# Patient Record
Sex: Female | Born: 1935 | Race: White | Hispanic: No | Smoking: Former smoker
Health system: Southern US, Community
[De-identification: ages and names within clinical notes are randomized; demographics above are authoritative.]

## PROBLEM LIST (undated history)

## (undated) DIAGNOSIS — J449 Chronic obstructive pulmonary disease, unspecified: Secondary | ICD-10-CM

## (undated) DIAGNOSIS — K5090 Crohn's disease, unspecified, without complications: Secondary | ICD-10-CM

## (undated) DIAGNOSIS — D649 Anemia, unspecified: Secondary | ICD-10-CM

## (undated) DIAGNOSIS — J45909 Unspecified asthma, uncomplicated: Secondary | ICD-10-CM

## (undated) DIAGNOSIS — I219 Acute myocardial infarction, unspecified: Secondary | ICD-10-CM

## (undated) DIAGNOSIS — K279 Peptic ulcer, site unspecified, unspecified as acute or chronic, without hemorrhage or perforation: Secondary | ICD-10-CM

## (undated) DIAGNOSIS — I509 Heart failure, unspecified: Secondary | ICD-10-CM

## (undated) DIAGNOSIS — I10 Essential (primary) hypertension: Secondary | ICD-10-CM

## (undated) DIAGNOSIS — F32A Depression, unspecified: Secondary | ICD-10-CM

## (undated) DIAGNOSIS — E785 Hyperlipidemia, unspecified: Secondary | ICD-10-CM

## (undated) DIAGNOSIS — R112 Nausea with vomiting, unspecified: Secondary | ICD-10-CM

## (undated) DIAGNOSIS — F329 Major depressive disorder, single episode, unspecified: Secondary | ICD-10-CM

## (undated) DIAGNOSIS — T4145XA Adverse effect of unspecified anesthetic, initial encounter: Secondary | ICD-10-CM

## (undated) DIAGNOSIS — M1990 Unspecified osteoarthritis, unspecified site: Secondary | ICD-10-CM

## (undated) DIAGNOSIS — T8859XA Other complications of anesthesia, initial encounter: Secondary | ICD-10-CM

## (undated) DIAGNOSIS — M8580 Other specified disorders of bone density and structure, unspecified site: Secondary | ICD-10-CM

## (undated) DIAGNOSIS — Z9889 Other specified postprocedural states: Secondary | ICD-10-CM

## (undated) DIAGNOSIS — J189 Pneumonia, unspecified organism: Secondary | ICD-10-CM

## (undated) HISTORY — DX: Pneumonia, unspecified organism: J18.9

## (undated) HISTORY — PX: EYE SURGERY: SHX253

## (undated) HISTORY — DX: Unspecified asthma, uncomplicated: J45.909

## (undated) HISTORY — DX: Unspecified osteoarthritis, unspecified site: M19.90

## (undated) HISTORY — DX: Crohn's disease, unspecified, without complications: K50.90

## (undated) HISTORY — PX: SHOULDER SURGERY: SHX246

## (undated) HISTORY — PX: ABDOMINAL SURGERY: SHX537

## (undated) HISTORY — PX: SMALL INTESTINE SURGERY: SHX150

## (undated) HISTORY — DX: Chronic obstructive pulmonary disease, unspecified: J44.9

## (undated) HISTORY — PX: CARDIAC CATHETERIZATION: SHX172

## (undated) HISTORY — DX: Heart failure, unspecified: I50.9

---

## 2004-02-06 ENCOUNTER — Ambulatory Visit: Payer: Self-pay | Admitting: Rheumatology

## 2004-05-05 ENCOUNTER — Other Ambulatory Visit: Payer: Self-pay

## 2004-05-05 ENCOUNTER — Ambulatory Visit: Payer: Self-pay | Admitting: General Practice

## 2004-05-08 ENCOUNTER — Ambulatory Visit: Payer: Self-pay | Admitting: General Practice

## 2004-05-09 ENCOUNTER — Ambulatory Visit: Payer: Self-pay | Admitting: Internal Medicine

## 2004-05-12 ENCOUNTER — Inpatient Hospital Stay: Payer: Self-pay | Admitting: General Practice

## 2004-08-31 ENCOUNTER — Other Ambulatory Visit: Payer: Self-pay

## 2004-08-31 ENCOUNTER — Inpatient Hospital Stay: Payer: Self-pay | Admitting: Internal Medicine

## 2004-11-17 ENCOUNTER — Ambulatory Visit: Payer: Self-pay | Admitting: Internal Medicine

## 2004-12-02 ENCOUNTER — Ambulatory Visit: Payer: Self-pay | Admitting: Obstetrics and Gynecology

## 2005-12-03 ENCOUNTER — Ambulatory Visit: Payer: Self-pay | Admitting: Obstetrics and Gynecology

## 2006-03-05 ENCOUNTER — Ambulatory Visit: Payer: Self-pay | Admitting: Gastroenterology

## 2006-06-09 ENCOUNTER — Observation Stay: Payer: Self-pay | Admitting: Gastroenterology

## 2007-03-30 ENCOUNTER — Other Ambulatory Visit: Payer: Self-pay

## 2007-03-31 ENCOUNTER — Inpatient Hospital Stay: Payer: Self-pay | Admitting: Internal Medicine

## 2007-05-27 ENCOUNTER — Inpatient Hospital Stay: Payer: Self-pay | Admitting: Internal Medicine

## 2008-02-06 ENCOUNTER — Ambulatory Visit: Payer: Self-pay | Admitting: Internal Medicine

## 2008-03-15 ENCOUNTER — Ambulatory Visit: Payer: Self-pay | Admitting: Internal Medicine

## 2008-10-10 ENCOUNTER — Ambulatory Visit: Payer: Self-pay | Admitting: Gastroenterology

## 2009-10-14 ENCOUNTER — Ambulatory Visit: Payer: Self-pay | Admitting: Specialist

## 2009-10-17 ENCOUNTER — Ambulatory Visit: Payer: Self-pay | Admitting: Rheumatology

## 2009-10-22 ENCOUNTER — Ambulatory Visit: Payer: Self-pay | Admitting: Gastroenterology

## 2009-10-29 ENCOUNTER — Inpatient Hospital Stay: Payer: Self-pay | Admitting: Internal Medicine

## 2010-04-29 ENCOUNTER — Ambulatory Visit: Payer: Self-pay | Admitting: Specialist

## 2010-05-09 ENCOUNTER — Ambulatory Visit: Payer: Self-pay | Admitting: Obstetrics and Gynecology

## 2010-05-17 ENCOUNTER — Emergency Department: Payer: Self-pay | Admitting: Unknown Physician Specialty

## 2010-05-28 ENCOUNTER — Ambulatory Visit: Payer: Self-pay | Admitting: Internal Medicine

## 2010-07-28 ENCOUNTER — Inpatient Hospital Stay: Payer: Self-pay | Admitting: Internal Medicine

## 2010-12-08 ENCOUNTER — Other Ambulatory Visit: Payer: Self-pay | Admitting: Gastroenterology

## 2011-01-13 ENCOUNTER — Ambulatory Visit: Payer: Self-pay | Admitting: Rheumatology

## 2011-02-10 ENCOUNTER — Ambulatory Visit: Payer: Self-pay | Admitting: General Practice

## 2011-03-31 ENCOUNTER — Ambulatory Visit: Payer: Self-pay | Admitting: Specialist

## 2011-04-01 LAB — CBC WITH DIFFERENTIAL/PLATELET
Basophil #: 0.1 10*3/uL (ref 0.0–0.1)
Basophil %: 0.9 %
Eosinophil #: 0.2 10*3/uL (ref 0.0–0.7)
Eosinophil %: 2.2 %
HCT: 37.3 % (ref 35.0–47.0)
HGB: 12.3 g/dL (ref 12.0–16.0)
Lymphocyte #: 0.9 10*3/uL — ABNORMAL LOW (ref 1.0–3.6)
Lymphocyte %: 11 %
MCH: 29.2 pg (ref 26.0–34.0)
MCHC: 33 g/dL (ref 32.0–36.0)
MCV: 89 fL (ref 80–100)
Monocyte #: 0.4 10*3/uL (ref 0.0–0.7)
Monocyte %: 4.8 %
Neutrophil #: 7 10*3/uL — ABNORMAL HIGH (ref 1.4–6.5)
Neutrophil %: 81.1 %
Platelet: 267 10*3/uL (ref 150–440)
RBC: 4.22 10*6/uL (ref 3.80–5.20)
RDW: 15.1 % — ABNORMAL HIGH (ref 11.5–14.5)
WBC: 8.6 10*3/uL (ref 3.6–11.0)

## 2011-04-02 ENCOUNTER — Inpatient Hospital Stay: Payer: Self-pay | Admitting: General Practice

## 2011-04-22 ENCOUNTER — Ambulatory Visit: Payer: Self-pay | Admitting: Specialist

## 2011-08-28 ENCOUNTER — Inpatient Hospital Stay: Payer: Self-pay | Admitting: Internal Medicine

## 2011-08-28 LAB — COMPREHENSIVE METABOLIC PANEL
Albumin: 3.1 g/dL — ABNORMAL LOW (ref 3.4–5.0)
Alkaline Phosphatase: 94 U/L (ref 50–136)
Anion Gap: 9 (ref 7–16)
BUN: 37 mg/dL — ABNORMAL HIGH (ref 7–18)
Bilirubin,Total: 0.3 mg/dL (ref 0.2–1.0)
Calcium, Total: 8.6 mg/dL (ref 8.5–10.1)
Chloride: 99 mmol/L (ref 98–107)
Co2: 27 mmol/L (ref 21–32)
Creatinine: 2.36 mg/dL — ABNORMAL HIGH (ref 0.60–1.30)
EGFR (African American): 23 — ABNORMAL LOW
EGFR (Non-African Amer.): 20 — ABNORMAL LOW
Glucose: 115 mg/dL — ABNORMAL HIGH (ref 65–99)
Osmolality: 280 (ref 275–301)
Potassium: 3.6 mmol/L (ref 3.5–5.1)
SGOT(AST): 22 U/L (ref 15–37)
SGPT (ALT): 18 U/L
Sodium: 135 mmol/L — ABNORMAL LOW (ref 136–145)
Total Protein: 7.1 g/dL (ref 6.4–8.2)

## 2011-08-28 LAB — CBC WITH DIFFERENTIAL/PLATELET
Basophil #: 0.2 10*3/uL — ABNORMAL HIGH (ref 0.0–0.1)
Basophil %: 1.8 %
Eosinophil #: 0.2 10*3/uL (ref 0.0–0.7)
Eosinophil %: 1.4 %
HCT: 35.9 % (ref 35.0–47.0)
HGB: 11.5 g/dL — ABNORMAL LOW (ref 12.0–16.0)
Lymphocyte #: 1.7 10*3/uL (ref 1.0–3.6)
Lymphocyte %: 12.1 %
MCH: 28 pg (ref 26.0–34.0)
MCHC: 31.9 g/dL — ABNORMAL LOW (ref 32.0–36.0)
MCV: 88 fL (ref 80–100)
Monocyte #: 1.5 x10 3/mm — ABNORMAL HIGH (ref 0.2–0.9)
Monocyte %: 10.9 %
Neutrophil #: 10.2 10*3/uL — ABNORMAL HIGH (ref 1.4–6.5)
Neutrophil %: 73.8 %
Platelet: 333 10*3/uL (ref 150–440)
RBC: 4.09 10*6/uL (ref 3.80–5.20)
RDW: 15 % — ABNORMAL HIGH (ref 11.5–14.5)
WBC: 13.9 10*3/uL — ABNORMAL HIGH (ref 3.6–11.0)

## 2011-08-28 LAB — URINALYSIS, COMPLETE
Bacteria: NONE SEEN
Bilirubin,UR: NEGATIVE
Blood: NEGATIVE
Glucose,UR: NEGATIVE mg/dL (ref 0–75)
Granular Cast: 6
Ketone: NEGATIVE
Leukocyte Esterase: NEGATIVE
Nitrite: NEGATIVE
Ph: 5 (ref 4.5–8.0)
Protein: NEGATIVE
RBC,UR: NONE SEEN /HPF (ref 0–5)
Specific Gravity: 1.01 (ref 1.003–1.030)
Squamous Epithelial: 1
WBC UR: 1 /HPF (ref 0–5)

## 2011-08-28 LAB — LIPASE, BLOOD: Lipase: 85 U/L (ref 73–393)

## 2011-08-29 LAB — BASIC METABOLIC PANEL
Anion Gap: 9 (ref 7–16)
BUN: 29 mg/dL — ABNORMAL HIGH (ref 7–18)
Calcium, Total: 8.2 mg/dL — ABNORMAL LOW (ref 8.5–10.1)
Chloride: 102 mmol/L (ref 98–107)
Co2: 26 mmol/L (ref 21–32)
Creatinine: 1.69 mg/dL — ABNORMAL HIGH (ref 0.60–1.30)
EGFR (African American): 34 — ABNORMAL LOW
EGFR (Non-African Amer.): 29 — ABNORMAL LOW
Glucose: 135 mg/dL — ABNORMAL HIGH (ref 65–99)
Osmolality: 282 (ref 275–301)
Potassium: 3.7 mmol/L (ref 3.5–5.1)
Sodium: 137 mmol/L (ref 136–145)

## 2011-08-29 LAB — CBC WITH DIFFERENTIAL/PLATELET
Basophil #: 0 10*3/uL (ref 0.0–0.1)
Basophil %: 0.2 %
Eosinophil #: 0 10*3/uL (ref 0.0–0.7)
Eosinophil %: 0.1 %
HCT: 30.3 % — ABNORMAL LOW (ref 35.0–47.0)
HGB: 10 g/dL — ABNORMAL LOW (ref 12.0–16.0)
Lymphocyte #: 0.6 10*3/uL — ABNORMAL LOW (ref 1.0–3.6)
Lymphocyte %: 6.3 %
MCH: 29 pg (ref 26.0–34.0)
MCHC: 32.9 g/dL (ref 32.0–36.0)
MCV: 88 fL (ref 80–100)
Monocyte #: 0.6 x10 3/mm (ref 0.2–0.9)
Monocyte %: 6.6 %
Neutrophil #: 7.7 10*3/uL — ABNORMAL HIGH (ref 1.4–6.5)
Neutrophil %: 86.8 %
Platelet: 290 10*3/uL (ref 150–440)
RBC: 3.44 10*6/uL — ABNORMAL LOW (ref 3.80–5.20)
RDW: 14.5 % (ref 11.5–14.5)
WBC: 8.9 10*3/uL (ref 3.6–11.0)

## 2011-08-29 LAB — MAGNESIUM: Magnesium: 1.8 mg/dL

## 2011-08-30 LAB — BASIC METABOLIC PANEL
Anion Gap: 10 (ref 7–16)
BUN: 13 mg/dL (ref 7–18)
Calcium, Total: 8.1 mg/dL — ABNORMAL LOW (ref 8.5–10.1)
Chloride: 109 mmol/L — ABNORMAL HIGH (ref 98–107)
Co2: 26 mmol/L (ref 21–32)
Creatinine: 0.85 mg/dL (ref 0.60–1.30)
EGFR (African American): >60
EGFR (Non-African Amer.): >60
Glucose: 125 mg/dL — ABNORMAL HIGH (ref 65–99)
Osmolality: 290 (ref 275–301)
Potassium: 3.9 mmol/L (ref 3.5–5.1)
Sodium: 145 mmol/L (ref 136–145)

## 2011-08-30 LAB — CBC WITH DIFFERENTIAL/PLATELET
Basophil #: 0 10*3/uL (ref 0.0–0.1)
Basophil %: 0.1 %
Eosinophil #: 0 10*3/uL (ref 0.0–0.7)
Eosinophil %: 0.1 %
HCT: 28.3 % — ABNORMAL LOW (ref 35.0–47.0)
HGB: 9.1 g/dL — ABNORMAL LOW (ref 12.0–16.0)
Lymphocyte #: 0.8 10*3/uL — ABNORMAL LOW (ref 1.0–3.6)
Lymphocyte %: 10.9 %
MCH: 28.2 pg (ref 26.0–34.0)
MCHC: 32 g/dL (ref 32.0–36.0)
MCV: 88 fL (ref 80–100)
Monocyte #: 0.7 x10 3/mm (ref 0.2–0.9)
Monocyte %: 9.2 %
Neutrophil #: 6 10*3/uL (ref 1.4–6.5)
Neutrophil %: 79.7 %
Platelet: 267 10*3/uL (ref 150–440)
RBC: 3.21 10*6/uL — ABNORMAL LOW (ref 3.80–5.20)
RDW: 14.6 % — ABNORMAL HIGH (ref 11.5–14.5)
WBC: 7.5 10*3/uL (ref 3.6–11.0)

## 2011-08-31 LAB — BASIC METABOLIC PANEL
Anion Gap: 7 (ref 7–16)
BUN: 14 mg/dL (ref 7–18)
Calcium, Total: 8.3 mg/dL — ABNORMAL LOW (ref 8.5–10.1)
Chloride: 111 mmol/L — ABNORMAL HIGH (ref 98–107)
Co2: 26 mmol/L (ref 21–32)
Creatinine: 1.02 mg/dL (ref 0.60–1.30)
EGFR (African American): >60
EGFR (Non-African Amer.): 54 — ABNORMAL LOW
Glucose: 134 mg/dL — ABNORMAL HIGH (ref 65–99)
Osmolality: 289 (ref 275–301)
Potassium: 3.9 mmol/L (ref 3.5–5.1)
Sodium: 144 mmol/L (ref 136–145)

## 2011-08-31 LAB — CBC WITH DIFFERENTIAL/PLATELET
Basophil #: 0 10*3/uL (ref 0.0–0.1)
Basophil %: 0.1 %
Eosinophil #: 0 10*3/uL (ref 0.0–0.7)
Eosinophil %: 0.1 %
HCT: 28.4 % — ABNORMAL LOW (ref 35.0–47.0)
HGB: 9.2 g/dL — ABNORMAL LOW (ref 12.0–16.0)
Lymphocyte #: 0.9 10*3/uL — ABNORMAL LOW (ref 1.0–3.6)
Lymphocyte %: 12.7 %
MCH: 28.6 pg (ref 26.0–34.0)
MCHC: 32.4 g/dL (ref 32.0–36.0)
MCV: 88 fL (ref 80–100)
Monocyte #: 0.5 x10 3/mm (ref 0.2–0.9)
Monocyte %: 6.6 %
Neutrophil #: 5.7 10*3/uL (ref 1.4–6.5)
Neutrophil %: 80.5 %
Platelet: 260 10*3/uL (ref 150–440)
RBC: 3.21 10*6/uL — ABNORMAL LOW (ref 3.80–5.20)
RDW: 14.5 % (ref 11.5–14.5)
WBC: 7.1 10*3/uL (ref 3.6–11.0)

## 2011-09-02 LAB — CULTURE, BLOOD (SINGLE)

## 2011-09-25 ENCOUNTER — Other Ambulatory Visit: Payer: Self-pay | Admitting: Internal Medicine

## 2011-10-01 LAB — CULTURE, BLOOD (SINGLE)

## 2012-01-13 ENCOUNTER — Ambulatory Visit: Payer: Self-pay | Admitting: Gastroenterology

## 2012-01-14 LAB — PATHOLOGY REPORT

## 2012-04-01 ENCOUNTER — Emergency Department: Payer: Self-pay | Admitting: Emergency Medicine

## 2012-04-01 LAB — COMPREHENSIVE METABOLIC PANEL
Albumin: 3.7 g/dL (ref 3.4–5.0)
Alkaline Phosphatase: 109 U/L (ref 50–136)
Anion Gap: 9 (ref 7–16)
BUN: 17 mg/dL (ref 7–18)
Bilirubin,Total: 0.3 mg/dL (ref 0.2–1.0)
Calcium, Total: 9.1 mg/dL (ref 8.5–10.1)
Chloride: 105 mmol/L (ref 98–107)
Co2: 25 mmol/L (ref 21–32)
Creatinine: 0.78 mg/dL (ref 0.60–1.30)
EGFR (African American): >60
EGFR (Non-African Amer.): >60
Glucose: 145 mg/dL — ABNORMAL HIGH (ref 65–99)
Osmolality: 282 (ref 275–301)
Potassium: 3.7 mmol/L (ref 3.5–5.1)
SGOT(AST): 19 U/L (ref 15–37)
SGPT (ALT): 24 U/L (ref 12–78)
Sodium: 139 mmol/L (ref 136–145)
Total Protein: 7.3 g/dL (ref 6.4–8.2)

## 2012-04-01 LAB — CBC WITH DIFFERENTIAL/PLATELET
Basophil #: 0.1 10*3/uL (ref 0.0–0.1)
Basophil %: 0.5 %
Eosinophil #: 0.1 10*3/uL (ref 0.0–0.7)
Eosinophil %: 0.7 %
HCT: 43.2 % (ref 35.0–47.0)
HGB: 14.3 g/dL (ref 12.0–16.0)
Lymphocyte #: 0.9 10*3/uL — ABNORMAL LOW (ref 1.0–3.6)
Lymphocyte %: 7 %
MCH: 28.6 pg (ref 26.0–34.0)
MCHC: 33 g/dL (ref 32.0–36.0)
MCV: 87 fL (ref 80–100)
Monocyte #: 1 x10 3/mm — ABNORMAL HIGH (ref 0.2–0.9)
Monocyte %: 7.9 %
Neutrophil #: 10.9 10*3/uL — ABNORMAL HIGH (ref 1.4–6.5)
Neutrophil %: 83.9 %
Platelet: 266 10*3/uL (ref 150–440)
RBC: 5 10*6/uL (ref 3.80–5.20)
RDW: 15.2 % — ABNORMAL HIGH (ref 11.5–14.5)
WBC: 13 10*3/uL — ABNORMAL HIGH (ref 3.6–11.0)

## 2012-04-01 LAB — TROPONIN I: Troponin-I: <0.02 ng/mL

## 2012-04-01 LAB — LIPASE, BLOOD: Lipase: 90 U/L (ref 73–393)

## 2012-04-28 ENCOUNTER — Ambulatory Visit: Payer: Self-pay | Admitting: Surgery

## 2012-05-06 ENCOUNTER — Inpatient Hospital Stay: Payer: Self-pay | Admitting: Surgery

## 2012-05-07 LAB — PLATELET COUNT: Platelet: 198 10*3/uL (ref 150–440)

## 2012-05-08 LAB — HEMOGLOBIN: HGB: 12.5 g/dL (ref 12.0–16.0)

## 2012-05-09 LAB — PATHOLOGY REPORT

## 2012-05-12 LAB — BASIC METABOLIC PANEL
Anion Gap: 6 — ABNORMAL LOW (ref 7–16)
BUN: 15 mg/dL (ref 7–18)
Calcium, Total: 8.2 mg/dL — ABNORMAL LOW (ref 8.5–10.1)
Chloride: 102 mmol/L (ref 98–107)
Co2: 25 mmol/L (ref 21–32)
Creatinine: 1.47 mg/dL — ABNORMAL HIGH (ref 0.60–1.30)
EGFR (African American): 40 — ABNORMAL LOW
EGFR (Non-African Amer.): 34 — ABNORMAL LOW
Glucose: 135 mg/dL — ABNORMAL HIGH (ref 65–99)
Osmolality: 269 (ref 275–301)
Potassium: 4 mmol/L (ref 3.5–5.1)
Sodium: 133 mmol/L — ABNORMAL LOW (ref 136–145)

## 2012-05-12 LAB — PLATELET COUNT: Platelet: 258 10*3/uL (ref 150–440)

## 2012-05-12 LAB — CBC WITH DIFFERENTIAL/PLATELET
Bands: 3 %
Basophil: 1 %
Eosinophil: 1 %
HCT: 35.4 % (ref 35.0–47.0)
HGB: 11.8 g/dL — ABNORMAL LOW (ref 12.0–16.0)
Lymphocytes: 18 %
MCH: 29.4 pg (ref 26.0–34.0)
MCHC: 33.3 g/dL (ref 32.0–36.0)
MCV: 88 fL (ref 80–100)
Metamyelocyte: 4 %
Monocytes: 6 %
Myelocyte: 3 %
RBC: 4.01 10*6/uL (ref 3.80–5.20)
RDW: 15.1 % — ABNORMAL HIGH (ref 11.5–14.5)
Segmented Neutrophils: 61 %
Variant Lymphocyte - H1-Rlymph: 3 %
WBC: 11.5 10*3/uL — ABNORMAL HIGH (ref 3.6–11.0)

## 2012-05-13 LAB — CBC WITH DIFFERENTIAL/PLATELET
Bands: 1 %
HCT: 35.1 % (ref 35.0–47.0)
HGB: 11.6 g/dL — ABNORMAL LOW (ref 12.0–16.0)
Lymphocytes: 15 %
MCH: 28.9 pg (ref 26.0–34.0)
MCHC: 33.1 g/dL (ref 32.0–36.0)
MCV: 87 fL (ref 80–100)
Metamyelocyte: 3 %
Monocytes: 7 %
Platelet: 241 10*3/uL (ref 150–440)
RBC: 4.03 10*6/uL (ref 3.80–5.20)
RDW: 15.5 % — ABNORMAL HIGH (ref 11.5–14.5)
Segmented Neutrophils: 74 %
WBC: 13.8 10*3/uL — ABNORMAL HIGH (ref 3.6–11.0)

## 2012-05-15 LAB — BASIC METABOLIC PANEL
Anion Gap: 9 (ref 7–16)
BUN: 10 mg/dL (ref 7–18)
Calcium, Total: 8 mg/dL — ABNORMAL LOW (ref 8.5–10.1)
Chloride: 98 mmol/L (ref 98–107)
Co2: 23 mmol/L (ref 21–32)
Creatinine: 1.32 mg/dL — ABNORMAL HIGH (ref 0.60–1.30)
EGFR (African American): 45 — ABNORMAL LOW
EGFR (Non-African Amer.): 39 — ABNORMAL LOW
Glucose: 119 mg/dL — ABNORMAL HIGH (ref 65–99)
Osmolality: 261 (ref 275–301)
Potassium: 4.6 mmol/L (ref 3.5–5.1)
Sodium: 130 mmol/L — ABNORMAL LOW (ref 136–145)

## 2012-05-15 LAB — CBC WITH DIFFERENTIAL/PLATELET
Bands: 8 %
Eosinophil: 2 %
HCT: 32.8 % — ABNORMAL LOW (ref 35.0–47.0)
HGB: 11.1 g/dL — ABNORMAL LOW (ref 12.0–16.0)
Lymphocytes: 10 %
MCH: 29.7 pg (ref 26.0–34.0)
MCHC: 33.7 g/dL (ref 32.0–36.0)
MCV: 88 fL (ref 80–100)
Metamyelocyte: 2 %
Monocytes: 11 %
Myelocyte: 2 %
Other Cells Blood: 1
Platelet: 258 10*3/uL (ref 150–440)
RBC: 3.73 10*6/uL — ABNORMAL LOW (ref 3.80–5.20)
RDW: 15.1 % — ABNORMAL HIGH (ref 11.5–14.5)
Segmented Neutrophils: 59 %
Variant Lymphocyte - H1-Rlymph: 5 %
WBC: 10.4 10*3/uL (ref 3.6–11.0)

## 2012-05-15 LAB — CLOSTRIDIUM DIFFICILE BY PCR

## 2012-05-16 LAB — CBC WITH DIFFERENTIAL/PLATELET
Bands: 6 %
HCT: 32 % — ABNORMAL LOW (ref 35.0–47.0)
HGB: 10.6 g/dL — ABNORMAL LOW (ref 12.0–16.0)
Lymphocytes: 18 %
MCH: 29 pg (ref 26.0–34.0)
MCHC: 33.2 g/dL (ref 32.0–36.0)
MCV: 88 fL (ref 80–100)
Metamyelocyte: 2 %
Monocytes: 11 %
Myelocyte: 2 %
Platelet: 274 10*3/uL (ref 150–440)
RBC: 3.65 10*6/uL — ABNORMAL LOW (ref 3.80–5.20)
RDW: 14.9 % — ABNORMAL HIGH (ref 11.5–14.5)
Segmented Neutrophils: 59 %
Variant Lymphocyte - H1-Rlymph: 2 %
WBC: 8.3 10*3/uL (ref 3.6–11.0)

## 2012-05-18 LAB — BASIC METABOLIC PANEL
Anion Gap: 7 (ref 7–16)
BUN: 12 mg/dL (ref 7–18)
Calcium, Total: 8.3 mg/dL — ABNORMAL LOW (ref 8.5–10.1)
Chloride: 101 mmol/L (ref 98–107)
Co2: 25 mmol/L (ref 21–32)
Creatinine: 1.51 mg/dL — ABNORMAL HIGH (ref 0.60–1.30)
EGFR (African American): 39 — ABNORMAL LOW
EGFR (Non-African Amer.): 33 — ABNORMAL LOW
Glucose: 95 mg/dL (ref 65–99)
Osmolality: 266 (ref 275–301)
Potassium: 4 mmol/L (ref 3.5–5.1)
Sodium: 133 mmol/L — ABNORMAL LOW (ref 136–145)

## 2012-10-19 ENCOUNTER — Other Ambulatory Visit: Payer: Self-pay | Admitting: Specialist

## 2012-10-20 ENCOUNTER — Ambulatory Visit: Payer: Self-pay | Admitting: Specialist

## 2013-03-14 ENCOUNTER — Ambulatory Visit: Payer: Self-pay

## 2013-03-29 ENCOUNTER — Ambulatory Visit: Payer: Self-pay | Admitting: Internal Medicine

## 2013-04-01 LAB — BRONCHIAL WASH CULTURE

## 2013-04-19 LAB — CULTURE, FUNGUS WITHOUT SMEAR

## 2013-05-22 ENCOUNTER — Encounter: Payer: Self-pay | Admitting: Podiatry

## 2013-05-22 ENCOUNTER — Ambulatory Visit (INDEPENDENT_AMBULATORY_CARE_PROVIDER_SITE_OTHER): Payer: Medicare Other | Admitting: Podiatry

## 2013-05-22 ENCOUNTER — Ambulatory Visit (INDEPENDENT_AMBULATORY_CARE_PROVIDER_SITE_OTHER): Payer: Medicare Other

## 2013-05-22 VITALS — BP 135/82 | HR 103 | Resp 16 | Ht 60.0 in | Wt 173.0 lb

## 2013-05-22 DIAGNOSIS — M79609 Pain in unspecified limb: Secondary | ICD-10-CM

## 2013-05-22 DIAGNOSIS — M778 Other enthesopathies, not elsewhere classified: Secondary | ICD-10-CM

## 2013-05-22 DIAGNOSIS — M79673 Pain in unspecified foot: Secondary | ICD-10-CM

## 2013-05-22 DIAGNOSIS — M7750 Other enthesopathy of unspecified foot: Secondary | ICD-10-CM

## 2013-05-22 DIAGNOSIS — M722 Plantar fascial fibromatosis: Secondary | ICD-10-CM

## 2013-05-22 DIAGNOSIS — M779 Enthesopathy, unspecified: Secondary | ICD-10-CM

## 2013-05-22 NOTE — Progress Notes (Signed)
   Subjective:    Patient ID: Raven [name], female    DOB: [birth year], 79 y.o.   MRN: [id number]  HPI Comments: i have right heel pain. Its been going on for 3 -4 weeks, but its been off and on for 12-14 yrs. The pain has gotten worse and it hurts to walk. i rub it with arthritis cream.   i have athletes foot on my right foot only. Its been off and on for 21 yrs. i use athletes foot spray and cortisone cream. It goes away and comes back.   i have pain on the ball of my left foot. It hurts when i walk. Its a sharp pain. The pain has been going on for the last couple of weeks. i dont do anything for my left foot.  Foot Pain      Review of Systems  Constitutional: Negative.   HENT: Negative.   Eyes: Negative.   Respiratory: Positive for shortness of breath and wheezing.   Cardiovascular: Positive for leg swelling.       Calf pain when walking  Gastrointestinal: Negative.   Endocrine: Negative.   Genitourinary: Negative.   Musculoskeletal:       Joint pain Difficulty walking Muscle pain  Skin:       Chang in nails  Allergic/Immunologic: Negative.   Neurological: Negative.   Hematological: Negative.   Psychiatric/Behavioral: Negative.        Objective:   Physical Exam: I have reviewed her past history medications allergies surgeries social history. Vital signs are stable she is alert and oriented x3. Pulses are strongly palpable bilateral. Neurologic sensorium is intact per since once the monofilament. Deep tendon reflexes are intact bilateral muscle strength is 5 over 5 dorsiflexors plantar fasciitis inverters everters all intrinsic musculature is intact orthopedic evaluation Mr. is all joints distal to the ankle a full range of motion without crepitus. She has pain on palpation medial continued tubercle of the right heel. And pain on palpation with end range of motion of the second metatarsophalangeal joint of the left foot. Radiographic evaluation does demonstrate a soft tissue  increase in density at the plantar fascial calcaneal insertion site of the right foot. Plantar distally oriented calcaneal heel spurs noted small retrocalcaneal heel spurs also noted with a thickening of the tendo Achilles right. Left foot does not demonstrate any type of osseous abnormalities forefoot. No fractures noted.        Assessment & Plan:  Assessment: Capsulitis of the second metatarsophalangeal joint left. Plantar fasciitis right foot.  Plan: Injected the right heel today with Kenalog and local anesthetic and dispensed a night splint. Injected periarticular with Kenalog and local anesthetic to the second metatarsophalangeal joint today I will followup with her as needed.

## 2013-06-19 ENCOUNTER — Ambulatory Visit: Payer: Medicare Other | Admitting: Podiatry

## 2013-07-10 ENCOUNTER — Ambulatory Visit (INDEPENDENT_AMBULATORY_CARE_PROVIDER_SITE_OTHER): Payer: Medicare Other | Admitting: Podiatry

## 2013-07-10 ENCOUNTER — Encounter: Payer: Self-pay | Admitting: Podiatry

## 2013-07-10 VITALS — BP 125/68 | HR 68 | Resp 16

## 2013-07-10 DIAGNOSIS — G5790 Unspecified mononeuropathy of unspecified lower limb: Secondary | ICD-10-CM

## 2013-07-10 DIAGNOSIS — G5791 Unspecified mononeuropathy of right lower limb: Secondary | ICD-10-CM

## 2013-07-10 DIAGNOSIS — M722 Plantar fascial fibromatosis: Secondary | ICD-10-CM

## 2013-07-10 NOTE — Progress Notes (Signed)
She presents today for followup of neuritis to the plantar aspect at the plantar fascial calcaneal insertion site.  Objective: Pulses are strongly palpable bilateral. She has pain on palpation medial calcaneal tubercle.  Assessment: Plantar fasciitis with Baxter's nerve neuritis.  Plan: Injected third dose of dehydrated alcohol followup with her in 3-4 weeks.

## 2013-07-24 ENCOUNTER — Ambulatory Visit (INDEPENDENT_AMBULATORY_CARE_PROVIDER_SITE_OTHER): Payer: Medicare Other | Admitting: Podiatry

## 2013-07-24 VITALS — BP 151/86 | HR 92 | Resp 16

## 2013-07-24 DIAGNOSIS — G5791 Unspecified mononeuropathy of right lower limb: Secondary | ICD-10-CM

## 2013-07-24 DIAGNOSIS — G5790 Unspecified mononeuropathy of unspecified lower limb: Secondary | ICD-10-CM

## 2013-07-24 DIAGNOSIS — M722 Plantar fascial fibromatosis: Secondary | ICD-10-CM

## 2013-07-24 NOTE — Progress Notes (Signed)
She presents today for her second dehydrated alcohol injection right foot. She states it seems to be doing a little better. Still having a lot of problems with it.  Objective: Vital signs are stable she is alert and oriented x3. Pulses are palpable right. Pain on palpation medial continued tubercle of the right heel.  Assessment: Pain in limb secondary to Baxter's nerve and neuritis with plantar fasciitis right foot.  Plan: Reinjected her second dose of dehydrated alcohol today I will followup with her in 3-4 weeks for her third dose.

## 2013-07-26 ENCOUNTER — Ambulatory Visit: Payer: Self-pay | Admitting: Specialist

## 2013-08-21 ENCOUNTER — Ambulatory Visit: Payer: Medicare Other | Admitting: Podiatry

## 2013-10-05 ENCOUNTER — Ambulatory Visit: Payer: Self-pay | Admitting: Internal Medicine

## 2013-12-14 ENCOUNTER — Encounter: Payer: Self-pay | Admitting: Cardiology

## 2014-01-07 ENCOUNTER — Encounter: Payer: Self-pay | Admitting: Cardiology

## 2014-02-06 ENCOUNTER — Encounter: Payer: Self-pay | Admitting: Cardiology

## 2014-03-09 ENCOUNTER — Encounter: Payer: Self-pay | Admitting: Cardiology

## 2014-03-12 IMAGING — CR DG CHEST 2V
1 series · 2 of 2 positions shown · non-contrast
Comparison: none

REASON FOR EXAM: cough
COMMENTS:

[Series 1: x chest ap · 0.14mm/px · 2 of 2 slices shown]
[im 1/2]
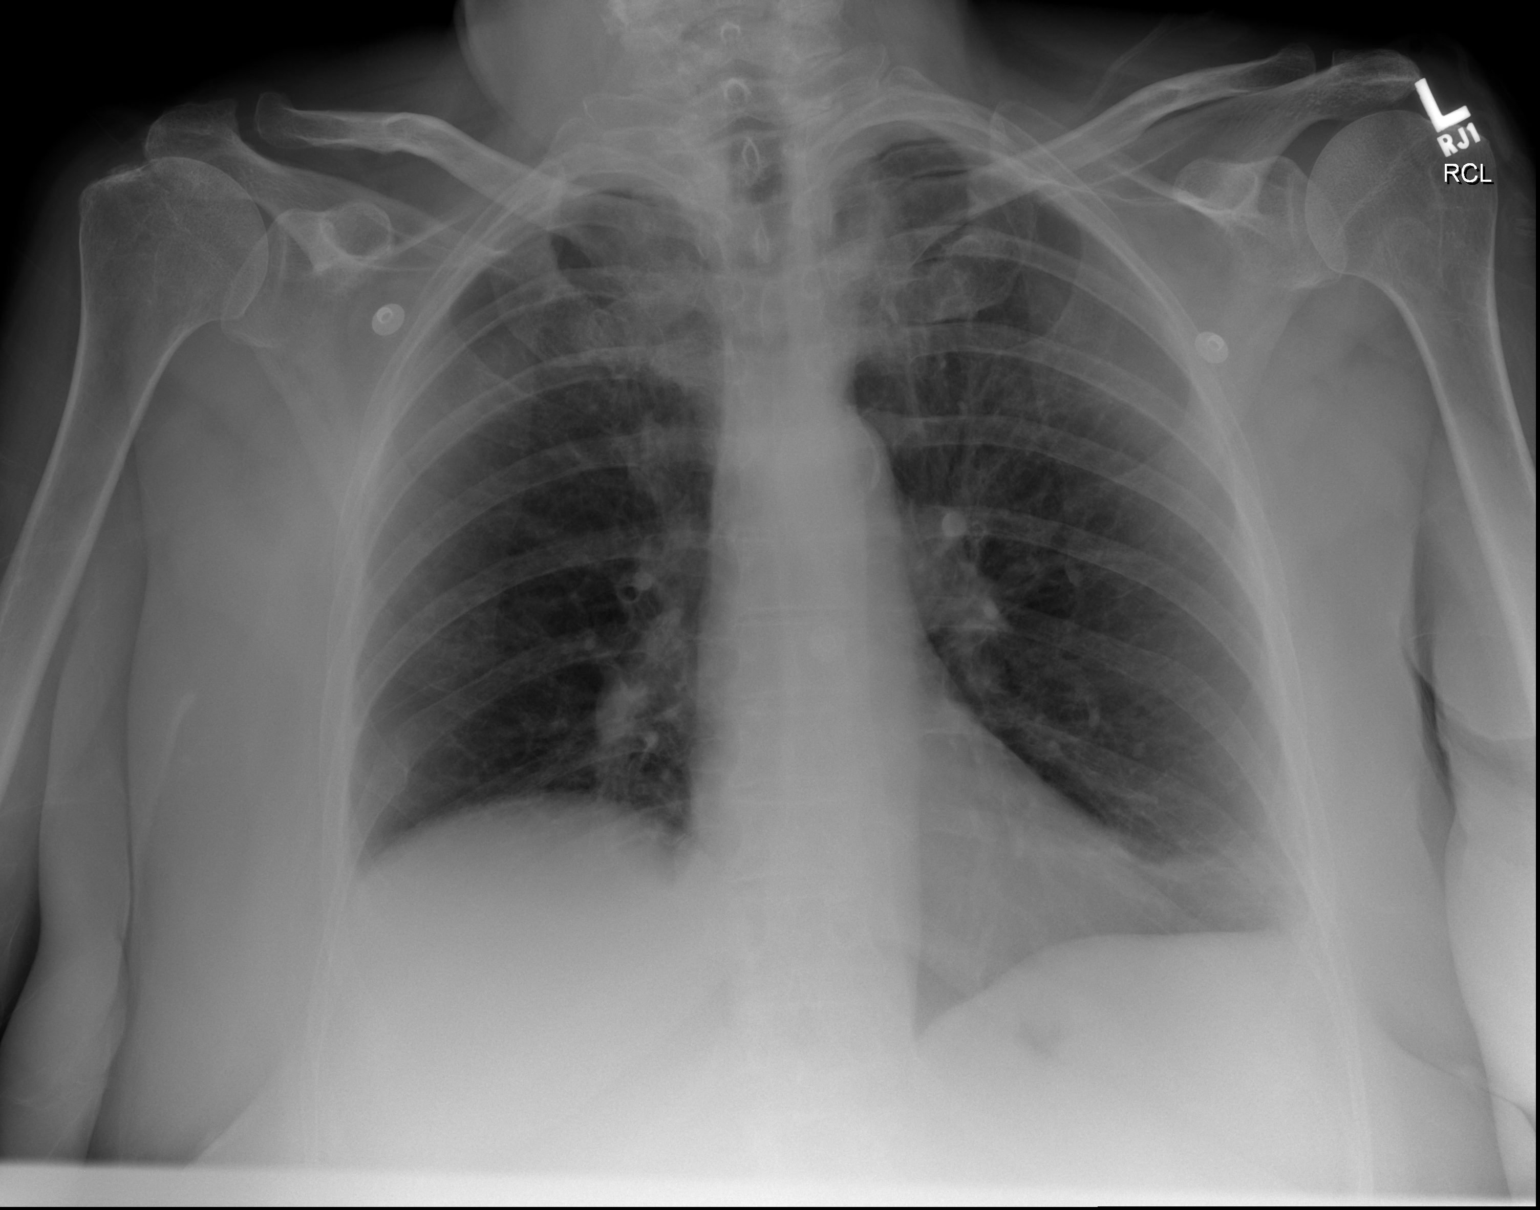
[im 2/2]
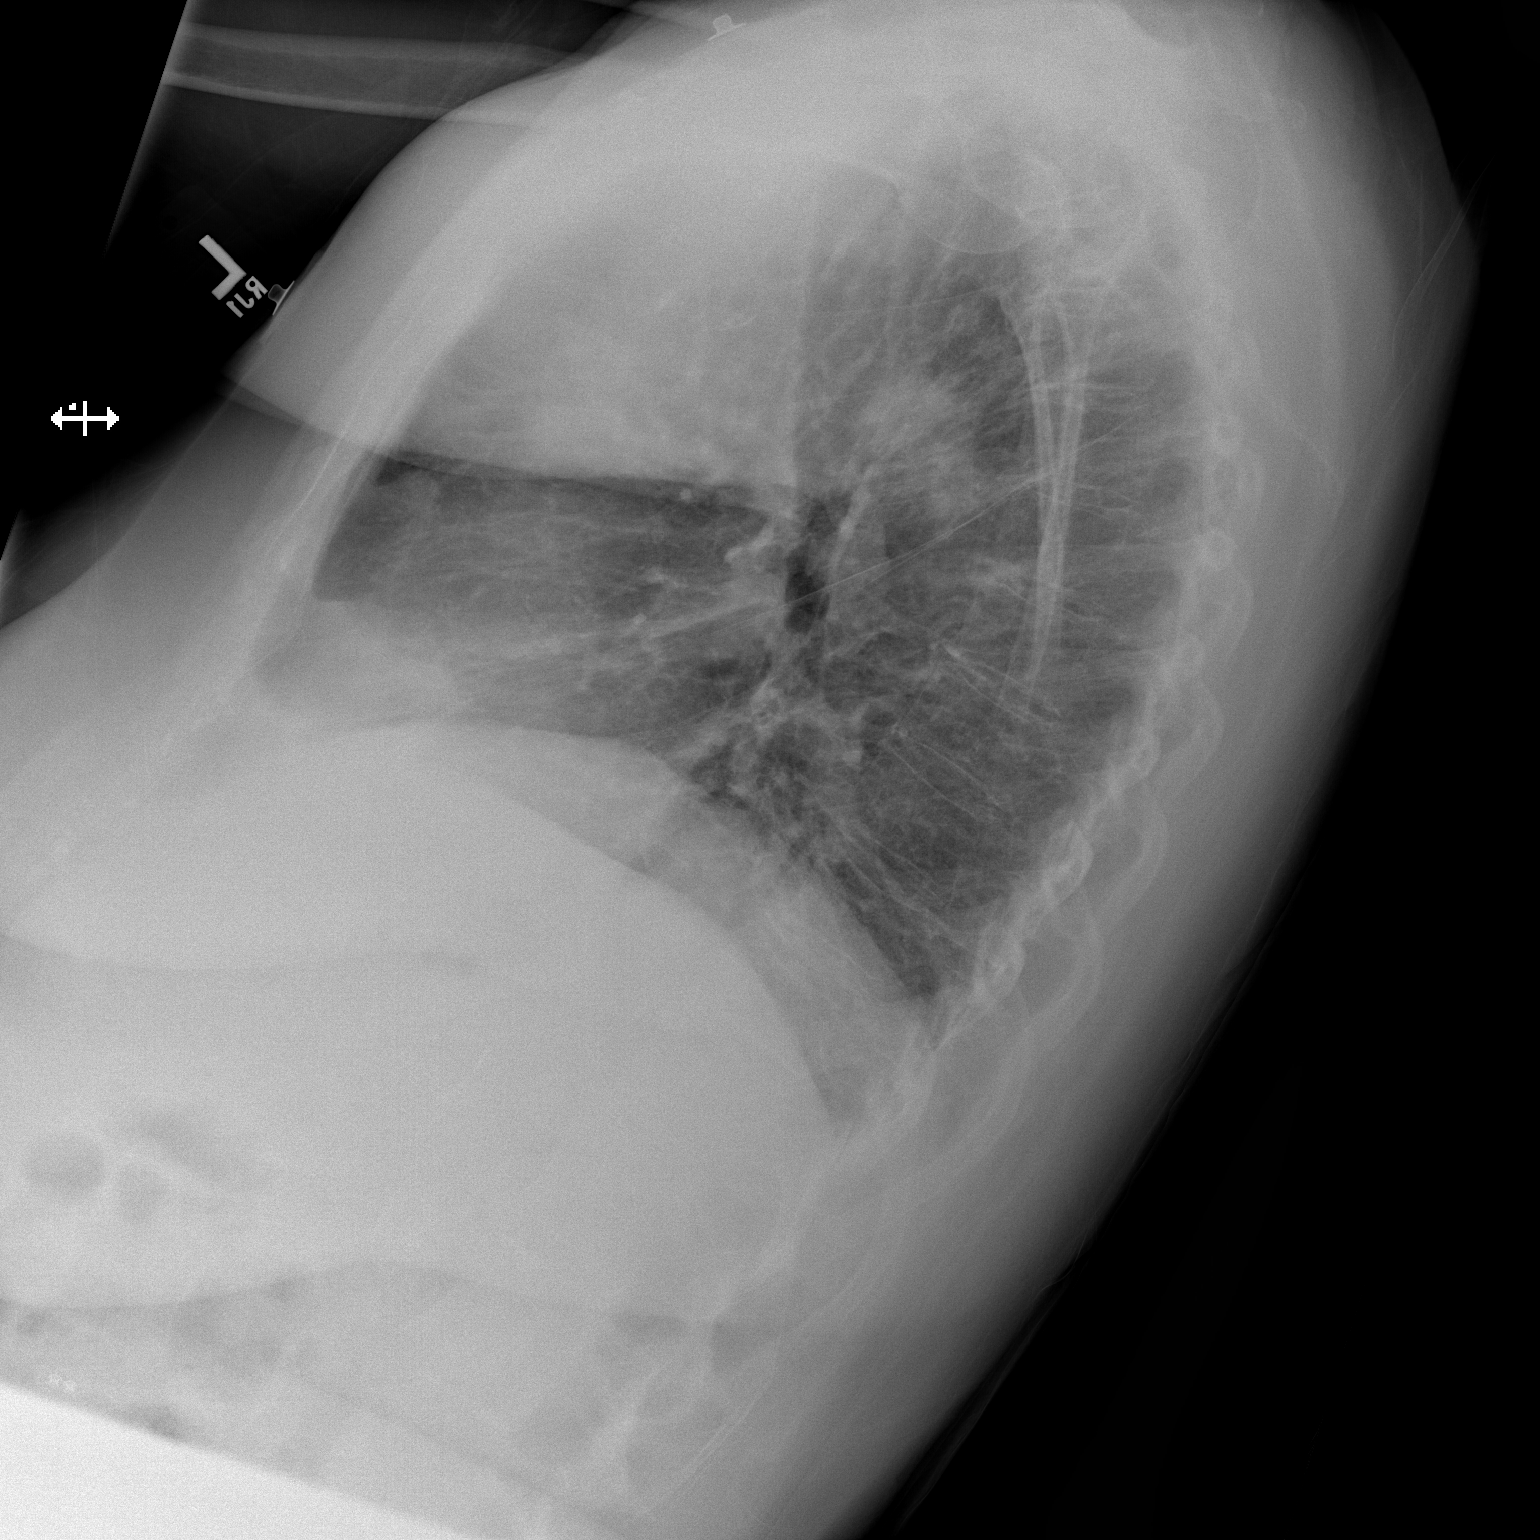

[2 of 2 positions shown; findings below may reference images not displayed]

PROCEDURE:     DXR - DXR CHEST PA (OR AP) AND LATERAL  - August 28, 2011 [DATE]

RESULT:     Comparison is made to the prior exam of 07/28/2010. There is
noted a minimal density in the left lower lung field consistent with
atelectasis or minimal pneumonia in the lingula. The lung fields otherwise
are clear. Heart size is normal. The chest appears bilaterally hyperinflated.
IMPRESSION: There is observed a minimal density in the left lower lung field, most
compatible with atelectasis or pneumonia in the lingula.

[REDACTED]

## 2014-03-12 IMAGING — CT CT ABD-PELV W/O CM
1 of 2 series · 15 of 32 positions shown, 20 images · non-contrast
Comparison: none

REASON FOR EXAM: (1) RLQ tenderness n/v/acute kidney injury; (2) RLQ
tenderness
COMMENTS:

[Series 2: soft tissue · axial · 0.85mm/px · z∈[-736,-265]mm · 15 of 173 slices shown, 20 images]
[im 8/173  soft-tissue]
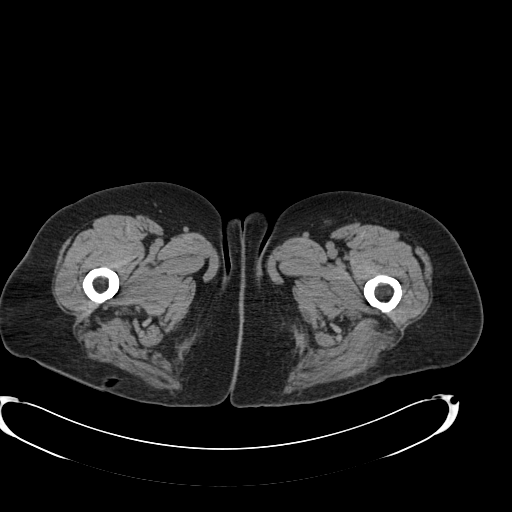
[im 8/173  bone]
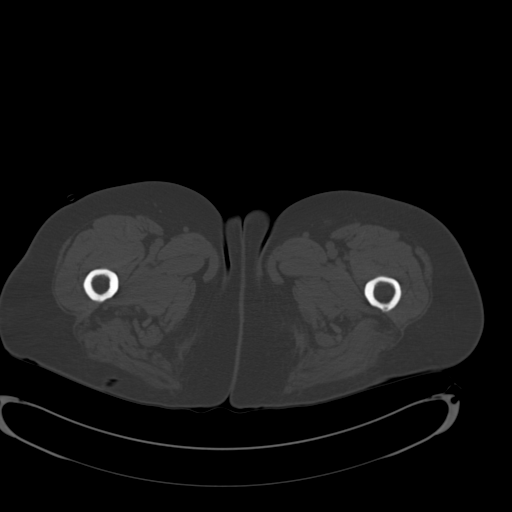
[im 23/173  soft-tissue]
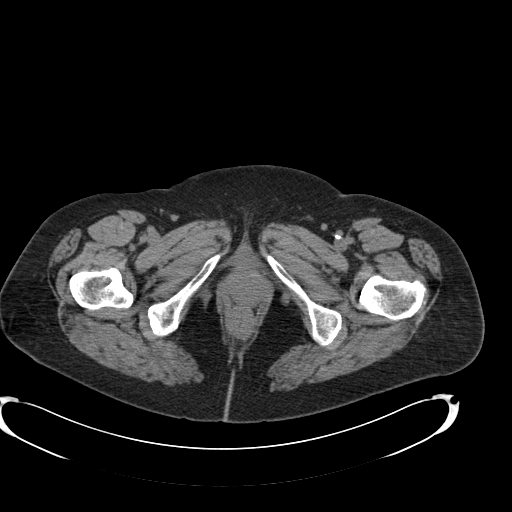
[im 30/173  soft-tissue]
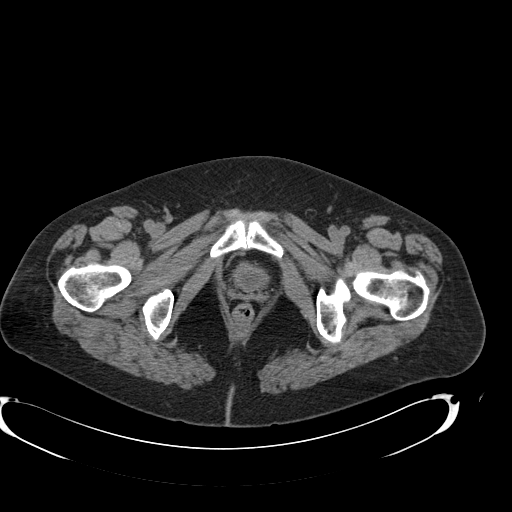
[im 45/173  soft-tissue]
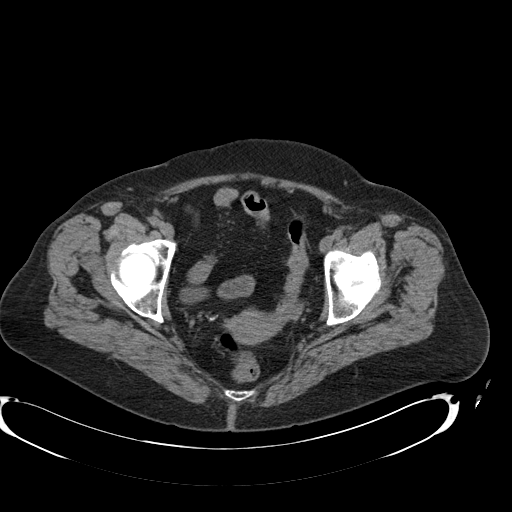
[im 60/173  soft-tissue]
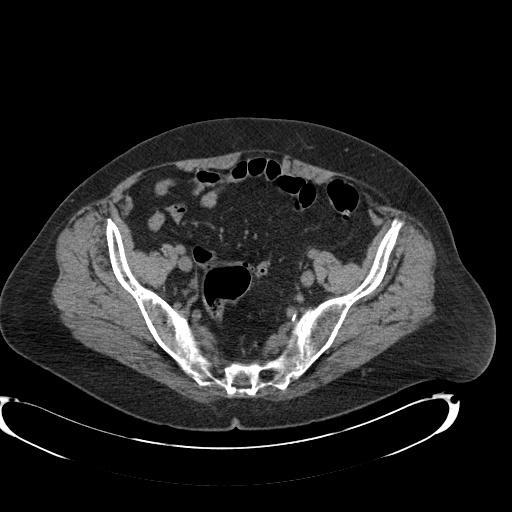
[im 68/173  soft-tissue]
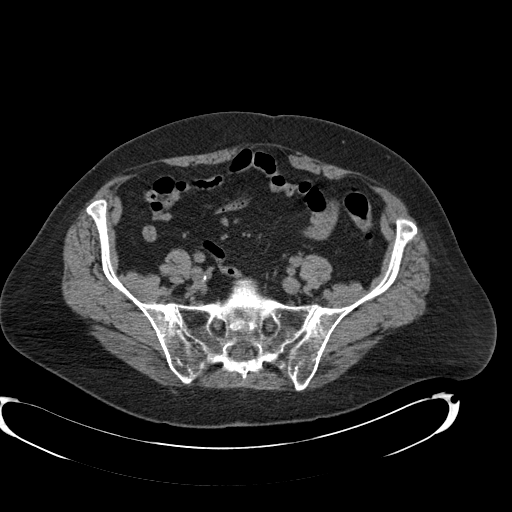
[im 83/173  soft-tissue]
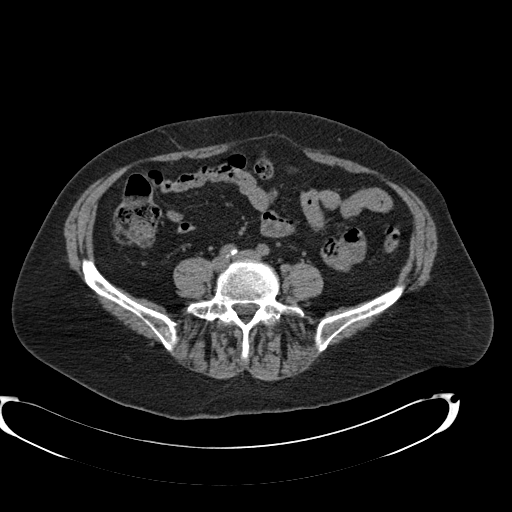
[im 90/173  soft-tissue]
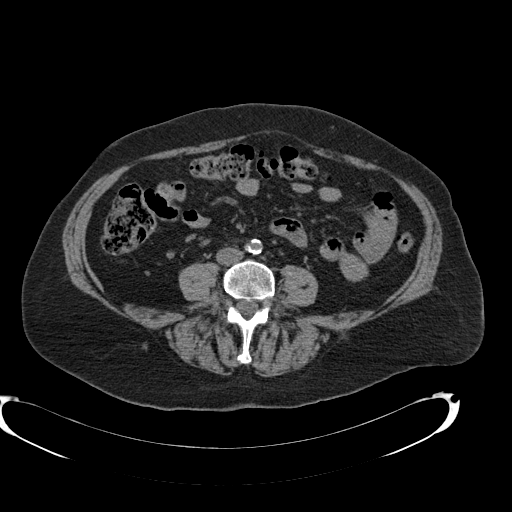
[im 105/173  soft-tissue]
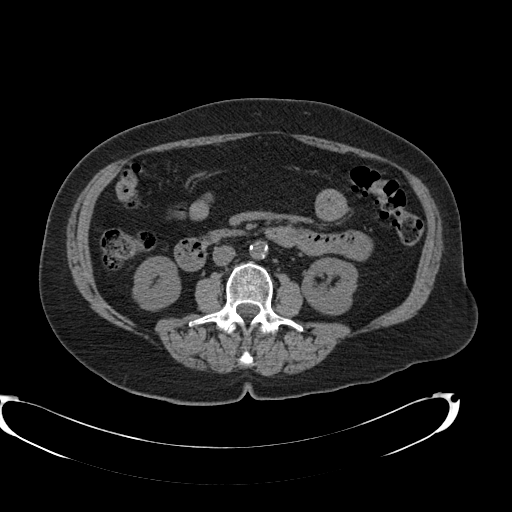
[im 105/173  bone]
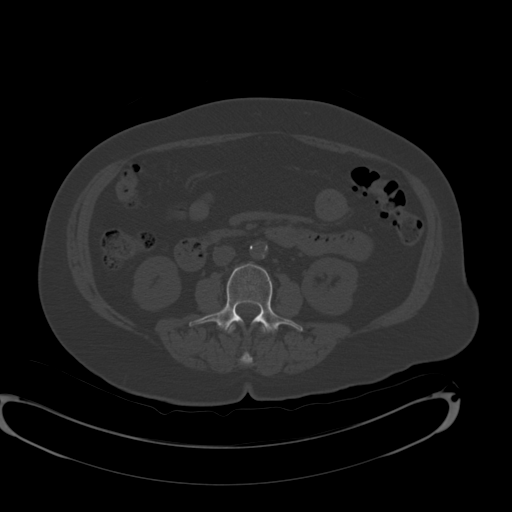
[im 113/173  soft-tissue]
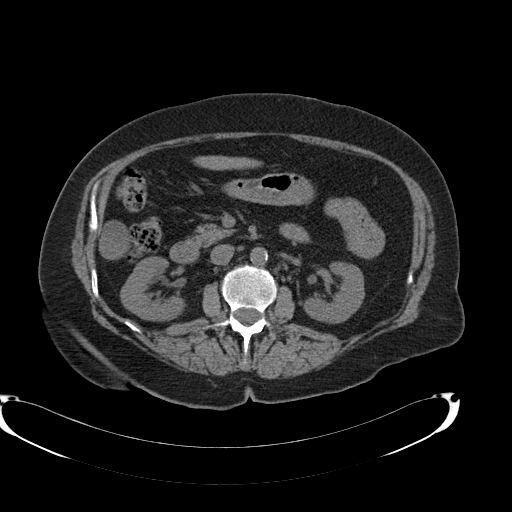
[im 128/173  soft-tissue]
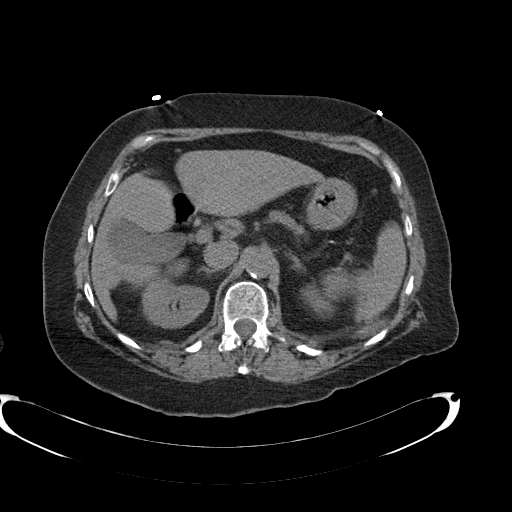
[im 143/173  soft-tissue]
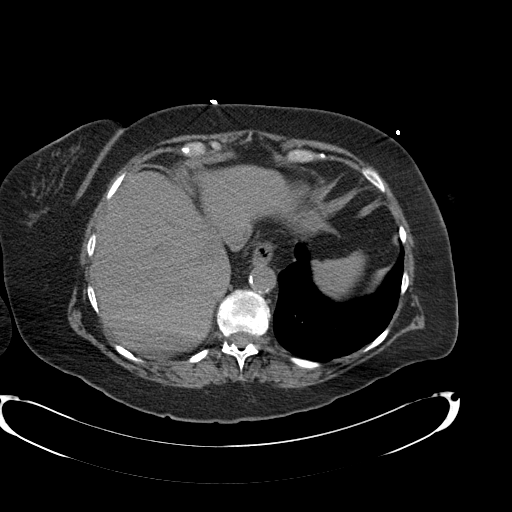
[im 143/173  lung]
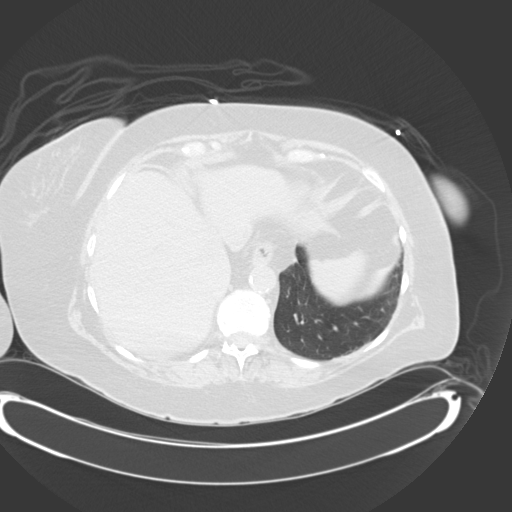
[im 150/173  soft-tissue]
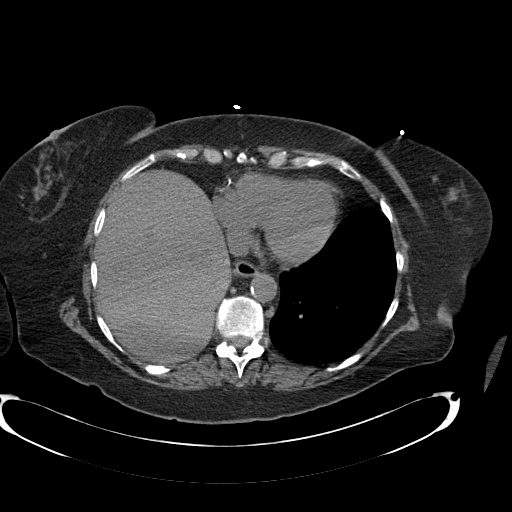
[im 150/173  lung]
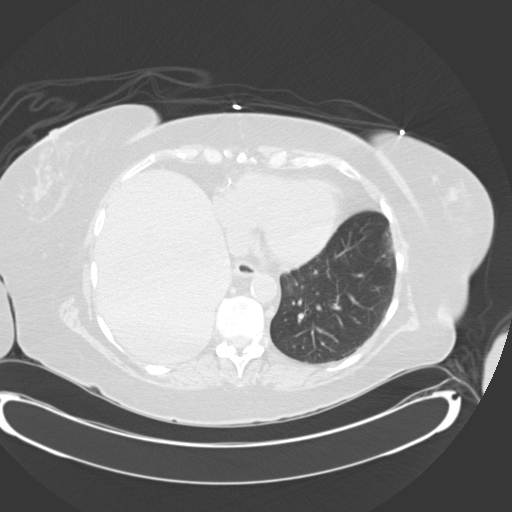
[im 158/173  lung]
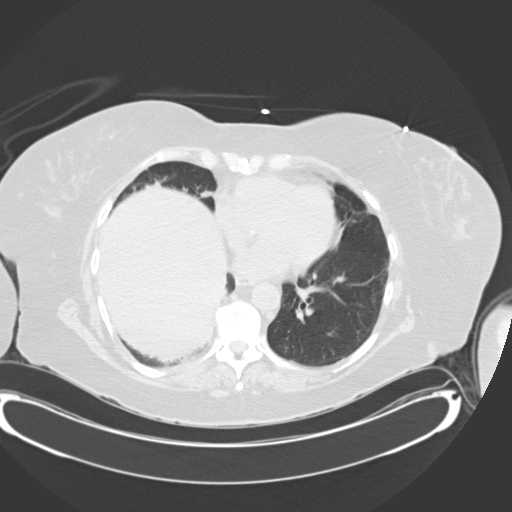
[im 165/173  soft-tissue]
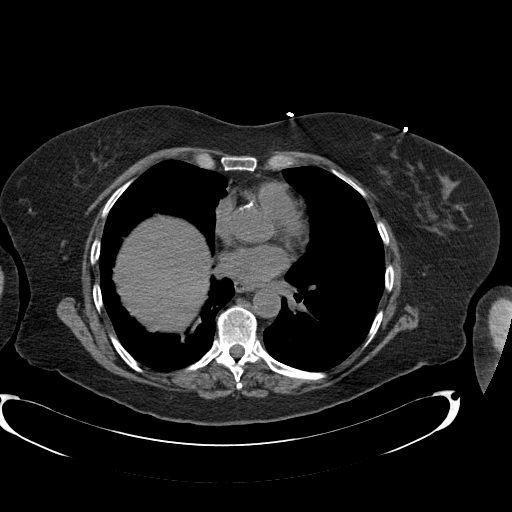
[im 165/173  lung]
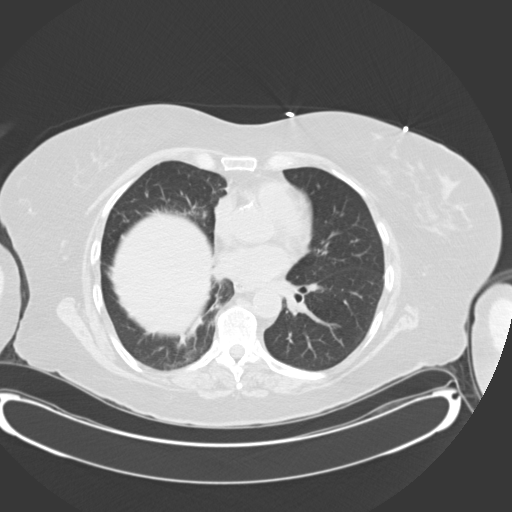

[15 of 32 positions shown; findings below may reference images not displayed]

PROCEDURE:     CT  - CT ABDOMEN AND PELVIS W[DATE]  [DATE]

RESULT:     History: Pain.

Comparison Study: No recent prior. Findings: Standard nonenhanced CT
obtained. Evaluation in 3 dimensions on the second workstation performed. No
bony abnormality appear Coronary artery disease present. Basilar
atelectasis. No free air. Liver normal. Gallbladder nondistended. Pancreas
normal. Spleen normal. Adrenals normal. No hydronephrosis. Phleboliths
noted. Bladder nondistended. Mild diverticulosis noted sigmoid colon mild
fat plane streaking. Mild diverticulitis cannot be excluded. There is no
evidence of bowel obstruction. No inflammatory changes no the right lower
quadrant. Aorta nondistended.
IMPRESSION: Sigmoid colonic diverticulosis. Cannot exclude
diverticulitis.

## 2014-06-08 LAB — CBC WITH DIFFERENTIAL/PLATELET
Basophil #: 0.1 10*3/uL (ref 0.0–0.1)
Basophil %: 0.7 %
Eosinophil #: 0.2 10*3/uL (ref 0.0–0.7)
Eosinophil %: 2 %
HCT: 38.8 % (ref 35.0–47.0)
HGB: 12.8 g/dL (ref 12.0–16.0)
Lymphocyte #: 1.7 10*3/uL (ref 1.0–3.6)
Lymphocyte %: 14.1 %
MCH: 29.2 pg (ref 26.0–34.0)
MCHC: 33 g/dL (ref 32.0–36.0)
MCV: 89 fL (ref 80–100)
Monocyte #: 1.4 x10 3/mm — ABNORMAL HIGH (ref 0.2–0.9)
Monocyte %: 11.4 %
Neutrophil #: 8.7 10*3/uL — ABNORMAL HIGH (ref 1.4–6.5)
Neutrophil %: 71.8 %
Platelet: 176 10*3/uL (ref 150–440)
RBC: 4.38 10*6/uL (ref 3.80–5.20)
RDW: 14.2 % (ref 11.5–14.5)
WBC: 12.1 10*3/uL — ABNORMAL HIGH (ref 3.6–11.0)

## 2014-06-08 LAB — COMPREHENSIVE METABOLIC PANEL
Albumin: 3.6 g/dL
Alkaline Phosphatase: 65 U/L
Anion Gap: 12 (ref 7–16)
BUN: 12 mg/dL
Bilirubin,Total: 1.1 mg/dL
Calcium, Total: 8.6 mg/dL — ABNORMAL LOW
Chloride: 102 mmol/L
Co2: 23 mmol/L
Creatinine: 0.74 mg/dL
EGFR (African American): >60
EGFR (Non-African Amer.): >60
Glucose: 80 mg/dL
Potassium: 2.9 mmol/L — ABNORMAL LOW
SGOT(AST): 17 U/L
SGPT (ALT): 17 U/L
Sodium: 137 mmol/L
Total Protein: 6.5 g/dL

## 2014-06-08 LAB — URINALYSIS, COMPLETE
Bilirubin,UR: NEGATIVE
Blood: NEGATIVE
Glucose,UR: NEGATIVE mg/dL (ref 0–75)
Leukocyte Esterase: NEGATIVE
Nitrite: NEGATIVE
Ph: 5 (ref 4.5–8.0)
Protein: 30
RBC,UR: 2 /HPF (ref 0–5)
Specific Gravity: 1.015 (ref 1.003–1.030)
Squamous Epithelial: <1
WBC UR: 2 /HPF (ref 0–5)

## 2014-06-08 LAB — LIPASE, BLOOD: Lipase: 322 U/L — ABNORMAL HIGH

## 2014-06-09 ENCOUNTER — Inpatient Hospital Stay
Admit: 2014-06-09 | Disposition: A | Discharge status: Home / Self Care | Payer: Self-pay | Attending: Internal Medicine | Admitting: Internal Medicine

## 2014-06-09 LAB — CBC WITH DIFFERENTIAL/PLATELET
Basophil #: 0 10*3/uL (ref 0.0–0.1)
Basophil %: 0.4 %
Eosinophil #: 0.2 10*3/uL (ref 0.0–0.7)
Eosinophil %: 2.2 %
HCT: 35.6 % (ref 35.0–47.0)
HGB: 11.9 g/dL — ABNORMAL LOW (ref 12.0–16.0)
Lymphocyte #: 1.2 10*3/uL (ref 1.0–3.6)
Lymphocyte %: 10.9 %
MCH: 30.1 pg (ref 26.0–34.0)
MCHC: 33.4 g/dL (ref 32.0–36.0)
MCV: 90 fL (ref 80–100)
Monocyte #: 1.2 x10 3/mm — ABNORMAL HIGH (ref 0.2–0.9)
Monocyte %: 10.9 %
Neutrophil #: 8.2 10*3/uL — ABNORMAL HIGH (ref 1.4–6.5)
Neutrophil %: 75.6 %
Platelet: 154 10*3/uL (ref 150–440)
RBC: 3.95 10*6/uL (ref 3.80–5.20)
RDW: 14.6 % — ABNORMAL HIGH (ref 11.5–14.5)
WBC: 10.9 10*3/uL (ref 3.6–11.0)

## 2014-06-09 LAB — BASIC METABOLIC PANEL
Anion Gap: 7 (ref 7–16)
BUN: 11 mg/dL
Calcium, Total: 7.7 mg/dL — ABNORMAL LOW
Chloride: 106 mmol/L
Co2: 24 mmol/L
Creatinine: 0.7 mg/dL
EGFR (African American): >60
EGFR (Non-African Amer.): >60
Glucose: 64 mg/dL — ABNORMAL LOW
Potassium: 3.4 mmol/L — ABNORMAL LOW
Sodium: 137 mmol/L

## 2014-06-09 LAB — LIPID PANEL
Cholesterol: 129 mg/dL
HDL Cholesterol: 52 mg/dL
Ldl Cholesterol, Calc: 60 mg/dL
Triglycerides: 87 mg/dL
VLDL Cholesterol, Calc: 17 mg/dL

## 2014-06-10 LAB — HEPATIC FUNCTION PANEL A (ARMC)
Albumin: 2.9 g/dL — ABNORMAL LOW
Alkaline Phosphatase: 66 U/L
Bilirubin, Direct: 0.1 mg/dL
Bilirubin,Total: 0.7 mg/dL
Indirect Bilirubin: 0.6
SGOT(AST): 18 U/L
SGPT (ALT): 17 U/L
Total Protein: 5.8 g/dL — ABNORMAL LOW

## 2014-06-10 LAB — LIPASE, BLOOD: Lipase: 85 U/L — ABNORMAL HIGH

## 2014-06-14 LAB — CULTURE, BLOOD (SINGLE)

## 2014-06-29 NOTE — Discharge Summary (Signed)
PATIENT NAME:  Raven [name], CREASMAN MR#:  [id number] DATE OF BIRTH:  01-Feb-1935  DATE OF ADMISSION:  05/06/2012 DATE OF DISCHARGE:  05/18/2012  HISTORY OF PRESENT ILLNESS: This 80 year old female has 18-year history of Crohn's disease, had been on chronic treatment with mesalamine and Humira. Had some rectal bleeding and had upper endoscopy which was normal, colonoscopy with findings of diverticulosis. Had a recent video capsule endoscopy with findings of severe ulceration of the small bowel and also developed pain and appeared that the small bowel became obstructed with the video capsule. She did go the Emergency Room, received some pain medicine, and eventually her pain resolved. Surgery was recommended due to appearance of severe ulceration and evidence of partial obstruction.   PAST MEDICAL HISTORY: As recorded on the typed H and P.    MEDICATIONS:  Record on typed H and P.  PHYSICAL FINDINGS:  Include clear lung fields, heart with regular rhythm, and abdomen moderately obese.   HOSPITAL COURSE: The patient was carried to the operating room.  She did have a preop prophylactic antibiotic and postoperative subcutaneous heparin. She had a laparotomy demonstrating 2 points of stricture and evidence of Crohn's disease. Had a resection with primary anastomosis. Postoperatively, she was treated with IV fluids, analgesics and gradually improved. Her prednisone was tapered. The first few days she felt quite good, but did later developed some nausea. She had an x-ray which did show an air-fluid level in the stomach. There was no evidence of obstruction on the x-ray.  We had her take less and less by mouth. She did have some  elevation of her white blood count at 13,800 and this suggested she may have had a flare up of her Crohn's seeing she was off the Humira. She was treated with intravenous Invanz and later switched over to oral Cipro and Flagyl. She gradually improved and was able to resume a diet and was  subsequently in condition for discharge on March 12th.  Her pathology demonstrated mild to moderate enteritis with ulceration. There were also operative findings of 2 strictures and at each stricture site there was a significant ulcer found.   FINAL DIAGNOSES:  Crohn's disease of the ileum with ulceration and stricture formation with partial small bowel obstruction.   OPERATION: Enterectomy.   DISCHARGE INSTRUCTIONS: Given. Plan to take at Cipro 500 mg b.i.d. for 5 days and Flagyl 500 mg 3 times a day for 5 days, and will plan to follow up in the office. ____________________________ J. Rochel Brome, MD jws:sb D: 06/01/2012 08:56:32 ET T: 06/01/2012 09:11:16 ET JOB#: [id number]  cc: Loreli Dollar, MD, <Dictator> Loreli Dollar MD ELECTRONICALLY SIGNED 06/02/2012 20:46

## 2014-06-29 NOTE — Consult Note (Signed)
PATIENT NAME:  Raven [name], Raven [name] MR#:  [id number] DATE OF BIRTH:  08-14-1934  DATE OF CONSULTATION:  05/15/2012  REFERRING PHYSICIAN:  Dr. Tamala Julian. CONSULTING PHYSICIAN:  Lupita Dawn. Monika Chestang, MD  REASON FOR REFERRAL: Low-grade fever, bandemia, nausea.   HISTORY OF PRESENT ILLNESS: The patient is a 80 year old white female with a known history of Crohn's disease who had been on Pentasa 2 grams twice a day and Humira every 2 weeks at home. She has been on Humira for at least 4 to 5 years. The patient was evaluated by Ebony Cargo in October because of increasing symptoms of abdominal pain and cramping and bowel habit changes. Therefore, I did an upper endoscopy and colonoscopy in November 2013. Gastroscopy was completely normal. Distal terminal ileum appeared normal, but there was evidence of sigmoid diverticulosis and some erosions in the descending colon, which was confirmed by biopsy. We then did a video capsule study in January. Initially, the capsule got lost in the distal bowel but eventually passed through. When I evaluated the video, the patient has a distal small bowel stricture in the ileum. There was also significant edema and inflammation. The patient was then placed on prednisone 40 mg daily in addition to the Humira and Pentasa and then referred to Dr. Tamala Julian for general surgery. On admission, the patient ended up requiring 10 inches of distal ileum that was resected. The patient had 2 strictures present. Also, the biopsies confirmed that there was active inflammation in that area.   I was consulted because the patient started having some low-grade fever with elevated white blood cell count with evidence of bandemia. The patient also has been feeling nauseous even though she is moving her bowels. She is also noticing black stools, which were not present prior to the surgery. The patient has been placed on Invanz. Of note, the prednisone was quickly tapered off at the time of surgery, and the Humira was  stopped about 3 weeks ago.   PAST MEDICAL HISTORY: Notable for Crohn's disease. She also has a history of asthma and arthritis as well as hypertension and hypercholesterolemia.   MEDICATIONS: Currently, she is on Elavil at bedtime, amlodipine daily for blood pressure. She also is on Invanz injection, hydrochlorothiazide 25 mg daily. Currently, she is on Pentasa 1000 mg twice a day as opposed to 2000 mg twice a day. Unfortunately, every time she takes Pentasa, she feels nauseous. She is on heparin injection, some inhalers. Ensure was started a few days ago to help with her nutritional status.   ALLERGIES: There are no known drug allergies.   PHYSICAL EXAMINATION:  GENERAL: The patient is in no acute distress.  VITAL SIGNS: She is afebrile, although her temperature was up to 100.6 this morning. Pulse is 94, respirations 18, blood pressure 100/55.  HEAD AND NECK: Normocephalic, atraumatic head. Pupils are equally reactive. Throat was clear. Neck was supple.  CARDIAC: Regular rhythm and rate without murmurs.  LUNGS: Clear bilaterally.  ABDOMEN: Normoactive bowels. They were soft. She did have active bowel sounds. There is a surgical scar in the midline. It is vertical.  EXTREMITIES: No clubbing, cyanosis or edema.  SKIN: Negative, otherwise.  NEUROLOGIC: Examination is nonfocal.   LABORATORY: White count was up to 13.8, but today it is down to 10.4. Hemoglobin is 11.1, platelet count is 258. There are 8% bands, 59 segs and 10% lymphs.  X-RAYS: Abdominal x-ray so far has been negative. There are no signs of perforation or dilated loops of bowels.  IMPRESSION: This is a patient who is status post ileal resection with a known history of Crohn's disease causing ileitis and ileal strictures. The patient is progressing slowly after the procedure. There is potential evidence of infection. I agree with antibiotic use. I would like to increase her Pentasa, but that is making her nauseous so we will hold  off for now. Because of a possibility of infection, we cannot resume prednisone or Humira at this point. She should get nausea medication. If she clinically does not improve quickly, then I would recommend scheduling a CT of the abdomen and pelvis. The patient also has evidence of melena. It could be from the bleeding from the surgical site. However, she could also develop stress ulcers. Therefore, I will order Protonix twice a day to see whether the melena will stop. Thank you for the referral.      ____________________________ Lupita Dawn. Candace Cruise, MD pyo:lo D: 05/16/2012 11:05:34 ET T: 05/16/2012 12:09:06 ET JOB#: [id number]  cc: Lupita Dawn. Candace Cruise, MD, <Dictator> Lupita Dawn Giuseppina Quinones MD ELECTRONICALLY SIGNED 05/17/2012 10:55

## 2014-06-29 NOTE — Consult Note (Signed)
Chief Complaint:  Subjective/Chief Complaint Feels better. Some nausea. No more melena. WBC down and no bandemia.   VITAL SIGNS/ANCILLARY NOTES: **Vital Signs.:   10-Mar-14 10:04  Vital Signs Type Q 4hr  Temperature Temperature (F) 99.2  Celsius 37.3  Temperature Source oral  Pulse Pulse 94  Respirations Respirations 18  Systolic BP Systolic BP 92  Diastolic BP (mmHg) Diastolic BP (mmHg) 55  Mean BP 67  Pulse Ox % Pulse Ox % 92  Pulse Ox Activity Level  At rest  Oxygen Delivery Room Air/ 21 %   Brief Assessment:  Cardiac Regular   Respiratory clear BS   Gastrointestinal mild tenderness at surgical site   Lab Results: Routine Hem:  10-Mar-14 04:16   WBC (CBC) 8.3  RBC (CBC)  3.65  Hemoglobin (CBC)  10.6  Hematocrit (CBC)  32.0  Platelet Count (CBC) 274 (Result(s) reported on 16 May 2012 at 11:39AM.)  MCV 88  MCH 29.0  MCHC 33.2  RDW  14.9  Neutrophil % 62.8  Lymphocyte % 16.2  Monocyte % 17.5  Eosinophil % 2.3  Basophil % 1.2  Neutrophil # 5.3  Lymphocyte # 1.4  Monocyte #  1.5  Eosinophil # 0.2  Basophil # 0.1 (Result(s) reported on 16 May 2012 at 08:48AM.)  Bands 6  Segmented Neutrophils 59  Lymphocytes 18  Variant Lymphocytes 2  Monocytes 11  Metamyelocyte 2  Myelocyte 2  Diff Comment 1 ANISOCYTOSIS  Diff Comment 2 POLYCHROMASIA  Diff Comment 3 PLTS VARIED IN SIZE  Result(s) reported on 16 May 2012 at 11:39AM.   Assessment/Plan:  Assessment/Plan:  Assessment Crohn's, s/p resection. Feeling better.   Plan Continue Abx and PPI for now. I will be out at [facility] tomorrow. Will check back on Wed if patient still here. Thanks   Electronic Signatures: Verdie Shire (MD)  (Signed 10-Mar-14 15:06)  Authored: Chief Complaint, VITAL SIGNS/ANCILLARY NOTES, Brief Assessment, Lab Results, Assessment/Plan   Last Updated: 10-Mar-14 15:06 by Verdie Shire (MD)

## 2014-06-29 NOTE — Consult Note (Signed)
Pt seen and examined. Full consult to follow. Hx of Crohn's disease on pentasa 2g bid and humira q 2wks at home. Due to increasing sxs, EGD and colon done on 11/13. EGD normal. Distal TI normal but sig tics, descending colon erosions seen.Vido capsule study in 1/14 showed distal small bowel stricture with signif edema and inflammation.  Pt placed on prednisone 78m and then referred to Dr. STamala Julianfor surgery. 10 inches of distal ileum where 2 strictures plus active inflammation were removed. Now having low grade fever, elevated WBC and bandemia noted. Also, feels nauseous and notices black stools. Pt placed on invanz. Prednisone quickely tapered off at time of surgery, and humira stopped about 3 wks ago. No evidence of peritonitis on exam. X-rays ok. Melena either from surgical site vs stress ulcer. Ordered protonix bid. Hold off on resuming steroids or humira due to possible infection. Continue pentasa for now, although not able to take normal doses( currently on 1g bid) due to nausea. I would schedule CT of abd/pelvis if no further improvement. Will follow. Thanks.   Electronic Signatures: OVerdie Shire(MD) (Signed on 09-Mar-14 14:10)  Authored   Last Updated: 10-Mar-14 10:58 by OVerdie Shire(MD)

## 2014-06-29 NOTE — Op Note (Signed)
PATIENT NAME:  Raven [name], SHANE MR#:  [id number] DATE OF BIRTH:  1934-11-07  DATE OF PROCEDURE:  05/06/2012  PREOPERATIVE DIAGNOSIS: Crohn's disease of the small bowel with partial obstruction.   POSTOPERATIVE DIAGNOSIS:  Crohn's disease of the small bowel with partial obstruction.   PROCEDURE: Partial enterectomy.   SURGEON: Rochel Brome, M.D.   ANESTHESIA: General.   INDICATION: This 80 year old female has a long history of Crohn's disease and chronic medical therapy. Recently developed rectal bleeding. Had colonoscopy and upper endoscopy and subsequent video capsule endoscopy. The capsule became caught in her bowel and she developed pain, went to the Emergency Room, was given pain medicine, was started on prednisone and symptoms improved. Bowel resection was recommended for definitive treatment.   DESCRIPTION OF PROCEDURE: The patient was placed on the operating table in the supine position under general anesthesia. The abdomen was prepared with ChloraPrep and draped in a sterile manner.   A midline incision was made above and below the umbilicus and carried down through subcutaneous tissues. Numerous small bleeding points were cauterized. The midline fascia was incised and the abdominal cavity was opened. Initial exploration revealed that there was a segment of distal ileum that had thickened wall and creeping fat, thickened mesentery. The small bowel was followed up to the ligament of Treitz and there was no other segment of inflammation. It was also followed distally to the cecum and the more terminal ileum was relatively uninvolved. Next, this segment of distal ileum was further examined. There were 2 sites which appeared to have strictures and these 2 sites were approximately 7 inches apart and each site tagged with a silk stitch along the antimesenteric border. The bowel was further examined, both proximally and distally, and elected to resect a segment of distal ileum, which was approximately  10 inches in length, which included both sites of strictures. The sites for resection were selected. The mesentery was dissected so that a window was created in the mesentery, proximally and distally. The mesenteric dissection was carried out with the Harmonic scalpel. Also numerous bleeding points were suture ligated with 0 chromic. The proximal bowel was placed beside the distal bowel. A proximal and distal enterotomy was made along the antimesenteric border. Anastomosis was begun with insertion of the GIA 75 mm stapler to begin the anastomosis along the antimesenteric border. The staple line was hemostatic. The anastomosis was completed with application of the JF-35 stapler, which was placed perpendicular to the first, engaged and activated, and the bowel resected with a scalpel. The distal end was tagged with a silk stitch for the pathologist's orientation and the bowel was passed off the table. The anastomosis was inspected and appeared to be widely patent. The apex of the staple line was imbricated with a 5-0 Vicryl suture. The junction of the staple lines was imbricated with 5-0 Vicryl. Hemostasis was intact. The mesenteric defect was closed with interrupted 5-0 Vicryl. Gloves were changed. Hemostasis appeared to be intact. Estimated blood loss for the procedure was approximately 100 mL.  The bowel was returned to the abdominal cavity. It is noted that during the course of the dissection a number of adhesions were lysed. The omentum was brought beneath the wound. The fascia was closed with interrupted 0 Maxon figure-of-eight sutures. The skin was closed with clips placing these far enough apart to allow for drainage. Dressings were applied with paper tape. The patient tolerated surgery satisfactorily and was prepared for transfer to the recovery room. ____________________________ Lenna Sciara. Rochel Brome, MD jws:sb  D: 05/06/2012 09:44:17 ET T: 05/06/2012 10:24:41 ET JOB#: [id number]  cc: Loreli Dollar, MD,  <Dictator> Loreli Dollar MD ELECTRONICALLY SIGNED 05/06/2012 18:21

## 2014-07-01 NOTE — Consult Note (Signed)
PATIENT NAME:  Raven [name], Raven [name] MR#:  [id number] DATE OF BIRTH:  04-Nov-1934  DATE OF CONSULTATION:  04/02/2011  REFERRING PHYSICIAN:  Skip Estimable, MD CONSULTING PHYSICIAN:  Demetrios Loll, MD  REASON FOR CONSULTATION: Hypoxia.  HISTORY OF PRESENT ILLNESS: The patient is a 80 year old Caucasian female with a history of chronic obstructive pulmonary disease and multiple past medical problems including rheumatoid arthritis, ankylosing spondylitis, Crohn's disease, hyperlipidemia, peptic ulcer disease, colitis, hypertension, anemia, and cataracts, who underwent subacromial surgery today. After surgery the patient has been treated with medications including oxycodone, morphine, and Tramadol. The patient was supposed to be discharged today after surgery. However, the patient developed shortness of breath and hypoxia. Oxygen saturation decreased to 88% on room air. The patient was treated with O2 by nasal cannula. Oxygen saturation increased to 97%, according to the nurse. When I examined the patient, the patient was alert, awake, oriented, and in no acute distress. The patient does state that she has chronic obstructive pulmonary disease and often has a cough and shortness of breath, but she denies any difference. She denies any fever or chills, no headache or dizziness.   SOCIAL HISTORY: No smoking, alcohol drinking, or illicit drugs.   FAMILY HISTORY: Mother has cancer. Father had a heart attack.   PAST SURGICAL HISTORY:  1. Right rotator cuff repair. 2. Appendectomy. 3. Bilateral cataracts removed.   MEDICATIONS: Please refer to medication reconciliation list.   REVIEW OF SYSTEMS: CONSTITUTIONAL: The patient denies any fever or chills. No headache or dizziness. No weight loss or weight gain. No weakness. CARDIOVASCULAR: No chest pain, palpitations, or orthopnea. No nocturnal dyspnea. No edema. PULMONARY: Positive for cough and shortness of breath. No hemoptysis. No sputum. GASTROINTESTINAL: No abdominal  pain, nausea, vomiting, or diarrhea. GENITOURINARY: No dysuria or hematuria. SKIN: No rash or jaundice. NEUROLOGY: No syncope, loss of consciousness, or seizure. PSYCHIATRIC: No depression or anxiety.   PHYSICAL EXAMINATION:   VITALS: Temperature 97.2, pulse 95, respirations 18, blood pressure 128/77, and oxygen saturation, the highest one 97% and the lowest one 83%.   GENERAL: The patient is alert, awake, and oriented, in no acute distress.   HEENT: Pupils are round, equal, and reactive to light and accommodation. Moist oral mucosa. Clear oropharynx.   NECK: Supple. No JVD or carotid bruits noted. No lymphadenopathy. No thyromegaly.   CARDIOVASCULAR: S1 and S2 rate and rhythm. No murmurs or gallops.   PULMONARY:  Bilateral air entry. No wheezing or rales.   ABDOMEN: Soft. No distention or tenderness. No organomegaly. Bowel sounds present.   EXTREMITIES: No edema, clubbing, or cyanosis. No calf tenderness.   SKIN: No rash or jaundice.   NEUROLOGY: Alert and oriented x3. No focal deficits.   LABS/STUDIES: CBC is normal.   Chest x-ray yesterday showed paralysis of the right hemidiaphragm. This may be long-standing given the patient's chronic history of recurrent episodes of pneumonia throughout her life.   IMPRESSION: Hypoxia, possibly due to pain medication and chronic obstructive pulmonary disease.   RECOMMENDATIONS: The patient has hypoxia, but no signs of COPD exacerbation. Hypoxia is possibly due to pain medication. The patient needs reduction of her pain medication. We will give Xopenex p.r.n. and Advair. The patient needs to be observed overnight and if her oxygen saturation is fine the patient may be discharged to home tomorrow. Thank you for the consult.   I discussed the patient's situation, the plan and the recommendations with the patient. She voiced understanding and agreed to the recommendations.  In addition, I  discussed this with the nurse.  TIME SPENT: Approximately  40 minutes.  ____________________________ Demetrios Loll, MD qc:slb D: 04/02/2011 15:34:49 ET T: 04/02/2011 16:39:27 ET JOB#: [id number]  cc: Demetrios Loll, MD, <Dictator> Laurice Record. Holley Bouche., MD Demetrios Loll MD ELECTRONICALLY SIGNED 04/02/2011 16:59

## 2014-07-01 NOTE — H&P (Signed)
PATIENT NAME:  Raven [name], PFOST MR#:  [id number] DATE OF BIRTH:  [birth year]  DATE OF ADMISSION:  08/28/2011  ER REFERRING PHYSICIAN: Pollie Friar, MD   PRIMARY CARE PHYSICIAN: Hewitt Blade. Walker, III, MD   CHIEF COMPLAINT: Nausea, vomiting, chills, cough with yellow expectoration.   HISTORY OF PRESENT ILLNESS: The patient is a 80 year old female with past medical history of rheumatoid arthritis and ankylosing spondylitis, Crohn's disease, hypertension, hyperlipidemia, who was in her usual state of health until Tuesday when she started developing chills.  She came to the walk-in clinic on Wednesday and had a chest x-ray and was diagnosed with pneumonia. She was started on Levaquin and Benzonatate. Today the patient developed some cough with green-yellow expectoration and some nausea and vomiting. The patient reports that she has vomited 5 to 6 times today. She denies any diarrhea or constipation. She denies any difficulty urinating or oliguria; however, she was found to have developing lingular pneumonia and acute renal failure. The patient has multiple dermatological disorders and is on multiple immunosuppressants, including Humira, leflunomide, and prednisone.  The patient does not want her prednisone to be discontinued because during one of her prior hospitalizations it was stopped, and she had developed adrenal insufficiency and worsening of her symptoms.   ALLERGIES: Augmentin, Indocin, Lodine, methotrexate, Zantac, Augmentin.   CURRENT MEDICATIONS:  1. Actonel 150 mg monthly.  2. Advair 100/50, 1 puff b.i.d.  3. Amlodipine 5 mg daily.  4. Benzonatate  200 mg every 8 hours. 5. Lasix 40 mg daily as needed for swelling.  6. HCTZ 25 mg daily.  7. Leflunomide 10 mg daily.  8. Levaquin 750 mg daily for 10 days.  9. Multivitamin 1 tablet daily.  10. Pentasa 500 mg, 2 tablets b.i.d.  11. Prednisone 5 mg daily.  12. Tylenol p.r.n.  13. Vitamin E, vitamin B6, vitamin C daily.   PAST SURGICAL  HISTORY:  1. Appendectomy. 2. Right rotator cuff repair.   PAST MEDICAL HISTORY:  1. Ankylosing spondylitis.  2. Sacroiliitis with spinal involvement. 3. Iritis. 4. Colitis. 5. HLA B27 positive rheumatoid arthritis, status post gold, methotrexate and Enbrel, stopped because of flare-up of Crohn's disease. She is on Humira, leflunomide and prednisone.  6. Component of peptic ulcer disease.  7. Osteopenia, on Actonel. 8. Depression. 9. Iron deficiency anemia.  10. Hyperlipidemia.  11. Hypertension.  12. Crohn's disease. 13. Osteoarthritis.  14. Cervical disk disease.  15. Neuropathy.  16. Chronic obstructive pulmonary disease.   SOCIAL HISTORY: She lives by herself. She denies any history of smoking or alcohol abuse. Two of her brothers had coronary artery disease and underwent coronary artery bypass graft. Father had a myocardial infarction at the age of 43.    REVIEW OF SYSTEMS: CONSTITUTIONAL: Denies any fever. Reports fatigue, weakness, and chills. EYES: Denies any blurred or double vision. ENT: Denies any tinnitus, ear pain. RESPIRATORY: Reports cough, yellow-green expectoration. CARDIOVASCULAR: Denies any chest pain, palpitations. GI: Reports nausea and vomiting. Denies any diarrhea or abdominal pain. GU: Denies any dysuria or hematuria. ENDOCRINE: Denies any polyuria or nocturia. HEME/LYMPH: Denies any anemia, easy bruisability. INTEGUMENTARY: Denies any acne, rash. MUSCULOSKELETAL: no cyanosis/clubbing.  NEUROLOGICAL: Denies any numbness or weakness. PSYCHIATRIC: Denies any anxiety or depression.    PHYSICAL EXAMINATION:  VITAL SIGNS: Temperature 97.8, heart rate 95, respiratory rate 18, blood pressure 88/47, pulse oximetry 100%.   GENERAL: The patient is a 80 year old female lying comfortably in bed, not in acute distress.   HEENT: Head: Atraumatic, normocephalic. Eyes: There is  some pallor. No icterus or cyanosis. Pupils are equal, round and reactive to light and accommodation.  Extraocular movements are intact. Wet mucous membranes.  No oropharyngeal erythema or thrush.   NECK: Supple. No masses. No jugular venous distention, thyromegaly or lymphadenopathy.   CHEST WALL: No tenderness to palpation. Not using accessory muscles of respiration. No intercostal muscle retractions.   LUNGS: Bilaterally clear to auscultation. No wheezing, rales, or rhonchi.   CARDIOVASCULAR: S1, S2 regular. No murmur, rubs, or gallops.   ABDOMEN: Soft, nontender, nondistended. No guarding or rigidity. No organomegaly. Normal bowel sounds.   SKIN: No rashes or lesions.   PERIPHERIES: No pedal edema, 2+ pedal pulses.   MUSCULOSKELETAL: No cyanosis, no clubbing.   NEUROLOGICAL: Awake, alert, oriented x3. Nonfocal neurological exam. Cranial nerves are grossly intact.    PSYCHIATRIC: Normal mood and affect.   LABORATORY, DIAGNOSTIC, AND RADIOLOGICAL DATA:  CT of the abdomen shows colonic sigmoid colonic diverticulitis, cannot exclude diverticulitis; however, clinically there is no evidence of  left lower quadrant pain.  Chest x-ray shows atelectasis or pneumonia in the lingula. White count 13.9, hemoglobin 11.5, hematocrit 35.9, platelet count 333.  Glucose 115, BUN 37, creatinine 2.36, sodium 135, potassium 3.6. The rest of the complete metabolic panel is normal. Lipase 85.   ASSESSMENT AND PLAN: A 80 year old female with past medical history of multiple rheumatological disorders, on multiple immunosuppressants, presents with chills, cough, yellow-green expectoration, nausea and vomiting.   1. Sepsis with hypotension and leukocytosis: The patient had to be resuscitated with IV fluids. She is on multiple immunosuppressants and prednisone. When her prednisone was discontinued in the past, she had developed adrenal insufficiency. She will likely require stress dose steroids. We will give her hydrocortisone 100 mg t.i.d. at least for the first 24 hours.  2. Possible developing lingular  pneumonia: Chest x-ray shows possible atelectasis versus pneumonia in that area. The patient has been having chills, leukocytosis, coughing yellow-green expectoration. Blood cultures were not done in the Emergency Room. The only antibiotic that was given was Flagyl. We will obtain blood cultures before starting any antibiotics. We will give the patient broad-spectrum antibiotics to include vancomycin, Invanz, and azithromycin since the patient is severely immunocompromised being on multiple immunosuppressant medications. We will continue her Advair and place on p.r.n. nebulizer treatments and oxygen supplementation.  3. Leukocytosis: Likely due to acute infection. We will monitor closely.  4. Anemia: Possibly due to chronic disease. We will monitor closely.  5. Hyperglycemia: This is likely related to the patient's steroid use. The patient has no history of diabetes.  6. Acute renal failure: In the past, the patient's creatinine was normal. This could be related to prerenal/dehydration versus ATN due to hypotension. We will hydrate the patient aggressively with IV fluids, avoid nephrotoxic medications, renally dose all her medications. Monitor strict ins and outs and recheck a creatinine in the a.m. We will hold her hydrochlorothiazide and Lasix. 7. History of ankylosing spondylitis and rheumatoid arthritis: We will hold the patient's Humira and leflunomide and give her stress dose steroids in view of sepsis and developing pneumonia.  8. History of Crohn's disease: We will continue Pentasa.  9. Hypertension: The patient was hypotensive on presentation. Will hold all her antihypertensive medications for the time being.   10. Chronic obstructive pulmonary disease: Appears to be stable at present. We will continue Advair and  place on p.r.n. nebulizer treatments.   I reviewed all medical records, discussed with the patient and her son the plan of care  and management.   TIME SPENT: 75 minutes.    ____________________________ Cherre Huger, MD sp:cbb D: 08/28/2011 15:12:55 ET T: 08/28/2011 15:54:20 ET JOB#: [id number]  cc: Cherre Huger, MD, <Dictator> John B. Sarina Ser, MD Cherre Huger MD ELECTRONICALLY SIGNED 08/28/2011 16:52

## 2014-07-01 NOTE — Discharge Summary (Signed)
PATIENT NAME:  Raven [name], Raven [name] MR#:  [id number] DATE OF BIRTH:  February 28, 1935  DATE OF ADMISSION:  04/02/2011 DATE OF DISCHARGE:  04/03/2011  ADDENDUM   The patient is a 80 year old who had undergone a right shoulder subacromial decompression with repair of rotator cuff. She was seen day one after surgery and was doing well. However,  after leaving the room, the patient became extremely nauseated. Her O2 dropped to 82.  She was placed on oxygen. A medical consult was obtained. Chest x-rays were obtained. No evidence of any pneumonia. Her vital signs are stable. No evidence of any PE. It was felt that the nausea was secondary to the pain medicine and possibly hypoxemia was also related to the pain medicine. This was changed to Ultram. The nausea cleared up and her 02 also improved. She was placed on 2 liters of oxygen. Her 02 has been running 90-93% on 1 liter. The patient on day two was doing very well, sitting up on the edge of the bed having no difficulty breathing. Denied any chest pains or any shortness of breath.    The patient was given a breathing treatment on day two and was discharged to home in improved stable condition. The remainder of her history and treatment plan is as her Discharge Summary. I do recommend that she follow back up with Dr. Raul Del who is her pulmonologist.    ____________________________ Vance Peper, PA jrw:ap D: 04/03/2011 07:24:27 ET T: 04/03/2011 11:51:23 ET JOB#: [id number]  cc: Vance Peper, PA, <Dictator> [place] PA ELECTRONICALLY SIGNED 04/03/2011 19:09

## 2014-07-01 NOTE — Discharge Summary (Signed)
PATIENT NAME:  Raven [name], Raven [name] MR#:  [id number] DATE OF BIRTH:  29-Aug-1934  DATE OF ADMISSION:  04/01/2011 DATE OF DISCHARGE:  04/02/2011  ADMITTING DIAGNOSIS: Rotator cuff tear of right shoulder.   DISCHARGE DIAGNOSIS: Rotator cuff tear of right shoulder.   HISTORY: The patient is a 80 year old who has been followed at [facility] for progression of right shoulder pain. She had previously underwent a right rotator cuff repair in March 2006. She had done reasonably well up until approximately six months ago when she developed the sudden onset of pain once again to the right shoulder. This occurred while she was making the bed. The patient had not seen any significant improvement in her condition despite cortisone injections as well as activity modification. She had localized most of the pain along the subacromial region. She was noted to have difficulty with elevation of the arm above shoulder height. She denied any pain radiating down the arm. X-rays taken in the clinic did not reveal any other bony abnormalities, however, MRI dated 06/12 showed supraspinatus tendon tear.  There were some findings suggestive of a portion of the infraspinatus tear having a partial tear. After discussion of the risks and benefits of surgical intervention, the patient expressed her understanding of the risks and benefits and agreed with plans for surgical intervention.   PROCEDURE: Subacromial decompression with rotator cuff repair of the right shoulder.   HOSPITAL COURSE: The patient tolerated the procedure very well. She had no complications. She was then taken to the PAC-U where she was stabilized and then transferred to the orthopedic floor. The patient was placed on observation. She had an uneventful night. The pain was well controlled. She was fitted with an abduction pillow and sling. Ice was applied to the right shoulder. Her pain has been well controlled. She has denied any numbness, tingling, or burning  sensation to the upper extremities. She has been able to move the fingers well. She has had no chest pain or shortness of breath.   The patient's vital signs have been stable. She has been afebrile.   The patient is being discharged to home in improved stable condition.    DISCHARGE INSTRUCTIONS:  1. She will continue wearing the abduction pillow and sling.  2. She is instructed on wound care.  3. She is to follow-up in clinic on 04/09/2011 at 11:00 a.m., sooner if any temperatures of 101.5 or greater.  4. She is to continue applying ice.  5. She is placed on a regular diet.  6. She is given a prescription for oxycodone 1 to 2 tablets every 4 to 6 hours p.r.n. for pain. 7. It is recommended she take 1 aspirin per day for anticoagulation.   PAST MEDICAL HISTORY:  1. Rheumatoid arthritis. 2. Ankylosing spondylitis. 3. Crohn's disease.  4. Hyperlipidemia.  5. Peptic ulcer disease.  6. Colitis. 7. Chickenpox. 8. Hypertension. 9. Anemia.  10. Shingles.  11. Cataracts.  ____________________________ Vance Peper, PA jrw:slb D: 04/02/2011 07:17:16 ET T: 04/02/2011 12:35:37 ET JOB#: [id number]  cc: Vance Peper, PA, <Dictator> Raven Rawe PA ELECTRONICALLY SIGNED 04/02/2011 19:46

## 2014-07-01 NOTE — Op Note (Signed)
PATIENT NAME:  Raven [name], Raven [name] MR#:  [id number] DATE OF BIRTH:  01-29-1935  DATE OF PROCEDURE:  04/01/2011  PREOPERATIVE DIAGNOSIS: Right rotator cuff tear.   POSTOPERATIVE DIAGNOSIS: Right rotator cuff tear.   PROCEDURE PERFORMED: Right rotator cuff repair and subacromial decompression.   SURGEON: Laurice Record. Holley Bouche., MD   ANESTHESIA: General.   ESTIMATED BLOOD LOSS: Minimal.   FLUIDS REPLACED: 1000 mL of Crystalloid.   IMPLANTS UTILIZED: ArthroCare 5.5 mm Spartan PEEK suture implants x2.   INDICATIONS FOR SURGERY: The patient is a 80 year old female who has been seen for complaints of progressive right shoulder pain and difficulty with active range of motion. The patient had previously undergone a right rotator cuff repair approximately seven years ago. She had done well initially but over the course of the last years had increasing difficulty with shoulder mobility. MRI demonstrated findings consistent with tear of the supraspinatus tendon with evidence of retraction. After discussion of the risks and benefits of surgical intervention, the patient expressed her understanding of the risks and benefits and agreed with plans for surgical intervention.   PROCEDURE IN DETAIL: The patient was brought in the operating room and, after adequate general anesthesia was achieved, the patient was placed in the modified beach chair position. Head was secured in a headrest and all bony prominences were well padded. The patient's right shoulder and arm were cleaned and prepped with alcohol and DuraPrep and draped in the usual sterile fashion. A "time-out" was performed as per usual protocol. The anticipated incision site was injected with 0.25% Marcaine with epinephrine. Anterior oblique incision was made in line with the previous surgical scar. Fibers of the deltoid were split in line and a "deltoid on" approach was utilized by elevating the deltoid off of the acromion in a subperiosteal fashion. The  leading edge of the acromion was noted be extremely irregular and sharp margin was encountered. Anterior-inferior wafer of bone of approximately 2 to 3 mm in thickness was removed with additional contouring performed using a TPS high-speed rasp. Wound was irrigated and suctioned dry. Shoulder was placed through a range of motion. There was noted to be a full thickness tear of the supraspinatus tendon with retraction. The subscapularis attachment appeared to be intact. Rongeurs were used to prepare bleeding bed of bone just medial to the greater tuberosity. Two 5.5 mm Spartan PEEK suture anchors were inserted and the leading edge of the supraspinatus tendon was mobilized and reattached via the associated #2 magnum wire sutures. Shoulder was placed through a range of motion with excellent maintenance of the repair noted. Wound was again irrigated with copious amounts of normal saline with antibiotic solution. Good hemostasis was noted. Deltoid was repaired in a side-to-side fashion using interrupted sutures of #1 Ethibond. Subcutaneous tissue was approximated in layers using first #0 Vicryl followed by #2-0 Vicryl. Skin was closed with running subcuticular suture of #4-0 Vicryl. Steri-Strips were applied followed by application of a sterile dressing.   The patient tolerated the procedure well. She was transported to the recovery room in stable condition.    ____________________________ Laurice Record. Holley Bouche., MD jph:drc D: 04/01/2011 22:10:35 ET T: 04/02/2011 09:58:09 ET JOB#: [id number]  cc: Laurice Record. Holley Bouche., MD, <Dictator> JAMES P Holley Bouche MD ELECTRONICALLY SIGNED 04/02/2011 20:31

## 2014-07-01 NOTE — Discharge Summary (Signed)
PATIENT NAME:  Raven [name], Raven [name] MR#:  [id number] DATE OF BIRTH:  1934/10/12  DATE OF ADMISSION:  08/28/2011 DATE OF DISCHARGE:  09/01/2011  ADDENDUM  DISCHARGE DIAGNOSES: Pneumonia with sepsis.  ____________________________ Hewitt Blade Sarina Ser, MD jbw:slb D: 09/17/2011 07:58:33 ET T: 09/17/2011 11:44:10 ET JOB#: [id number]  cc: Jenny Reichmann B. Sarina Ser, MD, <Dictator> Lottie Mussel III MD ELECTRONICALLY SIGNED 09/17/2011 14:19

## 2014-07-01 NOTE — Discharge Summary (Signed)
  Raven Mussel III MD ELECTRONICALLY SIGNED 09/15/2011 10:06PATIENT NAME:  Raven [name], Raven [name] MR#:  [id number] DATE OF BIRTH:  11-29-1934  DATE OF ADMISSION:  08/28/2011 DATE OF DISCHARGE:  09/01/2011  HISTORY OF PRESENT ILLNESS: Raven [name] was a 80 year old white lady with known rheumatoid arthritis and ankylosing spondylitis who had recently been treated as an outpatient for an upper respiratory tract infection with oral Levaquin. She failed as an outpatient and presented to the Emergency Room for further evaluation. She was known to be on multiple immunosuppressants including Humira, leflunomide, and prednisone.   The patient's past medical history, medications, and allergies were well outlined by the hospitalist in his admission note.   The patient's laboratory work included a CBC which showed a hemoglobin of 11.5 with a hematocrit of 35.9. White count was 13,900. Platelet count was 333,000. Admission comprehensive metabolic panel showed a random blood sugar of 115. BUN was 37 with a creatinine of 2.36. Estimated GFR was 20. Albumin was 3.1. Admission urinalysis was within normal limits.   Admission chest x-ray showed minimal density in the left lower lung compatible with pneumonia in the lingula.   CT scan of the abdomen and pelvis showed sigmoid colonic diverticulosis only.   HOSPITAL COURSE: The patient was admitted to the regular medical floor where she was rehydrated with IV fluids. She was treated for five days with IV Levaquin. A follow-up chest x-ray showed small pleural effusions. The lingular density was gone. Interstitial prominence was noted. It was unclear whether this was pneumonitis or possibly volume overload. The patient, however, was improved and asymptomatic. The patient's final CBC showed a hemoglobin of 9.2 with a hematocrit of 28.4. This was after rehydration. White count was down to 7100. The patient's final chemistry panel showed a random blood sugar of 134. BUN was 14 with a  creatinine of 1.02. Chloride was 111. Estimated GFR was 54.   DISCHARGE DIAGNOSES:  1. Community-acquired pneumonia of the lingula.  2. Ankylosing spondylitis.  3. Colitis.  4. Rheumatoid arthritis.   5. Osteopenia.  6. Hyperlipidemia.  7. Hypertension.  8. Crohn's disease.  9. History of chronic obstructive pulmonary disease.   DISCHARGE DISPOSITION:  1. The patient had completed her course of IV antibiotics while in the hospital and was not continued on p.o. antibiotics.  2. She was continued on all of her preadmission medications without change.  3. The patient was discharged on a low sodium diet with activity as tolerated.   4. She is to be followed up in the office in 1 to 2 weeks.   ____________________________ Hewitt Blade Sarina Ser, MD jbw:drc D: 09/15/2011 07:44:54 ET T: 09/15/2011 08:57:13 ET JOB#: [id number]  cc: Jenny Reichmann B. Sarina Ser, MD, <Dictator>

## 2014-07-08 NOTE — Consult Note (Signed)
Brief Consult Note: Diagnosis: nausea, vomiting, abdominal pain.   Patient was seen by consultant.   Comments: 39 yof with RA and 3 days of N, V, mid-abdominal pain, and diarrhea. No objective signs of surgical illness. Gallbladder distention and Lipase of 322 are both nonspecific indicators of GI illness and anorexia / vomiting. Since she has no stones, pericholecystic fluid, pancreatic inflammation on CT, LFT abnormalities, these indicators are unhelpful. Her exam reveals no true peritoneal signs, distention or tympany. I have no suggestions other than to point out that Methotrexate occasionally causes pancreatitis.    Even if she had a surgical illness, I could not operate on her for 5 days because of her Plavix. Therefore I will sign off. Please reconsult for objective signs of acute surgical illness..  Electronic Signatures: Consuela Mimes (MD)  (Signed 02-Apr-16 10:24)  Authored: Brief Consult Note   Last Updated: 02-Apr-16 10:24 by Consuela Mimes (MD)

## 2014-07-08 NOTE — Discharge Summary (Signed)
PATIENT NAME:  Raven [name], Raven [name] MR#:  [id number] DATE OF BIRTH:  19-Mar-1934  DATE OF ADMISSION:  06/08/2014 DATE OF DISCHARGE:  06/10/2014  PRESENTING COMPLAINT: Intractable vomiting, abdominal pain.  DISCHARGE DIAGNOSIS: Intractable vomiting, resolved. Appears secondary to acute gastroenteritis.  CODE STATUS: FULL.  DISCHARGE MEDICATIONS: 1.  Actonel 150 mg 1 tablet once a month.  2.  Citracal plus vitamin D p.o. daily.  3.  Vitamin B12 1 tablet daily.  4.  Senna-Lax 50/8.6 two tablets once a day.  5.  Leflunomide 10 mg p.o. daily at bedtime.  6.  Amlodipine/benazepril 5/20 one p.o. daily.  7.  Amitriptyline 10 mg 2 tablets once a day.  8.  Hydrochlorothiazide 25 mg 1 tablet daily.  9.  Advair Diskus 100/50 two puffs b.i.d.  10.  Aspirin 81 mg daily at bedtime.  11.  Acetaminophen 325 one tablet every 8 hours as needed.  12.  Nexium 40 mg 2 capsules once a day.  13.  Atorvastatin 80 mg p.o. daily at bedtime.  14.  Atorvastatin 10 mg daily in the morning.  15.  Plavix 75 mg daily.  16.  Humira subcutaneous once every 2 weeks as per your previous dose, which is 40 mg/0.8 mL.  17.  Mesalamine 500 mg extended release p.o. b.i.d.  18.  Metoprolol succinate 1 tablet p.o. daily.  19.  Multivitamin p.o. daily.  20.  Potassium chloride 10 mEq p.o. daily.  21.  Prednisone 5 mg in the morning and 1 mg 2 tablets at bedtime.  22.  Zofran 4 mg 3 times a day as needed.   DISCHARGE DIET: Mechanical soft.  DISCHARGE INSTRUCTIONS: Follow up with Dr. Ivy Lynn as outpatient.  DIAGNOSTIC DATA: Lipase 85. LFTs within normal limits. Albumin is 2.9. CBC within normal limits. BUN and creatinine within normal limits.  CONSULTANTS: Surgical with Dr. Leanora Cover.  BRIEF SUMMARY OF HOSPITAL COURSE: Erian Lariviere is a 80 year old Caucasian female with past medical history of hypertension, CAD status post stent, COPD, Crohn's, and rheumatoid arthritis who comes in with intractable nausea and vomiting for  the past 3 days. She was admitted with:  1.  Abdominal pain associated with intractable vomiting for past 3 days, likely due to acute gastroenteritis, suspect viral. She had mildly elevated lipase in the setting of vomiting. IV hydration was provided. There was no indication for an antibiotic. She was started on clear liquid, advanced to full mechanical soft prior to discharge. Surgical input from Dr. Leanora Cover  was noted. Recommended continue conservative management.  2.  Hyperkalemia secondary to gastrointestinal losses, repleted.  3.  Hypertension. Resumed home medications.  4.  History of rheumatoid arthritis. The patient will resume her medications on discharge.  5.  COPD. Remains stable.  6.  Deep vein thrombosis prophylaxis. Subcutaneous Lovenox.   Hospital stay otherwise remained stable. The patient remained a FULL code.   TIME SPENT: 40 minutes.   ____________________________ Hart Rochester Posey Pronto, MD sap:sb D: 06/12/2014 11:47:40 ET T: 06/12/2014 12:36:14 ET JOB#: [id number]  cc: Tate Zagal A. Posey Pronto, MD, <Dictator> Ilda Basset MD ELECTRONICALLY SIGNED 06/12/2014 16:00

## 2014-07-08 NOTE — H&P (Signed)
PATIENT NAME:  Raven [name], Raven [name] MR#:  [id number] DATE OF BIRTH:  07-09-1934  DATE OF ADMISSION:  06/09/2014  The patient was examined by me on 06/08/2014.   REFERRING DOCTOR: Briant Sites. Joni Fears, MD   PRIMARY CARE PRACTITIONER: Hewitt Blade. Sarina Ser, MD of [facility]   ADMITTING DOCTOR: Juluis Mire, MD     CHIEF COMPLAINT: Abdominal pain with nausea and vomiting ongoing for the past 3 days.   HISTORY OF PRESENT ILLNESS: A 80 year old Caucasian female with a past medical history of hypertension, coronary artery disease, status post PTCA, hyperlipidemia, Crohn disease, COPD, rheumatoid arthritis presents to the Emergency Room with the complaints of abdominal pain with associated nausea and vomiting for the past 3 days. The patient stated that she has been having these symptoms for the past 3 days, which increased last night. Hence, came to the Emergency Room for evaluation.   In the Emergency Room, the patient was evaluated by the ED physician and was noted to be mildly febrile with a temperature of 99.7 degrees Fahrenheit and stable vital signs and workup revealed elevated white blood cell count of 12.1 and a lipase of 322 and a potassium of 2.9, and a CT of the abdomen and the pelvis significant for right upper quadrant edema with gallbladder distention. The patient was given IV fluids and IV pain control medications  with a diagnosis of acute pancreatitis, hospitalist service was consulted for further evaluation and management. At the current time, patient states her abdominal pain is slightly better but she still has abdominal pain and nausea is under control. As noted earlier, she denies any history of fever at home. No diarrhea. No chest pain. No shortness of breath. No dizziness or loss of consciousness. No focal weakness, numbness. No urinary symptoms.   PAST MEDICAL HISTORY:  1.  Hypertension.  2.  Coronary artery disease, status post PTCA.  3.  Hyperlipidemia.  4.  Crohn disease,  status post bowel resection.  5.  COPD.   6.  Rheumatoid arthritis.   PAST SURGICAL HISTORY:  1.  Appendectomy.  2.  Bowel resection for Crohn disease.  3.  Right rotator cuff repair.   ALLERGIES: METHOTREXATE, AUGMENTIN, LODINE,   INDOCIN, ZANTAC.   FAMILY HISTORY: Significant for 2 brothers with a history of coronary artery disease.   SOCIAL HISTORY: She is widowed, lives alone at home. History of smoking in the remote past, quit about 30 years ago. Occasional alcohol intake present. Denies any history of substance abuse.   HOME MEDICATIONS:  1.  Acetaminophen 325 mg 1 tablet orally every 4 hours as needed for pain.  2.  Actonel 150 mg oral tablet, 1 tablet orally once a month.  3.  Advair Diskus 100/50 mcg, 1 inhalation 2 times a day.  4.  Amitriptyline 10 mg 2 tablets orally at bedtime.  5.  Amlodipine/benazepril 5 mg/20 mg 1 tablet orally once a day in the morning.  6.  Aspirin low dose 81 mg 1 tablet orally once a day.  7.  Atorvastatin 10 mg 1 tablet orally once a day.  8.  Citracal plus D 1 tablet orally once a day.  9.  Humira  40 mg subcutaneous kit, subcutaneous once every 2 weeks.  10.  Hydrochlorothiazide 25 mg tablet, 1 tablet orally in the morning.  11.  Leflunamide 10 mg tablet, 1 tablet orally at bedtime.  12.  Mesalamine 500 mg oral tablet 1 to 2 times a day.  13.  Metoprolol succinate  25 mg extended release tablet, 1 tablet orally once a day.  14.  Multivitamin 1 tablet orally once a day.  15.  Nexium 40 mg 2 capsules orally once a day in the morning.  16.  Pentasa 500 mg oral capsule extended release, 1 capsule orally 2 times a day.  17.  Plavix 75 mg 1 tablet orally once a day.  18.  Potassium chloride 10 mEq oral capsule extended release, 1 capsule orally once a day.    REVIEW OF SYSTEMS:  CONSTITUTIONAL: Negative for fever, chills. No fatigue. No generalized weakness.  EYES: Negative for blurred vision, double vision. No pain. No redness. No discharge.   EARS, NOSE, AND THROAT: Negative for tinnitus, ear pain, hearing loss, epistaxis, nasal discharge, difficulty swallowing.  RESPIRATORY: Negative for cough, wheezing, dyspnea, hemoptysis, painful respiration.  CARDIOVASCULAR: Negative for chest pain, palpitations, dizziness, syncopal episodes, orthopnea, dyspnea on exertion, pedal edema.  GASTROINTESTINAL: Positive for abdominal pain with associated nausea and vomiting for the past 3 days as noted in the history of present illness.  No blood in the vomitus. No diarrhea.  GENITOURINARY: Negative for dysuria, frequency, urgency, or hematuria.  ENDOCRINE: Negative for polyuria, nocturia, heat or cold intolerance.  HEMATOLOGIC AND LYMPHATIC: Negative for anemia, easy bruising, bleeding, swollen glands.  INTEGUMENTARY: Negative for acne, skin rash, or lesion.  MUSCULOSKELETAL: History of rheumatoid arthritis, stable on home medications, currently denies any joint pains.  NEUROLOGICAL: Negative for focal weakness or numbness. No history of CVA, TIA, seizure disorder.  PSYCHIATRIC: Negative for anxiety, insomnia, depression.    PHYSICAL EXAMINATION:  VITAL SIGNS: Temperature on arrival 99.7 degrees Fahrenheit, pulse rate 92 per minute,  respirations 20 per minute, blood pressure 146/74, O2 saturations 95% on room air.  GENERAL: Well developed, well nourished, alert, comfortable lying in the bed but mildly distressed because of abdominal pain.  HEAD: Atraumatic, normocephalic.  EYES: Pupils equal, react to light and accommodation. No conjunctival pallor. No icterus. Extraocular movements intact.  NOSE: No drainage. No lesions.  EARS: No drainage. No external lesions.  ORAL CAVITY: Mouth: No mucosal lesions. No exudates.  NECK: Supple. No JVD. No thyromegaly. No carotid bruit. Range of motion of neck within normal limits.  RESPIRATORY: Good respiratory effort. Not using accessory muscles of respiration. Bilateral vesicular breath sounds present. No  rales or rhonchi.  CARDIOVASCULAR: S1, S2 regular. No murmurs, gallops, clicks. Pulses equal at carotid, femoral, and pedal pulses. No peripheral edema.  GASTROINTESTINAL: Abdomen: Diffuse mild tenderness present. No hepatosplenomegaly. No masses. No rigidity. No guarding. Bowel sounds present in all quadrants.  GENITOURINARY: Deferred.  MUSCULOSKELETAL: No joint tenderness or effusion. Range of motion adequate.. Strength and tone equal bilateral.  SKIN: Inspection within normal limits.  LYMPHATIC: No cervical lymphadenopathy.  VASCULAR: Good dorsalis pedis and posterior tibial pulses.  NEUROLOGICAL: Alert, awake, and oriented x 3. Cranial nerves II through XII grossly intact. No sensory deficit. Motor strength 5/5 in both upper and lower extremities. DTRs 2+ bilateral, symmetrical. Plantars downgoing.  PSYCHIATRIC: Alert, awake, and oriented x 3. Judgment, insight adequate. Memory and mood within normal limits.   ANCILLARY DATA:  LABORATORY DATA: Serum glucose 80, BUN 12, creatinine 0.74, sodium 137, potassium 2.9, chloride 102, bicarbonate 23, total calcium 8.6, lipase 322, total protein 6.5, albumin 3.6, total bilirubin 1.1, alkaline phosphatase 65, AST 17, ALT 17, WBC 12.1, hemoglobin 12.8, hematocrit 38.8, platelet count 176,000. Urinalysis: WBC 2, bacteria trace, clear, blood negative.  IMAGING STUDIES: Chest x-ray:  Mild linear basilar scarring or  atelectasis unchanged, right hemidiaphragm elevation.   CT of the abdomen and the pelvis:  1.  Mild motion degradation.  2.   right upper quadrant edema with concurrent gallbladder distention. Considerations include acute cholecystitis, pancreatitis, or less likely duodenitis.  3.  Trace right pleural fluid and perihepatic ascites.  4.  Moderate right hemidiaphragm elevation.  5.  Atherosclerosis including the coronary artery.  6.  Pelvic floor laxity.   Ultrasound of right upper quadrant: Gallbladder is distended but otherwise appears normal.  No calculi or sludge, gallbladder wall thickness normal, CBD normal diameter.   EKG: Pending at this time.   ASSESSMENT AND PLAN: A 80 year old Caucasian female with a past medical history of hypertension, coronary artery disease, status post stent, chronic obstructive pulmonary disease, Crohn disease, rheumatoid arthritis, hyperlipidemia presents with diffuse abdominal pain with associated nausea and vomiting for the past 3 days, found to have elevated lipase, leukocytosis, and febrile on arrival. CT of the abdomen significant for right upper quadrant edema with gallbladder distention and negative for gallstones. Right upper quadrant sonogram also shows gallbladder distention but no evidence of cholecystitis or gallstones.  1.  Abdominal pain with associated nausea and vomiting ongoing for the past 3 days, acute pancreatitis. Plan: Admit to medical floor. Keep her n.p.o. except medications. IV hydration. IV pain control medications. We will obtain blood cultures in view of mildly febrile and elevated white blood cell count. We will also start IV antibiotics in view of patient's immunocompromised status, meropenem.  A GI consultation and general surgery consultation requested for further advice and follow up lipase. We will check fasting lipids.  2.  Hypokalemia likely secondary to gastrointestinal loss. Plan: Replace potassium. Follow BMP.  3.  Hypertension, stable on home medications. Continue same.  4.  Crohn disease, status post bowel resection, stable on home medications. Continue same.  5.  Rheumatoid arthritis, stable on home medications. Continue same.   6.  Coronary artery disease, status post stent, stable. No acute problems, continue home medications.  7.  Chronic obstructive pulmonary disease, stable. No acute problems. Continue home medications.  8.  Deep vein thrombosis prophylaxis. Subcutaneous Lovenox.  9.  Genitourinary prophylaxis.  Proton pump inhibitor.   CODE STATUS: Full code.    TIME SPENT: 55 minutes.    ____________________________ Juluis Mire, MD enr:AT D: 06/09/2014 02:19:01 ET T: 06/09/2014 06:21:23 ET JOB#: [id number]  cc: Juluis Mire, MD, <Dictator> John B. Sarina Ser, MD Juluis Mire MD ELECTRONICALLY SIGNED 06/09/2014 20:09

## 2014-09-27 ENCOUNTER — Ambulatory Visit: Payer: Medicare Other | Admitting: Anesthesiology

## 2014-09-27 ENCOUNTER — Ambulatory Visit
Admission: RE | Admit: 2014-09-27 | Discharge: 2014-09-27 | Disposition: A | Discharge status: Home / Self Care | Payer: Medicare Other | Source: Ambulatory Visit | Attending: Gastroenterology | Admitting: Gastroenterology

## 2014-09-27 ENCOUNTER — Encounter: Payer: Self-pay | Admitting: *Deleted

## 2014-09-27 ENCOUNTER — Encounter
Admission: RE | Disposition: A | Discharge status: Home / Self Care | Payer: Self-pay | Source: Ambulatory Visit | Attending: Gastroenterology

## 2014-09-27 DIAGNOSIS — D120 Benign neoplasm of cecum: Secondary | ICD-10-CM | POA: Diagnosis not present

## 2014-09-27 DIAGNOSIS — J45909 Unspecified asthma, uncomplicated: Secondary | ICD-10-CM | POA: Diagnosis not present

## 2014-09-27 DIAGNOSIS — F329 Major depressive disorder, single episode, unspecified: Secondary | ICD-10-CM | POA: Insufficient documentation

## 2014-09-27 DIAGNOSIS — E785 Hyperlipidemia, unspecified: Secondary | ICD-10-CM | POA: Diagnosis not present

## 2014-09-27 DIAGNOSIS — R9431 Abnormal electrocardiogram [ECG] [EKG]: Secondary | ICD-10-CM | POA: Diagnosis not present

## 2014-09-27 DIAGNOSIS — M1990 Unspecified osteoarthritis, unspecified site: Secondary | ICD-10-CM | POA: Diagnosis not present

## 2014-09-27 DIAGNOSIS — I252 Old myocardial infarction: Secondary | ICD-10-CM | POA: Insufficient documentation

## 2014-09-27 DIAGNOSIS — K5090 Crohn's disease, unspecified, without complications: Secondary | ICD-10-CM | POA: Diagnosis present

## 2014-09-27 DIAGNOSIS — Z79899 Other long term (current) drug therapy: Secondary | ICD-10-CM | POA: Insufficient documentation

## 2014-09-27 DIAGNOSIS — M8580 Other specified disorders of bone density and structure, unspecified site: Secondary | ICD-10-CM | POA: Insufficient documentation

## 2014-09-27 DIAGNOSIS — K529 Noninfective gastroenteritis and colitis, unspecified: Secondary | ICD-10-CM | POA: Insufficient documentation

## 2014-09-27 DIAGNOSIS — Z7951 Long term (current) use of inhaled steroids: Secondary | ICD-10-CM | POA: Insufficient documentation

## 2014-09-27 DIAGNOSIS — M459 Ankylosing spondylitis of unspecified sites in spine: Secondary | ICD-10-CM | POA: Diagnosis not present

## 2014-09-27 DIAGNOSIS — J449 Chronic obstructive pulmonary disease, unspecified: Secondary | ICD-10-CM | POA: Insufficient documentation

## 2014-09-27 DIAGNOSIS — K904 Malabsorption due to intolerance, not elsewhere classified: Secondary | ICD-10-CM | POA: Insufficient documentation

## 2014-09-27 DIAGNOSIS — M069 Rheumatoid arthritis, unspecified: Secondary | ICD-10-CM | POA: Diagnosis not present

## 2014-09-27 DIAGNOSIS — K279 Peptic ulcer, site unspecified, unspecified as acute or chronic, without hemorrhage or perforation: Secondary | ICD-10-CM | POA: Diagnosis not present

## 2014-09-27 DIAGNOSIS — I10 Essential (primary) hypertension: Secondary | ICD-10-CM | POA: Diagnosis not present

## 2014-09-27 DIAGNOSIS — K5730 Diverticulosis of large intestine without perforation or abscess without bleeding: Secondary | ICD-10-CM | POA: Diagnosis not present

## 2014-09-27 DIAGNOSIS — D509 Iron deficiency anemia, unspecified: Secondary | ICD-10-CM | POA: Diagnosis not present

## 2014-09-27 HISTORY — DX: Depression, unspecified: F32.A

## 2014-09-27 HISTORY — DX: Major depressive disorder, single episode, unspecified: F32.9

## 2014-09-27 HISTORY — DX: Peptic ulcer, site unspecified, unspecified as acute or chronic, without hemorrhage or perforation: K27.9

## 2014-09-27 HISTORY — DX: Essential (primary) hypertension: I10

## 2014-09-27 HISTORY — DX: Anemia, unspecified: D64.9

## 2014-09-27 HISTORY — PX: COLONOSCOPY WITH PROPOFOL: SHX5780

## 2014-09-27 HISTORY — DX: Acute myocardial infarction, unspecified: I21.9

## 2014-09-27 HISTORY — DX: Other specified disorders of bone density and structure, unspecified site: M85.80

## 2014-09-27 HISTORY — DX: Hyperlipidemia, unspecified: E78.5

## 2014-09-27 SURGERY — COLONOSCOPY WITH PROPOFOL
Anesthesia: General

## 2014-09-27 MED ORDER — PROPOFOL INFUSION 10 MG/ML OPTIME
INTRAVENOUS | Status: DC | PRN
Start: 2014-09-27 — End: 2014-09-27
  Administered 2014-09-27: 100 ug/kg/min via INTRAVENOUS
  Administered 2014-09-27: 140 ug/kg/min via INTRAVENOUS

## 2014-09-27 MED ORDER — FENTANYL CITRATE (PF) 100 MCG/2ML IJ SOLN
INTRAMUSCULAR | Status: DC | PRN
  Administered 2014-09-27: 50 ug via INTRAVENOUS

## 2014-09-27 MED ORDER — LIDOCAINE HCL (CARDIAC) 20 MG/ML IV SOLN
INTRAVENOUS | Status: DC | PRN
  Administered 2014-09-27: 30 mg via INTRAVENOUS

## 2014-09-27 MED ORDER — SODIUM CHLORIDE 0.9 % IV SOLN
INTRAVENOUS | Status: DC
  Administered 2014-09-27: 08:00:00 via INTRAVENOUS

## 2014-09-27 MED ORDER — MIDAZOLAM HCL 5 MG/5ML IJ SOLN
INTRAMUSCULAR | Status: DC | PRN
  Administered 2014-09-27: 1 mg via INTRAVENOUS

## 2014-09-27 MED ORDER — PROPOFOL 10 MG/ML IV BOLUS
INTRAVENOUS | Status: DC | PRN
  Administered 2014-09-27: 50 mg via INTRAVENOUS

## 2014-09-27 NOTE — Anesthesia Postprocedure Evaluation (Signed)
  Anesthesia Post-op Note  Patient: Raven [name]  Procedure(s) Performed: Procedure(s): COLONOSCOPY WITH PROPOFOL (N/A)  Anesthesia type:General  Patient location: PACU  Post pain: Pain level controlled  Post assessment: Post-op Vital signs reviewed, Patient's Cardiovascular Status Stable, Respiratory Function Stable, Patent Airway and No signs of Nausea or vomiting  Post vital signs: Reviewed and stable  Last Vitals:  Filed Vitals:   09/27/14 0834  BP: 141/61  Pulse: 84  Temp: 36.1 C  Resp: 14    Level of consciousness: awake, alert  and patient cooperative  Complications: No apparent anesthesia complications

## 2014-09-27 NOTE — Anesthesia Postprocedure Evaluation (Signed)
  Anesthesia Post-op Note  Patient: Raven [name]  Procedure(s) Performed: Procedure(s): COLONOSCOPY WITH PROPOFOL (N/A)  Anesthesia type:General  Patient location: PACU  Post pain: Pain level controlled  Post assessment: Post-op Vital signs reviewed, Patient's Cardiovascular Status Stable, Respiratory Function Stable, Patent Airway and No signs of Nausea or vomiting  Post vital signs: Reviewed and stable  Last Vitals:  Filed Vitals:   09/27/14 0850  BP:   Pulse: 78  Temp:   Resp: 16    Level of consciousness: awake, alert  and patient cooperative  Complications: No apparent anesthesia complications

## 2014-09-27 NOTE — Transfer of Care (Signed)
Immediate Anesthesia Transfer of Care Note  Patient: Raven [name]  Procedure(s) Performed: Procedure(s): COLONOSCOPY WITH PROPOFOL (N/A)  Patient Location: PACU and Short Stay  Anesthesia Type:General  Level of Consciousness: awake and patient cooperative  Airway & Oxygen Therapy: Patient Spontanous Breathing and Patient connected to nasal cannula oxygen  Post-op Assessment: Report given to RN  Post vital signs: Reviewed and stable  Last Vitals:  Filed Vitals:   09/27/14 0834  BP: 141/61  Pulse: 84  Temp: 36.1 C  Resp: 14    Complications: No apparent anesthesia complications

## 2014-09-27 NOTE — Anesthesia Preprocedure Evaluation (Signed)
Anesthesia Evaluation  Patient identified by MRN, date of birth, ID band Patient awake    Reviewed: Allergy & Precautions, NPO status   History of Anesthesia Complications Negative for: history of anesthetic complications  Airway Mallampati: II       Dental  (+) Teeth Intact   Pulmonary COPD oxygen dependent, former smoker,    + decreased breath sounds      Cardiovascular hypertension, Pt. on home beta blockers + Cardiac Stents Normal cardiovascular exam    Neuro/Psych Depression    GI/Hepatic Neg liver ROS, PUD,   Endo/Other  negative endocrine ROS  Renal/GU negative Renal ROS     Musculoskeletal  (+) Arthritis -,   Abdominal (+) + obese,   Peds  Hematology  (+) anemia ,   Anesthesia Other Findings   Reproductive/Obstetrics                             Anesthesia Physical Anesthesia Plan  ASA: III  Anesthesia Plan: General   Post-op Pain Management:    Induction: Intravenous  Airway Management Planned: Nasal Cannula  Additional Equipment:   Intra-op Plan:   Post-operative Plan:   Informed Consent: I have reviewed the patients History and Physical, chart, labs and discussed the procedure including the risks, benefits and alternatives for the proposed anesthesia with the patient or authorized representative who has indicated his/her understanding and acceptance.     Plan Discussed with: CRNA  Anesthesia Plan Comments:         Anesthesia Quick Evaluation

## 2014-09-27 NOTE — H&P (Signed)
  Date of Initial H&P: 08/30/2014 History reviewed, patient examined, no change in status, stable for surgery.

## 2014-09-27 NOTE — Op Note (Signed)
[facility] Patient Name: Raven [name] Procedure Date: 09/27/2014 7:40 AM MRN: [id number] Account #: [id number] Date of Birth: May 11, 1934 Admit Type: Outpatient Age: 80 Room: [facility] ENDO ROOM 4 Gender: Female Note Status: Finalized Procedure:         Colonoscopy Indications:       Follow-up of Crohn's disease Providers:         Lupita Dawn. Candace Cruise, MD Referring MD:      Hewitt Blade. Sarina Ser, MD (Referring MD) Medicines:         Monitored Anesthesia Care Complications:     No immediate complications. Procedure:         Pre-Anesthesia Assessment:                    - Prior to the procedure, a History and Physical was                     performed, and patient medications, allergies and                     sensitivities were reviewed. The patient's tolerance of                     previous anesthesia was reviewed.                    - After reviewing the risks and benefits, the patient was                     deemed in satisfactory condition to undergo the procedure.                    After obtaining informed consent, the colonoscope was                     passed under direct vision. Throughout the procedure, the                     patient's blood pressure, pulse, and oxygen saturations                     were monitored continuously. The Colonoscope was                     introduced through the and advanced to the the terminal                     ileum, with identification of the appendiceal orifice and                     IC valve. The colonoscopy was performed without                     difficulty. The patient tolerated the procedure well. The                     quality of the bowel preparation was fair. Findings:      A few small and large-mouthed diverticula were found in the sigmoid       colon.      The exam was otherwise without abnormality.      Localized inflammation, mild in severity and characterized by erythema       was found in the distal  ileum. Biopsies were taken with a cold forceps  for histology. Polyp or protruberant ileum at ileocecal valve. Soft but       hard to grab. The polyp was removed with a hot snare. Polyp resection       was incomplete. The resected tissue was retrieved. Impression:        - Diverticulosis in the sigmoid colon.                    - The examination was otherwise normal.                    - Crohn's disease with ileitis. Biopsied. Recommendation:    - Discharge patient to home.                    - Continue present medications.                    - Await pathology results.                    - The findings and recommendations were discussed with the                     patient.                    - Continue present medications. Procedure Code(s): --- Professional ---                    [phone], Colonoscopy, flexible; with removal of tumor(s),                     polyp(s), or other lesion(s) by snare technique Diagnosis Code(s): --- Professional ---                    K50.00, Crohn's disease of small intestine without                     complications                    K57.30, Diverticulosis of large intestine without                     perforation or abscess without bleeding CPT copyright 2014 American [employer]. All rights reserved. The codes documented in this report are preliminary and upon coder review may  be revised to meet current compliance requirements. [name], MD 09/27/2014 8:33:22 AM This report has been signed electronically. Number of Addenda: 0 Note Initiated On: 09/27/2014 7:40 AM Scope Withdrawal Time: 0 hours 8 minutes 22 seconds  Total Procedure Duration: 0 hours 12 minutes 12 seconds       [facility]

## 2014-09-28 LAB — SURGICAL PATHOLOGY

## 2014-10-01 ENCOUNTER — Encounter: Payer: Self-pay | Admitting: Gastroenterology

## 2014-10-10 ENCOUNTER — Encounter: Payer: Self-pay | Admitting: Podiatry

## 2014-10-10 ENCOUNTER — Ambulatory Visit (INDEPENDENT_AMBULATORY_CARE_PROVIDER_SITE_OTHER): Payer: Medicare Other | Admitting: Podiatry

## 2014-10-10 VITALS — BP 140/73 | HR 98 | Resp 16

## 2014-10-10 DIAGNOSIS — Q828 Other specified congenital malformations of skin: Secondary | ICD-10-CM

## 2014-10-10 DIAGNOSIS — T148 Other injury of unspecified body region: Secondary | ICD-10-CM

## 2014-10-10 DIAGNOSIS — W57XXXA Bitten or stung by nonvenomous insect and other nonvenomous arthropods, initial encounter: Secondary | ICD-10-CM

## 2014-10-10 NOTE — Progress Notes (Signed)
She presents today concerned about a blister and rash to the dorsal aspect of the left foot. This has been going on for a little while now on the blister seems to be getting smaller and has resolved. She also is concerned of calluses plantar aspect of bilateral foot as well as a rash to the lateral aspect of the right foot.  Objective: Vital signs are stable she is alert and oriented 3. Pulses remain palpable bilateral. Her chief concern of a blister to the dorsal aspect of the left foot demonstrates an area which appears to be a insect bite which blistered she does not demonstrate any signs of systemic infection nor does she demonstrate any cellulitis. The rash to the plantar aspect of the right foot and plantar lateral aspect of the right foot does demonstrates tinea pedis which is associated more than likely with the onychomycosis of the same foot. Porokeratosis and calluses to the lesser digits and plantar aspect of foot are also prominent these were debrided today without iatrogenic lesions.  Assessment: Insect bite resolving hallux left. Tinea pedis bilateral. Porokeratosis and callus bilateral.  Plan: Debride all reactive hyperkeratoses. Encouraged soaking in Epsom salts and warm water for the hallux left. Suggested she purchase Lamisil AT over-the-counter and I will follow-up with her in a few weeks.

## 2014-11-02 ENCOUNTER — Encounter: Payer: Self-pay | Admitting: Emergency Medicine

## 2014-11-02 ENCOUNTER — Emergency Department: Payer: Medicare Other

## 2014-11-02 ENCOUNTER — Inpatient Hospital Stay
Admission: EM | Admit: 2014-11-02 | Discharge: 2014-11-08 | DRG: 871 | Disposition: A | Discharge status: Home / Self Care | Payer: Medicare Other | Attending: Internal Medicine | Admitting: Internal Medicine

## 2014-11-02 DIAGNOSIS — M8580 Other specified disorders of bone density and structure, unspecified site: Secondary | ICD-10-CM | POA: Diagnosis present

## 2014-11-02 DIAGNOSIS — D649 Anemia, unspecified: Secondary | ICD-10-CM | POA: Diagnosis present

## 2014-11-02 DIAGNOSIS — Z7902 Long term (current) use of antithrombotics/antiplatelets: Secondary | ICD-10-CM | POA: Diagnosis not present

## 2014-11-02 DIAGNOSIS — Z8711 Personal history of peptic ulcer disease: Secondary | ICD-10-CM | POA: Diagnosis not present

## 2014-11-02 DIAGNOSIS — Z825 Family history of asthma and other chronic lower respiratory diseases: Secondary | ICD-10-CM | POA: Diagnosis not present

## 2014-11-02 DIAGNOSIS — E876 Hypokalemia: Secondary | ICD-10-CM | POA: Diagnosis present

## 2014-11-02 DIAGNOSIS — J45909 Unspecified asthma, uncomplicated: Secondary | ICD-10-CM | POA: Diagnosis present

## 2014-11-02 DIAGNOSIS — Z79899 Other long term (current) drug therapy: Secondary | ICD-10-CM

## 2014-11-02 DIAGNOSIS — K5090 Crohn's disease, unspecified, without complications: Secondary | ICD-10-CM | POA: Diagnosis present

## 2014-11-02 DIAGNOSIS — M1990 Unspecified osteoarthritis, unspecified site: Secondary | ICD-10-CM | POA: Diagnosis present

## 2014-11-02 DIAGNOSIS — E782 Mixed hyperlipidemia: Secondary | ICD-10-CM | POA: Diagnosis present

## 2014-11-02 DIAGNOSIS — J189 Pneumonia, unspecified organism: Secondary | ICD-10-CM | POA: Diagnosis present

## 2014-11-02 DIAGNOSIS — I252 Old myocardial infarction: Secondary | ICD-10-CM | POA: Diagnosis not present

## 2014-11-02 DIAGNOSIS — R0902 Hypoxemia: Secondary | ICD-10-CM

## 2014-11-02 DIAGNOSIS — Z955 Presence of coronary angioplasty implant and graft: Secondary | ICD-10-CM | POA: Diagnosis not present

## 2014-11-02 DIAGNOSIS — Z87891 Personal history of nicotine dependence: Secondary | ICD-10-CM | POA: Diagnosis not present

## 2014-11-02 DIAGNOSIS — F329 Major depressive disorder, single episode, unspecified: Secondary | ICD-10-CM | POA: Diagnosis present

## 2014-11-02 DIAGNOSIS — J441 Chronic obstructive pulmonary disease with (acute) exacerbation: Secondary | ICD-10-CM | POA: Diagnosis present

## 2014-11-02 DIAGNOSIS — Z7952 Long term (current) use of systemic steroids: Secondary | ICD-10-CM | POA: Diagnosis not present

## 2014-11-02 DIAGNOSIS — I2510 Atherosclerotic heart disease of native coronary artery without angina pectoris: Secondary | ICD-10-CM | POA: Diagnosis present

## 2014-11-02 DIAGNOSIS — A419 Sepsis, unspecified organism: Principal | ICD-10-CM | POA: Diagnosis present

## 2014-11-02 DIAGNOSIS — R0602 Shortness of breath: Secondary | ICD-10-CM

## 2014-11-02 DIAGNOSIS — J9621 Acute and chronic respiratory failure with hypoxia: Secondary | ICD-10-CM | POA: Diagnosis present

## 2014-11-02 DIAGNOSIS — I10 Essential (primary) hypertension: Secondary | ICD-10-CM | POA: Diagnosis present

## 2014-11-02 LAB — URINALYSIS COMPLETE WITH MICROSCOPIC (ARMC ONLY)
Bacteria, UA: NONE SEEN
Bilirubin Urine: NEGATIVE
Glucose, UA: NEGATIVE mg/dL
Hgb urine dipstick: NEGATIVE
Nitrite: NEGATIVE
Protein, ur: NEGATIVE mg/dL
RBC / HPF: NONE SEEN RBC/hpf (ref 0–5)
Specific Gravity, Urine: 1.018 (ref 1.005–1.030)
pH: 5 (ref 5.0–8.0)

## 2014-11-02 LAB — COMPREHENSIVE METABOLIC PANEL
ALT: 21 U/L (ref 14–54)
AST: 28 U/L (ref 15–41)
Albumin: 4 g/dL (ref 3.5–5.0)
Alkaline Phosphatase: 70 U/L (ref 38–126)
Anion gap: 11 (ref 5–15)
BUN: 19 mg/dL (ref 6–20)
CO2: 29 mmol/L (ref 22–32)
Calcium: 9 mg/dL (ref 8.9–10.3)
Chloride: 99 mmol/L — ABNORMAL LOW (ref 101–111)
Creatinine, Ser: 0.92 mg/dL (ref 0.44–1.00)
GFR calc Af Amer: >60 mL/min (ref >60)
GFR calc non Af Amer: 58 mL/min — ABNORMAL LOW (ref >60)
Glucose, Bld: 121 mg/dL — ABNORMAL HIGH (ref 65–99)
Potassium: 3 mmol/L — ABNORMAL LOW (ref 3.5–5.1)
Sodium: 139 mmol/L (ref 135–145)
Total Bilirubin: 1 mg/dL (ref 0.3–1.2)
Total Protein: 7 g/dL (ref 6.5–8.1)

## 2014-11-02 LAB — CBC WITH DIFFERENTIAL/PLATELET
Basophils Absolute: 0.2 10*3/uL — ABNORMAL HIGH (ref 0–0.1)
Basophils Relative: 1 %
Eosinophils Absolute: 0.1 10*3/uL (ref 0–0.7)
Eosinophils Relative: 1 %
HCT: 39.4 % (ref 35.0–47.0)
Hemoglobin: 12.9 g/dL (ref 12.0–16.0)
Lymphocytes Relative: 11 %
Lymphs Abs: 2 10*3/uL (ref 1.0–3.6)
MCH: 28.6 pg (ref 26.0–34.0)
MCHC: 32.7 g/dL (ref 32.0–36.0)
MCV: 87.5 fL (ref 80.0–100.0)
Monocytes Absolute: 1.4 10*3/uL — ABNORMAL HIGH (ref 0.2–0.9)
Monocytes Relative: 8 %
Neutro Abs: 13.7 10*3/uL — ABNORMAL HIGH (ref 1.4–6.5)
Neutrophils Relative %: 79 %
Platelets: 197 10*3/uL (ref 150–440)
RBC: 4.5 MIL/uL (ref 3.80–5.20)
RDW: 14.3 % (ref 11.5–14.5)
WBC: 17.4 10*3/uL — ABNORMAL HIGH (ref 3.6–11.0)

## 2014-11-02 LAB — TROPONIN I: Troponin I: 0.05 ng/mL — ABNORMAL HIGH (ref <0.031)

## 2014-11-02 LAB — MAGNESIUM: Magnesium: 1.3 mg/dL — ABNORMAL LOW (ref 1.7–2.4)

## 2014-11-02 LAB — LACTIC ACID, PLASMA
Lactic Acid, Venous: 1.9 mmol/L (ref 0.5–2.0)
Lactic Acid, Venous: 1.7 mmol/L (ref 0.5–2.0)

## 2014-11-02 MED ORDER — HYDROCHLOROTHIAZIDE 25 MG PO TABS
25.0000 mg | ORAL_TABLET | Freq: Every day | ORAL | Status: DC
  Administered 2014-11-05 – 2014-11-08 (×4): 25 mg via ORAL
  Filled 2014-11-02 (×4): qty 1

## 2014-11-02 MED ORDER — ADULT MULTIVITAMIN W/MINERALS CH
1.0000 | ORAL_TABLET | Freq: Every day | ORAL | Status: DC
  Administered 2014-11-02 – 2014-11-08 (×2): 1 via ORAL
  Filled 2014-11-02 (×3): qty 1

## 2014-11-02 MED ORDER — VITAMIN D-3 25 MCG (1000 UT) PO CAPS: 1.0000 | ORAL_CAPSULE | Freq: Every day | ORAL | Status: DC

## 2014-11-02 MED ORDER — LEFLUNOMIDE 10 MG PO TABS: 10.0000 mg | ORAL_TABLET | Freq: Every day | ORAL | Status: DC

## 2014-11-02 MED ORDER — FLUTICASONE-SALMETEROL 115-21 MCG/ACT IN AERO: 2.0000 | INHALATION_SPRAY | Freq: Two times a day (BID) | RESPIRATORY_TRACT | Status: DC

## 2014-11-02 MED ORDER — DEXTROSE 5 % IV SOLN
500.0000 mg | Freq: Once | INTRAVENOUS | Status: AC
  Administered 2014-11-02: 500 mg via INTRAVENOUS
  Filled 2014-11-02: qty 500

## 2014-11-02 MED ORDER — SENNOSIDES-DOCUSATE SODIUM 8.6-50 MG PO TABS: 1.0000 | ORAL_TABLET | Freq: Every evening | ORAL | Status: DC | PRN

## 2014-11-02 MED ORDER — LEFLUNOMIDE 20 MG PO TABS
10.0000 mg | ORAL_TABLET | Freq: Every day | ORAL | Status: DC
  Administered 2014-11-02 – 2014-11-07 (×3): 10 mg via ORAL
  Filled 2014-11-02 (×7): qty 1

## 2014-11-02 MED ORDER — RISEDRONATE SODIUM 150 MG PO TABS: 150.0000 mg | ORAL_TABLET | ORAL | Status: DC

## 2014-11-02 MED ORDER — SENNA 8.6 MG PO TABS
1.0000 | ORAL_TABLET | Freq: Every day | ORAL | Status: DC
  Administered 2014-11-02 – 2014-11-08 (×4): 8.6 mg via ORAL
  Filled 2014-11-02 (×4): qty 1

## 2014-11-02 MED ORDER — LEVOFLOXACIN IN D5W 750 MG/150ML IV SOLN
750.0000 mg | INTRAVENOUS | Status: DC
  Administered 2014-11-02 – 2014-11-05 (×4): 750 mg via INTRAVENOUS
  Filled 2014-11-02 (×5): qty 150

## 2014-11-02 MED ORDER — AMITRIPTYLINE HCL 10 MG PO TABS
20.0000 mg | ORAL_TABLET | Freq: Every day | ORAL | Status: DC
  Administered 2014-11-04 – 2014-11-07 (×4): 20 mg via ORAL
  Filled 2014-11-02 (×5): qty 2

## 2014-11-02 MED ORDER — POTASSIUM CHLORIDE CRYS ER 10 MEQ PO TBCR
10.0000 meq | EXTENDED_RELEASE_TABLET | Freq: Two times a day (BID) | ORAL | Status: DC
  Administered 2014-11-02: 10 meq via ORAL
  Filled 2014-11-02 (×2): qty 1

## 2014-11-02 MED ORDER — ONDANSETRON HCL 4 MG PO TABS: 4.0000 mg | ORAL_TABLET | Freq: Four times a day (QID) | ORAL | Status: DC | PRN

## 2014-11-02 MED ORDER — BENAZEPRIL HCL 20 MG PO TABS
20.0000 mg | ORAL_TABLET | Freq: Every day | ORAL | Status: DC
  Administered 2014-11-05 – 2014-11-08 (×4): 20 mg via ORAL
  Filled 2014-11-02 (×5): qty 1

## 2014-11-02 MED ORDER — ACETAMINOPHEN 500 MG PO TABS
1000.0000 mg | ORAL_TABLET | Freq: Once | ORAL | Status: AC
  Administered 2014-11-02: 1000 mg via ORAL
  Filled 2014-11-02: qty 2

## 2014-11-02 MED ORDER — SODIUM CHLORIDE 0.9 % IV SOLN
Freq: Once | INTRAVENOUS | Status: AC
  Administered 2014-11-02: 10:00:00 via INTRAVENOUS

## 2014-11-02 MED ORDER — SODIUM CHLORIDE 0.9 % IJ SOLN
3.0000 mL | Freq: Two times a day (BID) | INTRAMUSCULAR | Status: DC
  Administered 2014-11-02 – 2014-11-05 (×4): 3 mL via INTRAVENOUS

## 2014-11-02 MED ORDER — DEXTROSE 5 % IV SOLN
1.0000 g | Freq: Once | INTRAVENOUS | Status: AC
  Administered 2014-11-02: 1 g via INTRAVENOUS
  Filled 2014-11-02: qty 10

## 2014-11-02 MED ORDER — CLOPIDOGREL BISULFATE 75 MG PO TABS
75.0000 mg | ORAL_TABLET | Freq: Every day | ORAL | Status: DC
  Administered 2014-11-02 – 2014-11-08 (×5): 75 mg via ORAL
  Filled 2014-11-02 (×5): qty 1

## 2014-11-02 MED ORDER — ALBUTEROL SULFATE HFA 108 (90 BASE) MCG/ACT IN AERS: 2.0000 | INHALATION_SPRAY | Freq: Four times a day (QID) | RESPIRATORY_TRACT | Status: DC | PRN

## 2014-11-02 MED ORDER — MULTIVITAMINS PO CAPS: 1.0000 | ORAL_CAPSULE | Freq: Every day | ORAL | Status: DC

## 2014-11-02 MED ORDER — AMLODIPINE BESYLATE 5 MG PO TABS
5.0000 mg | ORAL_TABLET | Freq: Every day | ORAL | Status: DC
Start: 2014-11-02 — End: 2014-11-08
  Administered 2014-11-05 – 2014-11-08 (×4): 5 mg via ORAL
  Filled 2014-11-02 (×4): qty 1

## 2014-11-02 MED ORDER — MESALAMINE ER 250 MG PO CPCR
2000.0000 mg | ORAL_CAPSULE | Freq: Two times a day (BID) | ORAL | Status: DC
  Administered 2014-11-02: 2000 mg via ORAL
  Filled 2014-11-02 (×14): qty 8

## 2014-11-02 MED ORDER — ENOXAPARIN SODIUM 40 MG/0.4ML ~~LOC~~ SOLN
40.0000 mg | SUBCUTANEOUS | Status: DC
  Administered 2014-11-02 – 2014-11-07 (×6): 40 mg via SUBCUTANEOUS
  Filled 2014-11-02 (×6): qty 0.4

## 2014-11-02 MED ORDER — CYANOCOBALAMIN 500 MCG PO TABS
500.0000 ug | ORAL_TABLET | Freq: Every day | ORAL | Status: DC
  Administered 2014-11-02 – 2014-11-07 (×2): 500 ug via ORAL
  Filled 2014-11-02 (×4): qty 1

## 2014-11-02 MED ORDER — MESALAMINE ER 250 MG PO CPCR: 2000.0000 mg | ORAL_CAPSULE | Freq: Two times a day (BID) | ORAL | Status: DC

## 2014-11-02 MED ORDER — AMLODIPINE BESY-BENAZEPRIL HCL 5-20 MG PO CAPS: 1.0000 | ORAL_CAPSULE | Freq: Every day | ORAL | Status: DC

## 2014-11-02 MED ORDER — VITAMIN B12-FOLIC ACID 500-400 MCG PO TABS: 1.0000 | ORAL_TABLET | Freq: Every day | ORAL | Status: DC

## 2014-11-02 MED ORDER — POTASSIUM CHLORIDE 20 MEQ/15ML (10%) PO SOLN
40.0000 meq | Freq: Once | ORAL | Status: AC
  Administered 2014-11-02: 40 meq via ORAL
  Filled 2014-11-02 (×2): qty 30

## 2014-11-02 MED ORDER — PREDNISONE 5 MG PO TABS: 5.0000 mg | ORAL_TABLET | Freq: Every day | ORAL | Status: DC

## 2014-11-02 MED ORDER — SENNOSIDES 8.6 MG PO TABS: 1.0000 | ORAL_TABLET | Freq: Every day | ORAL | Status: DC

## 2014-11-02 MED ORDER — ALUM & MAG HYDROXIDE-SIMETH 200-200-20 MG/5ML PO SUSP
30.0000 mL | Freq: Four times a day (QID) | ORAL | Status: DC | PRN
Start: 2014-11-02 — End: 2014-11-08
  Administered 2014-11-06: 30 mL via ORAL
  Filled 2014-11-02: qty 30

## 2014-11-02 MED ORDER — CETYLPYRIDINIUM CHLORIDE 0.05 % MT LIQD
7.0000 mL | Freq: Two times a day (BID) | OROMUCOSAL | Status: DC
  Administered 2014-11-02 – 2014-11-05 (×3): 7 mL via OROMUCOSAL
  Filled 2014-11-02: qty 7

## 2014-11-02 MED ORDER — FOLIC ACID 1 MG PO TABS
0.5000 mg | ORAL_TABLET | Freq: Every day | ORAL | Status: DC
Start: 2014-11-02 — End: 2014-11-08
  Administered 2014-11-02 – 2014-11-08 (×3): 0.5 mg via ORAL
  Filled 2014-11-02 (×4): qty 1

## 2014-11-02 MED ORDER — ALBUTEROL SULFATE (2.5 MG/3ML) 0.083% IN NEBU: 2.5000 mg | INHALATION_SOLUTION | Freq: Four times a day (QID) | RESPIRATORY_TRACT | Status: DC | PRN

## 2014-11-02 MED ORDER — VITAMIN D 1000 UNITS PO TABS
1000.0000 [IU] | ORAL_TABLET | Freq: Every day | ORAL | Status: DC
  Administered 2014-11-02 – 2014-11-08 (×2): 1000 [IU] via ORAL
  Filled 2014-11-02 (×4): qty 1

## 2014-11-02 MED ORDER — ACETAMINOPHEN 325 MG PO TABS
650.0000 mg | ORAL_TABLET | Freq: Four times a day (QID) | ORAL | Status: DC | PRN
  Administered 2014-11-03 – 2014-11-05 (×6): 650 mg via ORAL
  Filled 2014-11-02 (×6): qty 2

## 2014-11-02 MED ORDER — METOPROLOL SUCCINATE ER 25 MG PO TB24
25.0000 mg | ORAL_TABLET | Freq: Every day | ORAL | Status: DC
  Administered 2014-11-04 – 2014-11-08 (×5): 25 mg via ORAL
  Filled 2014-11-02 (×5): qty 1

## 2014-11-02 MED ORDER — ASPIRIN EC 81 MG PO TBEC
81.0000 mg | DELAYED_RELEASE_TABLET | Freq: Every day | ORAL | Status: DC
  Administered 2014-11-02 – 2014-11-08 (×5): 81 mg via ORAL
  Filled 2014-11-02 (×5): qty 1

## 2014-11-02 MED ORDER — ACETAMINOPHEN 650 MG RE SUPP
650.0000 mg | Freq: Four times a day (QID) | RECTAL | Status: DC | PRN
  Filled 2014-11-02: qty 1

## 2014-11-02 MED ORDER — NITROGLYCERIN 0.4 MG SL SUBL: 0.4000 mg | SUBLINGUAL_TABLET | SUBLINGUAL | Status: DC | PRN

## 2014-11-02 MED ORDER — ONDANSETRON HCL 4 MG/2ML IJ SOLN
4.0000 mg | Freq: Four times a day (QID) | INTRAMUSCULAR | Status: DC | PRN
Start: 2014-11-02 — End: 2014-11-08
  Administered 2014-11-02 – 2014-11-03 (×2): 4 mg via INTRAVENOUS
  Filled 2014-11-02 (×2): qty 2

## 2014-11-02 MED ORDER — ATORVASTATIN CALCIUM 20 MG PO TABS
80.0000 mg | ORAL_TABLET | Freq: Every day | ORAL | Status: DC
  Administered 2014-11-02 – 2014-11-08 (×5): 80 mg via ORAL
  Filled 2014-11-02 (×5): qty 4

## 2014-11-02 MED ORDER — MOMETASONE FURO-FORMOTEROL FUM 100-5 MCG/ACT IN AERO
2.0000 | INHALATION_SPRAY | Freq: Two times a day (BID) | RESPIRATORY_TRACT | Status: DC
  Administered 2014-11-02 – 2014-11-08 (×11): 2 via RESPIRATORY_TRACT
  Filled 2014-11-02: qty 8.8

## 2014-11-02 MED ORDER — SODIUM CHLORIDE 0.9 % IV SOLN
INTRAVENOUS | Status: DC
  Administered 2014-11-02 – 2014-11-03 (×2): via INTRAVENOUS

## 2014-11-02 NOTE — ED Provider Notes (Addendum)
[facility] Emergency Department Provider Note     Time seen: ----------------------------------------- 8:38 AM on 11/02/2014 -----------------------------------------    I have reviewed the triage vital signs and the nursing notes.   HISTORY  Chief Complaint Fever    HPI Raven [name] is a 80 y.o. female who presents to ER being brought by EMS for fever and weakness. Son found her weak lying in bed this morning. She lives at home, has had fever and complains of sore throat and cough. Other than generalized weakness, denies complaints. Patient denies history of same. Fever seemed to start in the last 24 hours.   Past Medical History  Diagnosis Date  . Arthritis   . Asthma   . COPD (chronic obstructive pulmonary disease)   . Crohn's disease   . Pneumonia   . Hypertension   . Hyperlipidemia   . Myocardial infarction   . Peptic ulcer disease   . Osteopenia   . Depression   . Anemia     There are no active problems to display for this patient.   Past Surgical History  Procedure Laterality Date  . Abdominal surgery    . Small intestine surgery    . Shoulder surgery Right     x 2   . Cardiac catheterization    . Colonoscopy with propofol N/A 09/27/2014    Procedure: COLONOSCOPY WITH PROPOFOL;  Surgeon: [name], MD;  Location: [facility] ENDOSCOPY;  Service: Gastroenterology;  Laterality: N/A;    Allergies Augmentin; Indocin; Lodine; Methotrexate derivatives; and Zantac  Social History Social History  Substance Use Topics  . Smoking status: Former Research scientist (life sciences)  . Smokeless tobacco: Not on file  . Alcohol Use: No    Review of Systems Constitutional: Positive for fever Eyes: Negative for visual changes. ENT: Positive for sore throat Cardiovascular: Negative for chest pain. Respiratory: Negative for shortness of breath. Positive for cough Gastrointestinal: Negative for abdominal pain, vomiting and diarrhea. Genitourinary: Negative for  dysuria. Musculoskeletal: Negative for back pain. Skin: Negative for rash. Neurological: Negative for headaches, positive for weakness  10-point ROS otherwise negative.  ____________________________________________   PHYSICAL EXAM:  VITAL SIGNS: ED Triage Vitals  Enc Vitals Group     BP --      Pulse --      Resp --      Temp --      Temp src --      SpO2 --      Weight --      Height --      Head Cir --      Peak Flow --      Pain Score --      Pain Loc --      Pain Edu? --      Excl. in [place]? --     Constitutional: Alert and oriented. Mild distress Eyes: Conjunctivae are normal. PERRL. Normal extraocular movements. ENT   Head: Normocephalic and atraumatic.   Nose: No congestion/rhinnorhea.   Mouth/Throat: Mucous membranes are moist.   Neck: No stridor. Cardiovascular: Rapid rate, regular rhythm. Normal and symmetric distal pulses are present in all extremities. No murmurs, rubs, or gallops. Respiratory: Normal respiratory effort without tachypnea nor retractions. Breath sounds are clear and equal bilaterally. No wheezes/rales/rhonchi. Gastrointestinal: Soft and nontender. No distention. No abdominal bruits.  Musculoskeletal: Nontender with normal range of motion in all extremities. No joint effusions.  No lower extremity tenderness nor edema. Neurologic:  Normal speech and language. No gross focal  neurologic deficits are appreciated.  Skin:  Skin is warm, dry and intact. No rash noted. Psychiatric: Mood and affect are normal. ____________________________________________  EKG: Interpreted by me. Normal sinus rhythm with a rate of 97 bpm, incomplete right bundle branch block, left anterior fascicular block. LVH.  ____________________________________________  ED COURSE:  Pertinent labs & imaging results that were available during my care of the patient were reviewed by me and considered in my medical decision making (see chart for details). Patient will  be a septic workup. Fever of unclear etiology at this time ____________________________________________    LABS (pertinent positives/negatives)  Labs Reviewed  CBC WITH DIFFERENTIAL/PLATELET - Abnormal; Notable for the following:    WBC 17.4 (*)    Neutro Abs 13.7 (*)    Monocytes Absolute 1.4 (*)    Basophils Absolute 0.2 (*)    All other components within normal limits  COMPREHENSIVE METABOLIC PANEL - Abnormal; Notable for the following:    Potassium 3.0 (*)    Chloride 99 (*)    Glucose, Bld 121 (*)    GFR calc non Af Amer 58 (*)    All other components within normal limits  TROPONIN I - Abnormal; Notable for the following:    Troponin I 0.05 (*)    All other components within normal limits  URINE CULTURE  CULTURE, BLOOD (ROUTINE X 2)  CULTURE, BLOOD (ROUTINE X 2)  LACTIC ACID, PLASMA  URINALYSIS COMPLETEWITH MICROSCOPIC ([facility] ONLY)  LACTIC ACID, PLASMA    RADIOLOGY Images were viewed by me  Chest x-ray IMPRESSION: Somewhat rounded area of increased density identified in the left mid lung. There is some suggestion of lucency which may represent some cavitation. This likely represents an acute pneumonia. Followup is recommended in 3-4 weeks following appropriate therapy to assess for resolution. CT of the chest is recommended if the area persists. ____________________________________________  FINAL ASSESSMENT AND PLAN  Fever, community-acquired pneumonia  Plan: Patient with labs and imaging as dictated above. Patient was started on Rocephin and azithromycin here. She appears very weak, room air sats were 74%. She currently only wears oxygen at night. Temperature was 103.1, she was given Tylenol. She will need inpatient observation and IV antibiotics with supplemental oxygen as needed.   Raven Newport, MD   Raven Newport, MD 11/02/14 3785  Raven Newport, MD 11/03/14 2159

## 2014-11-02 NOTE — H&P (Signed)
[place] at [place] NAME: Raven [name]    MR#:  [id number]  DATE OF BIRTH:  1934/03/13  DATE OF ADMISSION:  11/02/2014  PRIMARY CARE PHYSICIAN: [name], MD   REQUESTING/REFERRING PHYSICIAN: Dr. Jimmye Norman  CHIEF COMPLAINT:  Fever and lethargy  HISTORY OF PRESENT ILLNESS:  Raven [name]  is a 80 y.o. female with a known history of COPD who wears oxygen at night, Crohn's disease and essential hypertension who presents with above complaint. Patient was brought in via EMS for fever weakness and lethargy. Patient reports over the past 24 hours she is having symptoms. She states that she has a cough and left-sided chest pain. She denies sick contacts. She denies any travel history. Her weakness is generalized.  PAST MEDICAL HISTORY:   Past Medical History  Diagnosis Date  . Arthritis   . Asthma   . COPD (chronic obstructive pulmonary disease)   . Crohn's disease   . Pneumonia   . Hypertension   . Hyperlipidemia   . Myocardial infarction   . Peptic ulcer disease   . Osteopenia   . Depression   . Anemia     PAST SURGICAL HISTORY:   Past Surgical History  Procedure Laterality Date  . Abdominal surgery    . Small intestine surgery    . Shoulder surgery Right     x 2   . Cardiac catheterization    . Colonoscopy with propofol N/A 09/27/2014    Procedure: COLONOSCOPY WITH PROPOFOL;  Surgeon: [name], MD;  Location: [facility] ENDOSCOPY;  Service: Gastroenterology;  Laterality: N/A;    SOCIAL HISTORY:   Social History  Substance Use Topics  . Smoking status: Former Research scientist (life sciences)  . Smokeless tobacco: Not on file  . Alcohol Use: No    FAMILY HISTORY:  Positive history for COPD  DRUG ALLERGIES:   Allergies  Allergen Reactions  . Penicillins Other (See Comments)  . Augmentin [Amoxicillin-Pot Clavulanate] Other (See Comments)    Gi upset  . Indocin [Indomethacin] Other (See Comments)    unknown  . Lodine [Etodolac]  Other (See Comments)    Gi upset  . Methotrexate Derivatives Other (See Comments)    Gi upset  . Gold Rash  . Zantac [Ranitidine Hcl] Rash     REVIEW OF SYSTEMS:  CONSTITUTIONAL: Positive fever, weakness, fatigue.  EYES: No blurred or double vision.  EARS, NOSE, AND THROAT: No tinnitus or ear pain.  RESPIRATORY: positive  cough, shortness of breath, wheezing  no hemoptysis.  CARDIOVASCULAR: left-sided chest pain  chest pain,  noorthopnea, edema.  GASTROINTESTINAL: No nausea, vomiting, diarrhea or abdominal pain.  GENITOURINARY: No dysuria, hematuria.  ENDOCRINE: No polyuria, nocturia,  HEMATOLOGY: No anemia, easy bruising or bleeding SKIN: No rash or lesion. MUSCULOSKELETAL: No joint pain or arthritis.   NEUROLOGIC: No tingling, numbness, weakness.  PSYCHIATRY: No anxiety or depression.   MEDICATIONS AT HOME:   Prior to Admission medications   Medication Sig Start Date End Date Taking? Authorizing Provider  acetaminophen (TYLENOL) 500 MG tablet Take 500 mg by mouth every 6 (six) hours as needed.   Yes Historical Provider, MD  ACTONEL 150 MG tablet Take 150 mg by mouth every 30 (thirty) days.  05/12/13  Yes Historical Provider, MD  albuterol (PROVENTIL HFA;VENTOLIN HFA) 108 (90 BASE) MCG/ACT inhaler Inhale 2 puffs into the lungs every 6 (six) hours as needed for wheezing or shortness of breath.   Yes Historical Provider, MD  amitriptyline (ELAVIL) 10 MG tablet Take 20 mg by mouth at bedtime.  03/17/13  Yes Historical Provider, MD  amLODipine-benazepril (LOTREL) 5-20 MG per capsule Take 1 capsule by mouth daily.  03/30/13  Yes Historical Provider, MD  aspirin EC 81 MG tablet Take 81 mg by mouth daily.   Yes Historical Provider, MD  atorvastatin (LIPITOR) 80 MG tablet Take 80 mg by mouth daily.   Yes Historical Provider, MD  Calcium Carb-Cholecalciferol (CALCIUM 500+D PO) Take 1 tablet by mouth daily.   Yes Historical Provider, MD  Cholecalciferol (VITAMIN D-3) 1000 UNITS CAPS Take 1  capsule by mouth daily.   Yes Historical Provider, MD  clopidogrel (PLAVIX) 75 MG tablet Take 75 mg by mouth daily.   Yes Historical Provider, MD  Cobalamine Combinations (VITAMIN B12-FOLIC ACID) 616-073 MCG TABS Take 1 tablet by mouth daily.   Yes Historical Provider, MD  Fluticasone-Salmeterol (ADVAIR HFA IN) Inhale into the lungs 2 (two) times daily.   Yes Historical Provider, MD  HUMIRA 40 MG/0.8ML injection Inject 40 mg into the skin every 14 (fourteen) days.  05/05/13  Yes Historical Provider, MD  hydrochlorothiazide (HYDRODIURIL) 25 MG tablet Take 25 mg by mouth daily.  05/03/13  Yes Historical Provider, MD  leflunomide (ARAVA) 10 MG tablet Take 10 mg by mouth daily.  04/12/13  Yes Historical Provider, MD  metoprolol succinate (TOPROL-XL) 25 MG 24 hr tablet Take 25 mg by mouth daily.   Yes Historical Provider, MD  Multiple Vitamin (MULTIVITAMIN) capsule Take 1 capsule by mouth daily.   Yes Historical Provider, MD  NEXIUM 40 MG capsule Take 40 mg by mouth daily at 12 noon.  04/24/13  Yes Historical Provider, MD  nitroGLYCERIN (NITROSTAT) 0.4 MG SL tablet Place 0.4 mg under the tongue every 5 (five) minutes as needed for chest pain.   Yes Historical Provider, MD  ofloxacin (OCUFLOX) 0.3 % ophthalmic solution Place 1 drop into both eyes daily as needed.   Yes Historical Provider, MD  PENTASA 500 MG CR capsule Take 2,000 mg by mouth 2 (two) times daily.  04/14/13  Yes Historical Provider, MD  potassium chloride (K-DUR,KLOR-CON) 10 MEQ tablet Take 10 mEq by mouth 2 (two) times daily.   Yes Historical Provider, MD  predniSONE (DELTASONE) 5 MG tablet Take 5 mg by mouth daily with breakfast.   Yes Historical Provider, MD  senna (SENOKOT) 8.6 MG tablet Take 1 tablet by mouth daily.   Yes Historical Provider, MD      VITAL SIGNS:  Blood pressure 152/61, pulse 91, temperature 103.1 F (39.5 C), temperature source Oral, resp. rate 22, height 5' 7"  (1.702 m), weight 83.915 kg (185 lb), SpO2 74 %.  PHYSICAL  EXAMINATION:  GENERAL:  80 y.o.-year-old patient lying in the bed with no acute distress. appears very tired   EYES: Pupils equal, round, reactive to light and accommodation. No scleral icterus. Extraocular muscles intact.  HEENT: Head atraumatic, normocephalic. Oropharynx and nasopharynx clear.  NECK:  Supple, no jugular venous distention. No thyroid enlargement, no tenderness.  LUNGS: decreased breath sounds bilaterally.  no wheezing, rales,rhonchi or crepitation. No use of accessory muscles of respiration.  CARDIOVASCULAR: S1, S2 normal. No murmurs, rubs, or gallops.  ABDOMEN: Soft, nontender, nondistended. Bowel sounds present. No organomegaly or mass.  EXTREMITIES: No pedal edema, cyanosis, or clubbing.  NEUROLOGIC: Cranial nerves II through XII are grossly intact. No focal deficits. PSYCHIATRIC: The patient is alert and oriented x 3.  SKIN: No obvious rash, lesion, or ulcer.  LABORATORY PANEL:   CBC  Recent Labs Lab 11/02/14 0900  WBC 17.4*  HGB 12.9  HCT 39.4  PLT 197   ------------------------------------------------------------------------------------------------------------------  Chemistries   Recent Labs Lab 11/02/14 0900  NA 139  K 3.0*  CL 99*  CO2 29  GLUCOSE 121*  BUN 19  CREATININE 0.92  CALCIUM 9.0  AST 28  ALT 21  ALKPHOS 70  BILITOT 1.0   ------------------------------------------------------------------------------------------------------------------  Cardiac Enzymes  Recent Labs Lab 11/02/14 0900  TROPONINI 0.05*   ------------------------------------------------------------------------------------------------------------------  RADIOLOGY:  Dg Chest 1 View  11/02/2014   CLINICAL DATA:  Fevers  EXAM: CHEST  1 VIEW  COMPARISON:  06/08/2014  FINDINGS: Cardiac shadow is within normal limits. Elevation the right hemidiaphragm is again seen. In the left perihilar region there is a somewhat rounded area of increased density identified. There  is some central lucency although this may be projectional in nature. The possibility of a cavitary lesion cannot be totally excluded. Given the normal appearing chest x-ray, 4 months ago this likely represents a infectious process. No bony abnormality is noted.  IMPRESSION: Somewhat rounded area of increased density identified in the left mid lung. There is some suggestion of lucency which may represent some cavitation. This likely represents an acute pneumonia. Followup is recommended in 3-4 weeks following appropriate therapy to assess for resolution. CT of the chest is recommended if the area persists.   Electronically Signed   By: Inez Catalina M.D.   On: 11/02/2014 09:23    EKG:  Normal sinus rhythm with incomplete right bundle branch block and LVH  40-YEAR-OLD FEMALE WITH HISTORY OF COPD WHO WEARS OXYGEN AT NIGHT, CROHN'S DISEASE AND HYPERTENSION WHO PRESENTS WITH with fever and hypoxic respiratory failure.  1. Sepsis: Patient presents with fever and tachycardia. Sepsis is secondary to community-acquired pneumonia. Blood cultures were taken in the emergency room which we will need to follow-up on. Patient was started on IV antibiotics. I will start IV Levaquin for community acquired pneumonia along with IV fluids.    2. Acute respiratory failure: This is secondary to community acquired pneumonia. Patient does wear oxygen when necessary at night. We will wean oxygen as tolerated.   3. Community acquired pneumonia: As mentioned above blood cultures are taken in the emergency room. Patient will be started on Levaquin.   4. COPD: Patient does not appear to be in exacerbation at this time. Continue inhalers. She is on chronic steroids, prednisone 5 mg daily which I will continue.  5. Essential hypertension: Patient will continue Lotrel, HCTZ and metoprolol.  6. Hypokalemia: Potassium will be repleted and rechecked in a.m. Magnesium level also be checked.  7. Crohn's disease: Continue leflunomide  and Pentasa.  8. History of CAD: Continue aspirin, Plavix, statin, metoprolol    are reviewed and case discussed with ED provider. Management plans discussed with the patient and she is in agreement.  CODE STATUS: FULL  TOTAL TIME TAKING CARE OF THIS PATIENT: 45 minutes.    Roshan Salamon M.D on 11/02/2014 at 10:22 AM  Between 7am to 6pm - Pager - [phone] After 6pm go to [email or web address] - password [email or web address] [facility]  Office  [phone]  CC: Primary care physician; [name], MD

## 2014-11-02 NOTE — Progress Notes (Signed)
Skin verified by Jamelle Rushing., RN

## 2014-11-03 LAB — BASIC METABOLIC PANEL
Anion gap: 10 (ref 5–15)
BUN: 17 mg/dL (ref 6–20)
CO2: 26 mmol/L (ref 22–32)
Calcium: 7.8 mg/dL — ABNORMAL LOW (ref 8.9–10.3)
Chloride: 100 mmol/L — ABNORMAL LOW (ref 101–111)
Creatinine, Ser: 1.08 mg/dL — ABNORMAL HIGH (ref 0.44–1.00)
GFR calc Af Amer: 55 mL/min — ABNORMAL LOW (ref >60)
GFR calc non Af Amer: 48 mL/min — ABNORMAL LOW (ref >60)
Glucose, Bld: 107 mg/dL — ABNORMAL HIGH (ref 65–99)
Potassium: 2.9 mmol/L — CL (ref 3.5–5.1)
Sodium: 136 mmol/L (ref 135–145)

## 2014-11-03 LAB — CBC
HCT: 34 % — ABNORMAL LOW (ref 35.0–47.0)
Hemoglobin: 11.2 g/dL — ABNORMAL LOW (ref 12.0–16.0)
MCH: 28.9 pg (ref 26.0–34.0)
MCHC: 32.9 g/dL (ref 32.0–36.0)
MCV: 87.7 fL (ref 80.0–100.0)
Platelets: 155 10*3/uL (ref 150–440)
RBC: 3.88 MIL/uL (ref 3.80–5.20)
RDW: 14.4 % (ref 11.5–14.5)
WBC: 13.7 10*3/uL — ABNORMAL HIGH (ref 3.6–11.0)

## 2014-11-03 LAB — MAGNESIUM: Magnesium: 1.3 mg/dL — ABNORMAL LOW (ref 1.7–2.4)

## 2014-11-03 MED ORDER — MAGNESIUM SULFATE 4 GM/100ML IV SOLN
4.0000 g | Freq: Once | INTRAVENOUS | Status: AC
  Administered 2014-11-03: 4 g via INTRAVENOUS
  Filled 2014-11-03 (×2): qty 100

## 2014-11-03 MED ORDER — POTASSIUM CHLORIDE CRYS ER 20 MEQ PO TBCR
20.0000 meq | EXTENDED_RELEASE_TABLET | Freq: Two times a day (BID) | ORAL | Status: DC
  Administered 2014-11-04 – 2014-11-05 (×2): 20 meq via ORAL
  Filled 2014-11-03 (×2): qty 1

## 2014-11-03 MED ORDER — POTASSIUM CHLORIDE IN NACL 40-0.9 MEQ/L-% IV SOLN
INTRAVENOUS | Status: DC
  Administered 2014-11-03: 100 mL/h via INTRAVENOUS
  Filled 2014-11-03 (×6): qty 1000

## 2014-11-03 NOTE — Progress Notes (Signed)
[place] at [place] NAME: Raven [name]    MR#:  [id number]  DATE OF BIRTH:  05/22/1934  SUBJECTIVE:  Came in with increasing sob, cough, poor appetite  REVIEW OF SYSTEMS:   Review of Systems  Constitutional: Positive for malaise/fatigue. Negative for fever, chills and weight loss.  HENT: Negative for ear discharge, ear pain and nosebleeds.   Eyes: Negative for blurred vision, pain and discharge.  Respiratory: Positive for cough and shortness of breath. Negative for sputum production, wheezing and stridor.   Cardiovascular: Negative for chest pain, palpitations, orthopnea and PND.  Gastrointestinal: Negative for nausea, vomiting, abdominal pain and diarrhea.  Genitourinary: Negative for urgency and frequency.  Musculoskeletal: Negative for back pain and joint pain.  Neurological: Positive for weakness. Negative for sensory change, speech change and focal weakness.  Psychiatric/Behavioral: Negative for depression. The patient is not nervous/anxious.    Tolerating Diet:very little Tolerating Raven:   DRUG ALLERGIES:   Allergies  Allergen Reactions  . Penicillins Other (See Comments)  . Augmentin [Amoxicillin-Pot Clavulanate] Other (See Comments)    Gi upset  . Indocin [Indomethacin] Other (See Comments)    unknown  . Lodine [Etodolac] Other (See Comments)    Gi upset  . Methotrexate Derivatives Other (See Comments)    Gi upset  . Gold Rash  . Zantac [Ranitidine Hcl] Rash    VITALS:  Blood pressure 130/49, pulse 86, temperature 98.2 F (36.8 C), temperature source Oral, resp. rate 16, height 5' 7"  (1.702 m), weight 84.188 kg (185 lb 9.6 oz), SpO2 94 %.  PHYSICAL EXAMINATION:   Physical Exam  GENERAL:  80 y.o.-year-old patient lying in the bed with no acute distress.  EYES: Pupils equal, round, reactive to light and accommodation. No scleral icterus. Extraocular muscles intact.  HEENT: Head atraumatic, normocephalic.  Oropharynx and nasopharynx clear.  NECK:  Supple, no jugular venous distention. No thyroid enlargement, no tenderness.  LUNGS:decreased breath sounds bilaterally, no wheezing, rales, rhonchi. No use of accessory muscles of respiration.  CARDIOVASCULAR: S1, S2 normal. No murmurs, rubs, or gallops.  ABDOMEN: Soft, nontender, nondistended. Bowel sounds present. No organomegaly or mass.  EXTREMITIES: No cyanosis, clubbing or edema b/l.    NEUROLOGIC: Cranial nerves II through XII are intact. No focal Motor or sensory deficits b/l.   PSYCHIATRIC: The patient is alert and oriented x 3.  SKIN: No obvious rash, lesion, or ulcer.    LABORATORY PANEL:   CBC  Recent Labs Lab 11/03/14 0501  WBC 13.7*  HGB 11.2*  HCT 34.0*  PLT 155    Chemistries   Recent Labs Lab 11/02/14 0900  11/03/14 0501  NA 139  --  136  K 3.0*  --  2.9*  CL 99*  --  100*  CO2 29  --  26  GLUCOSE 121*  --  107*  BUN 19  --  17  CREATININE 0.92  --  1.08*  CALCIUM 9.0  --  7.8*  MG  --   < > 1.3*  AST 28  --   --   ALT 21  --   --   ALKPHOS 70  --   --   BILITOT 1.0  --   --   < > = values in this interval not displayed.  Cardiac Enzymes  Recent Labs Lab 11/02/14 0900  TROPONINI 0.05*    RADIOLOGY:  Dg Chest 1 View  11/02/2014   CLINICAL DATA:  Fevers  EXAM: CHEST  1 VIEW  COMPARISON:  06/08/2014  FINDINGS: Cardiac shadow is within normal limits. Elevation the right hemidiaphragm is again seen. In the left perihilar region there is a somewhat rounded area of increased density identified. There is some central lucency although this may be projectional in nature. The possibility of a cavitary lesion cannot be totally excluded. Given the normal appearing chest x-ray, 4 months ago this likely represents a infectious process. No bony abnormality is noted.  IMPRESSION: Somewhat rounded area of increased density identified in the left mid lung. There is some suggestion of lucency which may represent some  cavitation. This likely represents an acute pneumonia. Followup is recommended in 3-4 weeks following appropriate therapy to assess for resolution. CT of the chest is recommended if the area persists.   Electronically Signed   By: Inez Catalina M.D.   On: 11/02/2014 09:23     ASSESSMENT AND PLAN:   68-YEAR-OLD FEMALE WITH HISTORY OF COPD WHO WEARS OXYGEN AT NIGHT, CROHN'S DISEASE AND HYPERTENSION WHO PRESENTS WITH with fever and hypoxic respiratory failure.  1. Sepsis: Patient presents with fever and tachycardia. Sepsis is secondary to community-acquired pneumonia.  -f/u Blood cultures were taken in the emergency room  -IV Levaquin for community acquired pneumonia along with IV fluids.   2. Acute respiratory failure: This is secondary to community acquired pneumonia. Patient does wear oxygen when necessary at night. We will wean oxygen as tolerated.  3. Community acquired pneumonia: As mentioned above blood cultures are taken in the emergency room.  - on Levaquin.  4. COPD: Patient does not appear to be in exacerbation at this time. Continue inhalers. She is on chronic steroids, prednisone 5 mg daily which  will continue.  5. Essential hypertension: Patient will continue Lotrel, HCTZ and metoprolol.  6. Hypokalemia: Potassium will be repleted and rechecked in a.m. Magnesium level also be checked.  7. Crohn's disease: Continue leflunomide and Pentasa.  8. History of CAD: Continue aspirin, Plavix, statin, metoprolol  Case discussed with Care Management/Social Worker. Management plans discussed with the patient, family and they are in [name].  CODE STATUS: full  DVT Prophylaxis: lovenox  TOTAL TIME TAKING CARE OF THIS PATIENT: 30 minutes.  >50% time spent on counselling and coordination of care Raven and family     Garan Frappier M.D on 11/03/2014 at 4:37 PM  Between 7am to 6pm - Pager - [phone]  After 6pm go to [email or web address] - password [email or web address]  Office  [phone]  CC: Primary care physician; [name], MD

## 2014-11-04 ENCOUNTER — Inpatient Hospital Stay
Admit: 2014-11-04 | Discharge: 2014-11-04 | Disposition: A | Discharge status: Home / Self Care | Payer: Medicare Other | Attending: Internal Medicine | Admitting: Internal Medicine

## 2014-11-04 ENCOUNTER — Inpatient Hospital Stay: Payer: Medicare Other

## 2014-11-04 LAB — TROPONIN I
Troponin I: 0.16 ng/mL — ABNORMAL HIGH (ref <0.031)
Troponin I: 0.28 ng/mL — ABNORMAL HIGH (ref <0.031)

## 2014-11-04 LAB — BASIC METABOLIC PANEL
Anion gap: 6 (ref 5–15)
BUN: 11 mg/dL (ref 6–20)
CO2: 27 mmol/L (ref 22–32)
Calcium: 7.9 mg/dL — ABNORMAL LOW (ref 8.9–10.3)
Chloride: 107 mmol/L (ref 101–111)
Creatinine, Ser: 0.81 mg/dL (ref 0.44–1.00)
GFR calc Af Amer: >60 mL/min (ref >60)
GFR calc non Af Amer: >60 mL/min (ref >60)
Glucose, Bld: 106 mg/dL — ABNORMAL HIGH (ref 65–99)
Potassium: 4 mmol/L (ref 3.5–5.1)
Sodium: 140 mmol/L (ref 135–145)

## 2014-11-04 LAB — CBC
HCT: 31.3 % — ABNORMAL LOW (ref 35.0–47.0)
Hemoglobin: 10.3 g/dL — ABNORMAL LOW (ref 12.0–16.0)
MCH: 29 pg (ref 26.0–34.0)
MCHC: 32.8 g/dL (ref 32.0–36.0)
MCV: 88.4 fL (ref 80.0–100.0)
Platelets: 158 10*3/uL (ref 150–440)
RBC: 3.54 MIL/uL — ABNORMAL LOW (ref 3.80–5.20)
RDW: 14.4 % (ref 11.5–14.5)
WBC: 8.5 10*3/uL (ref 3.6–11.0)

## 2014-11-04 LAB — MAGNESIUM: Magnesium: 2.2 mg/dL (ref 1.7–2.4)

## 2014-11-04 LAB — URINE CULTURE: Special Requests: NORMAL

## 2014-11-04 MED ORDER — IPRATROPIUM-ALBUTEROL 0.5-2.5 (3) MG/3ML IN SOLN
3.0000 mL | RESPIRATORY_TRACT | Status: DC
  Administered 2014-11-04 – 2014-11-05 (×8): 3 mL via RESPIRATORY_TRACT
  Filled 2014-11-04 (×8): qty 3

## 2014-11-04 MED ORDER — PREDNISONE 50 MG PO TABS
50.0000 mg | ORAL_TABLET | Freq: Every day | ORAL | Status: DC
  Administered 2014-11-05 – 2014-11-08 (×4): 50 mg via ORAL
  Filled 2014-11-04 (×4): qty 1

## 2014-11-04 NOTE — Progress Notes (Signed)
*  PRELIMINARY RESULTS* Echocardiogram 2D Echocardiogram has been performed.  Raven [name] 11/04/2014, 4:46 PM

## 2014-11-04 NOTE — Progress Notes (Signed)
2 view CXR dc'd , pt unable to tolerate transport.  Portable CXR ordered.  Dr. Volanda Napoleon notified, acknowledged and also gave  order to stop IVF's.  Pt saline locked.

## 2014-11-04 NOTE — Progress Notes (Signed)
[place] [facility] [place] NAME: Raven [name]    MR#:  [id number]  DATE OF BIRTH:  1935-01-16  SUBJECTIVE:  Continues to feel poorly. States that she has no energy. Is short of breath still requiring high amount of supplemental oxygen. Does not use oxygen at home.  REVIEW OF SYSTEMS:   Review of Systems  Constitutional: Positive for malaise/fatigue. Negative for fever, chills and weight loss.  HENT: Negative for ear discharge, ear pain and nosebleeds.   Eyes: Negative for blurred vision, pain and discharge.  Respiratory: Positive for cough and shortness of breath. Negative for sputum production, wheezing and stridor.   Cardiovascular: Negative for chest pain, palpitations, orthopnea and PND.  Gastrointestinal: Negative for nausea, vomiting, abdominal pain and diarrhea.  Genitourinary: Negative for urgency and frequency.  Musculoskeletal: Negative for back pain and joint pain.  Neurological: Positive for weakness. Negative for sensory change, speech change and focal weakness.  Psychiatric/Behavioral: Negative for depression. The patient is not nervous/anxious.    Tolerating Diet:very little Tolerating PT:   DRUG ALLERGIES:   Allergies  Allergen Reactions  . Penicillins Other (See Comments)  . Augmentin [Amoxicillin-Pot Clavulanate] Other (See Comments)    Gi upset  . Indocin [Indomethacin] Other (See Comments)    unknown  . Lodine [Etodolac] Other (See Comments)    Gi upset  . Methotrexate Derivatives Other (See Comments)    Gi upset  . Gold Rash  . Zantac [Ranitidine Hcl] Rash    VITALS:  Blood pressure 155/74, pulse 89, temperature 98.1 F (36.7 C), temperature source Oral, resp. rate 26, height 5' 7"  (1.702 m), weight 84.188 kg (185 lb 9.6 oz), SpO2 95 %.  PHYSICAL EXAMINATION:   Physical Exam  GENERAL:  80 y.o.-year-old patient lying in the bed with no acute distress.  EYES: Pupils equal, round, reactive to light and  accommodation. No scleral icterus. Extraocular muscles intact.  HEENT: Head atraumatic, normocephalic. Oropharynx and nasopharynx clear.  NECK:  Supple, no jugular venous distention. No thyroid enlargement, no tenderness.  LUNGS:decreased breath sounds bilaterally, no wheezing, rales, rhonchi. No use of accessory muscles of respiration.  CARDIOVASCULAR: S1, S2 normal. No murmurs, rubs, or gallops.  ABDOMEN: Soft, nontender, nondistended. Bowel sounds present. No organomegaly or mass.  EXTREMITIES: No cyanosis, clubbing or edema b/l.    NEUROLOGIC: Cranial nerves II through XII are intact. No focal Motor or sensory deficits b/l.   PSYCHIATRIC: The patient is alert and oriented x 3.  SKIN: No obvious rash, lesion, or ulcer.    LABORATORY PANEL:   CBC  Recent Labs Lab 11/04/14 0433  WBC 8.5  HGB 10.3*  HCT 31.3*  PLT 158    Chemistries   Recent Labs Lab 11/02/14 0900  11/04/14 0433  NA 139  < > 140  K 3.0*  < > 4.0  CL 99*  < > 107  CO2 29  < > 27  GLUCOSE 121*  < > 106*  BUN 19  < > 11  CREATININE 0.92  < > 0.81  CALCIUM 9.0  < > 7.9*  MG  --   < > 2.2  AST 28  --   --   ALT 21  --   --   ALKPHOS 70  --   --   BILITOT 1.0  --   --   < > = values in this interval not displayed.  Cardiac Enzymes  Recent Labs Lab 11/02/14 0900  TROPONINI 0.05*  RADIOLOGY:  No results found.   ASSESSMENT AND PLAN:   71-YEAR-OLD FEMALE WITH HISTORY OF COPD WHO WEARS OXYGEN AT NIGHT, CROHN'S DISEASE AND HYPERTENSION WHO PRESENTS WITH with fever and hypoxic respiratory failure.  1. Sepsis: Resolved. Initially met criteria with fever tachycardia and leukocytosis   - Blood cultures negative so far, urine culture pending - IV Levaquin for community acquired pneumonia  2. Acute respiratory failure:  - Due to pneumonia, community-acquired and COPD - Wears oxygen at night, now on fairly high levels of supplemental oxygenation at all times - We'll repeat chest x-ray today due  to lack of improvement - discontinue IV fluids  3. Community acquired pneumonia: - Cultures negative, continue Levaquin - Leukocytosis improved, no further fevers  4. COPD:  - start nebs and prednisone (on 5 mg daily at home, increase to 50 on 8/28)  5. Essential hypertension: Patient will continue Lotrel, HCTZ and metoprolol.  6. Hypokalemia: Resolved   7. Crohn's disease: Continue leflunomide and Pentasa.  8. History of CAD:  - continue aspirin, Plavix, statin, metoprolol - will cycle enzymes and get ECHO as I do not see a recent study  Case discussed with Care Management/Social Worker. Management plans discussed with the patient, no family present.  CODE STATUS: full  DVT Prophylaxis: lovenox  TOTAL TIME TAKING CARE OF THIS PATIENT: 30 minutes.  >50% time spent on counselling and coordination of care pt and family   Myrtis Ser M.D on 11/04/2014 at 2:08 PM  Between 7am to 6pm - Pager - [phone]  After 6pm go to [email or web address] - password [email or web address] [facility]  Office  [phone]  CC: Primary care physician; [name], MD

## 2014-11-05 LAB — MAGNESIUM: Magnesium: 1.9 mg/dL (ref 1.7–2.4)

## 2014-11-05 LAB — BASIC METABOLIC PANEL
Anion gap: 7 (ref 5–15)
BUN: 9 mg/dL (ref 6–20)
CO2: 23 mmol/L (ref 22–32)
Calcium: 7.8 mg/dL — ABNORMAL LOW (ref 8.9–10.3)
Chloride: 108 mmol/L (ref 101–111)
Creatinine, Ser: 0.72 mg/dL (ref 0.44–1.00)
GFR calc Af Amer: >60 mL/min (ref >60)
GFR calc non Af Amer: >60 mL/min (ref >60)
Glucose, Bld: 118 mg/dL — ABNORMAL HIGH (ref 65–99)
Potassium: 4.1 mmol/L (ref 3.5–5.1)
Sodium: 138 mmol/L (ref 135–145)

## 2014-11-05 LAB — CBC
HCT: 30.7 % — ABNORMAL LOW (ref 35.0–47.0)
Hemoglobin: 10 g/dL — ABNORMAL LOW (ref 12.0–16.0)
MCH: 28.8 pg (ref 26.0–34.0)
MCHC: 32.4 g/dL (ref 32.0–36.0)
MCV: 88.9 fL (ref 80.0–100.0)
Platelets: 171 10*3/uL (ref 150–440)
RBC: 3.46 MIL/uL — ABNORMAL LOW (ref 3.80–5.20)
RDW: 14.2 % (ref 11.5–14.5)
WBC: 8.7 10*3/uL (ref 3.6–11.0)

## 2014-11-05 LAB — TROPONIN I
Troponin I: 0.15 ng/mL — ABNORMAL HIGH (ref <0.031)
Troponin I: 0.19 ng/mL — ABNORMAL HIGH (ref <0.031)
Troponin I: 0.14 ng/mL — ABNORMAL HIGH (ref <0.031)
Troponin I: 0.11 ng/mL — ABNORMAL HIGH (ref <0.031)

## 2014-11-05 MED ORDER — IPRATROPIUM-ALBUTEROL 0.5-2.5 (3) MG/3ML IN SOLN
3.0000 mL | Freq: Four times a day (QID) | RESPIRATORY_TRACT | Status: DC
  Administered 2014-11-06 – 2014-11-08 (×10): 3 mL via RESPIRATORY_TRACT
  Filled 2014-11-05 (×10): qty 3

## 2014-11-05 MED ORDER — MENTHOL 3 MG MT LOZG
1.0000 | LOZENGE | OROMUCOSAL | Status: DC | PRN
  Administered 2014-11-07: 3 mg via ORAL
  Filled 2014-11-05 (×2): qty 9

## 2014-11-05 MED ORDER — FUROSEMIDE 10 MG/ML IJ SOLN
20.0000 mg | Freq: Once | INTRAMUSCULAR | Status: AC
  Administered 2014-11-05: 20 mg via INTRAVENOUS
  Filled 2014-11-05: qty 2

## 2014-11-05 MED ORDER — POTASSIUM CHLORIDE CRYS ER 10 MEQ PO TBCR
10.0000 meq | EXTENDED_RELEASE_TABLET | Freq: Two times a day (BID) | ORAL | Status: DC
  Administered 2014-11-05 – 2014-11-08 (×6): 10 meq via ORAL
  Filled 2014-11-05 (×6): qty 1

## 2014-11-05 NOTE — Care Management (Signed)
Discussed during progression of need to assess activity status and need for continuous 02.  It is reported patient is only on 02 at night.  Patient sleeping soundly when care manager attempted to inquire about this.  There is a documented sat of 89% and 86% on 4 liters within the last 24 hours.

## 2014-11-05 NOTE — Consult Note (Signed)
[facility] Consultation Note  Patient ID: Raven [name], MRN: [id number], DOB/AGE: [birth year] 80 y.o. Admit date: 11/02/2014   Date of Consult: 11/05/2014 Primary Physician: [name], MD Primary Cardiologist: Bluford Kaufmann  Chief Complaint:  Chief Complaint  Patient presents with  . Fever   Reason for Consult: elevated troponin with weakness and fatigue and shortness of breath  HPI: 80 y.o. female with known coronary artery disease status post PCI and stent placement last year and old myocardial infarction with essential hypertension mixed hyperlipidemia on appropriate medication management. Patient has had significant increase in weakness and fatigue shortness of breath but no evidence of significant chest discomfort. When seen in the emergency room she had continued weakness and fatigue and crackles. With this the x-ray showed a possible density in the left lower lobe with some other inflammatory changes. White blood cell count was normal. There was some elevated of troponin 0.28 somewhat suspicious for minimal subendocardial myocardial infarction although could be secondary to acute illness as well as anemia which she has. With no evidence of significant new symptoms and an EKG showing normal sinus rhythm would continue to follow closely for any further cardiovascular symptoms. The patient has had somewhat improvement throughout the last night Past Medical History  Diagnosis Date  . Arthritis   . Asthma   . COPD (chronic obstructive pulmonary disease)   . Crohn's disease   . Pneumonia   . Hypertension   . Hyperlipidemia   . Myocardial infarction   . Peptic ulcer disease   . Osteopenia   . Depression   . Anemia       Surgical History:  Past Surgical History  Procedure Laterality Date  . Abdominal surgery    . Small intestine surgery    . Shoulder surgery Right     x 2   . Cardiac catheterization    . Colonoscopy with propofol N/A 09/27/2014   Procedure: COLONOSCOPY WITH PROPOFOL;  Surgeon: [name], MD;  Location: [facility] ENDOSCOPY;  Service: Gastroenterology;  Laterality: N/A;     Home Meds: Prior to Admission medications   Medication Sig Start Date End Date Taking? Authorizing Provider  acetaminophen (TYLENOL) 500 MG tablet Take 500 mg by mouth every 6 (six) hours as needed.   Yes Historical Provider, MD  ACTONEL 150 MG tablet Take 150 mg by mouth every 30 (thirty) days.  05/12/13  Yes Historical Provider, MD  albuterol (PROVENTIL HFA;VENTOLIN HFA) 108 (90 BASE) MCG/ACT inhaler Inhale 2 puffs into the lungs every 6 (six) hours as needed for wheezing or shortness of breath.   Yes Historical Provider, MD  amitriptyline (ELAVIL) 10 MG tablet Take 20 mg by mouth at bedtime.  03/17/13  Yes Historical Provider, MD  amLODipine-benazepril (LOTREL) 5-20 MG per capsule Take 1 capsule by mouth daily.  03/30/13  Yes Historical Provider, MD  aspirin EC 81 MG tablet Take 81 mg by mouth daily.   Yes Historical Provider, MD  atorvastatin (LIPITOR) 80 MG tablet Take 80 mg by mouth daily.   Yes Historical Provider, MD  Calcium Carb-Cholecalciferol (CALCIUM 500+D PO) Take 1 tablet by mouth daily.   Yes Historical Provider, MD  Cholecalciferol (VITAMIN D-3) 1000 UNITS CAPS Take 1 capsule by mouth daily.   Yes Historical Provider, MD  clopidogrel (PLAVIX) 75 MG tablet Take 75 mg by mouth daily.   Yes Historical Provider, MD  Cobalamine Combinations (VITAMIN B12-FOLIC ACID) 643-329 MCG TABS Take 1 tablet by mouth daily.  Yes Historical Provider, MD  Fluticasone-Salmeterol (ADVAIR HFA IN) Inhale into the lungs 2 (two) times daily.   Yes Historical Provider, MD  HUMIRA 40 MG/0.8ML injection Inject 40 mg into the skin every 14 (fourteen) days.  05/05/13  Yes Historical Provider, MD  hydrochlorothiazide (HYDRODIURIL) 25 MG tablet Take 25 mg by mouth daily.  05/03/13  Yes Historical Provider, MD  leflunomide (ARAVA) 10 MG tablet Take 10 mg by mouth daily.  04/12/13   Yes Historical Provider, MD  metoprolol succinate (TOPROL-XL) 25 MG 24 hr tablet Take 25 mg by mouth daily.   Yes Historical Provider, MD  Multiple Vitamin (MULTIVITAMIN) capsule Take 1 capsule by mouth daily.   Yes Historical Provider, MD  NEXIUM 40 MG capsule Take 40 mg by mouth daily at 12 noon.  04/24/13  Yes Historical Provider, MD  nitroGLYCERIN (NITROSTAT) 0.4 MG SL tablet Place 0.4 mg under the tongue every 5 (five) minutes as needed for chest pain.   Yes Historical Provider, MD  ofloxacin (OCUFLOX) 0.3 % ophthalmic solution Place 1 drop into both eyes daily as needed.   Yes Historical Provider, MD  PENTASA 500 MG CR capsule Take 2,000 mg by mouth 2 (two) times daily.  04/14/13  Yes Historical Provider, MD  potassium chloride (K-DUR,KLOR-CON) 10 MEQ tablet Take 10 mEq by mouth 2 (two) times daily.   Yes Historical Provider, MD  predniSONE (DELTASONE) 5 MG tablet Take 5 mg by mouth daily with breakfast.   Yes Historical Provider, MD  senna (SENOKOT) 8.6 MG tablet Take 1 tablet by mouth daily.   Yes Historical Provider, MD    Inpatient Medications:  . amitriptyline  20 mg Oral QHS  . amLODipine  5 mg Oral Daily   And  . benazepril  20 mg Oral Daily  . antiseptic oral rinse  7 mL Mouth Rinse BID  . aspirin EC  81 mg Oral Daily  . atorvastatin  80 mg Oral Daily  . cholecalciferol  1,000 Units Oral Daily  . clopidogrel  75 mg Oral Daily  . vitamin B-12  500 mcg Oral Daily   And  . folic acid  0.5 mg Oral Daily  . enoxaparin (LOVENOX) injection  40 mg Subcutaneous Q24H  . hydrochlorothiazide  25 mg Oral Daily  . ipratropium-albuterol  3 mL Nebulization Q4H  . leflunomide  10 mg Oral Daily  . levofloxacin (LEVAQUIN) IV  750 mg Intravenous Q24H  . mesalamine  2,000 mg Oral BID  . metoprolol succinate  25 mg Oral Daily  . mometasone-formoterol  2 puff Inhalation BID  . multivitamin with minerals  1 tablet Oral Daily  . potassium chloride  20 mEq Oral BID  . predniSONE  50 mg Oral Q  breakfast  . senna  1 tablet Oral Daily  . sodium chloride  3 mL Intravenous Q12H      Allergies:  Allergies  Allergen Reactions  . Penicillins Other (See Comments)  . Augmentin [Amoxicillin-Pot Clavulanate] Other (See Comments)    Gi upset  . Indocin [Indomethacin] Other (See Comments)    unknown  . Lodine [Etodolac] Other (See Comments)    Gi upset  . Methotrexate Derivatives Other (See Comments)    Gi upset  . Gold Rash  . Zantac [Ranitidine Hcl] Rash    Social History   Social History  . Marital Status: Widowed    Spouse Name: N/A  . Number of Children: N/A  . Years of Education: N/A   Occupational History  .  Not on file.   Social History Main Topics  . Smoking status: Former Research scientist (life sciences)  . Smokeless tobacco: Not on file  . Alcohol Use: No  . Drug Use: No  . Sexual Activity: Not on file   Other Topics Concern  . Not on file   Social History Narrative     History reviewed. No pertinent family history.   Review of Systems Positive for weakness fatigue and shortness of breath Negative for: General:  chills, fever, night sweats or weight changes.  Cardiovascular: PND orthopnea syncope dizziness  Dermatological skin lesions rashes Respiratory: Cough congestion Urologic: Frequent urination urination at night and hematuria Abdominal: negative for nausea, vomiting, diarrhea, bright red blood per rectum, melena, or hematemesis Neurologic: negative for visual changes, and/or hearing changes  All other systems reviewed and are otherwise negative except as noted above.  Labs:  Recent Labs  11/02/14 0900 11/04/14 1524 11/04/14 2129 11/05/14 0227  TROPONINI 0.05* 0.16* 0.28* 0.15*   Lab Results  Component Value Date   WBC 8.7 11/05/2014   HGB 10.0* 11/05/2014   HCT 30.7* 11/05/2014   MCV 88.9 11/05/2014   PLT 171 11/05/2014    Recent Labs Lab 11/02/14 0900  11/05/14 0227  NA 139  < > 138  K 3.0*  < > 4.1  CL 99*  < > 108  CO2 29  < > 23  BUN 19   < > 9  CREATININE 0.92  < > 0.72  CALCIUM 9.0  < > 7.8*  PROT 7.0  --   --   BILITOT 1.0  --   --   ALKPHOS 70  --   --   ALT 21  --   --   AST 28  --   --   GLUCOSE 121*  < > 118*  < > = values in this interval not displayed. Lab Results  Component Value Date   CHOL 129 06/09/2014   HDL 52 06/09/2014   LDLCALC 60 06/09/2014   TRIG 87 06/09/2014   No results found for: DDIMER  Radiology/Studies:  Dg Chest 1 View  11/02/2014   CLINICAL DATA:  Fevers  EXAM: CHEST  1 VIEW  COMPARISON:  06/08/2014  FINDINGS: Cardiac shadow is within normal limits. Elevation the right hemidiaphragm is again seen. In the left perihilar region there is a somewhat rounded area of increased density identified. There is some central lucency although this may be projectional in nature. The possibility of a cavitary lesion cannot be totally excluded. Given the normal appearing chest x-ray, 4 months ago this likely represents a infectious process. No bony abnormality is noted.  IMPRESSION: Somewhat rounded area of increased density identified in the left mid lung. There is some suggestion of lucency which may represent some cavitation. This likely represents an acute pneumonia. Followup is recommended in 3-4 weeks following appropriate therapy to assess for resolution. CT of the chest is recommended if the area persists.   Electronically Signed   By: Inez Catalina M.D.   On: 11/02/2014 09:23   Dg Chest Port 1 View  11/04/2014   CLINICAL DATA:  Pneumonia, worsening shortness of breath this morning  EXAM: PORTABLE CHEST - 1 VIEW  COMPARISON:  Portable exam 1416 hr compared to 11/02/2014.  FINDINGS: Mild enlargement of cardiac silhouette.  Increased infiltrates in the mid to lower lungs bilaterally.  Chronic elevation of RIGHT diaphragm.  Probable small pleural effusions bilaterally.  More focal opacity in the LEFT perihilar region remains, may represent  asymmetric infiltrate but underlying mass not excluded.  No  pneumothorax.  IMPRESSION: Increased pulmonary infiltrates which could represent edema or infection.  Probable small bibasilar pleural effusions.  Persistent asymmetric density in the LEFT perihilar region may represent asymmetric infiltrate but radiographic followup until resolution required to exclude underlying mass.   Electronically Signed   By: Lavonia Dana M.D.   On: 11/04/2014 14:40    EKG: Normal sinus rhythm without evidence of acute myocardial infarction  Weights: Filed Weights   11/02/14 0840 11/02/14 1134  Weight: 185 lb (83.915 kg) 185 lb 9.6 oz (84.188 kg)     Physical Exam: Blood pressure 149/60, pulse 96, temperature 98 F (36.7 C), temperature source Oral, resp. rate 22, height 5' 7"  (1.702 m), weight 185 lb 9.6 oz (84.188 kg), SpO2 96 %. Body mass index is 29.06 kg/(m^2). General: Well developed, well nourished, in no acute distress. Head eyes ears nose throat: Normocephalic, atraumatic, sclera non-icteric, no xanthomas, nares are without discharge. No apparent thyromegaly and/or mass  Lungs: Normal respiratory effort.  no wheezes, few rales, basilar rhonchi.  Heart: RRR with normal S1 S2. no murmur gallop, no rub, PMI is normal size and placement, carotid upstroke normal without bruit, jugular venous pressure is normal Abdomen: Soft, non-tender, non-distended with normoactive bowel sounds. No hepatomegaly. No rebound/guarding. No obvious abdominal masses. Abdominal aorta is normal size without bruit Extremities: No edema. no cyanosis, no clubbing, no ulcers  Peripheral : 2+ bilateral upper extremity pulses, 2+ bilateral femoral pulses, 2+ bilateral dorsal pedal pulse Neuro: Alert and oriented. No facial asymmetry. No focal deficit. Moves all extremities spontaneously. Musculoskeletal: Normal muscle tone without kyphosis Psych:  Responds to questions appropriately with a normal affect.    Assessment: 80 year old female with known coronary artery disease essential  hypertension makes hyperlipidemia with weakness and fatigue and pulmonary changes consistent with possible pneumonia and elevated troponin possibly consistent with minimal myocardial infarction and/or non-ST elevation myocardial infarction.  Plan: 1. Continue serial ECG and enzymes to assess for possible myocardial infarction 2. Continue medication management for further risk reduction including Plavix and aspirin 3. High intensity cholesterol therapy with atorvastatin 4. Beta blocker for screw reduction of previous history of myocardial infarction and treatment of concerns for possible non-ST elevation myocardial infarction 5. Further cardiac diagnostics as necessary for evaluation of above after treatment of possible pneumonia  Signed, Corey Skains M.D. [facility] 11/05/2014, 7:46 AM

## 2014-11-05 NOTE — Care Management Important Message (Signed)
Important Message  Patient Details  Name: Raven [name] MRN: [id number] Date of Birth: 1934/08/11   Medicare Important Message Given:  Yes-second notification given    Darius Bump Allmond 11/05/2014, 10:49 AM

## 2014-11-05 NOTE — Progress Notes (Signed)
[place] at [place] NAME: Raven [name]    MR#:  [id number]  DATE OF BIRTH:  Sep 04, 1934  SUBJECTIVE:   Feeling much better today. Sitting up in the chair. Family is present. Breathing much more easily. Still requiring 3-4 L via nasal cannula. Decreased appetite but has eaten some breakfast.  REVIEW OF SYSTEMS:   Review of Systems  Constitutional: Positive for malaise/fatigue. Negative for fever, chills and weight loss.  HENT: Negative for ear discharge, ear pain and nosebleeds.   Eyes: Negative for blurred vision, pain and discharge.  Respiratory: Positive for cough and shortness of breath. Negative for sputum production, wheezing and stridor.   Cardiovascular: Negative for chest pain, palpitations, orthopnea and PND.  Gastrointestinal: Negative for nausea, vomiting, abdominal pain and diarrhea.  Genitourinary: Negative for urgency and frequency.  Musculoskeletal: Negative for back pain and joint pain.  Neurological: Positive for weakness. Negative for sensory change, speech change and focal weakness.  Psychiatric/Behavioral: Negative for depression. The patient is not nervous/anxious.    Tolerating Diet:very little Tolerating PT:   DRUG ALLERGIES:   Allergies  Allergen Reactions  . Penicillins Other (See Comments)  . Augmentin [Amoxicillin-Pot Clavulanate] Other (See Comments)    Gi upset  . Indocin [Indomethacin] Other (See Comments)    unknown  . Lodine [Etodolac] Other (See Comments)    Gi upset  . Methotrexate Derivatives Other (See Comments)    Gi upset  . Gold Rash  . Zantac [Ranitidine Hcl] Rash    VITALS:  Blood pressure 138/54, pulse 97, temperature 98.7 F (37.1 C), temperature source Oral, resp. rate 18, height _0  (1.702 m), weight 84.188 kg (185 lb 9.6 oz), SpO2 90 %.  PHYSICAL EXAMINATION:   Physical Exam  GENERAL:  80 y.o.-year-old patient lying in the bed with no acute distress.  EYES: Pupils  equal, round, reactive to light and accommodation. No scleral icterus. Extraocular muscles intact.  HEENT: Head atraumatic, normocephalic. Oropharynx and nasopharynx clear.  NECK:  Supple, no jugular venous distention. No thyroid enlargement, no tenderness.  LUNGS:  Improved  breath sounds bilaterally, no wheezing, rales, rhonchi. No use of accessory muscles of respiration.  CARDIOVASCULAR: S1, S2 normal. No murmurs, rubs, or gallops.  ABDOMEN: Soft, nontender, nondistended. Bowel sounds present. No organomegaly or mass.  EXTREMITIES: No cyanosis, clubbing or edema b/l.    NEUROLOGIC: Cranial nerves II through XII are intact. No focal Motor or sensory deficits b/l.   PSYCHIATRIC: The patient is alert and oriented x 3.  SKIN: No obvious rash, lesion, or ulcer.    LABORATORY PANEL:   CBC  Recent Labs Lab 11/05/14 0227  WBC 8.7  HGB 10.0*  HCT 30.7*  PLT 171    Chemistries   Recent Labs Lab 11/02/14 0900  11/05/14 0227  NA 139  < > 138  K 3.0*  < > 4.1  CL 99*  < > 108  CO2 29  < > 23  GLUCOSE 121*  < > 118*  BUN 19  < > 9  CREATININE 0.92  < > 0.72  CALCIUM 9.0  < > 7.8*  MG  --   < > 1.9  AST 28  --   --   ALT 21  --   --   ALKPHOS 70  --   --   BILITOT 1.0  --   --   < > = values in this interval not displayed.  Cardiac Enzymes  Recent Labs  Lab 11/05/14 1054  TROPONINI 0.19*    RADIOLOGY:  Dg Chest Port 1 View  11/04/2014   CLINICAL DATA:  Pneumonia, worsening shortness of breath this morning  EXAM: PORTABLE CHEST - 1 VIEW  COMPARISON:  Portable exam 1416 hr compared to 11/02/2014.  FINDINGS: Mild enlargement of cardiac silhouette.  Increased infiltrates in the mid to lower lungs bilaterally.  Chronic elevation of RIGHT diaphragm.  Probable small pleural effusions bilaterally.  More focal opacity in the LEFT perihilar region remains, may represent asymmetric infiltrate but underlying mass not excluded.  No pneumothorax.  IMPRESSION: Increased pulmonary  infiltrates which could represent edema or infection.  Probable small bibasilar pleural effusions.  Persistent asymmetric density in the LEFT perihilar region may represent asymmetric infiltrate but radiographic followup until resolution required to exclude underlying mass.   Electronically Signed   By: Lavonia Dana M.D.   On: 11/04/2014 14:40     ASSESSMENT AND PLAN:   80-YEAR-OLD FEMALE WITH HISTORY OF COPD WHO WEARS OXYGEN AT NIGHT, CROHN'S DISEASE AND HYPERTENSION WHO PRESENTS WITH with fever and hypoxic respiratory failure.  1. Sepsis: Resolved. Initially met criteria with fever tachycardia and leukocytosis   - Blood cultures negative so far, urine culture pending - IV Levaquin for community acquired pneumonia  2. Acute respiratory failure:  - Due to pneumonia, community-acquired and COPD - Wears oxygen at night, now on 3 L supplemental oxygenation at all times - Repeat x-ray with increased opacities edema versus infection - discontinue IV fluids, echocardiogram performed for pulmonary edema result is pending  3. Community acquired pneumonia: - Cultures negative, continue Levaquin - Leukocytosis improved, no further fevers  4. COPD:  - started nebs and prednisone (on 5 mg daily at home, increase to 50 on 8/28) - Respiratory status has improved significantly over this time  5. Essential hypertension: Patient will continue Lotrel, HCTZ and metoprolol.  6. Hypokalemia: Resolved   7. Crohn's disease: Continue leflunomide and Pentasa.  8. History of CAD:  - continue aspirin, Plavix, statin, metoprolol - Appreciate cardiology consultation - Enzymes have been elevated, possibly due to respiratory distress echo is pending, continue to cycle enzymes  Case discussed with Care Management/Social Worker. Management plans discussed with the patient, family including 2 sons and her daughter and granddaughter  CODE STATUS: full  DVT Prophylaxis: lovenox  TOTAL TIME TAKING CARE OF  THIS PATIENT: 30 minutes.  >50% time spent on counselling and coordination of care pt and family   Myrtis Ser M.D on 11/05/2014 at 1:04 PM  Between 7am to 6pm - Pager - [phone]  After 6pm go to [email or web address] - password [email or web address] [facility]  Office  [phone]  CC: Primary care physician; [name], MD

## 2014-11-06 ENCOUNTER — Inpatient Hospital Stay: Payer: Medicare Other

## 2014-11-06 LAB — MAGNESIUM: Magnesium: 2.1 mg/dL (ref 1.7–2.4)

## 2014-11-06 MED ORDER — FUROSEMIDE 10 MG/ML IJ SOLN
40.0000 mg | Freq: Two times a day (BID) | INTRAMUSCULAR | Status: DC
  Administered 2014-11-06 – 2014-11-07 (×3): 40 mg via INTRAVENOUS
  Filled 2014-11-06 (×3): qty 4

## 2014-11-06 MED ORDER — SODIUM CHLORIDE 0.9 % IJ SOLN: 3.0000 mL | INTRAMUSCULAR | Status: DC | PRN

## 2014-11-06 MED ORDER — LEVOFLOXACIN 750 MG PO TABS
750.0000 mg | ORAL_TABLET | Freq: Every day | ORAL | Status: DC
  Administered 2014-11-06 – 2014-11-07 (×2): 750 mg via ORAL
  Filled 2014-11-06 (×2): qty 1

## 2014-11-06 NOTE — Progress Notes (Signed)
[facility] Encounter Note  Patient: Raven [name] / Admit Date: 11/02/2014 / Date of Encounter: 11/06/2014, 8:02 AM   Subjective: Continued shortness of breath but significantly improved. No evidence of chest discomfort  Review of Systems: Positive for: Shortness of breath and weakness Negative for: Vision change, hearing change, syncope, dizziness, nausea, vomiting,diarrhea, bloody stool, stomach pain, cough, congestion, diaphoresis, urinary frequency, urinary pain,skin lesions, skin rashes Others previously listed  Objective: Telemetry: Normal sinus rhythm Physical Exam: Blood pressure 139/73, pulse 96, temperature 98.3 F (36.8 C), temperature source Oral, resp. rate 19, height 5' 7"  (1.702 m), weight 185 lb 9.6 oz (84.188 kg), SpO2 92 %. Body mass index is 29.06 kg/(m^2). General: Well developed, well nourished, in no acute distress. Head: Normocephalic, atraumatic, sclera non-icteric, no xanthomas, nares are without discharge. Neck: No apparent masses Lungs: Normal respirations with no wheezes, no rhonchi, no rales , no crackles   Heart: Regular rate and rhythm, normal S1 S2, no murmur, no rub, no gallop, PMI is normal size and placement, carotid upstroke normal without bruit, jugular venous pressure normal Abdomen: Soft, non-tender, non-distended with normoactive bowel sounds. No hepatosplenomegaly. Abdominal aorta is normal size without bruit Extremities: No edema, no clubbing, no cyanosis, no ulcers,  Peripheral: 2+ radial, 2+ femoral, 2+ dorsal pedal pulses Neuro: Alert and oriented. Moves all extremities spontaneously. Psych:  Responds to questions appropriately with a normal affect.   Intake/Output Summary (Last 24 hours) at 11/06/14 0802 Last data filed at 11/05/14 1932  Gross per 24 hour  Intake    240 ml  Output   1200 ml  Net   -960 ml    Inpatient Medications:  . amitriptyline  20 mg Oral QHS  . amLODipine  5 mg Oral Daily   And  .  benazepril  20 mg Oral Daily  . antiseptic oral rinse  7 mL Mouth Rinse BID  . aspirin EC  81 mg Oral Daily  . atorvastatin  80 mg Oral Daily  . cholecalciferol  1,000 Units Oral Daily  . clopidogrel  75 mg Oral Daily  . vitamin B-12  500 mcg Oral Daily   And  . folic acid  0.5 mg Oral Daily  . enoxaparin (LOVENOX) injection  40 mg Subcutaneous Q24H  . hydrochlorothiazide  25 mg Oral Daily  . ipratropium-albuterol  3 mL Nebulization QID  . leflunomide  10 mg Oral Daily  . levofloxacin (LEVAQUIN) IV  750 mg Intravenous Q24H  . mesalamine  2,000 mg Oral BID  . metoprolol succinate  25 mg Oral Daily  . mometasone-formoterol  2 puff Inhalation BID  . multivitamin with minerals  1 tablet Oral Daily  . potassium chloride  10 mEq Oral BID  . predniSONE  50 mg Oral Q breakfast  . senna  1 tablet Oral Daily  . sodium chloride  3 mL Intravenous Q12H   Infusions:    Labs:  Recent Labs  11/04/14 0433 11/05/14 0227 11/06/14 0539  NA 140 138  --   K 4.0 4.1  --   CL 107 108  --   CO2 27 23  --   GLUCOSE 106* 118*  --   BUN 11 9  --   CREATININE 0.81 0.72  --   CALCIUM 7.9* 7.8*  --   MG 2.2 1.9 2.1   No results for input(s): AST, ALT, ALKPHOS, BILITOT, PROT, ALBUMIN in the last 72 hours.  Recent Labs  11/04/14 0433 11/05/14 0227  WBC 8.5  8.7  HGB 10.3* 10.0*  HCT 31.3* 30.7*  MCV 88.4 88.9  PLT 158 171    Recent Labs  11/05/14 0227 11/05/14 1054 11/05/14 1631 11/05/14 2314  TROPONINI 0.15* 0.19* 0.14* 0.11*   Invalid input(s): POCBNP No results for input(s): HGBA1C in the last 72 hours.   Weights: Filed Weights   11/02/14 0840 11/02/14 1134  Weight: 185 lb (83.915 kg) 185 lb 9.6 oz (84.188 kg)     Radiology/Studies:  Dg Chest 1 View  11/02/2014   CLINICAL DATA:  Fevers  EXAM: CHEST  1 VIEW  COMPARISON:  06/08/2014  FINDINGS: Cardiac shadow is within normal limits. Elevation the right hemidiaphragm is again seen. In the left perihilar region there is a  somewhat rounded area of increased density identified. There is some central lucency although this may be projectional in nature. The possibility of a cavitary lesion cannot be totally excluded. Given the normal appearing chest x-ray, 4 months ago this likely represents a infectious process. No bony abnormality is noted.  IMPRESSION: Somewhat rounded area of increased density identified in the left mid lung. There is some suggestion of lucency which may represent some cavitation. This likely represents an acute pneumonia. Followup is recommended in 3-4 weeks following appropriate therapy to assess for resolution. CT of the chest is recommended if the area persists.   Electronically Signed   By: Inez Catalina M.D.   On: 11/02/2014 09:23   Dg Chest Port 1 View  11/04/2014   CLINICAL DATA:  Pneumonia, worsening shortness of breath this morning  EXAM: PORTABLE CHEST - 1 VIEW  COMPARISON:  Portable exam 1416 hr compared to 11/02/2014.  FINDINGS: Mild enlargement of cardiac silhouette.  Increased infiltrates in the mid to lower lungs bilaterally.  Chronic elevation of RIGHT diaphragm.  Probable small pleural effusions bilaterally.  More focal opacity in the LEFT perihilar region remains, may represent asymmetric infiltrate but underlying mass not excluded.  No pneumothorax.  IMPRESSION: Increased pulmonary infiltrates which could represent edema or infection.  Probable small bibasilar pleural effusions.  Persistent asymmetric density in the LEFT perihilar region may represent asymmetric infiltrate but radiographic followup until resolution required to exclude underlying mass.   Electronically Signed   By: Lavonia Dana M.D.   On: 11/04/2014 14:40     Assessment and Recommendation  80 y.o. female with known coronary artery disease status post previous stenting essential hypertension and old myocardial infarction mixed hyperlipidemia with acute to the bronchitis pneumonia and diffuse weakness and discomfort with slight  elevation of troponin most consistent with demand ischemia rather than acute coronary syndrome 1. Aspirin and Plavix for further risk reduction of in-stent thrombosis 2. ACE inhibitor beta blocker and calcium channel blocker for hypertension control and previous myocardial infarction 3. High intensity cholesterol therapy with atorvastatin 4. Further cardiac diagnostics necessary at this time 5. Okay for discharge home from cardiac standpoint with follow-up next week  Signed, Serafina Royals M.D. FACC

## 2014-11-06 NOTE — Progress Notes (Signed)
PHARMACIST - PHYSICIAN COMMUNICATION CONCERNING: Antibiotic IV to Oral Route Change Policy  RECOMMENDATION: This patient is receiving levofloxacin 731m by the intravenous route.  Based on criteria approved by the Pharmacy and Therapeutics Committee, the antibiotic(s) is/are being converted to the equivalent oral dose form(s).   DESCRIPTION: These criteria include:  Patient is taking other meds by mouth  The patient has eaten >50% of meals in 24 hours  WBC <11.1  The patient is improving clinically and has a Tmax < 99.5 in the past 24 hours  If you have questions about this conversion, please contact the Pharmacy Department  []   ([phone])  [facility]   ([phone])  [facility]   ([phone])  [facility]   ([phone])  [facility]   ([phone])  [facility] 11/06/2014

## 2014-11-06 NOTE — Progress Notes (Signed)
[place] at [place] NAME: Raven [name]    MR#:  [id number]  DATE OF BIRTH:  1934-06-03  SUBJECTIVE:   Continues to feel better but still with high oxygen requirement. Dry cough.  REVIEW OF SYSTEMS:   Review of Systems  Constitutional: Positive for malaise/fatigue. Negative for fever, chills and weight loss.  HENT: Negative for ear discharge, ear pain and nosebleeds.   Eyes: Negative for blurred vision, pain and discharge.  Respiratory: Positive for cough and shortness of breath. Negative for sputum production, wheezing and stridor.   Cardiovascular: Negative for chest pain, palpitations, orthopnea and PND.  Gastrointestinal: Negative for nausea, vomiting, abdominal pain and diarrhea.  Genitourinary: Negative for urgency and frequency.  Musculoskeletal: Negative for back pain and joint pain.  Neurological: Positive for weakness. Negative for sensory change, speech change and focal weakness.  Psychiatric/Behavioral: Negative for depression. The patient is not nervous/anxious.     DRUG ALLERGIES:   Allergies  Allergen Reactions  . Penicillins Other (See Comments)  . Augmentin [Amoxicillin-Pot Clavulanate] Other (See Comments)    Gi upset  . Indocin [Indomethacin] Other (See Comments)    unknown  . Lodine [Etodolac] Other (See Comments)    Gi upset  . Methotrexate Derivatives Other (See Comments)    Gi upset  . Gold Rash  . Zantac [Ranitidine Hcl] Rash    VITALS:  Blood pressure 139/67, pulse 96, temperature 98.3 F (36.8 C), temperature source Oral, resp. rate 18, height 5' 7"  (1.702 m), weight 84.188 kg (185 lb 9.6 oz), SpO2 93 %.  PHYSICAL EXAMINATION:   Physical Exam  GENERAL:  80 y.o.-year-old patient lying in the bed with no acute distress.  EYES: Pupils equal, round, reactive to light and accommodation. No scleral icterus. Extraocular muscles intact.  HEENT: Head atraumatic, normocephalic. Oropharynx and  nasopharynx clear.  NECK:  Supple, no jugular venous distention. No thyroid enlargement, no tenderness.  LUNGS:  Bibasilar crackles, good air movement, dry cough CARDIOVASCULAR: S1, S2 normal. No murmurs, rubs, or gallops.  ABDOMEN: Soft, nontender, nondistended. Bowel sounds present. No organomegaly or mass.  EXTREMITIES: No cyanosis, clubbing or edema b/l.    NEUROLOGIC: Cranial nerves II through XII are intact. No focal Motor or sensory deficits b/l.   PSYCHIATRIC: The patient is alert and oriented x 3.  SKIN: No obvious rash, lesion, or ulcer.    LABORATORY PANEL:   CBC  Recent Labs Lab 11/05/14 0227  WBC 8.7  HGB 10.0*  HCT 30.7*  PLT 171    Chemistries   Recent Labs Lab 11/02/14 0900  11/05/14 0227 11/06/14 0539  NA 139  < > 138  --   K 3.0*  < > 4.1  --   CL 99*  < > 108  --   CO2 29  < > 23  --   GLUCOSE 121*  < > 118*  --   BUN 19  < > 9  --   CREATININE 0.92  < > 0.72  --   CALCIUM 9.0  < > 7.8*  --   MG  --   < > 1.9 2.1  AST 28  --   --   --   ALT 21  --   --   --   ALKPHOS 70  --   --   --   BILITOT 1.0  --   --   --   < > = values in this interval not displayed.  Cardiac Enzymes  Recent Labs Lab 11/05/14 2314  TROPONINI 0.11*    RADIOLOGY:  Dg Chest 2 View  11/06/2014   CLINICAL DATA:  Hypoxia with cough and shortness of breath. Recently hospitalized for pneumonia. Subsequent encounter.  EXAM: CHEST  2 VIEW  COMPARISON:  10/27/2014 and 11/02/2014.  FINDINGS: The heart size and mediastinal contours are stable. There is aortic atherosclerosis. Left perihilar infiltrate appears to be located in the superior segment of the left lower lobe base on the lateral view and is similar to the most recent examination. There is chronic interstitial prominence with central airway thickening and vascular congestion, but no overt pulmonary edema. Small bilateral pleural effusions are present. The bones appear unchanged. Telemetry leads overlie the chest.   IMPRESSION: No significant change in left lower lobe superior segment infiltrate consistent with pneumonia. Underlying changes of chronic obstructive pulmonary disease with small bilateral pleural effusions.  Followup PA and lateral chest X-ray is recommended in 3-4 weeks following trial of antibiotic therapy to ensure resolution and exclude underlying malignancy.   Electronically Signed   By: Richardean Sale M.D.   On: 11/06/2014 12:34     ASSESSMENT AND PLAN:   69-YEAR-OLD FEMALE WITH HISTORY OF COPD WHO WEARS OXYGEN AT NIGHT, CROHN'S DISEASE AND HYPERTENSION WHO PRESENTS WITH with fever and hypoxic respiratory failure.  1. Sepsis: Resolved. Initially met criteria with fever tachycardia and leukocytosis   - Blood cultures negative so far, urine culture pending - IV Levaquin for community acquired pneumonia  2. Acute respiratory failure:  - Due to pneumonia, community-acquired and COPD - Wears oxygen at night, now on 3 L supplemental oxygenation at all times - Repeat x-ray with increased opacities edema versus infection, there seems to be fluid in the fissures. Start standing dose Lasix - Continue Levaquin for pneumonia - Continue prednisone, DuoNeb's 4 COPD  3. Community acquired pneumonia: - Cultures negative, continue Levaquin - Leukocytosis improved, no further fevers  4. COPD:  - started nebs and prednisone (on 5 mg daily at home, increase to 50 on 8/28) - Respiratory status has improved significantly over this time  5. Essential hypertension: Patient will continue Lotrel, HCTZ and metoprolol.  6. Hypokalemia: Resolved   7. Crohn's disease: Continue leflunomide and Pentasa.  8. History of CAD:  - continue aspirin, Plavix, statin, metoprolol - Appreciate cardiology consultation - Enzymes have been elevated, possibly due to respiratory distress echo is pending, continue to cycle enzymes - Echo shows normal ejection fraction, no diastolic dysfunction  Case discussed with  Care Management/Social Worker. Management plans discussed with the patient, family including 2 sons and her daughter and granddaughter  CODE STATUS: full  DVT Prophylaxis: lovenox  TOTAL TIME TAKING CARE OF THIS PATIENT: 30 minutes.  >50% time spent on counselling and coordination of care pt and family   Myrtis Ser M.D on 11/06/2014 at 3:37 PM  Between 7am to 6pm - Pager - [phone]  After 6pm go to [email or web address] - password [email or web address] [facility]  Office  [phone]  CC: Primary care physician; [name], MD

## 2014-11-07 LAB — BASIC METABOLIC PANEL
Anion gap: 11 (ref 5–15)
BUN: 19 mg/dL (ref 6–20)
CO2: 30 mmol/L (ref 22–32)
Calcium: 8.7 mg/dL — ABNORMAL LOW (ref 8.9–10.3)
Chloride: 100 mmol/L — ABNORMAL LOW (ref 101–111)
Creatinine, Ser: 0.97 mg/dL (ref 0.44–1.00)
GFR calc Af Amer: >60 mL/min (ref >60)
GFR calc non Af Amer: 55 mL/min — ABNORMAL LOW (ref >60)
Glucose, Bld: 119 mg/dL — ABNORMAL HIGH (ref 65–99)
Potassium: 3.9 mmol/L (ref 3.5–5.1)
Sodium: 141 mmol/L (ref 135–145)

## 2014-11-07 LAB — CBC
HCT: 33.6 % — ABNORMAL LOW (ref 35.0–47.0)
Hemoglobin: 11 g/dL — ABNORMAL LOW (ref 12.0–16.0)
MCH: 28.5 pg (ref 26.0–34.0)
MCHC: 32.6 g/dL (ref 32.0–36.0)
MCV: 87.5 fL (ref 80.0–100.0)
Platelets: 278 10*3/uL (ref 150–440)
RBC: 3.84 MIL/uL (ref 3.80–5.20)
RDW: 14.3 % (ref 11.5–14.5)
WBC: 8.3 10*3/uL (ref 3.6–11.0)

## 2014-11-07 LAB — CULTURE, BLOOD (ROUTINE X 2)
Culture: NO GROWTH
Culture: NO GROWTH

## 2014-11-07 LAB — MAGNESIUM: Magnesium: 2 mg/dL (ref 1.7–2.4)

## 2014-11-07 MED ORDER — PANTOPRAZOLE SODIUM 40 MG PO TBEC
40.0000 mg | DELAYED_RELEASE_TABLET | Freq: Every day | ORAL | Status: DC
  Administered 2014-11-07 – 2014-11-08 (×2): 40 mg via ORAL
  Filled 2014-11-07 (×2): qty 1

## 2014-11-07 NOTE — Progress Notes (Signed)
Raven [name] at [place] NAME: Raven [name]    MR#:  [id number]  DATE OF BIRTH:  June 23, 1934  SUBJECTIVE:   no specific complaints other than shortness of breath. Dry cough. Energy level and appetite have improved  REVIEW OF SYSTEMS:   Review of Systems  Constitutional: Positive for malaise/fatigue. Negative for fever, chills and weight loss.  HENT: Negative for ear discharge, ear pain and nosebleeds.   Eyes: Negative for blurred vision, pain and discharge.  Respiratory: Positive for cough and shortness of breath. Negative for sputum production, wheezing and stridor.   Cardiovascular: Negative for chest pain, palpitations, orthopnea and PND.  Gastrointestinal: Negative for nausea, vomiting, abdominal pain and diarrhea.  Genitourinary: Negative for urgency and frequency.  Musculoskeletal: Negative for back pain and joint pain.  Neurological: Positive for weakness. Negative for sensory change, speech change and focal weakness.  Psychiatric/Behavioral: Negative for depression. The patient is not nervous/anxious.     DRUG ALLERGIES:   Allergies  Allergen Reactions  . Penicillins Other (See Comments)  . Augmentin [Amoxicillin-Pot Clavulanate] Other (See Comments)    Gi upset  . Indocin [Indomethacin] Other (See Comments)    unknown  . Lodine [Etodolac] Other (See Comments)    Gi upset  . Methotrexate Derivatives Other (See Comments)    Gi upset  . Gold Rash  . Zantac [Ranitidine Hcl] Rash    VITALS:  Blood pressure 126/63, pulse 101, temperature 97.8 F (36.6 C), temperature source Oral, resp. rate 18, height _0  (1.702 m), weight 80.65 kg (177 lb 12.8 oz), SpO2 94 %.  PHYSICAL EXAMINATION:   Physical Exam  GENERAL:  80 y.o.-year-old patient lying in the bed with no acute distress.  EYES: Pupils equal, round, reactive to light and accommodation. No scleral icterus. Extraocular muscles intact.  HEENT: Head atraumatic,  normocephalic. Oropharynx and nasopharynx clear.  NECK:  Supple, no jugular venous distention. No thyroid enlargement, no tenderness.  LUNGS:  Bibasilar crackles, good air movement, dry cough CARDIOVASCULAR: S1, S2 normal. No murmurs, rubs, or gallops.  ABDOMEN: Soft, nontender, nondistended. Bowel sounds present. No organomegaly or mass.  EXTREMITIES: No cyanosis, clubbing or edema b/l.    NEUROLOGIC: Cranial nerves II through XII are intact. No focal Motor or sensory deficits b/l.   PSYCHIATRIC: The patient is alert and oriented x 3.  SKIN: No obvious rash, lesion, or ulcer.    LABORATORY PANEL:   CBC  Recent Labs Lab 11/07/14 0506  WBC 8.3  HGB 11.0*  HCT 33.6*  PLT 278    Chemistries   Recent Labs Lab 11/02/14 0900  11/07/14 0506  NA 139  < > 141  K 3.0*  < > 3.9  CL 99*  < > 100*  CO2 29  < > 30  GLUCOSE 121*  < > 119*  BUN 19  < > 19  CREATININE 0.92  < > 0.97  CALCIUM 9.0  < > 8.7*  MG  --   < > 2.0  AST 28  --   --   ALT 21  --   --   ALKPHOS 70  --   --   BILITOT 1.0  --   --   < > = values in this interval not displayed.  Cardiac Enzymes  Recent Labs Lab 11/05/14 2314  TROPONINI 0.11*    RADIOLOGY:  Dg Chest 2 View  11/06/2014   CLINICAL DATA:  Hypoxia with cough and shortness of breath. Recently  hospitalized for pneumonia. Subsequent encounter.  EXAM: CHEST  2 VIEW  COMPARISON:  10/27/2014 and 11/02/2014.  FINDINGS: The heart size and mediastinal contours are stable. There is aortic atherosclerosis. Left perihilar infiltrate appears to be located in the superior segment of the left lower lobe base on the lateral view and is similar to the most recent examination. There is chronic interstitial prominence with central airway thickening and vascular congestion, but no overt pulmonary edema. Small bilateral pleural effusions are present. The bones appear unchanged. Telemetry leads overlie the chest.  IMPRESSION: No significant change in left lower lobe  superior segment infiltrate consistent with pneumonia. Underlying changes of chronic obstructive pulmonary disease with small bilateral pleural effusions.  Followup PA and lateral chest X-ray is recommended in 3-4 weeks following trial of antibiotic therapy to ensure resolution and exclude underlying malignancy.   Electronically Signed   By: Raven [name] M.D.   On: 11/06/2014 12:34     ASSESSMENT AND PLAN:   80-YEAR-OLD FEMALE WITH HISTORY OF COPD WHO WEARS OXYGEN AT NIGHT, CROHN'S DISEASE AND HYPERTENSION WHO PRESENTS WITH with fever and hypoxic respiratory failure.  1. Sepsis: Resolved. Initially met criteria with fever tachycardia and leukocytosis   - Blood cultures negative so far, urine culture pending - IV Levaquin for community acquired pneumonia  2. Acute respiratory failure:  - Due to pneumonia, community-acquired and COPD - Wears oxygen at night, now on 4 L supplemental oxygenation at all times - Repeat x-ray with increased opacities edema versus infection, there seems to be fluid in the fissures. Continue Lasix- Continue Levaquin for pneumonia - Continue prednisone, DuoNeb's 4 COPD - Due to lack of progress I have consulted her primary pulmonologist Dr. Vella Kohler  3. Community acquired pneumonia: - Cultures negative, continue Levaquin - Leukocytosis improved, no further fevers  4. COPD:  - started nebs and prednisone (on 5 mg daily at home, increase to 50 on 8/28)  5. Essential hypertension: Patient will continue Lotrel, HCTZ and metoprolol.  6. Hypokalemia: Resolved   7. Crohn's disease: Continue leflunomide and Pentasa.  8. History of CAD:  - continue aspirin, Plavix, statin, metoprolol - Appreciate cardiology consultation - Enzymes have been elevated, possibly due to respiratory distress echo is pending, continue to cycle enzymes - Echo shows normal ejection fraction, no diastolic dysfunction  Case discussed with Care Management/Social Worker. Management plans  discussed with the patient, family including 2 sons and her daughter and granddaughter  CODE STATUS: full  DVT Prophylaxis: lovenox  TOTAL TIME TAKING CARE OF THIS PATIENT: 30 minutes.  >50% time spent on counselling and coordination of care pt and family   Raven Ser M.D on 11/07/2014 at 4:57 PM  Between 7am to 6pm - Pager - [phone]  After 6pm go to [email or web address] - password [email or web address] [facility]  Office  [phone]  CC: Primary care physician; [name], MD

## 2014-11-07 NOTE — Progress Notes (Signed)
Date: 11/07/2014,   MRN# [id number] Raven [name] 1934-07-24 Code Status:     Code Status Orders        Start     Ordered   11/02/14 1203  Full code   Continuous     11/02/14 1202     Hosp day:@LENGTHOFSTAYDAYS @ Referring MD: @ATDPROV @         AdmissionWeight: 185 lb (83.915 kg)                 CurrentWeight: 177 lb 12.8 oz (80.65 kg)  CC: Respiratory failure  HPI: This is a 80 year old lady well known to me, mild copd, nocturnal oxygen desaturation, bronchiectasis, diaphragm paralysis, Chron's disease and hypertension came in c/o being weak, lethargic and fever. On w/u appears to have a left pneumonia. Today wide awake, on oxygen, no wheezing, ambulated with assistance, no confusion  PMHX:   Past Medical History  Diagnosis Date  . Arthritis   . Asthma   . COPD (chronic obstructive pulmonary disease)   . Crohn's disease   . Pneumonia   . Hypertension   . Hyperlipidemia   . Myocardial infarction   . Peptic ulcer disease   . Osteopenia   . Depression   . Anemia    Surgical Hx:  Past Surgical History  Procedure Laterality Date  . Abdominal surgery    . Small intestine surgery    . Shoulder surgery Right     x 2   . Cardiac catheterization    . Colonoscopy with propofol N/A 09/27/2014    Procedure: COLONOSCOPY WITH PROPOFOL;  Surgeon: [name], MD;  Location: [facility] ENDOSCOPY;  Service: Gastroenterology;  Laterality: N/A;   Family Hx:  History reviewed. No pertinent family history. Social Hx:   Social History  Substance Use Topics  . Smoking status: Former Research scientist (life sciences)  . Smokeless tobacco: None  . Alcohol Use: No   Medication:    Home Medication:  No current outpatient prescriptions on file.  Current Medication: @CURMEDTAB @   Allergies:  Penicillins; Augmentin; Indocin; Lodine; Methotrexate derivatives; Gold; and Zantac  Review of Systems: Gen:  Denies  fever, sweats, chills HEENT: Denies blurred vision, double vision, ear pain, eye pain, hearing loss,  nose bleeds, sore throat Cvc:  No dizziness, chest pain or heaviness Resp:   Less son, less coughing, no pleurisy nor hemoptysis Gi: Denies swallowing difficulty, stomach pain, nausea or vomiting, diarrhea, constipation, bowel incontinence Gu:  Denies bladder incontinence, burning urine Ext:   No Joint pain, stiffness or swelling Skin: No skin rash, easy bruising or bleeding or hives Endoc:  No polyuria, polydipsia , polyphagia or weight change Psych: No depression, insomnia or hallucinations  Other:  All other systems negative  Physical Examination:   VS: BP 126/63 mmHg  Pulse 101  Temp(Src) 97.8 F (36.6 C) (Oral)  Resp 18  Ht 5' 7"  (1.702 m)  Wt 177 lb 12.8 oz (80.65 kg)  BMI 27.84 kg/m2  SpO2 94%  General Appearance: No distress, [place] 02 in place, good color  Neuro/psych without focal findings, mental status, speech normal, alert and oriented, cranial nerves 2-12 intact, reflexes normal and symmetric, sensation grossly normal  HEENT: PERRLA, EOM intact, no ptosis, no other lesions noticed, Mallampati: Pulmonary:.Rare  wheezing, No rales  No consolidation   Cardiovascular:  Normal S1,S2.  No m/r/g.  Abdominal aorta pulsation normal.    Abdomen:Benign, Soft, non-tender, No masses, hepatosplenomegaly, No lymphadenopathy Endoc: No evident thyromegaly, no signs of acromegaly or Cushing features  Skin:   warm, no rashes, no ecchymosis  Extremities: normal, no cyanosis, clubbing, no edema, warm with normal capillary refill. Other findings:   Labs results:   Recent Labs     11/05/14  0227  11/07/14  0506  HGB  10.0*  11.0*  HCT  30.7*  33.6*  MCV  88.9  87.5  WBC  8.7  8.3  BUN  9  19  CREATININE  0.72  0.97  GLUCOSE  118*  119*  CALCIUM  7.8*  8.7*  ,   Rad results:  CLINICAL DATA: Hypoxia with cough and shortness of breath. Recently hospitalized for pneumonia. Subsequent encounter.  EXAM: CHEST 2 VIEW  COMPARISON: 10/27/2014 and 11/02/2014.  FINDINGS: The  heart size and mediastinal contours are stable. There is aortic atherosclerosis. Left perihilar infiltrate appears to be located in the superior segment of the left lower lobe base on the lateral view and is similar to the most recent examination. There is chronic interstitial prominence with central airway thickening and vascular congestion, but no overt pulmonary edema. Small bilateral pleural effusions are present. The bones appear unchanged. Telemetry leads overlie the chest.  IMPRESSION: No significant change in left lower lobe superior segment infiltrate consistent with pneumonia. Underlying changes of chronic obstructive pulmonary disease with small bilateral pleural effusions.  Followup PA and lateral chest X-ray is recommended in 3-4 weeks following trial of antibiotic therapy to ensure resolution and exclude underlying malignancy.   Electronically Signed  By: Richardean Sale M.D.  On: 11/06/2014 12:34  SPIROMETRY: FVC was 2.29 liters, 85% of predicted FEV1 was 1.66, 86% of predicted FEV1 ratio was 72 FEF 25-75% liters per second was 75% of predicted  FLOW VOLUME LOOP: Normal  Impression ? Early restriction  *Compared to Previous Study, numbers are essentially consistent.  Assessment and Plan: .  Chronic airway obstruction, mild, no worsening sob, her main complain is being weak, deconditioned  .  Hx of nocturnal hypoxemia    .  Hx of diaphragm paralysis    .  Abnormal CT of the chest 10 mm RUL nodular density , s/p non diagnostic bronch        Bronchietasis   Plan: -continue present regimen -continue nocturnal oxygen as is -wean day time fio2 as tolerated -physical therapy -check thyroid profile, cpk -echo (bilateral small effusion) -out patient follow up.  -further orders per above     I have personally obtained a history, examined the patient, evaluated laboratory and imaging results, formulated the assessment and plan and placed  orders.  The Patient requires high complexity decision making for assessment and support, frequent evaluation and titration of therapies, application of advanced monitoring technologies and extensive interpretation of multiple databases.   Keylah Darwish,M.D. Pulmonary & Critical care Medicine [facility]

## 2014-11-07 NOTE — Progress Notes (Signed)
Patient has been resting quietly today. Son visited earlier in the morning. No complaints of pain and no signs of distress noted. Nursing staff will continue to monitor. Earleen Reaper, RN

## 2014-11-07 NOTE — Care Management (Signed)
Patient presents from home.  Lives alone.  Family checks in on her daily.  She has 02 in the home through [employer] that is only for hs. Says she does have a portable concentrator that "I bought for myself" through another company because I travel to [place] often with my family.  Discussed the possible need for continuous 02.  Says she would like to use the company that has provided her portable concentrator - but does not know the name of the company.  Will have her son look at the equipment. .  She has had a home health nurse once and it was not a positive experience.  Discussed the need to maintain activity level and assess for for need of continuous home 02 during progression

## 2014-11-08 LAB — BASIC METABOLIC PANEL
Anion gap: 9 (ref 5–15)
BUN: 26 mg/dL — ABNORMAL HIGH (ref 6–20)
CO2: 32 mmol/L (ref 22–32)
Calcium: 8.8 mg/dL — ABNORMAL LOW (ref 8.9–10.3)
Chloride: 98 mmol/L — ABNORMAL LOW (ref 101–111)
Creatinine, Ser: 1.26 mg/dL — ABNORMAL HIGH (ref 0.44–1.00)
GFR calc Af Amer: 46 mL/min — ABNORMAL LOW (ref >60)
GFR calc non Af Amer: 40 mL/min — ABNORMAL LOW (ref >60)
Glucose, Bld: 108 mg/dL — ABNORMAL HIGH (ref 65–99)
Potassium: 3.6 mmol/L (ref 3.5–5.1)
Sodium: 139 mmol/L (ref 135–145)

## 2014-11-08 LAB — CBC
HCT: 32.8 % — ABNORMAL LOW (ref 35.0–47.0)
Hemoglobin: 10.8 g/dL — ABNORMAL LOW (ref 12.0–16.0)
MCH: 28.9 pg (ref 26.0–34.0)
MCHC: 32.8 g/dL (ref 32.0–36.0)
MCV: 88.1 fL (ref 80.0–100.0)
Platelets: 352 10*3/uL (ref 150–440)
RBC: 3.73 MIL/uL — ABNORMAL LOW (ref 3.80–5.20)
RDW: 14.5 % (ref 11.5–14.5)
WBC: 8.6 10*3/uL (ref 3.6–11.0)

## 2014-11-08 LAB — CK: Total CK: 266 U/L — ABNORMAL HIGH (ref 38–234)

## 2014-11-08 LAB — MAGNESIUM: Magnesium: 1.8 mg/dL (ref 1.7–2.4)

## 2014-11-08 LAB — TSH: TSH: 1.485 u[IU]/mL (ref 0.350–4.500)

## 2014-11-08 MED ORDER — PREDNISONE 5 MG PO TABS: ORAL_TABLET | ORAL | Status: DC

## 2014-11-08 MED ORDER — LEVOFLOXACIN 500 MG PO TABS: 500.0000 mg | ORAL_TABLET | Freq: Every day | ORAL | Status: DC

## 2014-11-08 NOTE — Progress Notes (Signed)
SATURATION QUALIFICATIONS: (This note is used to comply with regulatory documentation for home oxygen)  Patient Saturations on Room Air at Rest = 80%  Patient Saturations on Room Air while Ambulating = 76%  Patient Saturations on 4 Liters of oxygen while Ambulating = 91%  Please briefly explain why patient needs home oxygen:

## 2014-11-08 NOTE — Discharge Summary (Signed)
[facility] NAME: Raven [name]    MR#:  [id number]  DATE OF BIRTH:  09-05-1934  DATE OF ADMISSION:  11/02/2014 ADMITTING PHYSICIAN: Bettey Costa, MD  DATE OF DISCHARGE: 11/08/2014  PRIMARY CARE PHYSICIAN: [name], MD    ADMISSION DIAGNOSIS:  Community acquired pneumonia [J18.9]  DISCHARGE DIAGNOSIS:  Active Problems:   CAP (community acquired pneumonia)   SECONDARY DIAGNOSIS:   Past Medical History  Diagnosis Date  . Arthritis   . Asthma   . COPD (chronic obstructive pulmonary disease)   . Crohn's disease   . Pneumonia   . Hypertension   . Hyperlipidemia   . Myocardial infarction   . Peptic ulcer disease   . Osteopenia   . Depression   . Anemia     HOSPITAL COURSE:   1. Acute respiratory failure with hypoxia. This is now a chronic problem this is now chronic respiratory failure. Pulse ox on room air 80%. Patient once to go home today and she will need 24 7 oxygen. 2. Clinical sepsis with pneumonia left lower lobe community-acquired. Patient was put on high-dose Levaquin. I will give another week's worth of Levaquin upon discharge of 500 mg. 3. COPD exacerbation. Place on steroids taper. 4. Crohn's disease can continue usual medications 5. Hyperlipidemia unspecified on atorvastatin 6. Essential hypertension blood pressure stable on usual medications.   DISCHARGE CONDITIONS:   Satisfactory   CONSULTS OBTAINED:  Treatment Team:  Corey Skains, MD  DRUG ALLERGIES:   Allergies  Allergen Reactions  . Penicillins Other (See Comments)  . Augmentin [Amoxicillin-Pot Clavulanate] Other (See Comments)    Gi upset  . Indocin [Indomethacin] Other (See Comments)    unknown  . Lodine [Etodolac] Other (See Comments)    Gi upset  . Methotrexate Derivatives Other (See Comments)    Gi upset  . Gold Rash  . Zantac [Ranitidine Hcl] Rash    DISCHARGE MEDICATIONS:   Current Discharge  Medication List    START taking these medications   Details  levofloxacin (LEVAQUIN) 500 MG tablet Take 1 tablet (500 mg total) by mouth daily. Qty: 7 tablet, Refills: 0      CONTINUE these medications which have CHANGED   Details  predniSONE (DELTASONE) 5 MG tablet 5 tabs day1; 4 tabs day2; 3 tabs day3,4; 2 tabs day5,6 the one tab daily afterwards Qty: 40 tablet, Refills: 0      CONTINUE these medications which have NOT CHANGED   Details  acetaminophen (TYLENOL) 500 MG tablet Take 500 mg by mouth every 6 (six) hours as needed.    ACTONEL 150 MG tablet Take 150 mg by mouth every 30 (thirty) days.     albuterol (PROVENTIL HFA;VENTOLIN HFA) 108 (90 BASE) MCG/ACT inhaler Inhale 2 puffs into the lungs every 6 (six) hours as needed for wheezing or shortness of breath.    amitriptyline (ELAVIL) 10 MG tablet Take 20 mg by mouth at bedtime.     amLODipine-benazepril (LOTREL) 5-20 MG per capsule Take 1 capsule by mouth daily.     aspirin EC 81 MG tablet Take 81 mg by mouth daily.    atorvastatin (LIPITOR) 80 MG tablet Take 80 mg by mouth daily.    Calcium Carb-Cholecalciferol (CALCIUM 500+D PO) Take 1 tablet by mouth daily.    Cholecalciferol (VITAMIN D-3) 1000 UNITS CAPS Take 1 capsule by mouth daily.    clopidogrel (PLAVIX) 75 MG tablet Take 75 mg by mouth daily.  Cobalamine Combinations (VITAMIN B12-FOLIC ACID) 222-979 MCG TABS Take 1 tablet by mouth daily.    Fluticasone-Salmeterol (ADVAIR HFA IN) Inhale into the lungs 2 (two) times daily.    HUMIRA 40 MG/0.8ML injection Inject 40 mg into the skin every 14 (fourteen) days.     hydrochlorothiazide (HYDRODIURIL) 25 MG tablet Take 25 mg by mouth daily.     leflunomide (ARAVA) 10 MG tablet Take 10 mg by mouth daily.     metoprolol succinate (TOPROL-XL) 25 MG 24 hr tablet Take 25 mg by mouth daily.    Multiple Vitamin (MULTIVITAMIN) capsule Take 1 capsule by mouth daily.    NEXIUM 40 MG capsule Take 40 mg by mouth daily at  12 noon.     nitroGLYCERIN (NITROSTAT) 0.4 MG SL tablet Place 0.4 mg under the tongue every 5 (five) minutes as needed for chest pain.    ofloxacin (OCUFLOX) 0.3 % ophthalmic solution Place 1 drop into both eyes daily as needed.    PENTASA 500 MG CR capsule Take 2,000 mg by mouth 2 (two) times daily.     potassium chloride (K-DUR,KLOR-CON) 10 MEQ tablet Take 10 mEq by mouth 2 (two) times daily.    senna (SENOKOT) 8.6 MG tablet Take 1 tablet by mouth daily.         DISCHARGE INSTRUCTIONS:   Follow-up with Dr. Raul Del next week   follow-up Dr. Lisette Grinder 1 week  If you experience worsening of your admission symptoms, develop shortness of breath, life threatening emergency, suicidal or homicidal thoughts you must seek medical attention immediately by calling 911 or calling your MD immediately  if symptoms less severe.  You Must read complete instructions/literature along with all the possible adverse reactions/side effects for all the Medicines you take and that have been prescribed to you. Take any new Medicines after you have completely understood and accept all the possible adverse reactions/side effects.   Please note  You were cared for by a hospitalist during your hospital stay. If you have any questions about your discharge medications or the care you received while you were in the hospital after you are discharged, you can call the unit and asked to speak with the hospitalist on call if the hospitalist that took care of you is not available. Once you are discharged, your primary care physician will handle any further medical issues. Please note that NO REFILLS for any discharge medications will be authorized once you are discharged, as it is imperative that you return to your primary care physician (or establish a relationship with a primary care physician if you do not have one) for your aftercare needs so that they can reassess your need for medications and monitor your lab  values.    Today   CHIEF COMPLAINT:   Chief Complaint  Patient presents with  . Fever    HISTORY OF PRESENT ILLNESS:  Raven [name]  is a 80 y.o. female with a known history of COPD, Crohn's disease. She presented with a fever and found to have pneumonia. As per son was also delirious upon presentation.   VITAL SIGNS:  Blood pressure 138/87, pulse 87, temperature 98.7 F (37.1 C), temperature source Oral, resp. rate 20, height 5' 7"  (1.702 m), weight 80.287 kg (177 lb), SpO2 91 %.    PHYSICAL EXAMINATION:  GENERAL:  80 y.o.-year-old patient lying in the bed with no acute distress.  EYES: Pupils equal, round, reactive to light and accommodation. No scleral icterus. Extraocular muscles intact.  HEENT: Head  atraumatic, normocephalic. Oropharynx and nasopharynx clear.  NECK:  Supple, no jugular venous distention. No thyroid enlargement, no tenderness.  LUNGS:  breat decreased h sounds bilaterally, slight wheezing bilateral lung bases ,  no rales,rhonchi or crepitation. No use of accessory muscles of respiration.  CARDIOVASCULAR: S1, S2 normal.  2/6 systolic murmurs, no rubs, or gallops.  ABDOMEN: Soft, non-tender, non-distended. Bowel sounds present. No organomegaly or mass.  EXTREMITIES: No pedal edema, cyanosis, or clubbing.  NEUROLOGIC: Cranial nerves II through XII are intact. Muscle strength 5/5 in all extremities. Sensation intact. Gait not checked.  PSYCHIATRIC: The patient is alert and oriented x 3.  SKIN: No obvious rash, lesion, or ulcer.   DATA REVIEW:   CBC  Recent Labs Lab 11/08/14 0520  WBC 8.6  HGB 10.8*  HCT 32.8*  PLT 352    Chemistries   Recent Labs Lab 11/02/14 0900  11/08/14 0520  NA 139  < > 139  K 3.0*  < > 3.6  CL 99*  < > 98*  CO2 29  < > 32  GLUCOSE 121*  < > 108*  BUN 19  < > 26*  CREATININE 0.92  < > 1.26*  CALCIUM 9.0  < > 8.8*  MG  --   < > 1.8  AST 28  --   --   ALT 21  --   --   ALKPHOS 70  --   --   BILITOT 1.0  --   --    < > = values in this interval not displayed.  Cardiac Enzymes  Recent Labs Lab 11/05/14 2314  TROPONINI 0.11*    Microbiology Results  Results for orders placed or performed during the hospital encounter of 11/02/14  Blood culture (routine x 2)     Status: None   Collection Time: 11/02/14  9:00 AM  Result Value Ref Range Status   Specimen Description BLOOD LEFT ASSIST CONTROL  Final   Special Requests   Final    BOTTLES DRAWN AEROBIC AND ANAEROBIC  AER [place] ANA [place]   Culture NO GROWTH 5 DAYS  Final   Report Status 11/07/2014 FINAL  Final  Blood culture (routine x 2)     Status: None   Collection Time: 11/02/14 10:00 AM  Result Value Ref Range Status   Specimen Description BLOOD RIGHT HAND  Final   Special Requests   Final    BOTTLES DRAWN AEROBIC AND ANAEROBIC 1CC AEROBOC,1CC ANAEROBIC   Culture NO GROWTH 5 DAYS  Final   Report Status 11/07/2014 FINAL  Final  Urine culture     Status: None   Collection Time: 11/02/14 11:41 AM  Result Value Ref Range Status   Specimen Description URINE, CATHETERIZED  Final   Special Requests Normal  Final   Culture MULTIPLE SPECIES PRESENT, SUGGEST RECOLLECTION  Final   Report Status 11/04/2014 FINAL  Final    Management plans discussed with the patient, family and they are in agreement.  CODE STATUS:     Code Status Orders        Start     Ordered   11/02/14 1203  Full code   Continuous     11/02/14 1202      TOTAL TIME TAKING CARE OF THIS PATIENT: 35 minutes.    Loletha Grayer M.D on 11/08/2014 at 11:42 AM  Between 7am to 6pm - Pager - [phone]  After 6pm go to [email or web address] - [facility]  Office  [phone]  CC: Primary care physician; [name], MD

## 2014-11-08 NOTE — Care Management Important Message (Signed)
Important Message  Patient Details  Name: KYLYNN STREET MRN: [id number] Date of Birth: 01-02-1935   Medicare Important Message Given:  Yes-third notification given    Juliann Pulse A Allmond 11/08/2014, 1:27 PM

## 2014-11-08 NOTE — Progress Notes (Signed)
IV and tele removed. 3 L of oxygen. NSR. Takes meds ok. A & O. Pt/ family educated on how to use oxygen tank. Pt has not reported any pain. Family at the bedside. Discharge instructions reviewed with the patient. Pt has no further concerns at this time. Family to take home.

## 2014-11-08 NOTE — Care Management (Signed)
Patient is anxious to discharge home.  She declines home health nursing after offering service multiple times.  She is going to require continuous 02 at 3 liters.  She had originally requested that her service be switched to agency that provided her with her portable concentrator ([employer]).  She has since changed her mind.  Provided [employer] with the change in order and portable tank was delivered

## 2014-11-08 NOTE — Progress Notes (Signed)
Date: 11/08/2014,   MRN# [id number] Raven [name] 1935-03-05 Code Status:     Code Status Orders        Start     Ordered   11/02/14 1203  Full code   Continuous     11/02/14 1202     Hosp day:@LENGTHOFSTAYDAYS @ Referring MD: @ATDPROV @      HPI: Less sob and cough today, mentating well, good insight.  PMHX:   Past Medical History  Diagnosis Date  . Arthritis   . Asthma   . COPD (chronic obstructive pulmonary disease)   . Crohn's disease   . Pneumonia   . Hypertension   . Hyperlipidemia   . Myocardial infarction   . Peptic ulcer disease   . Osteopenia   . Depression   . Anemia    Surgical Hx:  Past Surgical History  Procedure Laterality Date  . Abdominal surgery    . Small intestine surgery    . Shoulder surgery Right     x 2   . Cardiac catheterization    . Colonoscopy with propofol N/A 09/27/2014    Procedure: COLONOSCOPY WITH PROPOFOL;  Surgeon: [name], MD;  Location: [facility] ENDOSCOPY;  Service: Gastroenterology;  Laterality: N/A;   Family Hx:  History reviewed. No pertinent family history. Social Hx:   Social History  Substance Use Topics  . Smoking status: Former Research scientist (life sciences)  . Smokeless tobacco: None  . Alcohol Use: No   Medication:    Home Medication:  Current Outpatient Rx  Name  Route  Sig  Dispense  Refill  . levofloxacin (LEVAQUIN) 500 MG tablet   Oral   Take 1 tablet (500 mg total) by mouth daily.   7 tablet   0   . predniSONE (DELTASONE) 5 MG tablet      5 tabs day1; 4 tabs day2; 3 tabs day3,4; 2 tabs day5,6 the one tab daily afterwards   40 tablet   0     Current Medication: @CURMEDTAB @   Allergies:  Penicillins; Augmentin; Indocin; Lodine; Methotrexate derivatives; Gold; and Zantac  Review of Systems: Gen:  Denies  fever, sweats, chills HEENT: Denies blurred vision, double vision, ear pain, eye pain, hearing loss, nose bleeds, sore throat Cvc:  No dizziness, chest pain or heaviness Resp:   Less sob, cough, no pleurisy, no  blood Gi: Denies swallowing difficulty, stomach pain, nausea or vomiting, diarrhea, constipation, bowel incontinence Gu:  Denies bladder incontinence, burning urine Ext:   No Joint pain, stiffness or swelling Skin: No skin rash, easy bruising or bleeding or hives Endoc:  No polyuria, polydipsia , polyphagia or weight change Psych: No depression, insomnia or hallucinations  Other:  All other systems negative  Physical Examination:   VS: BP 137/85 mmHg  Pulse 88  Temp(Src) 98.6 F (37 C) (Oral)  Resp 20  Ht 5' 7"  (1.702 m)  Wt 177 lb (80.287 kg)  BMI 27.72 kg/m2  SpO2 97%  General Appearance: No distress  Neuro/psych without focal findings, mental status, speech normal, alert and oriented, cranial nerves 2-12 intact, reflexes normal and symmetric, sensation grossly normal  HEENT: PERRLA, EOM intact, no ptosis, no other lesions noticed Pulmonary:.No wheezing, No rales  Sputum Production:   Cardiovascular:  Normal S1,S2.  No m/r/g.   Abdomen:Benign, Soft, non-tender, No masses, hepatosplenomegaly, No lymphadenopathy Endoc: No evident thyromegaly, no signs of acromegaly or Cushing features Skin:   warm, no rashes, no ecchymosis  Extremities: normal, no cyanosis, clubbing, no edema, warm with normal capillary  refill. Other findings:   Labs results:   Recent Labs     11/07/14  0506  11/08/14  0520  HGB  11.0*  10.8*  HCT  33.6*  32.8*  MCV  87.5  88.1  WBC  8.3  8.6  BUN  19  26*  CREATININE  0.97  1.26*  GLUCOSE  119*  108*  CALCIUM  8.7*  8.8*  ,  Assessment and Plan: .  Chronic airway obstruction, mild, no worsening sob, her main complain is being weak, deconditioned clinically ok to go home today .  Hx of nocturnal hypoxemia    .  Hx of diaphragm paralysis    .  Abnormal CT of the chest 10 mm RUL nodular density , s/p non diagnostic bronch, following    Bronchietasis   Plan: -continue present regimen -continue nocturnal oxygen as is -wean day time  fio2 as tolerated -home today -check thyroid profile, cpk pending -echo (bilateral small effusion), pending -out patient follow up in one week    I have personally obtained a history, examined the patient, evaluated laboratory and imaging results, formulated the assessment and plan and placed orders.  The Patient requires high complexity decision making for assessment and support, frequent evaluation and titration of therapies, application of advanced monitoring technologies and extensive interpretation of multiple databases.   Karlyn Glasco,M.D. Pulmonary & Critical care Medicine [facility]

## 2014-11-08 NOTE — Discharge Instructions (Signed)
Acute Respiratory Failure °Respiratory failure is when your lungs are not working well and your breathing (respiratory) system fails. When respiratory failure occurs, it is difficult for your lungs to get enough oxygen, get rid of carbon dioxide, or both. Respiratory failure can be life threatening.  °Respiratory failure can be acute or chronic. Acute respiratory failure is sudden, severe, and requires emergency medical treatment. Chronic respiratory failure is less severe, happens over time, and requires ongoing treatment.  °WHAT ARE THE CAUSES OF ACUTE RESPIRATORY FAILURE?  °Any problem affecting the heart or lungs can cause acute respiratory failure. Some of these causes include the following: °· Chronic bronchitis and emphysema (COPD).   °· Blood clot going to a lung (pulmonary embolism).   °· Having water in the lungs caused by heart failure, lung injury, or infection (pulmonary edema).   °· Collapsed lung (pneumothorax).   °· Pneumonia.   °· Pulmonary fibrosis.   °· Obesity.   °· Asthma.   °· Heart failure.   °· Any type of trauma to the chest that can make breathing difficult.   °· Nerve or muscle diseases making chest movements difficult. °WHAT SYMPTOMS SHOULD YOU WATCH FOR?  °If you have any of these signs or symptoms, you should seek immediate medical care:  °· You have shortness of breath (dyspnea) with or without activity.   °· You have rapid, fast breathing (tachypnea).   °· You are wheezing. °· You are unable to say more than a few words without having to catch your breath. °· You find it very difficult to function normally. °· You have a fast heart rate.   °· You have a bluish color to your finger or toe nail beds.   °· You have confusion or drowsiness or both.   °HOW WILL MY ACUTE RESPIRATORY FAILURE BE TREATED?  °Treatment of acute respiratory failure depends on the cause of the respiratory failure. Usually, you will stay in the intensive care unit so your breathing can be watched closely. Treatment  can include the following: °· Oxygen. Oxygen can be delivered through the following: °¨ Nasal cannula. This is small tubing that goes in your nose to give you oxygen. °¨ Face mask. A face mask covers your nose and mouth to give you oxygen. °· Medicine. Different medicines can be given to help with breathing. These can include: °¨ Nebulizers. Nebulizers deliver medicines to open the air passages (bronchodilators). These medicines help to open or relax the airways in the lungs so you can breathe better. They can also help loosen mucus from your lungs. °¨ Diuretics. Diuretic medicines can help you breathe better by getting rid of extra water in your body. °¨ Steroids. Steroid medicines can help decrease swelling (inflammation) in your lungs. °¨ Antibiotics. °· Chest tube. If you have a collapsed lung (pneumothorax), a chest tube is placed to help reinflate the lung. °· Non-invasive positive pressure ventilation (NPPV). This is a tight-fitting mask that goes over your nose and mouth. The mask has tubing that is attached to a machine. The machine blows air into the tubing, which helps to keep the tiny air sacs (alveoli) in your lungs open. This machine allows you to breathe on your own. °· Ventilator. A ventilator is a breathing machine. When on a ventilator, a breathing tube is put into the lungs. A ventilator is used when you can no longer breathe well enough on your own. You may have low oxygen levels or high carbon dioxide (CO2) levels in your blood. When you are on a ventilator, sedation and pain medicines are given to make you sleep   so your lungs can heal. °Document Released: 02/28/2013 Document Revised: 07/10/2013 Document Reviewed: 02/28/2013 °[employer]® Patient Information ©2015 [employer]. This information is not intended to replace advice given to you by your health care provider. Make sure you discuss any questions you have with your health care provider. ° °

## 2015-02-15 ENCOUNTER — Encounter: Payer: Self-pay | Admitting: Emergency Medicine

## 2015-02-15 ENCOUNTER — Emergency Department: Payer: Medicare Other

## 2015-02-15 ENCOUNTER — Emergency Department
Admission: EM | Admit: 2015-02-15 | Discharge: 2015-02-15 | Disposition: A | Discharge status: Home / Self Care | Payer: Medicare Other | Attending: Emergency Medicine | Admitting: Emergency Medicine

## 2015-02-15 DIAGNOSIS — Z79899 Other long term (current) drug therapy: Secondary | ICD-10-CM | POA: Diagnosis not present

## 2015-02-15 DIAGNOSIS — R112 Nausea with vomiting, unspecified: Secondary | ICD-10-CM | POA: Diagnosis not present

## 2015-02-15 DIAGNOSIS — Z880 Allergy status to penicillin: Secondary | ICD-10-CM | POA: Insufficient documentation

## 2015-02-15 DIAGNOSIS — Z7951 Long term (current) use of inhaled steroids: Secondary | ICD-10-CM | POA: Diagnosis not present

## 2015-02-15 DIAGNOSIS — Z7982 Long term (current) use of aspirin: Secondary | ICD-10-CM | POA: Insufficient documentation

## 2015-02-15 DIAGNOSIS — R197 Diarrhea, unspecified: Secondary | ICD-10-CM

## 2015-02-15 DIAGNOSIS — K625 Hemorrhage of anus and rectum: Secondary | ICD-10-CM | POA: Diagnosis present

## 2015-02-15 DIAGNOSIS — R1032 Left lower quadrant pain: Secondary | ICD-10-CM | POA: Insufficient documentation

## 2015-02-15 DIAGNOSIS — R1030 Lower abdominal pain, unspecified: Secondary | ICD-10-CM

## 2015-02-15 DIAGNOSIS — Z87891 Personal history of nicotine dependence: Secondary | ICD-10-CM | POA: Insufficient documentation

## 2015-02-15 DIAGNOSIS — K921 Melena: Secondary | ICD-10-CM | POA: Insufficient documentation

## 2015-02-15 DIAGNOSIS — I10 Essential (primary) hypertension: Secondary | ICD-10-CM | POA: Diagnosis not present

## 2015-02-15 DIAGNOSIS — Z792 Long term (current) use of antibiotics: Secondary | ICD-10-CM | POA: Insufficient documentation

## 2015-02-15 LAB — TROPONIN I
Troponin I: 0.04 ng/mL — ABNORMAL HIGH (ref <0.031)
Troponin I: 0.04 ng/mL — ABNORMAL HIGH (ref <0.031)

## 2015-02-15 LAB — COMPREHENSIVE METABOLIC PANEL
ALT: 25 U/L (ref 14–54)
AST: 25 U/L (ref 15–41)
Albumin: 3.7 g/dL (ref 3.5–5.0)
Alkaline Phosphatase: 54 U/L (ref 38–126)
Anion gap: 12 (ref 5–15)
BUN: 23 mg/dL — ABNORMAL HIGH (ref 6–20)
CO2: 23 mmol/L (ref 22–32)
Calcium: 8.2 mg/dL — ABNORMAL LOW (ref 8.9–10.3)
Chloride: 104 mmol/L (ref 101–111)
Creatinine, Ser: 0.8 mg/dL (ref 0.44–1.00)
GFR calc Af Amer: >60 mL/min (ref >60)
GFR calc non Af Amer: >60 mL/min (ref >60)
Glucose, Bld: 89 mg/dL (ref 65–99)
Potassium: 2.9 mmol/L — CL (ref 3.5–5.1)
Sodium: 139 mmol/L (ref 135–145)
Total Bilirubin: 0.8 mg/dL (ref 0.3–1.2)
Total Protein: 6.7 g/dL (ref 6.5–8.1)

## 2015-02-15 LAB — CBC
HCT: 40 % (ref 35.0–47.0)
Hemoglobin: 13.1 g/dL (ref 12.0–16.0)
MCH: 28.9 pg (ref 26.0–34.0)
MCHC: 32.7 g/dL (ref 32.0–36.0)
MCV: 88.4 fL (ref 80.0–100.0)
Platelets: 285 10*3/uL (ref 150–440)
RBC: 4.53 MIL/uL (ref 3.80–5.20)
RDW: 14.8 % — ABNORMAL HIGH (ref 11.5–14.5)
WBC: 8.3 10*3/uL (ref 3.6–11.0)

## 2015-02-15 LAB — MAGNESIUM: Magnesium: 1.8 mg/dL (ref 1.7–2.4)

## 2015-02-15 LAB — LIPASE, BLOOD: Lipase: 25 U/L (ref 11–51)

## 2015-02-15 MED ORDER — POTASSIUM CHLORIDE 10 MEQ/100ML IV SOLN
10.0000 meq | INTRAVENOUS | Status: AC
  Administered 2015-02-15 (×2): 10 meq via INTRAVENOUS
  Filled 2015-02-15 (×2): qty 100

## 2015-02-15 MED ORDER — IOHEXOL 300 MG/ML  SOLN
100.0000 mL | Freq: Once | INTRAMUSCULAR | Status: AC | PRN
  Administered 2015-02-15: 100 mL via INTRAVENOUS

## 2015-02-15 MED ORDER — ONDANSETRON HCL 4 MG PO TABS: 4.0000 mg | ORAL_TABLET | Freq: Every day | ORAL | Status: DC | PRN

## 2015-02-15 MED ORDER — ONDANSETRON HCL 4 MG/2ML IJ SOLN
4.0000 mg | Freq: Once | INTRAMUSCULAR | Status: AC
  Administered 2015-02-15: 4 mg via INTRAVENOUS
  Filled 2015-02-15: qty 2

## 2015-02-15 MED ORDER — SODIUM CHLORIDE 0.9 % IV BOLUS (SEPSIS)
1000.0000 mL | Freq: Once | INTRAVENOUS | Status: AC
  Administered 2015-02-15: 1000 mL via INTRAVENOUS

## 2015-02-15 MED ORDER — BACITRACIN ZINC 500 UNIT/GM EX OINT
TOPICAL_OINTMENT | CUTANEOUS | Status: AC
  Filled 2015-02-15: qty 0.9

## 2015-02-15 MED ORDER — DICYCLOMINE HCL 20 MG PO TABS: 20.0000 mg | ORAL_TABLET | Freq: Three times a day (TID) | ORAL | Status: DC | PRN

## 2015-02-15 MED ORDER — METRONIDAZOLE 500 MG PO TABS: 500.0000 mg | ORAL_TABLET | Freq: Three times a day (TID) | ORAL | Status: DC

## 2015-02-15 MED ORDER — POTASSIUM CHLORIDE CRYS ER 20 MEQ PO TBCR
40.0000 meq | EXTENDED_RELEASE_TABLET | Freq: Once | ORAL | Status: AC
  Administered 2015-02-15: 40 meq via ORAL
  Filled 2015-02-15: qty 2

## 2015-02-15 MED ORDER — IOHEXOL 240 MG/ML SOLN
25.0000 mL | Freq: Once | INTRAMUSCULAR | Status: AC | PRN
  Administered 2015-02-15: 25 mL via ORAL

## 2015-02-15 NOTE — Discharge Instructions (Signed)

## 2015-02-15 NOTE — ED Provider Notes (Addendum)
[facility] Emergency Department Provider Note  ____________________________________________  Time seen: Approximately 130 PM  I have reviewed the triage vital signs and the nursing notes.   HISTORY  Chief Complaint Rectal Bleeding    HPI Raven [name] is a 80 y.o. female with a history of Crohn's disease and recent diagnosis of pneumonia was presenting today with nausea vomiting and diarrhea since this past Tuesday. She said that she ate at a restaurant this past Tuesday night and then afterwards vomited multiple times overnight. She said that she had clear vomitus that evening. Raven [name] that she has not vomited since Wednesday but since then has had 1-2 episodes per day of loose stool which she describes as dark and almost black. She said that she also took his mall yesterday which she is concerned may have made her stool black. She was seen by her primary care doctor earlier this morning is centered to the emergency department for further evaluation. She said that she also had "heartburn" yesterday in her neckwhich she describes a burning pain as she has had no past. She said that she has not had any of the "heartburn" since she took Pepto-Bismol. Did not have any associated symptoms with heartburn such as shortness of breath. Denies any chest pain. Denies any shortness of breath at this time. Says that the abdominal pain is in the lower as well as lower left abdomen. No complaints of dysuria.   Past Medical History  Diagnosis Date  . Arthritis   . Asthma   . COPD (chronic obstructive pulmonary disease) ([name])   . Crohn's disease ([name])   . Pneumonia   . Hypertension   . Hyperlipidemia   . Myocardial infarction ([name])   . Peptic ulcer disease   . Osteopenia   . Depression   . Anemia     Patient Active Problem List   Diagnosis Date Noted  . CAP (community acquired pneumonia) 11/02/2014    Past Surgical History  Procedure Laterality Date  . Abdominal  surgery    . Small intestine surgery    . Shoulder surgery Right     x 2   . Cardiac catheterization    . Colonoscopy with propofol N/A 09/27/2014    Procedure: COLONOSCOPY WITH PROPOFOL;  Surgeon: [name], MD;  Location: [facility] ENDOSCOPY;  Service: Gastroenterology;  Laterality: N/A;    Current Outpatient Rx  Name  Route  Sig  Dispense  Refill  . acetaminophen (TYLENOL) 500 MG tablet   Oral   Take 500 mg by mouth every 6 (six) hours as needed.         . Adalimumab (HUMIRA) 40 MG/0.8ML PSKT      INJECT 40MG SUBCUTANEOUSLY EVERY 14 DAYS         . albuterol (PROVENTIL HFA;VENTOLIN HFA) 108 (90 BASE) MCG/ACT inhaler   Inhalation   Inhale 2 puffs into the lungs every 6 (six) hours as needed for wheezing or shortness of breath.         [name] amLODipine-benazepril (LOTREL) 5-20 MG per capsule   Oral   Take 1 capsule by mouth daily.          [name] aspirin EC 81 MG tablet   Oral   Take 81 mg by mouth daily.         [name] atorvastatin (LIPITOR) 80 MG tablet   Oral   Take 80 mg by mouth daily.         . benzonatate (TESSALON) 200 MG  capsule   Oral   Take 1 capsule by mouth 3 (three) times daily.         . Calcium Carb-Cholecalciferol (CALCIUM 500+D PO)   Oral   Take 1 tablet by mouth daily.         . Cholecalciferol (VITAMIN D-3) 1000 UNITS CAPS   Oral   Take 1 capsule by mouth daily.         . clopidogrel (PLAVIX) 75 MG tablet   Oral   Take 75 mg by mouth daily.         . Cobalamine Combinations (VITAMIN B12-FOLIC ACID) 093-235 MCG TABS   Oral   Take 1 tablet by mouth daily.         . Fluticasone-Salmeterol (ADVAIR HFA IN)   Inhalation   Inhale into the lungs 2 (two) times daily.         . hydrochlorothiazide (HYDRODIURIL) 25 MG tablet   Oral   Take 25 mg by mouth daily.          [name] HYDROcodone-homatropine (HYCODAN) 5-1.5 MG/5ML syrup   Oral   Take 5 mLs by mouth every 6 (six) hours as needed.         . metoprolol succinate (TOPROL-XL) 25 MG 24  hr tablet   Oral   Take 25 mg by mouth daily.         . Multiple Vitamin (MULTIVITAMIN) capsule   Oral   Take 1 capsule by mouth daily.         . nitroGLYCERIN (NITROSTAT) 0.4 MG SL tablet   Sublingual   Place 0.4 mg under the tongue every 5 (five) minutes as needed for chest pain.         . potassium chloride (K-DUR,KLOR-CON) 10 MEQ tablet   Oral   Take 10 mEq by mouth 2 (two) times daily.         . predniSONE (DELTASONE) 10 MG tablet      Take 4 tablets for 2 days, 3 tablets for 2 days, 2 tablets for 2 days, 1 tablet for 2 days         . senna (SENOKOT) 8.6 MG tablet   Oral   Take 1 tablet by mouth daily.         [name] levofloxacin (LEVAQUIN) 500 MG tablet   Oral   Take 1 tablet (500 mg total) by mouth daily.   7 tablet   0     Allergies Penicillins; Augmentin; Indocin; Lodine; Methotrexate derivatives; Gold; and Zantac  No family history on file.  Social History Social History  Substance Use Topics  . Smoking status: Former Research scientist (life sciences)  . Smokeless tobacco: None  . Alcohol Use: No    Review of Systems Constitutional: No fever/chills Eyes: No visual changes. ENT: No sore throat. Cardiovascular: Denies chest pain. Respiratory: Denies shortness of breath. Gastrointestinal: No constipation. Genitourinary: Negative for dysuria. Musculoskeletal: Negative for back pain. Skin: Negative for rash. Neurological: Negative for headaches, focal weakness or numbness.  10-point ROS otherwise negative.  ____________________________________________   PHYSICAL EXAM:  VITAL SIGNS: ED Triage Vitals  Enc Vitals Group     BP 02/15/15 1154 159/74 mmHg     Pulse Rate 02/15/15 1154 96     Resp 02/15/15 1154 20     Temp 02/15/15 1154 98.4 F (36.9 C)     Temp Source 02/15/15 1154 Oral     SpO2 02/15/15 1154 94 %     Weight 02/15/15 1154 177 lb (80.287 kg)  Height 02/15/15 1154 5' 7"  (1.702 m)     Head Cir --      Peak Flow --      Pain Score --      Pain  Loc --      Pain Edu? --      Excl. in [place]? --     Constitutional: Alert and oriented. Well appearing and in no acute distress. Eyes: Conjunctivae are normal. PERRL. EOMI. Head: Atraumatic. Nose: No congestion/rhinnorhea. Mouth/Throat: Mucous membranes are moist.  Oropharynx non-erythematous. Neck: No stridor.   Cardiovascular: Normal rate, regular rhythm. Grossly normal heart sounds.  Good peripheral circulation. Respiratory: Normal respiratory effort.  No retractions. Lungs CTAB. Gastrointestinal: Soft with mild tenderness to the suprapubic as well as left lower abdomen. No distention. No abdominal bruits. No CVA tenderness. Rectal exam with several small non-engorged hemorrhoids. Brown heme-negative stool. Musculoskeletal: No lower extremity tenderness nor edema.  No joint effusions. Neurologic:  Normal speech and language. No gross focal neurologic deficits are appreciated. No gait instability. Skin:  Skin is warm, dry and intact. No rash noted. Psychiatric: Mood and affect are normal. Speech and behavior are normal.  ____________________________________________   LABS (all labs ordered are listed, but only abnormal results are displayed)  Labs Reviewed  COMPREHENSIVE METABOLIC PANEL - Abnormal; Notable for the following:    Potassium 2.9 (*)    BUN 23 (*)    Calcium 8.2 (*)    All other components within normal limits  CBC - Abnormal; Notable for the following:    RDW 14.8 (*)    All other components within normal limits  TROPONIN I - Abnormal; Notable for the following:    Troponin I 0.04 (*)    All other components within normal limits  TROPONIN I - Abnormal; Notable for the following:    Troponin I 0.04 (*)    All other components within normal limits  LIPASE, BLOOD  MAGNESIUM  URINALYSIS COMPLETEWITH MICROSCOPIC ([facility] ONLY)   ____________________________________________  EKG  ED ECG REPORT I, Doran Stabler, the attending physician, personally viewed and  interpreted this ECG.   Date: 02/15/2015  EKG Time: 1345  Rate: 90  Rhythm: normal sinus rhythm  Axis: Normal axis  Intervals:none  ST&T Change: No ST elevation or depression. ST elevation read by the machine is likely due to baseline. T-wave inversion in aVL. No significant change from EKG on in September 2016. ____________________________________________  RADIOLOGY  No acute finding on the CAT scan of the abdomen and pelvis. ____________________________________________   PROCEDURES   ____________________________________________   INITIAL IMPRESSION / ASSESSMENT AND PLAN / ED COURSE  Pertinent labs & imaging results that were available during my care of the patient were reviewed by me and considered in my medical decision making (see chart for details).  ----------------------------------------- 5:15 PM on 02/15/2015 -----------------------------------------  Patient resting comfortably at this time. Denies any pain. He tolerated the by mouth contrast. I discussed her lab results with her as well as her son was at the bedside. She and her son are aware of the elevated troponin. It appears that she has a chronically elevated troponin and at this time the level that she is reading is actually lower than her baseline. With this in the unchanged EKG as well as no chest pain or shortness of breath she'll be discharged home. Likely GI illness. Patient doesn't a history of Crohn's are reassuring CAT scan. We'll restart her Humira. She says she was offered because of a pneumonia  which she recently had. Will follow Dr. Candace Cruise. Nose return for any worsening or concerning symptoms. Will be discharged home. Patient understands plan and is willing to comply.  ____________________________________________   FINAL CLINICAL IMPRESSION(S) / ED DIAGNOSES  Abdominal pain. Nausea vomiting and diarrhea.    Orbie Pyo, MD 02/15/15 [phone]  Patient did have recent course of antibiotics  for pneumonia. Did have one episode of diarrhea in the emergency department which she described as watery. I discussed with her the possibilities that this could be C. difficile. However, she says that she is already on some medications and does not want to add any further medications to her daily routine. I discussed with her a "wait-and-see" approach. I will give her a prescription for Flagyl and I said that if the symptoms are not improving over the next one is that she may begin it. She understands the plan as well as her son and is willing to comply.  Orbie Pyo, MD 02/15/15 [phone]

## 2015-02-15 NOTE — ED Notes (Signed)
Patient transported to CT 

## 2015-02-15 NOTE — ED Notes (Signed)
Brought over from [facility] with bloody stools since yesterday afternoon

## 2015-02-15 NOTE — ED Notes (Signed)
Vital signs stable. 

## 2015-02-15 NOTE — ED Notes (Addendum)
  Elevated Troponin received from lab: 0.04.  Dr. Clearnce Hasten notified. Repeat back and acknowledged.

## 2015-03-18 ENCOUNTER — Ambulatory Visit (INDEPENDENT_AMBULATORY_CARE_PROVIDER_SITE_OTHER): Payer: Medicare Other | Admitting: Podiatry

## 2015-03-18 ENCOUNTER — Encounter: Payer: Self-pay | Admitting: Podiatry

## 2015-03-18 ENCOUNTER — Ambulatory Visit: Payer: Medicare Other | Admitting: Podiatry

## 2015-03-18 DIAGNOSIS — Q828 Other specified congenital malformations of skin: Secondary | ICD-10-CM

## 2015-03-18 NOTE — Progress Notes (Signed)
She presents today with chief complaint of a painful corn to the fifth digit left foot. This been there for quite some time and every time we trim it for her it feels much better for months.  Objective: Vital signs are stable she is alert and oriented 3. Pulses are palpable area hallux abductovalgus deformity and tailor's bunion deformities together resulting in pressure against the fifth digit and irritation along the medial aspect of the distal interphalangeal joint fifth digit left foot. The wound is not open fluoroscopy. This is slightly hyperkeratotic lesion with an underlying exostosis.  Assessment porokeratosis hammertoe deformity toes bunion deformity and hallux valgus deformities.  Plan: Debridement reactive hyperkeratosis fifth digit left foot. Follow up with her as needed.

## 2015-04-19 ENCOUNTER — Inpatient Hospital Stay
Admission: EM | Admit: 2015-04-19 | Discharge: 2015-04-21 | DRG: 190 | Disposition: A | Discharge status: Home / Self Care | Payer: Medicare Other | Attending: Internal Medicine | Admitting: Internal Medicine

## 2015-04-19 ENCOUNTER — Encounter: Payer: Self-pay | Admitting: Internal Medicine

## 2015-04-19 ENCOUNTER — Emergency Department: Payer: Medicare Other

## 2015-04-19 DIAGNOSIS — Z79899 Other long term (current) drug therapy: Secondary | ICD-10-CM | POA: Diagnosis not present

## 2015-04-19 DIAGNOSIS — Z7951 Long term (current) use of inhaled steroids: Secondary | ICD-10-CM

## 2015-04-19 DIAGNOSIS — I10 Essential (primary) hypertension: Secondary | ICD-10-CM | POA: Diagnosis present

## 2015-04-19 DIAGNOSIS — M8580 Other specified disorders of bone density and structure, unspecified site: Secondary | ICD-10-CM | POA: Diagnosis present

## 2015-04-19 DIAGNOSIS — J45909 Unspecified asthma, uncomplicated: Secondary | ICD-10-CM | POA: Diagnosis present

## 2015-04-19 DIAGNOSIS — Z8711 Personal history of peptic ulcer disease: Secondary | ICD-10-CM | POA: Diagnosis not present

## 2015-04-19 DIAGNOSIS — Z87891 Personal history of nicotine dependence: Secondary | ICD-10-CM

## 2015-04-19 DIAGNOSIS — R0602 Shortness of breath: Secondary | ICD-10-CM

## 2015-04-19 DIAGNOSIS — J440 Chronic obstructive pulmonary disease with acute lower respiratory infection: Secondary | ICD-10-CM | POA: Diagnosis present

## 2015-04-19 DIAGNOSIS — K5090 Crohn's disease, unspecified, without complications: Secondary | ICD-10-CM | POA: Diagnosis present

## 2015-04-19 DIAGNOSIS — D649 Anemia, unspecified: Secondary | ICD-10-CM | POA: Diagnosis present

## 2015-04-19 DIAGNOSIS — J690 Pneumonitis due to inhalation of food and vomit: Secondary | ICD-10-CM | POA: Diagnosis present

## 2015-04-19 DIAGNOSIS — M1990 Unspecified osteoarthritis, unspecified site: Secondary | ICD-10-CM | POA: Diagnosis present

## 2015-04-19 DIAGNOSIS — Z880 Allergy status to penicillin: Secondary | ICD-10-CM | POA: Diagnosis not present

## 2015-04-19 DIAGNOSIS — I252 Old myocardial infarction: Secondary | ICD-10-CM | POA: Diagnosis not present

## 2015-04-19 DIAGNOSIS — F329 Major depressive disorder, single episode, unspecified: Secondary | ICD-10-CM | POA: Diagnosis present

## 2015-04-19 DIAGNOSIS — Z9981 Dependence on supplemental oxygen: Secondary | ICD-10-CM | POA: Diagnosis not present

## 2015-04-19 DIAGNOSIS — J189 Pneumonia, unspecified organism: Secondary | ICD-10-CM | POA: Diagnosis present

## 2015-04-19 DIAGNOSIS — Z8249 Family history of ischemic heart disease and other diseases of the circulatory system: Secondary | ICD-10-CM

## 2015-04-19 DIAGNOSIS — Z8701 Personal history of pneumonia (recurrent): Secondary | ICD-10-CM | POA: Diagnosis not present

## 2015-04-19 DIAGNOSIS — E785 Hyperlipidemia, unspecified: Secondary | ICD-10-CM | POA: Diagnosis present

## 2015-04-19 DIAGNOSIS — Z9889 Other specified postprocedural states: Secondary | ICD-10-CM

## 2015-04-19 DIAGNOSIS — I2510 Atherosclerotic heart disease of native coronary artery without angina pectoris: Secondary | ICD-10-CM | POA: Diagnosis present

## 2015-04-19 DIAGNOSIS — Z888 Allergy status to other drugs, medicaments and biological substances status: Secondary | ICD-10-CM

## 2015-04-19 DIAGNOSIS — Z7982 Long term (current) use of aspirin: Secondary | ICD-10-CM

## 2015-04-19 LAB — CBC WITH DIFFERENTIAL/PLATELET
Basophils Absolute: 0 10*3/uL (ref 0–0.1)
Basophils Relative: 0 %
Eosinophils Absolute: 0.1 10*3/uL (ref 0–0.7)
Eosinophils Relative: 1 %
HCT: 39.6 % (ref 35.0–47.0)
Hemoglobin: 12.6 g/dL (ref 12.0–16.0)
Lymphocytes Relative: 13 %
Lymphs Abs: 1.3 10*3/uL (ref 1.0–3.6)
MCH: 28.4 pg (ref 26.0–34.0)
MCHC: 31.7 g/dL — ABNORMAL LOW (ref 32.0–36.0)
MCV: 89.5 fL (ref 80.0–100.0)
Monocytes Absolute: 1.5 10*3/uL — ABNORMAL HIGH (ref 0.2–0.9)
Monocytes Relative: 15 %
Neutro Abs: 7.2 10*3/uL — ABNORMAL HIGH (ref 1.4–6.5)
Neutrophils Relative %: 71 %
Platelets: 203 10*3/uL (ref 150–440)
RBC: 4.43 MIL/uL (ref 3.80–5.20)
RDW: 15.5 % — ABNORMAL HIGH (ref 11.5–14.5)
WBC: 10 10*3/uL (ref 3.6–11.0)

## 2015-04-19 LAB — RAPID INFLUENZA A&B ANTIGENS
Influenza A (ARMC): NEGATIVE
Influenza B (ARMC): NEGATIVE

## 2015-04-19 LAB — LACTIC ACID, PLASMA: Lactic Acid, Venous: 1.3 mmol/L (ref 0.5–2.0)

## 2015-04-19 LAB — BASIC METABOLIC PANEL
Anion gap: 8 (ref 5–15)
BUN: 13 mg/dL (ref 6–20)
CO2: 28 mmol/L (ref 22–32)
Calcium: 8.1 mg/dL — ABNORMAL LOW (ref 8.9–10.3)
Chloride: 103 mmol/L (ref 101–111)
Creatinine, Ser: 0.9 mg/dL (ref 0.44–1.00)
GFR calc Af Amer: >60 mL/min (ref >60)
GFR calc non Af Amer: 60 mL/min — ABNORMAL LOW (ref >60)
Glucose, Bld: 107 mg/dL — ABNORMAL HIGH (ref 65–99)
Potassium: 3.4 mmol/L — ABNORMAL LOW (ref 3.5–5.1)
Sodium: 139 mmol/L (ref 135–145)

## 2015-04-19 LAB — BRAIN NATRIURETIC PEPTIDE: B Natriuretic Peptide: 209 pg/mL — ABNORMAL HIGH (ref 0.0–100.0)

## 2015-04-19 LAB — TROPONIN I: Troponin I: 0.06 ng/mL — ABNORMAL HIGH (ref <0.031)

## 2015-04-19 MED ORDER — DEXTROSE 5 % IV SOLN
1.0000 g | INTRAVENOUS | Status: DC
  Administered 2015-04-20: 1 g via INTRAVENOUS
  Filled 2015-04-19 (×2): qty 10

## 2015-04-19 MED ORDER — ATORVASTATIN CALCIUM 10 MG PO TABS
10.0000 mg | ORAL_TABLET | Freq: Every day | ORAL | Status: DC
  Administered 2015-04-19 – 2015-04-20 (×2): 10 mg via ORAL
  Filled 2015-04-19 (×2): qty 1

## 2015-04-19 MED ORDER — GUAIFENESIN ER 600 MG PO TB12
600.0000 mg | ORAL_TABLET | Freq: Two times a day (BID) | ORAL | Status: DC
  Administered 2015-04-19 – 2015-04-21 (×3): 600 mg via ORAL
  Filled 2015-04-19 (×3): qty 1

## 2015-04-19 MED ORDER — ACETAMINOPHEN 325 MG PO TABS
ORAL_TABLET | ORAL | Status: AC
  Administered 2015-04-19: 650 mg via ORAL
  Filled 2015-04-19: qty 2

## 2015-04-19 MED ORDER — SODIUM CHLORIDE 0.9 % IV BOLUS (SEPSIS)
1000.0000 mL | Freq: Once | INTRAVENOUS | Status: AC
  Administered 2015-04-19: 1000 mL via INTRAVENOUS

## 2015-04-19 MED ORDER — SODIUM CHLORIDE 0.9% FLUSH
3.0000 mL | Freq: Two times a day (BID) | INTRAVENOUS | Status: DC
  Administered 2015-04-19 – 2015-04-21 (×4): 3 mL via INTRAVENOUS

## 2015-04-19 MED ORDER — BENAZEPRIL HCL 20 MG PO TABS
20.0000 mg | ORAL_TABLET | Freq: Every day | ORAL | Status: DC
  Administered 2015-04-19 – 2015-04-21 (×3): 20 mg via ORAL
  Filled 2015-04-19 (×4): qty 1

## 2015-04-19 MED ORDER — AMLODIPINE BESY-BENAZEPRIL HCL 5-20 MG PO CAPS: 1.0000 | ORAL_CAPSULE | Freq: Every day | ORAL | Status: DC

## 2015-04-19 MED ORDER — ADULT MULTIVITAMIN W/MINERALS CH
1.0000 | ORAL_TABLET | Freq: Every day | ORAL | Status: DC
Start: 2015-04-19 — End: 2015-04-21
  Administered 2015-04-20: 1 via ORAL
  Filled 2015-04-19 (×2): qty 1

## 2015-04-19 MED ORDER — ONDANSETRON HCL 4 MG PO TABS: 4.0000 mg | ORAL_TABLET | Freq: Four times a day (QID) | ORAL | Status: DC | PRN

## 2015-04-19 MED ORDER — MOMETASONE FURO-FORMOTEROL FUM 100-5 MCG/ACT IN AERO
2.0000 | INHALATION_SPRAY | Freq: Two times a day (BID) | RESPIRATORY_TRACT | Status: DC
  Administered 2015-04-19 – 2015-04-21 (×4): 2 via RESPIRATORY_TRACT
  Filled 2015-04-19: qty 8.8

## 2015-04-19 MED ORDER — AZITHROMYCIN 500 MG IV SOLR
500.0000 mg | INTRAVENOUS | Status: DC
  Administered 2015-04-20: 18:00:00 500 mg via INTRAVENOUS
  Filled 2015-04-19 (×2): qty 500

## 2015-04-19 MED ORDER — METHYLPREDNISOLONE SODIUM SUCC 125 MG IJ SOLR
60.0000 mg | Freq: Four times a day (QID) | INTRAMUSCULAR | Status: DC
  Administered 2015-04-19 – 2015-04-20 (×3): 60 mg via INTRAVENOUS
  Filled 2015-04-19 (×3): qty 2

## 2015-04-19 MED ORDER — HYDROCHLOROTHIAZIDE 25 MG PO TABS
25.0000 mg | ORAL_TABLET | Freq: Every day | ORAL | Status: DC
  Administered 2015-04-20 – 2015-04-21 (×2): 25 mg via ORAL
  Filled 2015-04-19 (×2): qty 1

## 2015-04-19 MED ORDER — IPRATROPIUM-ALBUTEROL 0.5-2.5 (3) MG/3ML IN SOLN
3.0000 mL | Freq: Four times a day (QID) | RESPIRATORY_TRACT | Status: DC
Start: 2015-04-19 — End: 2015-04-20

## 2015-04-19 MED ORDER — CLOPIDOGREL BISULFATE 75 MG PO TABS
75.0000 mg | ORAL_TABLET | Freq: Every day | ORAL | Status: DC
  Administered 2015-04-20 – 2015-04-21 (×2): 75 mg via ORAL
  Filled 2015-04-19 (×2): qty 1

## 2015-04-19 MED ORDER — ASPIRIN EC 81 MG PO TBEC
81.0000 mg | DELAYED_RELEASE_TABLET | Freq: Every day | ORAL | Status: DC
  Administered 2015-04-20 – 2015-04-21 (×2): 81 mg via ORAL
  Filled 2015-04-19 (×2): qty 1

## 2015-04-19 MED ORDER — LEVOFLOXACIN IN D5W 750 MG/150ML IV SOLN: 750.0000 mg | Freq: Once | INTRAVENOUS | Status: DC

## 2015-04-19 MED ORDER — ACETAMINOPHEN 325 MG PO TABS
650.0000 mg | ORAL_TABLET | Freq: Once | ORAL | Status: AC
  Administered 2015-04-19: 650 mg via ORAL

## 2015-04-19 MED ORDER — HEPARIN SODIUM (PORCINE) 5000 UNIT/ML IJ SOLN
5000.0000 [IU] | Freq: Three times a day (TID) | INTRAMUSCULAR | Status: DC
  Administered 2015-04-19 – 2015-04-20 (×4): 5000 [IU] via SUBCUTANEOUS
  Filled 2015-04-19 (×5): qty 1

## 2015-04-19 MED ORDER — NITROGLYCERIN 0.4 MG SL SUBL: 0.4000 mg | SUBLINGUAL_TABLET | SUBLINGUAL | Status: DC | PRN

## 2015-04-19 MED ORDER — METOPROLOL SUCCINATE ER 25 MG PO TB24
25.0000 mg | ORAL_TABLET | Freq: Every day | ORAL | Status: DC
  Administered 2015-04-20 – 2015-04-21 (×2): 25 mg via ORAL
  Filled 2015-04-19 (×2): qty 1

## 2015-04-19 MED ORDER — ALBUTEROL SULFATE (2.5 MG/3ML) 0.083% IN NEBU: 3.0000 mL | INHALATION_SOLUTION | Freq: Four times a day (QID) | RESPIRATORY_TRACT | Status: DC | PRN

## 2015-04-19 MED ORDER — ACETAMINOPHEN 325 MG PO TABS: 650.0000 mg | ORAL_TABLET | Freq: Four times a day (QID) | ORAL | Status: DC | PRN

## 2015-04-19 MED ORDER — DEXTROSE 5 % IV SOLN
1.0000 g | INTRAVENOUS | Status: DC
Start: 2015-04-19 — End: 2015-04-19
  Administered 2015-04-19: 1 g via INTRAVENOUS
  Filled 2015-04-19: qty 10

## 2015-04-19 MED ORDER — CALCIUM CARBONATE-VITAMIN D 500-200 MG-UNIT PO TABS
1.0000 | ORAL_TABLET | Freq: Every day | ORAL | Status: DC
  Administered 2015-04-20 – 2015-04-21 (×2): 1 via ORAL
  Filled 2015-04-19 (×2): qty 1

## 2015-04-19 MED ORDER — AMLODIPINE BESYLATE 5 MG PO TABS
5.0000 mg | ORAL_TABLET | Freq: Every day | ORAL | Status: DC
  Administered 2015-04-19 – 2015-04-21 (×3): 5 mg via ORAL
  Filled 2015-04-19 (×3): qty 1

## 2015-04-19 MED ORDER — SODIUM CHLORIDE 0.9 % IV SOLN
250.0000 mL | INTRAVENOUS | Status: DC | PRN
Start: 2015-04-19 — End: 2015-04-21

## 2015-04-19 MED ORDER — DEXTROSE 5 % IV SOLN
500.0000 mg | Freq: Once | INTRAVENOUS | Status: AC
  Administered 2015-04-19: 500 mg via INTRAVENOUS
  Filled 2015-04-19: qty 500

## 2015-04-19 MED ORDER — PREDNISONE 1 MG PO TABS
2.0000 mg | ORAL_TABLET | Freq: Every day | ORAL | Status: DC
  Administered 2015-04-20: 2 mg via ORAL
  Filled 2015-04-19 (×3): qty 2

## 2015-04-19 MED ORDER — ACETAMINOPHEN 500 MG PO TABS: 500.0000 mg | ORAL_TABLET | Freq: Four times a day (QID) | ORAL | Status: DC | PRN

## 2015-04-19 MED ORDER — ONDANSETRON HCL 4 MG/2ML IJ SOLN: 4.0000 mg | Freq: Four times a day (QID) | INTRAMUSCULAR | Status: DC | PRN

## 2015-04-19 MED ORDER — ACETAMINOPHEN 650 MG RE SUPP: 650.0000 mg | Freq: Four times a day (QID) | RECTAL | Status: DC | PRN

## 2015-04-19 MED ORDER — SENNA 8.6 MG PO TABS: 1.0000 | ORAL_TABLET | Freq: Every evening | ORAL | Status: DC | PRN

## 2015-04-19 MED ORDER — ONDANSETRON HCL 4 MG PO TABS: 4.0000 mg | ORAL_TABLET | Freq: Every day | ORAL | Status: DC | PRN

## 2015-04-19 MED ORDER — ADALIMUMAB 40 MG/0.8ML ~~LOC~~ PSKT: 40.0000 mg | PREFILLED_SYRINGE | SUBCUTANEOUS | Status: DC

## 2015-04-19 MED ORDER — SODIUM CHLORIDE 0.9% FLUSH
3.0000 mL | INTRAVENOUS | Status: DC | PRN
  Administered 2015-04-19 – 2015-04-20 (×2): 3 mL via INTRAVENOUS
  Filled 2015-04-19 (×2): qty 3

## 2015-04-19 MED ORDER — VITAMIN D 1000 UNITS PO TABS
1000.0000 [IU] | ORAL_TABLET | Freq: Every day | ORAL | Status: DC
  Administered 2015-04-20 – 2015-04-21 (×2): 1000 [IU] via ORAL
  Filled 2015-04-19 (×3): qty 1

## 2015-04-19 MED ORDER — POTASSIUM CHLORIDE CRYS ER 10 MEQ PO TBCR
10.0000 meq | EXTENDED_RELEASE_TABLET | Freq: Two times a day (BID) | ORAL | Status: DC
  Administered 2015-04-19 – 2015-04-21 (×4): 10 meq via ORAL
  Filled 2015-04-19 (×4): qty 1

## 2015-04-19 NOTE — ED Notes (Signed)
Patient c/o cough and generalized weakness that started 2 days ago, yesterday she started to feel worse. Patient has not taken her temperature at home.

## 2015-04-19 NOTE — ED Provider Notes (Signed)
[facility] Emergency Department Provider Note  ____________________________________________   I have reviewed the triage vital signs and the nursing notes.   HISTORY  Chief Complaint Cough; Nasal Congestion; and Fever    HPI Raven [name] is a 81 y.o. female  w hx of O2 at night, copd, pna, asthma presents today complaining of cough and  'Laying around all day.'  Decreased energy and increased sob. Has had a cough for the last few days, + productive. + fever.  States she feels lethargic.  No chest pain. No leg swelling. Had fever here in waiting room. Fever is without antipyretics. Patient states she has had no dysuria no urinary frequency. She denies abdominal pain or vomiting.  Past Medical History  Diagnosis Date  . Arthritis   . Asthma   . COPD (chronic obstructive pulmonary disease) ([place])   . Crohn's disease ([place])   . Pneumonia   . Hypertension   . Hyperlipidemia   . Myocardial infarction ([place])   . Peptic ulcer disease   . Osteopenia   . Depression   . Anemia     Patient Active Problem List   Diagnosis Date Noted  . CAP (community acquired pneumonia) 11/02/2014    Past Surgical History  Procedure Laterality Date  . Abdominal surgery    . Small intestine surgery    . Shoulder surgery Right     x 2   . Cardiac catheterization    . Colonoscopy with propofol N/A 09/27/2014    Procedure: COLONOSCOPY WITH PROPOFOL;  Surgeon: [name], MD;  Location: [facility] ENDOSCOPY;  Service: Gastroenterology;  Laterality: N/A;    Current Outpatient Rx  Name  Route  Sig  Dispense  Refill  . acetaminophen (TYLENOL) 500 MG tablet   Oral   Take 500 mg by mouth every 6 (six) hours as needed.         . Adalimumab (HUMIRA) 40 MG/0.8ML PSKT      INJECT 40MG SUBCUTANEOUSLY EVERY 14 DAYS         . albuterol (PROVENTIL HFA;VENTOLIN HFA) 108 (90 BASE) MCG/ACT inhaler   Inhalation   Inhale 2 puffs into the lungs every 6 (six) hours as needed for wheezing or  shortness of breath.         [name] amLODipine-benazepril (LOTREL) 5-20 MG per capsule   Oral   Take 1 capsule by mouth daily.          [name] aspirin EC 81 MG tablet   Oral   Take 81 mg by mouth daily.         [name] atorvastatin (LIPITOR) 80 MG tablet   Oral   Take 80 mg by mouth daily.         . benzonatate (TESSALON) 200 MG capsule   Oral   Take 1 capsule by mouth 3 (three) times daily.         . Calcium Carb-Cholecalciferol (CALCIUM 500+D PO)   Oral   Take 1 tablet by mouth daily.         . Cholecalciferol (VITAMIN D-3) 1000 UNITS CAPS   Oral   Take 1 capsule by mouth daily.         . clopidogrel (PLAVIX) 75 MG tablet   Oral   Take 75 mg by mouth daily.         . Cobalamine Combinations (VITAMIN B12-FOLIC ACID) 244-010 MCG TABS   Oral   Take 1 tablet by mouth daily.         [name]  dicyclomine (BENTYL) 20 MG tablet   Oral   Take 1 tablet (20 mg total) by mouth 3 (three) times daily as needed for spasms.   15 tablet   0   . Fluticasone-Salmeterol (ADVAIR HFA IN)   Inhalation   Inhale into the lungs 2 (two) times daily.         . hydrochlorothiazide (HYDRODIURIL) 25 MG tablet   Oral   Take 25 mg by mouth daily.          [name] levofloxacin (LEVAQUIN) 500 MG tablet   Oral   Take 1 tablet (500 mg total) by mouth daily.   7 tablet   0   . metoprolol succinate (TOPROL-XL) 25 MG 24 hr tablet   Oral   Take 25 mg by mouth daily.         . metroNIDAZOLE (FLAGYL) 500 MG tablet   Oral   Take 1 tablet (500 mg total) by mouth 3 (three) times daily. Fill this prescription if no improvement in diarrhea over the next 24-48 hours   30 tablet   0   . Multiple Vitamin (MULTIVITAMIN) capsule   Oral   Take 1 capsule by mouth daily.         . nitroGLYCERIN (NITROSTAT) 0.4 MG SL tablet   Sublingual   Place 0.4 mg under the tongue every 5 (five) minutes as needed for chest pain.         [name] ondansetron (ZOFRAN) 4 MG tablet   Oral   Take 1 tablet (4 mg total) by  mouth daily as needed for nausea or vomiting.   10 tablet   0   . potassium chloride (K-DUR,KLOR-CON) 10 MEQ tablet   Oral   Take 10 mEq by mouth 2 (two) times daily.         . predniSONE (DELTASONE) 10 MG tablet      Take 4 tablets for 2 days, 3 tablets for 2 days, 2 tablets for 2 days, 1 tablet for 2 days         . senna (SENOKOT) 8.6 MG tablet   Oral   Take 1 tablet by mouth daily.           Allergies Penicillins; Augmentin; Indocin; Lodine; Methotrexate derivatives; Gold; and Zantac  No family history on file.  Social History Social History  Substance Use Topics  . Smoking status: Former Research scientist (life sciences)  . Smokeless tobacco: Not on file  . Alcohol Use: No    Review of Systems Constitutional: No fever until in the waiting room Eyes: No visual changes. ENT: Positive mild sore throat. No stiff neck no neck pain positive rhinorrhea Cardiovascular: Denies chest pain. Respiratory: Positive cough and shortness of breath. Gastrointestinal:   no vomiting.  No diarrhea.  No constipation. Genitourinary: Negative for dysuria. Musculoskeletal: Negative lower extremity swelling Skin: Negative for rash. Neurological: Negative for headaches, focal weakness or numbness. 10-point ROS otherwise negative.  ____________________________________________   PHYSICAL EXAM:  VITAL SIGNS: ED Triage Vitals  Enc Vitals Group     BP 04/19/15 1454 114/95 mmHg     Pulse Rate 04/19/15 1454 105     Resp 04/19/15 1454 19     Temp 04/19/15 1454 103.3 F (39.6 C)     Temp Source 04/19/15 1454 Oral     SpO2 04/19/15 1454 89 %     Weight 04/19/15 1454 175 lb (79.379 kg)     Height 04/19/15 1454 5' 7"  (1.702 m)     Head Cir --  Peak Flow --      Pain Score 04/19/15 1455 0     Pain Loc --      Pain Edu? --      Excl. in [place]? --     Constitutional: Alert and oriented. Well appearing and in no acute distress. Eyes: Conjunctivae are normal. PERRL. EOMI. Head: Atraumatic. Nose: No  congestion/rhinnorhea. Mouth/Throat: Mucous membranes are moist.  Oropharynx non-erythematous. Neck: No stridor.   Nontender with no meningismus Cardiovascular: Normal rate, regular rhythm. Grossly normal heart sounds.  Good peripheral circulation. Respiratory: Normal respiratory effort.  No retractions.  Positive occasional rhonchi on the right side no respiratory distress noted Abdominal: Soft and nontender. No distention. No guarding no rebound Back:  There is no focal tenderness or step off there is no midline tenderness there are no lesions noted. there is no CVA tenderness Musculoskeletal: No lower extremity tenderness. No joint effusions, no DVT signs strong distal pulses no edema Neurologic:  Normal speech and language. No gross focal neurologic deficits are appreciated.  Skin:  Skin is warm, dry and intact. No rash noted. Psychiatric: Mood and affect are normal. Speech and behavior are normal.  ____________________________________________   LABS (all labs ordered are listed, but only abnormal results are displayed)  Labs Reviewed  CULTURE, BLOOD (ROUTINE X 2)  CULTURE, BLOOD (ROUTINE X 2)  CBC WITH DIFFERENTIAL/PLATELET  TROPONIN I  BRAIN NATRIURETIC PEPTIDE  BASIC METABOLIC PANEL  LACTIC ACID, PLASMA  LACTIC ACID, PLASMA   ____________________________________________  EKG  I personally interpreted any EKGs ordered by me or triage Normal sinus rhythm at 90 bpm, partial right bundle branch block, no acute ST elevation or depression, QTC 477. Old anterior infarct ____________________________________________  RADIOLOGY  I reviewed any imaging ordered by me or triage that were performed during my shift ____________________________________________   PROCEDURES  Procedure(s) performed: None  Critical Care performed: None  ____________________________________________   INITIAL IMPRESSION / ASSESSMENT AND PLAN / ED COURSE  Pertinent labs & imaging results that  were available during my care of the patient were reviewed by me and considered in my medical decision making (see chart for details).  Patient with cough and lethargy at home. She is nontoxic in appearance here. We will give her IV fluids however and given the fact that she has a pneumonia and is on home oxygen when necessary with low sats on 2 L we will admit her to the hospital. ____________________________________________   FINAL CLINICAL IMPRESSION(S) / ED DIAGNOSES  Final diagnoses:  None      This chart was dictated using voice recognition software.  Despite best efforts to proofread,  errors can occur which can change meaning.     Schuyler Amor, MD 04/19/15 [phone]

## 2015-04-19 NOTE — H&P (Signed)
[place] [facility] [place] NAME: Raven [name]    MR#:  [id number]  DATE OF BIRTH:  [birth year]  DATE OF ADMISSION:  04/19/2015  PRIMARY CARE PHYSICIAN: [name], MD   REQUESTING/REFERRING PHYSICIAN: Dr. [name]  CHIEF COMPLAINT:   Chief Complaint  Patient presents with  . Cough  . Nasal Congestion  . Fever    HISTORY OF PRESENT ILLNESS: Raven [name]  is a 81 y.o. female with a known history of COPD on chronic oxygen at nighttime, hypertension, hyperlipidemia, coronary artery disease presents to the emergency complaining of shortness of breath and cough ongoing for the past few days. Patient also has had fevers at home. She came to the ED and was noted to have pneumonia were asked to admit the patient. She is also complains of some wheezing. Her cough is yellow in color. PAST MEDICAL HISTORY:   Past Medical History  Diagnosis Date  . Arthritis   . Asthma   . COPD (chronic obstructive pulmonary disease) ([place])   . Crohn's disease ([place])   . Pneumonia   . Hypertension   . Hyperlipidemia   . Myocardial infarction ([place])   . Peptic ulcer disease   . Osteopenia   . Depression   . Anemia     PAST SURGICAL HISTORY:  Past Surgical History  Procedure Laterality Date  . Abdominal surgery    . Small intestine surgery    . Shoulder surgery Right     x 2   . Cardiac catheterization    . Colonoscopy with propofol N/A 09/27/2014    Procedure: COLONOSCOPY WITH PROPOFOL;  Surgeon: [name], MD;  Location: [facility] ENDOSCOPY;  Service: Gastroenterology;  Laterality: N/A;    SOCIAL HISTORY:  Social History  Substance Use Topics  . Smoking status: Former Research scientist (life sciences)  . Smokeless tobacco: Not on file  . Alcohol Use: No    FAMILY HISTORY: HTN DRUG ALLERGIES:  Allergies  Allergen Reactions  . Penicillins Other (See Comments)    Reaction:  Unknown   . Augmentin [Amoxicillin-Pot Clavulanate] Other (See Comments)    Reaction:  Unknown    . Indocin [Indomethacin] Other (See Comments)    Reaction:  Unknown   . Lodine [Etodolac] Other (See Comments)    Reaction:   GI upset   . Methotrexate Derivatives Other (See Comments)    Reaction:  GI upset   . Gold Rash  . Zantac [Ranitidine Hcl] Rash    REVIEW OF SYSTEMS:   CONSTITUTIONAL: No fever, fatigue or weakness.  EYES: No blurred or double vision.  EARS, NOSE, AND THROAT: No tinnitus or ear pain.  RESPIRATORY: Productive cough, positive shortness of breath and wheezing or no hemoptysis.  CARDIOVASCULAR: No chest pain, orthopnea, edema.  GASTROINTESTINAL: No nausea, vomiting, diarrhea or abdominal pain.  GENITOURINARY: No dysuria, hematuria.  ENDOCRINE: No polyuria, nocturia,  HEMATOLOGY: No anemia, easy bruising or bleeding SKIN: No rash or lesion. MUSCULOSKELETAL: No joint pain or arthritis.   NEUROLOGIC: No tingling, numbness, weakness.  PSYCHIATRY: No anxiety or depression.   MEDICATIONS AT HOME:  Prior to Admission medications   Medication Sig Start Date End Date Taking? Authorizing Provider  acetaminophen (TYLENOL) 500 MG tablet Take 500-1,000 mg by mouth every 6 (six) hours as needed for mild pain, fever or headache.    Yes Historical Provider, MD  Adalimumab (HUMIRA) 40 MG/0.8ML PSKT Inject 40 mg into the skin every 14 (fourteen) days.   Yes Historical Provider,  MD  albuterol (PROVENTIL HFA;VENTOLIN HFA) 108 (90 BASE) MCG/ACT inhaler Inhale 2 puffs into the lungs every 6 (six) hours as needed for wheezing or shortness of breath.   Yes Historical Provider, MD  amLODipine-benazepril (LOTREL) 5-20 MG per capsule Take 1 capsule by mouth daily.    Yes Historical Provider, MD  aspirin EC 81 MG tablet Take 81 mg by mouth daily.   Yes Historical Provider, MD  atorvastatin (LIPITOR) 10 MG tablet Take 10 mg by mouth at bedtime.   Yes Historical Provider, MD  calcium-vitamin D (OSCAL WITH D) 500-200 MG-UNIT tablet Take 1 tablet by mouth daily.   Yes Historical Provider, MD   cholecalciferol (VITAMIN D) 1000 units tablet Take 1,000 Units by mouth daily.   Yes Historical Provider, MD  clopidogrel (PLAVIX) 75 MG tablet Take 75 mg by mouth daily.   Yes Historical Provider, MD  Fluticasone-Salmeterol (ADVAIR) 250-50 MCG/DOSE AEPB Inhale 1 puff into the lungs 2 (two) times daily.   Yes Historical Provider, MD  hydrochlorothiazide (HYDRODIURIL) 25 MG tablet Take 25 mg by mouth daily.    Yes Historical Provider, MD  metoprolol succinate (TOPROL-XL) 25 MG 24 hr tablet Take 25 mg by mouth daily.   Yes Historical Provider, MD  Multiple Vitamin (MULTIVITAMIN WITH MINERALS) TABS tablet Take 1 tablet by mouth daily.   Yes Historical Provider, MD  nitroGLYCERIN (NITROSTAT) 0.4 MG SL tablet Place 0.4 mg under the tongue every 5 (five) minutes as needed for chest pain.   Yes Historical Provider, MD  ondansetron (ZOFRAN) 4 MG tablet Take 1 tablet (4 mg total) by mouth daily as needed for nausea or vomiting. 02/15/15  Yes Orbie Pyo, MD  potassium chloride (K-DUR,KLOR-CON) 10 MEQ tablet Take 10 mEq by mouth 2 (two) times daily.   Yes Historical Provider, MD  predniSONE (DELTASONE) 1 MG tablet Take 2 mg by mouth daily with supper.   Yes Historical Provider, MD  predniSONE (DELTASONE) 5 MG tablet Take 5 mg by mouth daily with breakfast.   Yes Historical Provider, MD  senna (SENOKOT) 8.6 MG tablet Take 1 tablet by mouth at bedtime as needed for constipation.    Yes Historical Provider, MD      PHYSICAL EXAMINATION:   VITAL SIGNS: Blood pressure 157/73, pulse 94, temperature 101.5 F (38.6 C), temperature source Oral, resp. rate 24, height 5' 7"  (1.702 m), weight 79.379 kg (175 lb), SpO2 99 %.  GENERAL:  81 y.o.-year-old patient lying in the bed with no acute distress.  EYES: Pupils equal, round, reactive to light and accommodation. No scleral icterus. Extraocular muscles intact.  HEENT: Head atraumatic, normocephalic. Oropharynx and nasopharynx clear.  NECK:  Supple, no  jugular venous distention. No thyroid enlargement, no tenderness.  LUNGS: Normal breath sounds bilaterally, positive wheezing, rales,rhonchi or crepitation. No use of accessory muscles of usage CARDIOVASCULAR: S1, S2 normal. No murmurs, rubs, or gallops.  ABDOMEN: Soft, nontender, nondistended. Bowel sounds present. No organomegaly or mass.  EXTREMITIES: No pedal edema, cyanosis, or clubbing.  NEUROLOGIC: Cranial nerves II through XII are intact. Muscle strength 5/5 in all extremities. Sensation intact. Gait not checked.  PSYCHIATRIC: The patient is alert and oriented x 3.  SKIN: No obvious rash, lesion, or ulcer.   LABORATORY PANEL:   CBC  Recent Labs Lab 04/19/15 1646  WBC 10.0  HGB 12.6  HCT 39.6  PLT 203  MCV 89.5  MCH 28.4  MCHC 31.7*  RDW 15.5*  LYMPHSABS 1.3  MONOABS 1.5*  EOSABS 0.1  BASOSABS 0.0   ------------------------------------------------------------------------------------------------------------------  Chemistries  No results for input(s): NA, K, CL, CO2, GLUCOSE, BUN, CREATININE, CALCIUM, MG, AST, ALT, ALKPHOS, BILITOT in the last 168 hours.  Invalid input(s): GFRCGP ------------------------------------------------------------------------------------------------------------------ CrCl cannot be calculated (Patient has no serum creatinine result on file.). ------------------------------------------------------------------------------------------------------------------ No results for input(s): TSH, T4TOTAL, T3FREE, THYROIDAB in the last 72 hours.  Invalid input(s): FREET3   Coagulation profile No results for input(s): INR, PROTIME in the last 168 hours. ------------------------------------------------------------------------------------------------------------------- No results for input(s): DDIMER in the last 72 hours. -------------------------------------------------------------------------------------------------------------------  Cardiac  Enzymes No results for input(s): CKMB, TROPONINI, MYOGLOBIN in the last 168 hours.  Invalid input(s): CK ------------------------------------------------------------------------------------------------------------------ Invalid input(s): POCBNP  ---------------------------------------------------------------------------------------------------------------  Urinalysis    Component Value Date/Time   COLORURINE YELLOW* 11/02/2014 1141   COLORURINE Yellow 06/08/2014 2052   APPEARANCEUR CLEAR* 11/02/2014 1141   APPEARANCEUR Clear 06/08/2014 2052   LABSPEC 1.018 11/02/2014 1141   LABSPEC 1.015 06/08/2014 2052   PHURINE 5.0 11/02/2014 1141   PHURINE 5.0 06/08/2014 2052   GLUCOSEU NEGATIVE 11/02/2014 1141   GLUCOSEU Negative 06/08/2014 2052   HGBUR NEGATIVE 11/02/2014 1141   HGBUR Negative 06/08/2014 2052   BILIRUBINUR NEGATIVE 11/02/2014 1141   BILIRUBINUR Negative 06/08/2014 2052   KETONESUR TRACE* 11/02/2014 1141   KETONESUR 2+ 06/08/2014 2052   PROTEINUR NEGATIVE 11/02/2014 1141   PROTEINUR 30 mg/dL 06/08/2014 2052   NITRITE NEGATIVE 11/02/2014 1141   NITRITE Negative 06/08/2014 2052   LEUKOCYTESUR TRACE* 11/02/2014 1141   LEUKOCYTESUR Negative 06/08/2014 2052     RADIOLOGY: Dg Chest 2 View  04/19/2015  CLINICAL DATA:  Shortness of breath and fever for 2 days. History of recurrent pneumonia. EXAM: CHEST  2 VIEW COMPARISON:  PA and lateral chest 11/06/2014.  CT chest 07/26/2013. FINDINGS: The lungs are emphysematous. The patient has a moderate right pleural effusion and right basilar airspace disease. Patchy airspace disease is seen throughout the remainder of the right chest. Mild left basilar atelectasis is noted. Heart size is normal. IMPRESSION: Findings most consistent with pneumonia in the right chest appearing worst in the lower lobe of. Associated moderate right pleural effusion is noted. Emphysema. Electronically Signed   By: Inge Rise M.D.   On: 04/19/2015 15:33     EKG: Orders placed or performed during the hospital encounter of 04/19/15  . ED EKG  . ED EKG  . ED EKG  . ED EKG    IMPRESSION AND PLAN: Patient is a 81 year old white female presenting with cough fever shortness of breath  1. Shortness of breath and cough due to pneumonia at this time will treat with ceftriaxone and azithromycin. Obtain sputum cultures  2. Acute on chronic COPD exasperation will treat with nebulizers I will add low-dose steroids to her current regimen  3. Hyperlipidemia continue Lipitor  4. Hypertension continue metoprolol and Lotrel  5. Cad- plavix  5. Misc: lovenox for dvt proph    All the records are reviewed and case discussed with ED provider. Management plans discussed with the patient, family and they are in agreement.  CODE STATUS:full Code Status History    Date Active Date Inactive Code Status Order ID Comments User Context   11/02/2014 12:02 PM 11/08/2014  5:41 PM Full Code [id number]  Bettey Costa, MD Inpatient       TOTAL TIME TAKING CARE OF THIS PATIENT: 55 minutes.    Dustin Flock M.D on 04/19/2015 at 6:38 PM  Between 7am to 6pm - Pager - [phone]  After 6pm go to [email or web address] -  password [email or web address] [facility]  [facility]  Office  [phone]  CC: Primary care physician; [name], MD

## 2015-04-19 NOTE — ED Notes (Signed)
Pt arrives from home with her son  Pt with c/o cough, congestion and fever  103.3 upon arrival here  Pt wears chronic oxygen 2L    90% on RA

## 2015-04-20 LAB — CBC
HCT: 36.1 % (ref 35.0–47.0)
Hemoglobin: 11.8 g/dL — ABNORMAL LOW (ref 12.0–16.0)
MCH: 29 pg (ref 26.0–34.0)
MCHC: 32.8 g/dL (ref 32.0–36.0)
MCV: 88.3 fL (ref 80.0–100.0)
Platelets: 220 10*3/uL (ref 150–440)
RBC: 4.08 MIL/uL (ref 3.80–5.20)
RDW: 15 % — ABNORMAL HIGH (ref 11.5–14.5)
WBC: 9 10*3/uL (ref 3.6–11.0)

## 2015-04-20 LAB — BASIC METABOLIC PANEL
Anion gap: 13 (ref 5–15)
BUN: 16 mg/dL (ref 6–20)
CO2: 24 mmol/L (ref 22–32)
Calcium: 8.5 mg/dL — ABNORMAL LOW (ref 8.9–10.3)
Chloride: 103 mmol/L (ref 101–111)
Creatinine, Ser: 0.74 mg/dL (ref 0.44–1.00)
GFR calc Af Amer: >60 mL/min (ref >60)
GFR calc non Af Amer: >60 mL/min (ref >60)
Glucose, Bld: 187 mg/dL — ABNORMAL HIGH (ref 65–99)
Potassium: 3.3 mmol/L — ABNORMAL LOW (ref 3.5–5.1)
Sodium: 140 mmol/L (ref 135–145)

## 2015-04-20 LAB — URINALYSIS COMPLETE WITH MICROSCOPIC (ARMC ONLY)
Bacteria, UA: NONE SEEN
Bilirubin Urine: NEGATIVE
Glucose, UA: NEGATIVE mg/dL
Hgb urine dipstick: NEGATIVE
Leukocytes, UA: NEGATIVE
Nitrite: NEGATIVE
Protein, ur: NEGATIVE mg/dL
Specific Gravity, Urine: 1.006 (ref 1.005–1.030)
Squamous Epithelial / LPF: NONE SEEN
pH: 6 (ref 5.0–8.0)

## 2015-04-20 LAB — INFLUENZA PANEL BY PCR (TYPE A & B)
H1N1 flu by pcr: NOT DETECTED
Influenza A By PCR: NEGATIVE
Influenza B By PCR: NEGATIVE

## 2015-04-20 MED ORDER — CETYLPYRIDINIUM CHLORIDE 0.05 % MT LIQD
7.0000 mL | Freq: Two times a day (BID) | OROMUCOSAL | Status: DC
  Administered 2015-04-20 – 2015-04-21 (×2): 7 mL via OROMUCOSAL

## 2015-04-20 MED ORDER — PREDNISONE 10 MG PO TABS
5.0000 mg | ORAL_TABLET | Freq: Every day | ORAL | Status: DC
  Administered 2015-04-21: 08:00:00 5 mg via ORAL
  Filled 2015-04-20: qty 1

## 2015-04-20 MED ORDER — POTASSIUM CHLORIDE CRYS ER 20 MEQ PO TBCR
40.0000 meq | EXTENDED_RELEASE_TABLET | Freq: Once | ORAL | Status: AC
  Administered 2015-04-20: 40 meq via ORAL
  Filled 2015-04-20: qty 2

## 2015-04-20 MED ORDER — POTASSIUM CHLORIDE CRYS ER 20 MEQ PO TBCR: 40.0000 meq | EXTENDED_RELEASE_TABLET | Freq: Once | ORAL | Status: DC

## 2015-04-20 NOTE — Progress Notes (Signed)
[place] at [facility]                                                                                                                                                                                            Patient Demographics   Raven [name], is a 81 y.o. female, DOB - 02-01-1935, IRS:[id number]  Admit date - 04/19/2015   Admitting Physician Dustin Flock, MD  Outpatient Primary MD for the patient is Sarina Ser, Hewitt Blade, MD   LOS - 1  Subjective: Patient feeling better shortness of breath and cough improved. Wheezing resolved.     Review of Systems:   CONSTITUTIONAL: No documented fever. No fatigue, weakness. No weight gain, no weight loss.  EYES: No blurry or double vision.  ENT: No tinnitus. No postnasal drip. No redness of the oropharynx.  RESPIRATORY: No cough, no wheeze, no hemoptysis. No dyspnea.  CARDIOVASCULAR: No chest pain. No orthopnea. No palpitations. No syncope.  GASTROINTESTINAL: No nausea, no vomiting or diarrhea. No abdominal pain. No melena or hematochezia.  GENITOURINARY: No dysuria or hematuria.  ENDOCRINE: No polyuria or nocturia. No heat or cold intolerance.  HEMATOLOGY: No anemia. No bruising. No bleeding.  INTEGUMENTARY: No rashes. No lesions.  MUSCULOSKELETAL: No arthritis. No swelling. No gout.  NEUROLOGIC: No numbness, tingling, or ataxia. No seizure-type activity.  PSYCHIATRIC: No anxiety. No insomnia. No ADD.    Vitals:   Filed Vitals:   04/19/15 2029 04/19/15 2030 04/20/15 0452 04/20/15 0808  BP:  144/56 139/58 147/64  Pulse:  80 81 91  Temp:  98 F (36.7 C) 97.7 F (36.5 C)   TempSrc:  Oral Oral   Resp:  20 18 18   Height: 5' 7"  (1.702 m)     Weight: 83.734 kg (184 lb 9.6 oz)     SpO2:  92% 95% 95%    Wt Readings from Last 3 Encounters:  04/19/15 83.734 kg (184 lb 9.6 oz)  02/15/15 80.287 kg (177 lb)  11/08/14 80.287 kg (177 lb)     Intake/Output Summary (Last 24 hours) at 04/20/15  1124 Last data filed at 04/20/15 0900  Gross per 24 hour  Intake    240 ml  Output   1000 ml  Net   -760 ml    Physical Exam:   GENERAL: Pleasant-appearing in no apparent distress.  HEAD, EYES, EARS, NOSE AND THROAT: Atraumatic, normocephalic. Extraocular muscles are intact. Pupils equal and reactive to light. Sclerae anicteric. No conjunctival injection. No oro-pharyngeal erythema.  NECK: Supple. There is no jugular venous distention. No bruits, no lymphadenopathy, no thyromegaly.  HEART: Regular rate and  rhythm,. No murmurs, no rubs, no clicks.  LUNGS: Right lower lung some rhonchi no wheezing no accesory muscle usage ABDOMEN: Soft, flat, nontender, nondistended. Has good bowel sounds. No hepatosplenomegaly appreciated.  EXTREMITIES: No evidence of any cyanosis, clubbing, or peripheral edema.  +2 pedal and radial pulses bilaterally.  NEUROLOGIC: The patient is alert, awake, and oriented x3 with no focal motor or sensory deficits appreciated bilaterally.  SKIN: Moist and warm with no rashes appreciated.  Psych: Not anxious, depressed LN: No inguinal LN enlargement    Antibiotics   Anti-infectives    Start     Dose/Rate Route Frequency Ordered Stop   04/19/15 1815  cefTRIAXone (ROCEPHIN) 1 g in dextrose 5 % 50 mL IVPB     1 g 100 mL/hr over 30 Minutes Intravenous Every 24 hours 04/19/15 1807     04/19/15 1815  azithromycin (ZITHROMAX) 500 mg in dextrose 5 % 250 mL IVPB     500 mg 250 mL/hr over 60 Minutes Intravenous Every 24 hours 04/19/15 1807     04/19/15 1600  cefTRIAXone (ROCEPHIN) 1 g in dextrose 5 % 50 mL IVPB  Status:  Discontinued     1 g 100 mL/hr over 30 Minutes Intravenous Every 24 hours 04/19/15 1546 04/19/15 1920   04/19/15 1600  azithromycin (ZITHROMAX) 500 mg in dextrose 5 % 250 mL IVPB     500 mg 250 mL/hr over 60 Minutes Intravenous  Once 04/19/15 1546 04/19/15 1918   04/19/15 1545  levofloxacin (LEVAQUIN) IVPB 750 mg  Status:  Discontinued     750 mg 100  mL/hr over 90 Minutes Intravenous  Once 04/19/15 1541 04/19/15 1541      Medications   Scheduled Meds: . amLODipine  5 mg Oral Daily   And  . benazepril  20 mg Oral Daily  . aspirin EC  81 mg Oral Daily  . atorvastatin  10 mg Oral QHS  . azithromycin  500 mg Intravenous Q24H  . calcium-vitamin D  1 tablet Oral Daily  . cefTRIAXone (ROCEPHIN)  IV  1 g Intravenous Q24H  . cholecalciferol  1,000 Units Oral Daily  . clopidogrel  75 mg Oral Daily  . guaiFENesin  600 mg Oral BID  . heparin  5,000 Units Subcutaneous 3 times per day  . hydrochlorothiazide  25 mg Oral Daily  . ipratropium-albuterol  3 mL Nebulization Q6H  . metoprolol succinate  25 mg Oral Daily  . mometasone-formoterol  2 puff Inhalation BID  . multivitamin with minerals  1 tablet Oral Daily  . potassium chloride  10 mEq Oral BID  . potassium chloride  40 mEq Oral Once  . predniSONE  2 mg Oral Q supper  . [START ON 04/21/2015] predniSONE  5 mg Oral Q breakfast  . sodium chloride flush  3 mL Intravenous Q12H   Continuous Infusions:  PRN Meds:.sodium chloride, acetaminophen **OR** acetaminophen, acetaminophen, albuterol, nitroGLYCERIN, ondansetron **OR** ondansetron (ZOFRAN) IV, ondansetron, senna, sodium chloride flush   Data Review:   Micro Results Recent Results (from the past 240 hour(s))  Culture, blood (routine x 2)     Status: None (Preliminary result)   Collection Time: 04/19/15  4:04 PM  Result Value Ref Range Status   Specimen Description BLOOD RIGHT ANTECUBITAL  Final   Special Requests BOTTLES DRAWN AEROBIC AND ANAEROBIC  1CC  Final   Culture NO GROWTH < 24 HOURS  Final   Report Status PENDING  Incomplete  Culture, blood (routine x 2)  Status: None (Preliminary result)   Collection Time: 04/19/15  4:04 PM  Result Value Ref Range Status   Specimen Description BLOOD UNKNOWN  Final   Special Requests BOTTLES DRAWN AEROBIC AND ANAEROBIC  1CC  Final   Culture NO GROWTH < 24 HOURS  Final   Report  Status PENDING  Incomplete  Rapid Influenza A&B Antigens ([place] only)     Status: None   Collection Time: 04/19/15  5:15 PM  Result Value Ref Range Status   Influenza A [facility]) NEGATIVE  Final   Influenza B [facility]) NEGATIVE  Final    Radiology Reports Dg Chest 2 View  04/19/2015  CLINICAL DATA:  Shortness of breath and fever for 2 days. History of recurrent pneumonia. EXAM: CHEST  2 VIEW COMPARISON:  PA and lateral chest 11/06/2014.  CT chest 07/26/2013. FINDINGS: The lungs are emphysematous. The patient has a moderate right pleural effusion and right basilar airspace disease. Patchy airspace disease is seen throughout the remainder of the right chest. Mild left basilar atelectasis is noted. Heart size is normal. IMPRESSION: Findings most consistent with pneumonia in the right chest appearing worst in the lower lobe of. Associated moderate right pleural effusion is noted. Emphysema. Electronically Signed   By: Inge Rise M.D.   On: 04/19/2015 15:33     CBC  Recent Labs Lab 04/19/15 1646 04/20/15 0601  WBC 10.0 9.0  HGB 12.6 11.8*  HCT 39.6 36.1  PLT 203 220  MCV 89.5 88.3  MCH 28.4 29.0  MCHC 31.7* 32.8  RDW 15.5* 15.0*  LYMPHSABS 1.3  --   MONOABS 1.5*  --   EOSABS 0.1  --   BASOSABS 0.0  --     Chemistries   Recent Labs Lab 04/19/15 1807 04/20/15 0601  NA 139 140  K 3.4* 3.3*  CL 103 103  CO2 28 24  GLUCOSE 107* 187*  BUN 13 16  CREATININE 0.90 0.74  CALCIUM 8.1* 8.5*   ------------------------------------------------------------------------------------------------------------------ estimated creatinine clearance is 64.4 mL/min (by C-G formula based on Cr of 0.74). ------------------------------------------------------------------------------------------------------------------ No results for input(s): HGBA1C in the last 72 hours. ------------------------------------------------------------------------------------------------------------------ No results for  input(s): CHOL, HDL, LDLCALC, TRIG, CHOLHDL, LDLDIRECT in the last 72 hours. ------------------------------------------------------------------------------------------------------------------ No results for input(s): TSH, T4TOTAL, T3FREE, THYROIDAB in the last 72 hours.  Invalid input(s): FREET3 ------------------------------------------------------------------------------------------------------------------ No results for input(s): VITAMINB12, FOLATE, FERRITIN, TIBC, IRON, RETICCTPCT in the last 72 hours.  Coagulation profile No results for input(s): INR, PROTIME in the last 168 hours.  No results for input(s): DDIMER in the last 72 hours.  Cardiac Enzymes  Recent Labs Lab 04/19/15 1807  TROPONINI 0.06*   ------------------------------------------------------------------------------------------------------------------ Invalid input(s): POCBNP    Assessment & Plan   IMPRESSION AND PLAN: Patient is a 81 year old white female presenting with cough fever shortness of breath  1. Shortness of breath and cough due to pneumonia - recurrent pneumonia we'll check immunoglobulins Continue ceftriaxone and azithromycin Repeat chest x-ray in the morning  2. Acute on chronic COPD exasperation improved continue nebulizers change back to her oral steroid   3. Hyperlipidemia continue Lipitor  4. Hypertension continue metoprolol and Lotrel  5. Cad- plavix  5. Misc: lovenox for dvt proph     Code Status Orders        Start     Ordered   04/19/15 2025  Full code   Continuous     04/19/15 2024    Code Status History    Date Active Date Inactive Code Status Order  ID Comments User Context   11/02/2014 12:02 PM 11/08/2014  5:41 PM Full Code [id number]  Bettey Costa, MD Inpatient    Advance Directive Documentation        Most Recent Value   Type of Advance Directive  Living will, Healthcare Power of Attorney   Pre-existing out of facility DNR order (yellow form or pink MOST form)      "MOST" Form in Place?             Consults  none DVT Prophylaxis  Lovenox   Lab Results  Component Value Date   PLT 220 04/20/2015     Time Spent in minutes   32mn.   PDustin FlockM.D on 04/20/2015 at 11:24 AM  Between 7am to 6pm - Pager - [phone]  After 6pm go to [email or web address] - password [email or web address]   Office  [phone]

## 2015-04-21 ENCOUNTER — Inpatient Hospital Stay: Payer: Medicare Other

## 2015-04-21 LAB — IMMUNOGLOBULINS A/E/G/M, SERUM
IgA: 118 mg/dL (ref 64–422)
IgE (Immunoglobulin E), Serum: 56 IU/mL (ref 0–100)
IgG (Immunoglobin G), Serum: 528 mg/dL — ABNORMAL LOW (ref 700–1600)
IgM, Serum: 163 mg/dL (ref 26–217)

## 2015-04-21 MED ORDER — AZITHROMYCIN 250 MG PO TABS: 500.0000 mg | ORAL_TABLET | Freq: Every day | ORAL | Status: DC

## 2015-04-21 MED ORDER — LEVOFLOXACIN 500 MG PO TABS
500.0000 mg | ORAL_TABLET | Freq: Once | ORAL | Status: AC
  Administered 2015-04-21: 500 mg via ORAL
  Filled 2015-04-21: qty 1

## 2015-04-21 MED ORDER — LEVOFLOXACIN 500 MG PO TABS: 500.0000 mg | ORAL_TABLET | Freq: Every day | ORAL | Status: DC

## 2015-04-21 NOTE — Progress Notes (Signed)
PHARMACIST - PHYSICIAN COMMUNICATION  CONCERNING: Antibiotic IV to Oral Route Change Policy  RECOMMENDATION: This patient is receiving azithromycin 563m by the intravenous route.  Based on criteria approved by the Pharmacy and Therapeutics Committee, the antibiotic(s) is/are being converted to the equivalent oral dose form(s).   DESCRIPTION: These criteria include:  Patient being treated for a respiratory tract infection, urinary tract infection, cellulitis or clostridium difficile associated diarrhea if on metronidazole  The patient is not neutropenic and does not exhibit a GI malabsorption state  The patient is eating (either orally or via tube) and/or has been taking other orally administered medications for a least 24 hours  The patient is improving clinically and has a Tmax < 100.5  If you have questions about this conversion, please contact the Pharmacy Department  ([phone])  [place] PharmD Pharmacy Resident 04/21/2015 8:36 AM

## 2015-04-21 NOTE — Care Management Note (Signed)
Case Management Note  Patient Details  Name: Raven [name] MRN: [id number] Date of Birth: 10-Jul-1934  Subjective/Objective:     Has chronic home oxygen at 2L N/C supplied by [place].              Action/Plan:   Expected Discharge Date:                  Expected Discharge Plan:     In-House Referral:     Discharge planning Services     Post Acute Care Choice:    Choice offered to:     DME Arranged:    DME Agency:     HH Arranged:    [place] Agency:     Status of Service:     Medicare Important Message Given:    Date Medicare IM Given:    Medicare IM give by:    Date Additional Medicare IM Given:    Additional Medicare Important Message give by:     If discussed at [place] of Stay Meetings, dates discussed:    Additional Comments:  Furqan Gosselin A, RN 04/21/2015, 12:29 PM

## 2015-04-21 NOTE — Discharge Summary (Signed)
Raven [name], 81 y.o., DOB Feb 18, 1935, MRN [id number]. Admission date: 04/19/2015 Discharge Date 04/21/2015 Primary MD [name], MD Admitting Physician Dustin Flock, MD  Admission Diagnosis  Aspiration pneumonia of right lower lobe, unspecified aspiration pneumonia type [facility]) [J69.0]  Discharge Diagnosis   Active Problems:   Community-acquired pneumonia Arthritis Asthma Acute on chronic COPD exasperation Crohn's disease Pneumonia recurrent Hypertension Hyperlipidemia Coronary artery disease with MI Osteopenia Depression Anemia     Hospital Course   Raven [name] is a 81 y.o. female with a known history of COPD on chronic oxygen at nighttime, hypertension, hyperlipidemia, coronary artery disease presents to the emergency complaining of shortness of breath and cough ongoing for the past few days. Patient also has had fevers at home. She came to the ED and was noted to have pneumonia. Patient was started on antibiotics. With significant improvement in her symptoms, her fevers have resolved. Patient did have some effusion associated with the pneumonia. She will need a repeat chest x-ray with her primary care provider make sure resolution of this. If not she'll need a CT scan of the chest.           Consults  None  Significant Tests:  See full reports for all details      Dg Chest 2 View  04/21/2015  CLINICAL DATA:  81 year old female with shortness of breath and pneumonia follow-up. EXAM: CHEST  2 VIEW COMPARISON:  04/19/2015 FINDINGS: A right pleural effusion with right lower lung atelectasis/ airspace disease are unchanged. Cardiomediastinal silhouette is unchanged. Mild interstitial prominence is again noted. There is no evidence of pleural effusion or acute bony abnormality. IMPRESSION: Unchanged appearance of the chest with right pleural effusion and right lower lung atelectasis/airspace disease. Electronically Signed   By: Margarette Canada M.D.   On: 04/21/2015  09:41   Dg Chest 2 View  04/19/2015  CLINICAL DATA:  Shortness of breath and fever for 2 days. History of recurrent pneumonia. EXAM: CHEST  2 VIEW COMPARISON:  PA and lateral chest 11/06/2014.  CT chest 07/26/2013. FINDINGS: The lungs are emphysematous. The patient has a moderate right pleural effusion and right basilar airspace disease. Patchy airspace disease is seen throughout the remainder of the right chest. Mild left basilar atelectasis is noted. Heart size is normal. IMPRESSION: Findings most consistent with pneumonia in the right chest appearing worst in the lower lobe of. Associated moderate right pleural effusion is noted. Emphysema. Electronically Signed   By: Inge Rise M.D.   On: 04/19/2015 15:33       Today   Subjective:   Raven [name]  Feels much better denies any cp , sob resolved  Objective:   Blood pressure 141/61, pulse 94, temperature 98.1 F (36.7 C), temperature source Oral, resp. rate 18, height 5' 7"  (1.702 m), weight 83.734 kg (184 lb 9.6 oz), SpO2 92 %.  .  Intake/Output Summary (Last 24 hours) at 04/21/15 1247 Last data filed at 04/21/15 0900  Gross per 24 hour  Intake    600 ml  Output    950 ml  Net   -350 ml    Exam VITAL SIGNS: Blood pressure 141/61, pulse 94, temperature 98.1 F (36.7 C), temperature source Oral, resp. rate 18, height 5' 7"  (1.702 m), weight 83.734 kg (184 lb 9.6 oz), SpO2 92 %.  GENERAL:  81 y.o.-year-old patient lying in the bed with no acute distress.  EYES: Pupils equal, round, reactive to light and accommodation. No scleral icterus. Extraocular muscles intact.  HEENT: Head atraumatic, normocephalic. Oropharynx and nasopharynx clear.  NECK:  Supple, no jugular venous distention. No thyroid enlargement, no tenderness.  LUNGS: Normal breath sounds bilaterally, no wheezing, rales,rhonchi or crepitation. No use of accessory muscles of respiration.  CARDIOVASCULAR: S1, S2 normal. No murmurs, rubs, or gallops.  ABDOMEN: Soft,  nontender, nondistended. Bowel sounds present. No organomegaly or mass.  EXTREMITIES: No pedal edema, cyanosis, or clubbing.  NEUROLOGIC: Cranial nerves II through XII are intact. Muscle strength 5/5 in all extremities. Sensation intact. Gait not checked.  PSYCHIATRIC: The patient is alert and oriented x 3.  SKIN: No obvious rash, lesion, or ulcer.   Data Review     CBC w Diff: Lab Results  Component Value Date   WBC 9.0 04/20/2015   WBC 10.9 06/09/2014   HGB 11.8* 04/20/2015   HGB 11.9* 06/09/2014   HCT 36.1 04/20/2015   HCT 35.6 06/09/2014   PLT 220 04/20/2015   PLT 154 06/09/2014   LYMPHOPCT 13 04/19/2015   LYMPHOPCT 10.9 06/09/2014   MONOPCT 15 04/19/2015   MONOPCT 10.9 06/09/2014   MONOPCT 11 05/16/2012   EOSPCT 1 04/19/2015   EOSPCT 2.2 06/09/2014   BASOPCT 0 04/19/2015   BASOPCT 0.4 06/09/2014   CMP: Lab Results  Component Value Date   NA 140 04/20/2015   NA 137 06/09/2014   K 3.3* 04/20/2015   K 3.4* 06/09/2014   CL 103 04/20/2015   CL 106 06/09/2014   CO2 24 04/20/2015   CO2 24 06/09/2014   BUN 16 04/20/2015   BUN 11 06/09/2014   CREATININE 0.74 04/20/2015   CREATININE 0.70 06/09/2014   PROT 6.7 02/15/2015   PROT 5.8* 06/10/2014   ALBUMIN 3.7 02/15/2015   ALBUMIN 2.9* 06/10/2014   BILITOT 0.8 02/15/2015   BILITOT 0.7 06/10/2014   ALKPHOS 54 02/15/2015   ALKPHOS 66 06/10/2014   AST 25 02/15/2015   AST 18 06/10/2014   ALT 25 02/15/2015   ALT 17 06/10/2014  .  Micro Results Recent Results (from the past 240 hour(s))  Culture, blood (routine x 2)     Status: None (Preliminary result)   Collection Time: 04/19/15  4:04 PM  Result Value Ref Range Status   Specimen Description BLOOD RIGHT ANTECUBITAL  Final   Special Requests BOTTLES DRAWN AEROBIC AND ANAEROBIC  1CC  Final   Culture NO GROWTH < 24 HOURS  Final   Report Status PENDING  Incomplete  Culture, blood (routine x 2)     Status: None (Preliminary result)   Collection Time: 04/19/15  4:04 PM   Result Value Ref Range Status   Specimen Description BLOOD UNKNOWN  Final   Special Requests BOTTLES DRAWN AEROBIC AND ANAEROBIC  1CC  Final   Culture NO GROWTH < 24 HOURS  Final   Report Status PENDING  Incomplete  Rapid Influenza A&B Antigens ([place] only)     Status: None   Collection Time: 04/19/15  5:15 PM  Result Value Ref Range Status   Influenza A [facility]) NEGATIVE  Final   Influenza B [facility]) NEGATIVE  Final        Code Status Orders        Start     Ordered   04/19/15 2025  Full code   Continuous     04/19/15 2024    Code Status History    Date Active Date Inactive Code Status Order ID Comments User Context   11/02/2014 12:02 PM 11/08/2014  5:41 PM Full Code [id number]  Bettey Costa, MD Inpatient    Advance Directive Documentation        Most Recent Value   Type of Advance Directive  Living will, Healthcare Power of Attorney   Pre-existing out of facility DNR order (yellow form or pink MOST form)     "MOST" Form in Place?            Follow-up Information    Follow up with Sarina Ser, Hewitt Blade, MD In 7 days.   Specialty:  Internal Medicine   Why:  needs repeat cxr pa and lat time of visit   Contact information:   [place] [address] [phone]       Discharge Medications     Medication List    TAKE these medications        acetaminophen 500 MG tablet  Commonly known as:  TYLENOL  Take 500-1,000 mg by mouth every 6 (six) hours as needed for mild pain, fever or headache.     albuterol 108 (90 Base) MCG/ACT inhaler  Commonly known as:  PROVENTIL HFA;VENTOLIN HFA  Inhale 2 puffs into the lungs every 6 (six) hours as needed for wheezing or shortness of breath.     amLODipine-benazepril 5-20 MG capsule  Commonly known as:  LOTREL  Take 1 capsule by mouth daily.     aspirin EC 81 MG tablet  Take 81 mg by mouth daily.     atorvastatin 10 MG tablet  Commonly known as:  LIPITOR  Take 10 mg by mouth at  bedtime.     calcium-vitamin D 500-200 MG-UNIT tablet  Commonly known as:  OSCAL WITH D  Take 1 tablet by mouth daily.     cholecalciferol 1000 units tablet  Commonly known as:  VITAMIN D  Take 1,000 Units by mouth daily.     clopidogrel 75 MG tablet  Commonly known as:  PLAVIX  Take 75 mg by mouth daily.     Fluticasone-Salmeterol 250-50 MCG/DOSE Aepb  Commonly known as:  ADVAIR  Inhale 1 puff into the lungs 2 (two) times daily.     HUMIRA 40 MG/0.8ML Pskt  Generic drug:  Adalimumab  Inject 40 mg into the skin every 14 (fourteen) days.     hydrochlorothiazide 25 MG tablet  Commonly known as:  HYDRODIURIL  Take 25 mg by mouth daily.     levofloxacin 500 MG tablet  Commonly known as:  LEVAQUIN  Take 1 tablet (500 mg total) by mouth daily.     metoprolol succinate 25 MG 24 hr tablet  Commonly known as:  TOPROL-XL  Take 25 mg by mouth daily.     multivitamin with minerals Tabs tablet  Take 1 tablet by mouth daily.     nitroGLYCERIN 0.4 MG SL tablet  Commonly known as:  NITROSTAT  Place 0.4 mg under the tongue every 5 (five) minutes as needed for chest pain.     ondansetron 4 MG tablet  Commonly known as:  ZOFRAN  Take 1 tablet (4 mg total) by mouth daily as needed for nausea or vomiting.     potassium chloride 10 MEQ tablet  Commonly known as:  K-DUR,KLOR-CON  Take 10 mEq by mouth 2 (two) times daily.     predniSONE 5 MG tablet  Commonly known as:  DELTASONE  Take 5 mg by mouth daily with breakfast.     predniSONE 1 MG tablet  Commonly known as:  DELTASONE  Take 2 mg by mouth  daily with supper.     senna 8.6 MG tablet  Commonly known as:  SENOKOT  Take 1 tablet by mouth at bedtime as needed for constipation.           Total Time in preparing paper work, data evaluation and todays exam - 35 minutes  Dustin Flock M.D on 04/21/2015 at 12:47 PM  [facility]   Office  [phone]

## 2015-04-21 NOTE — Discharge Instructions (Signed)
°  DIET:  Cardiac diet  DISCHARGE CONDITION:  Stable  ACTIVITY:  Activity as tolerated  OXYGEN:  Home Oxygen: No.   Oxygen Delivery: room air  DISCHARGE LOCATION:  home    ADDITIONAL DISCHARGE INSTRUCTION: Needs cxr repeated at time of visit primary md  If you experience worsening of your admission symptoms, develop shortness of breath, life threatening emergency, suicidal or homicidal thoughts you must seek medical attention immediately by calling 911 or calling your MD immediately  if symptoms less severe.  You Must read complete instructions/literature along with all the possible adverse reactions/side effects for all the Medicines you take and that have been prescribed to you. Take any new Medicines after you have completely understood and accpet all the possible adverse reactions/side effects.   Please note  You were cared for by a hospitalist during your hospital stay. If you have any questions about your discharge medications or the care you received while you were in the hospital after you are discharged, you can call the unit and asked to speak with the hospitalist on call if the hospitalist that took care of you is not available. Once you are discharged, your primary care physician will handle any further medical issues. Please note that NO REFILLS for any discharge medications will be authorized once you are discharged, as it is imperative that you return to your primary care physician (or establish a relationship with a primary care physician if you do not have one) for your aftercare needs so that they can reassess your need for medications and monitor your lab values.

## 2015-04-21 NOTE — Progress Notes (Signed)
MD order received in [facility] to discharge pt home today; verbally reviewed AVS with pt including medications/Rx for Levaquin electronically submitted to [facility] previously, pt verbalized that [facility] is closed on Sundays, telephone call to Dr Dustin Flock advising asked for an order for Levaquin 536m PO once in order to get the pt's antibiotic dose in for today and the pt could pick the Rx up on 04/22/15, Dr [facility]ust[facility]to [facility]; activity as tolerated; follow up appointment/pt to call Dr JEllan Lambertoffice on 04/22/15 to schedule appointment for 7 days; no questions verbalized at this time; pt's discharge pending [facility]

## 2015-04-24 LAB — CULTURE, BLOOD (ROUTINE X 2)
Culture: NO GROWTH
Culture: NO GROWTH

## 2015-06-06 ENCOUNTER — Other Ambulatory Visit: Payer: Self-pay | Admitting: Specialist

## 2015-06-06 DIAGNOSIS — R911 Solitary pulmonary nodule: Secondary | ICD-10-CM

## 2015-06-13 ENCOUNTER — Ambulatory Visit: Payer: Medicare Other

## 2015-06-19 ENCOUNTER — Ambulatory Visit
Admission: RE | Admit: 2015-06-19 | Discharge: 2015-06-19 | Disposition: A | Discharge status: Home / Self Care | Payer: Medicare Other | Source: Ambulatory Visit | Attending: Specialist | Admitting: Specialist

## 2015-06-19 DIAGNOSIS — I2510 Atherosclerotic heart disease of native coronary artery without angina pectoris: Secondary | ICD-10-CM | POA: Diagnosis not present

## 2015-06-19 DIAGNOSIS — R918 Other nonspecific abnormal finding of lung field: Secondary | ICD-10-CM | POA: Insufficient documentation

## 2015-06-19 DIAGNOSIS — R911 Solitary pulmonary nodule: Secondary | ICD-10-CM | POA: Diagnosis not present

## 2015-06-19 DIAGNOSIS — J479 Bronchiectasis, uncomplicated: Secondary | ICD-10-CM | POA: Insufficient documentation

## 2015-07-17 ENCOUNTER — Ambulatory Visit: Payer: Medicare Other | Admitting: Podiatry

## 2015-07-18 ENCOUNTER — Encounter: Payer: Self-pay | Admitting: Podiatry

## 2015-07-18 ENCOUNTER — Ambulatory Visit (INDEPENDENT_AMBULATORY_CARE_PROVIDER_SITE_OTHER): Payer: Medicare Other | Admitting: Podiatry

## 2015-07-18 ENCOUNTER — Ambulatory Visit (INDEPENDENT_AMBULATORY_CARE_PROVIDER_SITE_OTHER): Payer: Medicare Other

## 2015-07-18 DIAGNOSIS — Q828 Other specified congenital malformations of skin: Secondary | ICD-10-CM | POA: Diagnosis not present

## 2015-07-18 DIAGNOSIS — M7671 Peroneal tendinitis, right leg: Secondary | ICD-10-CM

## 2015-07-18 DIAGNOSIS — R52 Pain, unspecified: Secondary | ICD-10-CM | POA: Diagnosis not present

## 2015-07-18 DIAGNOSIS — M7670 Peroneal tendinitis, unspecified leg: Secondary | ICD-10-CM | POA: Insufficient documentation

## 2015-07-18 NOTE — Progress Notes (Signed)
Patient ID: Raven [name], female   DOB: 10-02-1934, 81 y.o.   MRN: [id number]  Subjective: 81 year old female presents the office concerns of a reoccurring corn the left fifth toe she says the area is painful with shoe gear and pressure. Denies any redness or drainage. The last 2 weeks is also had pain in the outside part of her right foot. She states that he is having difficulty walking due to the pain. Denies any recent injury or trauma. She has some intermittent swelling to the area that any assistive erythema or increase in warmth. The pain has gotten somewhat better however does any. Numbness or tingling. The pain does not wake are benign.Denies any systemic complaints such as fevers, chills, nausea, vomiting. No acute changes since last appointment, and no other complaints at this time.   She has increased dose of prednisone recently get a locked jaw.  Objective: AAO x3, NAD DP/PT pulses palpable bilaterally, CRT less than 3 seconds Protective sensation intact with Simms Weinstein monofilament There is tenderness on the right foot on the course of the peroneal tendons and just proximal to the fifth metatarsal base. There is no vibratory sensation pain on the fifth metatarsal base. There is some mild edema to this area is looking erythema or increase in warmth. The peroneal tendons appear to be intact. Small hyperkeratotic lesion medial aspect distal fifth toe on the left foot. Upon debridement no underlying ulceration, drainage or other signs of infection. Adductovarus deformity present. No areas of pinpoint bony tenderness or pain with vibratory sensation. MMT 5/5, ROM WNL. No edema, erythema, increase in warmth to bilateral lower extremities.  No open lesions or pre-ulcerative lesions.  No pain with calf compression, swelling, warmth, erythema  Assessment: Peroneal tendinitis right foot, symptomatic hyperkeratotic lesion left foot  Plan: -All treatment options discussed with the  patient including all alternatives, risks, complications.  -X-rays were obtained and reviewed with the patient. There is some chronic changes the fifth metatarsal base and the right foot have there is no evidence of acute fracture. These are these are compared to ones taken in 2015. -For the right foot recommended ankle brace today due to the tendinitis. This was dispensed. Ordered compound cream for anti-inflammatory. She is at increased dose of prednisone already. Ice to the area daily. -Hyperkeratotic lesion left fifth toe debrided without complications or bleeding -Follow-up in 3-4 weeks or sooner if needed -Patient encouraged to call the office with any questions, concerns, change in symptoms.   Celesta Gentile, DPM

## 2015-08-19 ENCOUNTER — Ambulatory Visit (INDEPENDENT_AMBULATORY_CARE_PROVIDER_SITE_OTHER): Payer: Medicare Other | Admitting: Podiatry

## 2015-08-19 ENCOUNTER — Encounter: Payer: Self-pay | Admitting: Podiatry

## 2015-08-19 VITALS — BP 151/68 | HR 69 | Resp 12

## 2015-08-19 DIAGNOSIS — Q828 Other specified congenital malformations of skin: Secondary | ICD-10-CM

## 2015-08-19 NOTE — Progress Notes (Signed)
Presents today for follow-up of porokeratotic lesion plantar aspect of the foot.  Objective: Porokeratosis plantar aspect of the foot with no open lesions or wounds. Pulses remain palpable.  Porokeratosis.  Plan: Mechanical and chemical destruction of the lesion will follow up with her on as-needed basis.

## 2016-01-16 ENCOUNTER — Encounter: Payer: Self-pay | Admitting: Emergency Medicine

## 2016-01-16 ENCOUNTER — Emergency Department: Payer: Medicare Other

## 2016-01-16 ENCOUNTER — Emergency Department
Admission: EM | Admit: 2016-01-16 | Discharge: 2016-01-16 | Disposition: A | Discharge status: Home / Self Care | Payer: Medicare Other | Attending: Emergency Medicine | Admitting: Emergency Medicine

## 2016-01-16 DIAGNOSIS — Z7982 Long term (current) use of aspirin: Secondary | ICD-10-CM | POA: Diagnosis not present

## 2016-01-16 DIAGNOSIS — J45909 Unspecified asthma, uncomplicated: Secondary | ICD-10-CM | POA: Insufficient documentation

## 2016-01-16 DIAGNOSIS — J449 Chronic obstructive pulmonary disease, unspecified: Secondary | ICD-10-CM | POA: Insufficient documentation

## 2016-01-16 DIAGNOSIS — M25462 Effusion, left knee: Secondary | ICD-10-CM | POA: Insufficient documentation

## 2016-01-16 DIAGNOSIS — Z79899 Other long term (current) drug therapy: Secondary | ICD-10-CM | POA: Diagnosis not present

## 2016-01-16 DIAGNOSIS — Z87891 Personal history of nicotine dependence: Secondary | ICD-10-CM | POA: Insufficient documentation

## 2016-01-16 DIAGNOSIS — I10 Essential (primary) hypertension: Secondary | ICD-10-CM | POA: Diagnosis not present

## 2016-01-16 DIAGNOSIS — M25562 Pain in left knee: Secondary | ICD-10-CM

## 2016-01-16 NOTE — ED Triage Notes (Signed)
Left knee/calf pain x4 days, after driving home from the beach. Pain increasing , on Plavix for cardiac stent.

## 2016-01-16 NOTE — Discharge Instructions (Signed)
Please use over-the-counter Tylenol or ibuprofen as needed for discomfort. You may obtain a compressive knee brace from any pharmacy which may help with your discomfort as well. Please call the number provided to follow-up with orthopedics in the next 1 week if pain continues for further evaluation. Return to the emergency department for any significant worsening of pain, or any other symptom personally concerning to yourself.

## 2016-01-16 NOTE — ED Provider Notes (Signed)
[facility] Emergency Department Provider Note  Time seen: 4:27 PM  I have reviewed the triage vital signs and the nursing notes.   HISTORY  Chief Complaint Leg Pain    HPI Raven [name] is a 81 y.o. female with a past medical history of MI, hypertension, hyperlipidemia, status post stent on Plavix who presents the emergency department with left leg pain. According to the patient proximally 5 days ago she drove home from the beach approximately 3 hours, upon arriving home she noticed some discomfort behind her left knee. States the discomfort has continued for the past 4 or 5 days, somewhat worse when ambulating. Patient was concerned she may have developed a blood clot so she came to the emergency department for evaluation. Patient has been able to ambulate although she states it is uncomfortable to ambulate on. She has also noticed a popping and clicking sensation as well.  Past Medical History:  Diagnosis Date  . Anemia   . Arthritis   . Asthma   . COPD (chronic obstructive pulmonary disease) ([place])   . Crohn's disease ([place])   . Depression   . Hyperlipidemia   . Hypertension   . Myocardial infarction   . Osteopenia   . Peptic ulcer disease   . Pneumonia     Patient Active Problem List   Diagnosis Date Noted  . Peroneal tendonitis 07/18/2015  . Pneumonia 04/19/2015  . CAP (community acquired pneumonia) 11/02/2014    Past Surgical History:  Procedure Laterality Date  . ABDOMINAL SURGERY    . CARDIAC CATHETERIZATION    . COLONOSCOPY WITH PROPOFOL N/A 09/27/2014   Procedure: COLONOSCOPY WITH PROPOFOL;  Surgeon: [name], MD;  Location: [facility] ENDOSCOPY;  Service: Gastroenterology;  Laterality: N/A;  . SHOULDER SURGERY Right    x 2   . SMALL INTESTINE SURGERY      Prior to Admission medications   Medication Sig Start Date End Date Taking? Authorizing Provider  acetaminophen (TYLENOL) 500 MG tablet Take 500-1,000 mg by mouth every 6 (six) hours  as needed for mild pain, fever or headache.     Historical Provider, MD  Adalimumab (HUMIRA) 40 MG/0.8ML PSKT Inject 40 mg into the skin every 14 (fourteen) days.    Historical Provider, MD  albuterol (PROVENTIL HFA;VENTOLIN HFA) 108 (90 BASE) MCG/ACT inhaler Inhale 2 puffs into the lungs every 6 (six) hours as needed for wheezing or shortness of breath.    Historical Provider, MD  amLODipine-benazepril (LOTREL) 5-20 MG per capsule Take 1 capsule by mouth daily.     Historical Provider, MD  aspirin EC 81 MG tablet Take 81 mg by mouth daily.    Historical Provider, MD  atorvastatin (LIPITOR) 10 MG tablet Take 10 mg by mouth at bedtime.    Historical Provider, MD  calcium-vitamin D (OSCAL WITH D) 500-200 MG-UNIT tablet Take 1 tablet by mouth daily.    Historical Provider, MD  cholecalciferol (VITAMIN D) 1000 units tablet Take 1,000 Units by mouth daily.    Historical Provider, MD  clopidogrel (PLAVIX) 75 MG tablet Take 75 mg by mouth daily.    Historical Provider, MD  Fluticasone-Salmeterol (ADVAIR) 250-50 MCG/DOSE AEPB Inhale 1 puff into the lungs 2 (two) times daily.    Historical Provider, MD  hydrochlorothiazide (HYDRODIURIL) 25 MG tablet Take 25 mg by mouth daily.     Historical Provider, MD  levofloxacin (LEVAQUIN) 500 MG tablet Take 1 tablet (500 mg total) by mouth daily. 04/21/15   Shreyang Posey Pronto,  MD  metoprolol succinate (TOPROL-XL) 25 MG 24 hr tablet Take 25 mg by mouth daily.    Historical Provider, MD  Multiple Vitamin (MULTIVITAMIN WITH MINERALS) TABS tablet Take 1 tablet by mouth daily.    Historical Provider, MD  nitroGLYCERIN (NITROSTAT) 0.4 MG SL tablet Place 0.4 mg under the tongue every 5 (five) minutes as needed for chest pain.    Historical Provider, MD  ondansetron (ZOFRAN) 4 MG tablet Take 1 tablet (4 mg total) by mouth daily as needed for nausea or vomiting. 02/15/15   Orbie Pyo, MD  potassium chloride (K-DUR,KLOR-CON) 10 MEQ tablet Take 10 mEq by mouth 2 (two) times  daily.    Historical Provider, MD  predniSONE (DELTASONE) 1 MG tablet Take 2 mg by mouth daily with supper.    Historical Provider, MD  predniSONE (DELTASONE) 5 MG tablet Take 5 mg by mouth daily with breakfast.    Historical Provider, MD  senna (SENOKOT) 8.6 MG tablet Take 1 tablet by mouth at bedtime as needed for constipation.     Historical Provider, MD    Allergies  Allergen Reactions  . Penicillins Other (See Comments)    Reaction:  Unknown   . Augmentin [Amoxicillin-Pot Clavulanate] Other (See Comments)    Reaction:  Unknown   . Indocin [Indomethacin] Other (See Comments)    Reaction:  Unknown   . Lodine [Etodolac] Other (See Comments)    Reaction:   GI upset   . Methotrexate Derivatives Other (See Comments)    Reaction:  GI upset   . Gold Rash  . Zantac [Ranitidine Hcl] Rash    Family History  Problem Relation Age of Onset  . Hypertension      Social History Social History  Substance Use Topics  . Smoking status: Former Research scientist (life sciences)  . Smokeless tobacco: Never Used  . Alcohol use No    Review of Systems Constitutional: Negative for fever. Cardiovascular: Negative for chest pain. Respiratory: Negative for shortness of breath. Gastrointestinal: Negative for abdominal pain Musculoskeletal: Left knee pain Neurological: Negative for headache 10-point ROS otherwise negative.  ____________________________________________   PHYSICAL EXAM:  VITAL SIGNS: ED Triage Vitals  Enc Vitals Group     BP 01/16/16 1456 (!) 153/55     Pulse Rate 01/16/16 1456 95     Resp 01/16/16 1456 18     Temp 01/16/16 1456 98.1 F (36.7 C)     Temp Source 01/16/16 1456 Oral     SpO2 01/16/16 1456 92 %     Weight 01/16/16 1457 180 lb (81.6 kg)     Height 01/16/16 1457 5' 7"  (1.702 m)     Head Circumference --      Peak Flow --      Pain Score 01/16/16 1457 3     Pain Loc --      Pain Edu? --      Excl. in [place]? --     Constitutional: Alert and oriented. Well appearing and in no  distress. Eyes: Normal exam ENT   Head: Normocephalic and atraumatic   Mouth/Throat: Mucous membranes are moist. Cardiovascular: Normal rate, regular rhythm. No murmur Respiratory: Normal respiratory effort without tachypnea nor retractions. Breath sounds are clear  Gastrointestinal: Soft and nontender. No distention.  Musculoskeletal: Mild effusion noted on the left knee, with mild tenderness palpation of the left knee moderate tenderness palpation of the popliteal fossa. 2+ DP pulses, no calf tenderness or lower extremity edema. Neurologic:  Normal speech and language. No gross focal  neurologic deficits  Skin:  Skin is warm, dry and intact.  Psychiatric: Mood and affect are normal.   ____________________________________________     RADIOLOGY  Ultrasound negative for DVT  ____________________________________________   INITIAL IMPRESSION / ASSESSMENT AND PLAN / ED COURSE  Pertinent labs & imaging results that were available during my care of the patient were reviewed by me and considered in my medical decision making (see chart for details).  Patient presents with left knee pain for the past 4 or 5 days. Somewhat worse when ambulating to the patient is able to ambulate without much difficulty. Denies any chest pain or shortness of breath. Patient takes Plavix which should provide some protection from DVT.  Patient's ultrasound has resulted negative. Given the patient's clicking sensation along with mild joint effusion and tenderness have a suspect likely hemarthrosis/effusion causing the patient's discomfort. The patient is able to ambulate I do not suspect bony abnormality or fracture. I discussed with the patient supportive care including Tylenol/ibuprofen for discomfort, over-the-counter compressive knee brace to help with discomfort and follow-up with an orthopedic. Patient agreeable to plan.  ____________________________________________   FINAL CLINICAL IMPRESSION(S) /  ED DIAGNOSES  Left knee pain    Harvest Dark, MD 01/16/16 [id number]

## 2016-02-06 ENCOUNTER — Other Ambulatory Visit: Payer: Self-pay | Admitting: Physician Assistant

## 2016-02-06 DIAGNOSIS — M2392 Unspecified internal derangement of left knee: Secondary | ICD-10-CM

## 2016-02-20 ENCOUNTER — Ambulatory Visit
Admission: RE | Admit: 2016-02-20 | Discharge: 2016-02-20 | Disposition: A | Discharge status: Home / Self Care | Payer: Medicare Other | Source: Ambulatory Visit | Attending: Physician Assistant | Admitting: Physician Assistant

## 2016-02-20 DIAGNOSIS — M659 Synovitis and tenosynovitis, unspecified: Secondary | ICD-10-CM | POA: Diagnosis not present

## 2016-02-20 DIAGNOSIS — X58XXXA Exposure to other specified factors, initial encounter: Secondary | ICD-10-CM | POA: Insufficient documentation

## 2016-02-20 DIAGNOSIS — S83282A Other tear of lateral meniscus, current injury, left knee, initial encounter: Secondary | ICD-10-CM | POA: Insufficient documentation

## 2016-02-20 DIAGNOSIS — M25462 Effusion, left knee: Secondary | ICD-10-CM | POA: Diagnosis not present

## 2016-02-20 DIAGNOSIS — M2392 Unspecified internal derangement of left knee: Secondary | ICD-10-CM | POA: Diagnosis present

## 2016-03-01 ENCOUNTER — Emergency Department: Payer: Medicare Other

## 2016-03-01 ENCOUNTER — Emergency Department
Admission: EM | Admit: 2016-03-01 | Discharge: 2016-03-01 | Disposition: A | Discharge status: Home / Self Care | Payer: Medicare Other | Attending: Emergency Medicine | Admitting: Emergency Medicine

## 2016-03-01 ENCOUNTER — Encounter: Payer: Self-pay | Admitting: Emergency Medicine

## 2016-03-01 DIAGNOSIS — Z87891 Personal history of nicotine dependence: Secondary | ICD-10-CM | POA: Diagnosis not present

## 2016-03-01 DIAGNOSIS — J45909 Unspecified asthma, uncomplicated: Secondary | ICD-10-CM | POA: Diagnosis not present

## 2016-03-01 DIAGNOSIS — Z79899 Other long term (current) drug therapy: Secondary | ICD-10-CM | POA: Diagnosis not present

## 2016-03-01 DIAGNOSIS — M7122 Synovial cyst of popliteal space [Baker], left knee: Secondary | ICD-10-CM | POA: Diagnosis not present

## 2016-03-01 DIAGNOSIS — M25562 Pain in left knee: Secondary | ICD-10-CM | POA: Diagnosis present

## 2016-03-01 DIAGNOSIS — I10 Essential (primary) hypertension: Secondary | ICD-10-CM | POA: Diagnosis not present

## 2016-03-01 DIAGNOSIS — Z7982 Long term (current) use of aspirin: Secondary | ICD-10-CM | POA: Insufficient documentation

## 2016-03-01 DIAGNOSIS — J449 Chronic obstructive pulmonary disease, unspecified: Secondary | ICD-10-CM | POA: Insufficient documentation

## 2016-03-01 MED ORDER — MELOXICAM 7.5 MG PO TABS: 7.5000 mg | ORAL_TABLET | Freq: Every day | ORAL | 0 refills | Status: DC

## 2016-03-01 NOTE — ED Notes (Signed)
Discharge instructions reviewed with patient. Questions fielded by this RN. Patient verbalizes understanding of instructions. Patient discharged home in stable condition per Dahlia Client MD . No acute distress noted at time of discharge.

## 2016-03-01 NOTE — ED Provider Notes (Signed)
[facility] Emergency Department Provider Note   ____________________________________________   First MD Initiated Contact with Patient 03/01/16 2307     (approximate)  I have reviewed the triage vital signs and the nursing notes.   HISTORY  Chief Complaint Knee Pain    HPI BRIDGITTE FELICETTI is a 81 y.o. female who comes into the hospital today with some leg problems. The patient has a torn meniscus on her left knee and is scheduled to see a surgeon on January 4. She reports it is been bothering her and it was swollen some today. She reports though that this evening she developed some swelling behind her knee that the cane worse within an hour. She reports that she meant to take some Tylenol for pain but never got around to it. She reports that if she doesn't move it doesn't really hurt her. The patient was concerned because of this rapid swelling so decided to come into the hospital. The patient has had no fever and reports her knee always has some warmth but nothing new today. The patient is planning to see Dr. Bess Harvest for her meniscus. The patient decided to come in and get checked out. She was concerned about a blood clot.   Past Medical History:  Diagnosis Date  . Anemia   . Arthritis   . Asthma   . COPD (chronic obstructive pulmonary disease) ([place])   . Crohn's disease ([place])   . Depression   . Hyperlipidemia   . Hypertension   . Myocardial infarction   . Osteopenia   . Peptic ulcer disease   . Pneumonia     Patient Active Problem List   Diagnosis Date Noted  . Peroneal tendonitis 07/18/2015  . Pneumonia 04/19/2015  . CAP (community acquired pneumonia) 11/02/2014    Past Surgical History:  Procedure Laterality Date  . ABDOMINAL SURGERY    . CARDIAC CATHETERIZATION    . COLONOSCOPY WITH PROPOFOL N/A 09/27/2014   Procedure: COLONOSCOPY WITH PROPOFOL;  Surgeon: [name], MD;  Location: [facility] ENDOSCOPY;  Service: Gastroenterology;  Laterality:  N/A;  . SHOULDER SURGERY Right    x 2   . SMALL INTESTINE SURGERY      Prior to Admission medications   Medication Sig Start Date End Date Taking? Authorizing Provider  acetaminophen (TYLENOL) 500 MG tablet Take 500-1,000 mg by mouth every 6 (six) hours as needed for mild pain, fever or headache.     Historical Provider, MD  Adalimumab (HUMIRA) 40 MG/0.8ML PSKT Inject 40 mg into the skin every 14 (fourteen) days.    Historical Provider, MD  albuterol (PROVENTIL HFA;VENTOLIN HFA) 108 (90 BASE) MCG/ACT inhaler Inhale 2 puffs into the lungs every 6 (six) hours as needed for wheezing or shortness of breath.    Historical Provider, MD  amLODipine-benazepril (LOTREL) 5-20 MG per capsule Take 1 capsule by mouth daily.     Historical Provider, MD  aspirin EC 81 MG tablet Take 81 mg by mouth daily.    Historical Provider, MD  atorvastatin (LIPITOR) 10 MG tablet Take 10 mg by mouth at bedtime.    Historical Provider, MD  calcium-vitamin D (OSCAL WITH D) 500-200 MG-UNIT tablet Take 1 tablet by mouth daily.    Historical Provider, MD  cholecalciferol (VITAMIN D) 1000 units tablet Take 1,000 Units by mouth daily.    Historical Provider, MD  clopidogrel (PLAVIX) 75 MG tablet Take 75 mg by mouth daily.    Historical Provider, MD  Fluticasone-Salmeterol (ADVAIR) 250-50  MCG/DOSE AEPB Inhale 1 puff into the lungs 2 (two) times daily.    Historical Provider, MD  hydrochlorothiazide (HYDRODIURIL) 25 MG tablet Take 25 mg by mouth daily.     Historical Provider, MD  levofloxacin (LEVAQUIN) 500 MG tablet Take 1 tablet (500 mg total) by mouth daily. 04/21/15   Dustin Flock, MD  meloxicam (MOBIC) 7.5 MG tablet Take 1 tablet (7.5 mg total) by mouth daily. 03/01/16 03/01/17  Loney Hering, MD  metoprolol succinate (TOPROL-XL) 25 MG 24 hr tablet Take 25 mg by mouth daily.    Historical Provider, MD  Multiple Vitamin (MULTIVITAMIN WITH MINERALS) TABS tablet Take 1 tablet by mouth daily.    Historical Provider, MD    nitroGLYCERIN (NITROSTAT) 0.4 MG SL tablet Place 0.4 mg under the tongue every 5 (five) minutes as needed for chest pain.    Historical Provider, MD  ondansetron (ZOFRAN) 4 MG tablet Take 1 tablet (4 mg total) by mouth daily as needed for nausea or vomiting. 02/15/15   Orbie Pyo, MD  potassium chloride (K-DUR,KLOR-CON) 10 MEQ tablet Take 10 mEq by mouth 2 (two) times daily.    Historical Provider, MD  predniSONE (DELTASONE) 1 MG tablet Take 2 mg by mouth daily with supper.    Historical Provider, MD  predniSONE (DELTASONE) 5 MG tablet Take 5 mg by mouth daily with breakfast.    Historical Provider, MD  senna (SENOKOT) 8.6 MG tablet Take 1 tablet by mouth at bedtime as needed for constipation.     Historical Provider, MD    Allergies Penicillins; Augmentin [amoxicillin-pot clavulanate]; Indocin [indomethacin]; Lodine [etodolac]; Methotrexate derivatives; Gold; and Zantac [ranitidine hcl]  Family History  Problem Relation Age of Onset  . Hypertension      Social History Social History  Substance Use Topics  . Smoking status: Former Research scientist (life sciences)  . Smokeless tobacco: Never Used  . Alcohol use No    Review of Systems Constitutional: No fever/chills Eyes: No visual changes. ENT: No sore throat. Cardiovascular: Denies chest pain. Respiratory: Denies shortness of breath. Gastrointestinal: No abdominal pain.  No nausea, no vomiting.  No diarrhea.  No constipation. Genitourinary: Negative for dysuria. Musculoskeletal: Knee pain and swelling Skin: Negative for rash. Neurological: Negative for headaches, focal weakness or numbness.  10-point ROS otherwise negative.  ____________________________________________   PHYSICAL EXAM:  VITAL SIGNS: ED Triage Vitals  Enc Vitals Group     BP 03/01/16 2011 (!) 149/55     Pulse Rate 03/01/16 2011 87     Resp 03/01/16 2011 16     Temp 03/01/16 2011 98 F (36.7 C)     Temp Source 03/01/16 2011 Oral     SpO2 03/01/16 2011 96 %      Weight 03/01/16 2012 182 lb (82.6 kg)     Height 03/01/16 2012 5' 6"  (1.676 m)     Head Circumference --      Peak Flow --      Pain Score 03/01/16 2012 3     Pain Loc --      Pain Edu? --      Excl. in [place]? --     Constitutional: Alert and oriented. Well appearing and in Mild distress. Eyes: Conjunctivae are normal. PERRL. EOMI. Head: Atraumatic. Nose: No congestion/rhinnorhea. Mouth/Throat: Mucous membranes are moist.  Oropharynx non-erythematous. Cardiovascular: Normal rate, regular rhythm. Grossly normal heart sounds.  Good peripheral circulation. Respiratory: Normal respiratory effort.  No retractions. Lungs CTAB. Gastrointestinal: Soft and nontender. No distention. Positive bowel sounds  Musculoskeletal: Soft tissue swelling to left posterior knee with mild tenderness to palpation no warmth or erythema over the area no induration or fluctuance. Neurologic:  Normal speech and language. No gross focal neurologic deficits are appreciated. No gait instability. Skin:  Skin is warm, dry and intact.  Psychiatric: Mood and affect are normal.   ____________________________________________   LABS (all labs ordered are listed, but only abnormal results are displayed)  Labs Reviewed - No data to display ____________________________________________  EKG  none ____________________________________________  RADIOLOGY  Left lower extremity ultrasound ____________________________________________   PROCEDURES  Procedure(s) performed: None  Procedures  Critical Care performed: No  ____________________________________________   INITIAL IMPRESSION / ASSESSMENT AND PLAN / ED COURSE  Pertinent labs & imaging results that were available during my care of the patient were reviewed by me and considered in my medical decision making (see chart for details).  This is a 81 year old female who comes into the hospital today with some swelling to her posterior knee. The patient did not  take anything for pain at home. She reports that she is not hurting too bad right now. The patient was sent for an ultrasound and it was found that the patient has a Baker's cyst to her popliteal area. This cyst likely originates from the fact that the patient has at toward meniscus. She does take steroids daily. As she is not having pain right now I will not give her pain medicine right now but I will send the patient home with some meloxicam until she can see Dr. Bess Harvest as scheduled. The patient will be discharged home.  Clinical Course as of Mar 02 38  Nancy Fetter Mar 01, 2016  2304 No evidence of deep venous thrombosis.  Complex cystic structure within the left popliteal fossa, at the site of pain. This may represent inflamed/infected Baker's cyst versus organizing hematoma.   US Venous Img Lower Unilateral Left [AW]    Clinical Course User Index [AW] Loney Hering, MD     ____________________________________________   FINAL CLINICAL IMPRESSION(S) / ED DIAGNOSES  Final diagnoses:  Baker's cyst of knee, left      NEW MEDICATIONS STARTED DURING THIS VISIT:  Discharge Medication List as of 03/01/2016 11:22 PM    START taking these medications   Details  meloxicam (MOBIC) 7.5 MG tablet Take 1 tablet (7.5 mg total) by mouth daily., Starting Sun 03/01/2016, Until Mon 03/01/2017, Print         Note:  This document was prepared using Dragon voice recognition software and may include unintentional dictation errors.    Loney Hering, MD 03/02/16 [phone]

## 2016-03-01 NOTE — ED Triage Notes (Signed)
Pt states pain and swelling to posterior left knee for last hour. Left ankle is swollen, pt with 3+ ankle pulse noted.

## 2016-03-01 NOTE — ED Notes (Signed)
Pt to u/s

## 2016-03-01 NOTE — Discharge Instructions (Signed)
Please follow up with Dr. Marry Guan as you have schedules to repair your meniscus. Please use your cane at home to help place less weight on the leg. Please return with any other concerns.

## 2016-04-09 ENCOUNTER — Inpatient Hospital Stay: Admission: RE | Admit: 2016-04-09 | Payer: Medicare Other | Source: Ambulatory Visit

## 2016-04-13 ENCOUNTER — Encounter
Admission: RE | Admit: 2016-04-13 | Discharge: 2016-04-13 | Disposition: A | Discharge status: Home / Self Care | Payer: Medicare Other | Source: Ambulatory Visit | Attending: Orthopedic Surgery | Admitting: Orthopedic Surgery

## 2016-04-13 DIAGNOSIS — Z01812 Encounter for preprocedural laboratory examination: Secondary | ICD-10-CM | POA: Insufficient documentation

## 2016-04-13 DIAGNOSIS — M2392 Unspecified internal derangement of left knee: Secondary | ICD-10-CM | POA: Diagnosis not present

## 2016-04-13 HISTORY — DX: Other complications of anesthesia, initial encounter: T88.59XA

## 2016-04-13 HISTORY — DX: Adverse effect of unspecified anesthetic, initial encounter: T41.45XA

## 2016-04-13 HISTORY — DX: Other specified postprocedural states: Z98.890

## 2016-04-13 HISTORY — DX: Nausea with vomiting, unspecified: R11.2

## 2016-04-13 LAB — CBC
HCT: 31.9 % — ABNORMAL LOW (ref 35.0–47.0)
Hemoglobin: 10.6 g/dL — ABNORMAL LOW (ref 12.0–16.0)
MCH: 26.8 pg (ref 26.0–34.0)
MCHC: 33.2 g/dL (ref 32.0–36.0)
MCV: 80.7 fL (ref 80.0–100.0)
Platelets: 292 10*3/uL (ref 150–440)
RBC: 3.95 MIL/uL (ref 3.80–5.20)
RDW: 16.4 % — ABNORMAL HIGH (ref 11.5–14.5)
WBC: 9.2 10*3/uL (ref 3.6–11.0)

## 2016-04-13 LAB — BASIC METABOLIC PANEL
Anion gap: 8 (ref 5–15)
BUN: 20 mg/dL (ref 6–20)
CO2: 26 mmol/L (ref 22–32)
Calcium: 8.9 mg/dL (ref 8.9–10.3)
Chloride: 108 mmol/L (ref 101–111)
Creatinine, Ser: 0.88 mg/dL (ref 0.44–1.00)
GFR calc Af Amer: >60 mL/min (ref >60)
GFR calc non Af Amer: >60 mL/min (ref >60)
Glucose, Bld: 102 mg/dL — ABNORMAL HIGH (ref 65–99)
Potassium: 3.6 mmol/L (ref 3.5–5.1)
Sodium: 142 mmol/L (ref 135–145)

## 2016-04-13 LAB — DIFFERENTIAL
Basophils Absolute: 0 10*3/uL (ref 0–0.1)
Basophils Relative: 0 %
Eosinophils Absolute: 0.1 10*3/uL (ref 0–0.7)
Eosinophils Relative: 1 %
Lymphocytes Relative: 11 %
Lymphs Abs: 1 10*3/uL (ref 1.0–3.6)
Monocytes Absolute: 1.4 10*3/uL — ABNORMAL HIGH (ref 0.2–0.9)
Monocytes Relative: 15 %
Neutro Abs: 6.7 10*3/uL — ABNORMAL HIGH (ref 1.4–6.5)
Neutrophils Relative %: 73 %

## 2016-04-13 NOTE — Patient Instructions (Signed)
  Your procedure is scheduled on: Wed. 04/22/16 Report to Day Surgery. To find out your arrival time please call [phone] between 1PM - 3PM on Tues. 04/21/16.  Remember: Instructions that are not followed completely may result in serious medical risk, up to and including death, or upon the discretion of your surgeon and anesthesiologist your surgery may need to be rescheduled.    __x__ 1. Do not eat food or drink liquids after midnight. No gum chewing or hard candies.     __x__ 2. No Alcohol for 24 hours before or after surgery.   ____ 3. Do Not Smoke For 24 Hours Prior to Your Surgery.   ____ 4. Bring all medications with you on the day of surgery if instructed.    _x___ 5. Notify your doctor if there is any change in your medical condition     (cold, fever, infections).       Do not wear jewelry, make-up, hairpins, clips or nail polish.  Do not wear lotions, powders, or perfumes. You may wear deodorant.  Do not shave 48 hours prior to surgery. Men may shave face and neck.  Do not bring valuables to the hospital.    [facility] is not responsible for any belongings or valuables.               Contacts, dentures or bridgework may not be worn into surgery.  Leave your suitcase in the car. After surgery it may be brought to your room.  For patients admitted to the hospital, discharge time is determined by your                treatment team.   Patients discharged the day of surgery will not be allowed to drive home.   Please read over the following fact sheets that you were given:      __x__ Take these medicines the morning of surgery with A SIP OF WATER:    1amLODipine-benazepril (LOTREL) 5-20 MG per capsule.   2. esomeprazole (NEXIUM) 20 MG capsule  3. Fluticasone-Salmeterol (ADVAIR) 250-50 MCG/DOSE AEPB        4.albuterol (PROVENTIL HFA;VENTOLIN HFA) 108 (90 BASE) MCG/ACT inhaler  5.metoprolol succinate (TOPROL-XL) 25 MG 24 hr tablet  6.  ____ Fleet Enema (as  directed)   _x___ Use CHG Soap as directed  _x___ Use inhalers on the day of surgery  ____ Stop metformin 2 days prior to surgery    ____ Take 1/2 of usual insulin dose the night before surgery and none on the morning of surgery.   __x__ Stop Plavix on 12/11 last dose  ____ Stop Anti-inflammatories on    __x__ Stop supplements until after surgery. Vit E 04/15/16   ____ Bring C-Pap to the hospital.

## 2016-04-17 ENCOUNTER — Emergency Department: Payer: Medicare Other

## 2016-04-17 ENCOUNTER — Other Ambulatory Visit: Payer: Self-pay

## 2016-04-17 ENCOUNTER — Encounter: Payer: Self-pay | Admitting: Emergency Medicine

## 2016-04-17 ENCOUNTER — Inpatient Hospital Stay
Admission: EM | Admit: 2016-04-17 | Discharge: 2016-04-20 | DRG: 871 | Disposition: A | Discharge status: Home / Self Care | Payer: Medicare Other | Attending: Internal Medicine | Admitting: Internal Medicine

## 2016-04-17 DIAGNOSIS — J209 Acute bronchitis, unspecified: Secondary | ICD-10-CM

## 2016-04-17 DIAGNOSIS — Z87891 Personal history of nicotine dependence: Secondary | ICD-10-CM | POA: Diagnosis not present

## 2016-04-17 DIAGNOSIS — Z8711 Personal history of peptic ulcer disease: Secondary | ICD-10-CM | POA: Diagnosis not present

## 2016-04-17 DIAGNOSIS — Z9981 Dependence on supplemental oxygen: Secondary | ICD-10-CM | POA: Diagnosis not present

## 2016-04-17 DIAGNOSIS — J441 Chronic obstructive pulmonary disease with (acute) exacerbation: Secondary | ICD-10-CM

## 2016-04-17 DIAGNOSIS — E785 Hyperlipidemia, unspecified: Secondary | ICD-10-CM | POA: Diagnosis present

## 2016-04-17 DIAGNOSIS — Z8249 Family history of ischemic heart disease and other diseases of the circulatory system: Secondary | ICD-10-CM

## 2016-04-17 DIAGNOSIS — A419 Sepsis, unspecified organism: Secondary | ICD-10-CM

## 2016-04-17 DIAGNOSIS — J440 Chronic obstructive pulmonary disease with acute lower respiratory infection: Secondary | ICD-10-CM | POA: Diagnosis present

## 2016-04-17 DIAGNOSIS — I252 Old myocardial infarction: Secondary | ICD-10-CM | POA: Diagnosis not present

## 2016-04-17 DIAGNOSIS — R0902 Hypoxemia: Secondary | ICD-10-CM

## 2016-04-17 DIAGNOSIS — I10 Essential (primary) hypertension: Secondary | ICD-10-CM | POA: Diagnosis present

## 2016-04-17 DIAGNOSIS — R0602 Shortness of breath: Secondary | ICD-10-CM | POA: Diagnosis present

## 2016-04-17 DIAGNOSIS — J9621 Acute and chronic respiratory failure with hypoxia: Secondary | ICD-10-CM | POA: Diagnosis present

## 2016-04-17 DIAGNOSIS — R0600 Dyspnea, unspecified: Secondary | ICD-10-CM

## 2016-04-17 DIAGNOSIS — I2510 Atherosclerotic heart disease of native coronary artery without angina pectoris: Secondary | ICD-10-CM | POA: Diagnosis present

## 2016-04-17 LAB — CBC WITH DIFFERENTIAL/PLATELET
Basophils Absolute: 0 10*3/uL (ref 0–0.1)
Basophils Relative: 0 %
Eosinophils Absolute: 0 10*3/uL (ref 0–0.7)
Eosinophils Relative: 1 %
HCT: 32.4 % — ABNORMAL LOW (ref 35.0–47.0)
Hemoglobin: 10.6 g/dL — ABNORMAL LOW (ref 12.0–16.0)
Lymphocytes Relative: 21 %
Lymphs Abs: 1.2 10*3/uL (ref 1.0–3.6)
MCH: 26.2 pg (ref 26.0–34.0)
MCHC: 32.8 g/dL (ref 32.0–36.0)
MCV: 80 fL (ref 80.0–100.0)
Monocytes Absolute: 0.9 10*3/uL (ref 0.2–0.9)
Monocytes Relative: 16 %
Neutro Abs: 3.5 10*3/uL (ref 1.4–6.5)
Neutrophils Relative %: 62 %
Platelets: 222 10*3/uL (ref 150–440)
RBC: 4.05 MIL/uL (ref 3.80–5.20)
RDW: 17 % — ABNORMAL HIGH (ref 11.5–14.5)
WBC: 5.7 10*3/uL (ref 3.6–11.0)

## 2016-04-17 LAB — COMPREHENSIVE METABOLIC PANEL
ALT: 28 U/L (ref 14–54)
AST: 33 U/L (ref 15–41)
Albumin: 3.8 g/dL (ref 3.5–5.0)
Alkaline Phosphatase: 69 U/L (ref 38–126)
Anion gap: 9 (ref 5–15)
BUN: 18 mg/dL (ref 6–20)
CO2: 26 mmol/L (ref 22–32)
Calcium: 8.5 mg/dL — ABNORMAL LOW (ref 8.9–10.3)
Chloride: 100 mmol/L — ABNORMAL LOW (ref 101–111)
Creatinine, Ser: 1.05 mg/dL — ABNORMAL HIGH (ref 0.44–1.00)
GFR calc Af Amer: 57 mL/min — ABNORMAL LOW (ref >60)
GFR calc non Af Amer: 49 mL/min — ABNORMAL LOW (ref >60)
Glucose, Bld: 88 mg/dL (ref 65–99)
Potassium: 3.5 mmol/L (ref 3.5–5.1)
Sodium: 135 mmol/L (ref 135–145)
Total Bilirubin: 0.5 mg/dL (ref 0.3–1.2)
Total Protein: 6.8 g/dL (ref 6.5–8.1)

## 2016-04-17 LAB — EXPECTORATED SPUTUM ASSESSMENT W GRAM STAIN, RFLX TO RESP C: Special Requests: NORMAL

## 2016-04-17 LAB — PROTIME-INR
INR: 0.96
Prothrombin Time: 12.8 seconds (ref 11.4–15.2)

## 2016-04-17 LAB — EXPECTORATED SPUTUM ASSESSMENT W REFEX TO RESP CULTURE

## 2016-04-17 LAB — INFLUENZA PANEL BY PCR (TYPE A & B)
Influenza A By PCR: NEGATIVE
Influenza B By PCR: NEGATIVE

## 2016-04-17 LAB — LACTIC ACID, PLASMA: Lactic Acid, Venous: 1 mmol/L (ref 0.5–1.9)

## 2016-04-17 LAB — TROPONIN I: Troponin I: 0.03 ng/mL (ref <0.03)

## 2016-04-17 LAB — BRAIN NATRIURETIC PEPTIDE: B Natriuretic Peptide: 120 pg/mL — ABNORMAL HIGH (ref 0.0–100.0)

## 2016-04-17 MED ORDER — CALCIUM CARBONATE-VITAMIN D 500-200 MG-UNIT PO TABS
1.0000 | ORAL_TABLET | Freq: Every day | ORAL | Status: DC
  Administered 2016-04-18 – 2016-04-19 (×2): 1 via ORAL
  Filled 2016-04-17 (×2): qty 1

## 2016-04-17 MED ORDER — SENNA 8.6 MG PO TABS
2.0000 | ORAL_TABLET | Freq: Every day | ORAL | Status: DC
  Filled 2016-04-17: qty 2

## 2016-04-17 MED ORDER — LEVOFLOXACIN IN D5W 250 MG/50ML IV SOLN
250.0000 mg | INTRAVENOUS | Status: DC
  Filled 2016-04-17: qty 50

## 2016-04-17 MED ORDER — RISEDRONATE SODIUM 150 MG PO TABS: 150.0000 mg | ORAL_TABLET | ORAL | Status: DC

## 2016-04-17 MED ORDER — IPRATROPIUM-ALBUTEROL 0.5-2.5 (3) MG/3ML IN SOLN
3.0000 mL | RESPIRATORY_TRACT | Status: DC
  Administered 2016-04-17 – 2016-04-18 (×4): 3 mL via RESPIRATORY_TRACT
  Filled 2016-04-17 (×4): qty 3

## 2016-04-17 MED ORDER — ASPIRIN EC 81 MG PO TBEC
81.0000 mg | DELAYED_RELEASE_TABLET | Freq: Every day | ORAL | Status: DC
  Administered 2016-04-18 – 2016-04-19 (×2): 81 mg via ORAL
  Filled 2016-04-17 (×2): qty 1

## 2016-04-17 MED ORDER — BENAZEPRIL HCL 20 MG PO TABS
20.0000 mg | ORAL_TABLET | Freq: Every day | ORAL | Status: DC
  Administered 2016-04-17 – 2016-04-19 (×3): 20 mg via ORAL
  Filled 2016-04-17 (×4): qty 1

## 2016-04-17 MED ORDER — ACETAMINOPHEN 325 MG PO TABS
650.0000 mg | ORAL_TABLET | Freq: Once | ORAL | Status: AC
  Administered 2016-04-17: 650 mg via ORAL
  Filled 2016-04-17: qty 2

## 2016-04-17 MED ORDER — VITAMIN B-1 100 MG PO TABS
100.0000 mg | ORAL_TABLET | Freq: Every day | ORAL | Status: DC
  Administered 2016-04-18 – 2016-04-19 (×2): 100 mg via ORAL
  Filled 2016-04-17 (×2): qty 1

## 2016-04-17 MED ORDER — METHYLPREDNISOLONE SODIUM SUCC 125 MG IJ SOLR
125.0000 mg | Freq: Once | INTRAMUSCULAR | Status: AC
  Administered 2016-04-17: 125 mg via INTRAVENOUS
  Filled 2016-04-17: qty 2

## 2016-04-17 MED ORDER — ATORVASTATIN CALCIUM 10 MG PO TABS
10.0000 mg | ORAL_TABLET | Freq: Every day | ORAL | Status: DC
  Administered 2016-04-17 – 2016-04-19 (×3): 10 mg via ORAL
  Filled 2016-04-17 (×3): qty 1

## 2016-04-17 MED ORDER — LEVOFLOXACIN IN D5W 500 MG/100ML IV SOLN
500.0000 mg | Freq: Once | INTRAVENOUS | Status: AC
  Administered 2016-04-18: 500 mg via INTRAVENOUS
  Filled 2016-04-17: qty 100

## 2016-04-17 MED ORDER — METHYLPREDNISOLONE SODIUM SUCC 125 MG IJ SOLR
60.0000 mg | Freq: Four times a day (QID) | INTRAMUSCULAR | Status: DC
  Administered 2016-04-18 – 2016-04-19 (×5): 60 mg via INTRAVENOUS
  Filled 2016-04-17 (×5): qty 2

## 2016-04-17 MED ORDER — HEPARIN SODIUM (PORCINE) 5000 UNIT/ML IJ SOLN
5000.0000 [IU] | Freq: Three times a day (TID) | INTRAMUSCULAR | Status: DC
  Administered 2016-04-17 – 2016-04-19 (×6): 5000 [IU] via SUBCUTANEOUS
  Filled 2016-04-17 (×7): qty 1

## 2016-04-17 MED ORDER — PANTOPRAZOLE SODIUM 40 MG PO TBEC
40.0000 mg | DELAYED_RELEASE_TABLET | Freq: Every day | ORAL | Status: DC
  Administered 2016-04-18 – 2016-04-20 (×5): 40 mg via ORAL
  Filled 2016-04-17 (×5): qty 1

## 2016-04-17 MED ORDER — TIOTROPIUM BROMIDE MONOHYDRATE 18 MCG IN CAPS
18.0000 ug | ORAL_CAPSULE | Freq: Every day | RESPIRATORY_TRACT | Status: DC
  Administered 2016-04-17 – 2016-04-20 (×3): 18 ug via RESPIRATORY_TRACT
  Filled 2016-04-17: qty 5

## 2016-04-17 MED ORDER — VITAMIN E 180 MG (400 UNIT) PO CAPS
400.0000 [IU] | ORAL_CAPSULE | Freq: Every day | ORAL | Status: DC
  Administered 2016-04-18 – 2016-04-19 (×2): 400 [IU] via ORAL
  Filled 2016-04-17 (×3): qty 1

## 2016-04-17 MED ORDER — CLOPIDOGREL BISULFATE 75 MG PO TABS
75.0000 mg | ORAL_TABLET | Freq: Every day | ORAL | Status: DC
  Administered 2016-04-17 – 2016-04-19 (×3): 75 mg via ORAL
  Filled 2016-04-17 (×3): qty 1

## 2016-04-17 MED ORDER — METOPROLOL SUCCINATE ER 50 MG PO TB24
25.0000 mg | ORAL_TABLET | Freq: Every day | ORAL | Status: DC
  Administered 2016-04-17 – 2016-04-19 (×3): 25 mg via ORAL
  Filled 2016-04-17 (×4): qty 1

## 2016-04-17 MED ORDER — SODIUM CHLORIDE 0.9 % IV BOLUS (SEPSIS)
1000.0000 mL | Freq: Once | INTRAVENOUS | Status: AC
  Administered 2016-04-17: 1000 mL via INTRAVENOUS

## 2016-04-17 MED ORDER — NITROGLYCERIN 0.4 MG SL SUBL: 0.4000 mg | SUBLINGUAL_TABLET | SUBLINGUAL | Status: DC | PRN

## 2016-04-17 MED ORDER — MOMETASONE FURO-FORMOTEROL FUM 200-5 MCG/ACT IN AERO
2.0000 | INHALATION_SPRAY | Freq: Two times a day (BID) | RESPIRATORY_TRACT | Status: DC
  Administered 2016-04-17 – 2016-04-20 (×6): 2 via RESPIRATORY_TRACT
  Filled 2016-04-17: qty 8.8

## 2016-04-17 MED ORDER — CHLORPHENIRAMINE MALEATE 4 MG PO TABS
4.0000 mg | ORAL_TABLET | Freq: Every day | ORAL | Status: DC | PRN
  Filled 2016-04-17: qty 1

## 2016-04-17 MED ORDER — ALBUTEROL SULFATE (2.5 MG/3ML) 0.083% IN NEBU
5.0000 mg | INHALATION_SOLUTION | Freq: Once | RESPIRATORY_TRACT | Status: AC
  Administered 2016-04-17: 5 mg via RESPIRATORY_TRACT
  Filled 2016-04-17: qty 6

## 2016-04-17 NOTE — Progress Notes (Signed)
ANTIBIOTIC CONSULT NOTE - INITIAL  Pharmacy Consult for Levaquin  Indication: bronchitis  Allergies  Allergen Reactions  . Penicillins Other (See Comments)    Reaction:  Unknown  Has patient had a PCN reaction causing immediate rash, facial/tongue/throat swelling, SOB or lightheadedness with hypotension: unknown Has patient had a PCN reaction causing severe rash involving mucus membranes or skin necrosis: unknown Has patient had a PCN reaction that required hospitalization No Has patient had a PCN reaction occurring within the last 10 years: No If all of the above answers are "NO", then may proceed with Cephalosporin use.   . Augmentin [Amoxicillin-Pot Clavulanate] Other (See Comments)    Reaction:  Unknown   . Indocin [Indomethacin] Other (See Comments)    Reaction:  Unknown   . Lodine [Etodolac] Other (See Comments)    Reaction:   GI upset   . Methotrexate Derivatives Other (See Comments)    Reaction:  GI upset   . Gold Rash  . Zantac [Ranitidine Hcl] Rash    Patient Measurements: Height: 5' 7"  (170.2 cm) Weight: 188 lb 1.6 oz (85.3 kg) IBW/kg (Calculated) : 61.6 Adjusted Body Weight:   Vital Signs: Temp: 101.4 F (38.6 C) (02/09 1533) Temp Source: Oral (02/09 1533) BP: 126/63 (02/09 1533) Pulse Rate: 106 (02/09 1533) Intake/Output from previous day: No intake/output data recorded. Intake/Output from this shift: No intake/output data recorded.  Labs:  Recent Labs  04/17/16 1601  WBC 5.7  HGB 10.6*  PLT 222  CREATININE 1.05*   Estimated Creatinine Clearance: 48.8 mL/min (by C-G formula based on SCr of 1.05 mg/dL (H)). No results for input(s): VANCOTROUGH, VANCOPEAK, VANCORANDOM, GENTTROUGH, GENTPEAK, GENTRANDOM, TOBRATROUGH, TOBRAPEAK, TOBRARND, AMIKACINPEAK, AMIKACINTROU, AMIKACIN in the last 72 hours.   Microbiology: No results found for this or any previous visit (from the past 720 hour(s)).  Medical History: Past Medical History:  Diagnosis Date  .  Anemia   . Arthritis   . Asthma   . Complication of anesthesia   . COPD (chronic obstructive pulmonary disease) ([place])   . Crohn's disease ([place])   . Depression    after death of husband  . Hyperlipidemia   . Hypertension   . Myocardial infarction   . Osteopenia   . Peptic ulcer disease   . Pneumonia   . PONV (postoperative nausea and vomiting)     Medications:   (Not in a hospital admission) Assessment: CrCl = 48.8 ml/min   Goal of Therapy:  resolution of infection  Plan:  Expected duration 7 days with resolution of temperature and/or normalization of WBC   Levaquin 500 mg IV X 1 ordered to be given on 2/9 followed by levaquin 250 mg IV Q24H to start on 2/10 @ 18:00.   Raphael Espe D 04/17/2016,5:44 PM

## 2016-04-17 NOTE — ED Triage Notes (Signed)
Pt sent from PCP for low sats. Pt does not wear O2 during day, only 2 L at night.  Has had cough for 3 days with yellow and green sputum. Febrile in triage.  Denies any pain. C/o some SHOB.

## 2016-04-17 NOTE — Progress Notes (Signed)

## 2016-04-17 NOTE — ED Provider Notes (Addendum)
[facility] Emergency Department Provider Note  ____________________________________________   I have reviewed the triage vital signs and the nursing notes.   HISTORY  Chief Complaint Shortness of Breath    HPI Raven [name] is a 82 y.o. female  presents today complaining of cough fever and shortness of breath. These been going on for 4 days. Patient does have a history of COPD he is on home oxygen only at night but has had to use her home oxygen during the day. She does have a history of CAP. She denies any abdominal pain vomiting or diarrhea. She's had poor by mouth intake. She denies chest pain or leg swelling.   Past Medical History:  Diagnosis Date  . Anemia   . Arthritis   . Asthma   . Complication of anesthesia   . COPD (chronic obstructive pulmonary disease) ([place])   . Crohn's disease ([employer])   . Depression    after death of husband  . Hyperlipidemia   . Hypertension   . Myocardial infarction   . Osteopenia   . Peptic ulcer disease   . Pneumonia   . PONV (postoperative nausea and vomiting)     Patient Active Problem List   Diagnosis Date Noted  . Peroneal tendonitis 07/18/2015  . Pneumonia 04/19/2015  . CAP (community acquired pneumonia) 11/02/2014    Past Surgical History:  Procedure Laterality Date  . ABDOMINAL SURGERY    . CARDIAC CATHETERIZATION     with stent  . COLONOSCOPY WITH PROPOFOL N/A 09/27/2014   Procedure: COLONOSCOPY WITH PROPOFOL;  Surgeon: [name], MD;  Location: [facility] ENDOSCOPY;  Service: Gastroenterology;  Laterality: N/A;  . EYE SURGERY     bilateral cataract surgeries  . SHOULDER SURGERY Right    x 2   . SMALL INTESTINE SURGERY      Prior to Admission medications   Medication Sig Start Date End Date Taking? Authorizing Provider  acetaminophen (TYLENOL) 500 MG tablet Take 1,000 mg by mouth See admin instructions. Takes 2 tablets at night and during the day only if needed for pain    Historical Provider, MD   Adalimumab (HUMIRA) 40 MG/0.8ML PSKT Inject 40 mg into the skin every 14 (fourteen) days. Next dose due 04-10-2016    Historical Provider, MD  albuterol (PROVENTIL HFA;VENTOLIN HFA) 108 (90 BASE) MCG/ACT inhaler Inhale 2 puffs into the lungs every 6 (six) hours as needed for wheezing or shortness of breath.    Historical Provider, MD  amLODipine-benazepril (LOTREL) 5-20 MG per capsule Take 1 capsule by mouth daily.     Historical Provider, MD  aspirin EC 81 MG tablet Take 81 mg by mouth daily.    Historical Provider, MD  atorvastatin (LIPITOR) 10 MG tablet Take 10 mg by mouth at bedtime.    Historical Provider, MD  Calcium-Phosphorus-Vitamin D (CITRACAL +D3 PO) Take 1 tablet by mouth daily.    Historical Provider, MD  Chlorpheniramine Maleate (ALLERGY PO) Take 1 tablet by mouth daily.    Historical Provider, MD  clopidogrel (PLAVIX) 75 MG tablet Take 75 mg by mouth daily.    Historical Provider, MD  Cyanocobalamin (B-12 PO) Take 1 tablet by mouth daily. Triple B-12    Historical Provider, MD  esomeprazole (NEXIUM) 20 MG capsule Take 20 mg by mouth daily at 12 noon.    Historical Provider, MD  Fluticasone-Salmeterol (ADVAIR) 250-50 MCG/DOSE AEPB Inhale 1 puff into the lungs 2 (two) times daily.    Historical Provider, MD  hydrochlorothiazide (  HYDRODIURIL) 25 MG tablet Take 25 mg by mouth daily.     Historical Provider, MD  metoprolol succinate (TOPROL-XL) 25 MG 24 hr tablet Take 25 mg by mouth daily.    Historical Provider, MD  Multiple Vitamin (MULTIVITAMIN WITH MINERALS) TABS tablet Take 1 tablet by mouth daily.    Historical Provider, MD  nitroGLYCERIN (NITROSTAT) 0.4 MG SL tablet Place 0.4 mg under the tongue every 5 (five) minutes as needed for chest pain.    Historical Provider, MD  potassium chloride (K-DUR,KLOR-CON) 10 MEQ tablet Take 10 mEq by mouth 2 (two) times daily.    Historical Provider, MD  predniSONE (DELTASONE) 1 MG tablet Take 2 mg by mouth at bedtime.     Historical Provider, MD   predniSONE (DELTASONE) 5 MG tablet Take 5 mg by mouth daily with breakfast.    Historical Provider, MD  risedronate (ACTONEL) 150 MG tablet Take 150 mg by mouth every 30 (thirty) days. with water on empty stomach, nothing by mouth or lie down for next 30 minutes.    Historical Provider, MD  senna (SENOKOT) 8.6 MG tablet Take 2 tablets by mouth at bedtime.     Historical Provider, MD  thiamine (VITAMIN B-1) 100 MG tablet Take 100 mg by mouth daily.    Historical Provider, MD  vitamin E 400 UNIT capsule Take 400 Units by mouth daily.    Historical Provider, MD    Allergies Penicillins; Augmentin [amoxicillin-pot clavulanate]; Indocin [indomethacin]; Lodine [etodolac]; Methotrexate derivatives; Gold; and Zantac [ranitidine hcl]  Family History  Problem Relation Age of Onset  . Hypertension      Social History Social History  Substance Use Topics  . Smoking status: Former Smoker    Quit date: 1974  . Smokeless tobacco: Never Used  . Alcohol use Yes     Comment: occassional    Review of Systems Constitutional: + fever/chills Eyes: No visual changes. ENT: No sore throat. No stiff neck no neck pain Cardiovascular: Denies chest pain. Respiratory: + shortness of breath. Gastrointestinal:   no vomiting.  No diarrhea.  No constipation. Genitourinary: Negative for dysuria. Musculoskeletal: Negative lower extremity swelling Skin: Negative for rash. Neurological: Negative for severe headaches, focal weakness or numbness. 10-point ROS otherwise negative.  ____________________________________________   PHYSICAL EXAM:  VITAL SIGNS: ED Triage Vitals  Enc Vitals Group     BP 04/17/16 1533 126/63     Pulse Rate 04/17/16 1533 (!) 106     Resp 04/17/16 1533 (!) 28     Temp 04/17/16 1533 (!) 101.4 F (38.6 C)     Temp Source 04/17/16 1533 Oral     SpO2 04/17/16 1533 (!) 87 %     Weight 04/17/16 1532 180 lb (81.6 kg)     Height 04/17/16 1532 5' 7"  (1.702 m)     Head Circumference --       Peak Flow --      Pain Score --      Pain Loc --      Pain Edu? --      Excl. in [place]? --     Constitutional: Alert and oriented. Well appearing and in no acute distress. Eyes: Conjunctivae are normal. PERRL. EOMI. Head: Atraumatic. Nose: No congestion/rhinnorhea. Mouth/Throat: Mucous membranes are Dry.  Oropharynx non-erythematous. Neck: No stridor.   Nontender with no meningismus Cardiovascular: Normal rate, regular rhythm. Grossly normal heart sounds.  Good peripheral circulation. Respiratory: Patient with cough and asked her to take a deep breath however there is  appreciated bilateral rhonchi which some degree clear with cough no significant wheeze, diminished in the bases. Abdominal: Soft and nontender. No distention. No guarding no rebound Back:  There is no focal tenderness or step off.  there is no midline tenderness there are no lesions noted. there is no CVA tenderness   Musculoskeletal: No lower extremity tenderness, no upper extremity tenderness. No joint effusions, no DVT signs strong distal pulses no edema Neurologic:  Normal speech and language. No gross focal neurologic deficits are appreciated.  Skin:  Skin is warm, dry and intact. No rash noted. Psychiatric: Mood and affect are normal. Speech and behavior are normal.  ____________________________________________   LABS (all labs ordered are listed, but only abnormal results are displayed)  Labs Reviewed  CULTURE, BLOOD (ROUTINE X 2)  CULTURE, BLOOD (ROUTINE X 2)  COMPREHENSIVE METABOLIC PANEL  LACTIC ACID, PLASMA  LACTIC ACID, PLASMA  CBC WITH DIFFERENTIAL/PLATELET  PROTIME-INR  URINALYSIS, COMPLETE (UACMP) WITH MICROSCOPIC  INFLUENZA PANEL BY PCR (TYPE A & B)   ____________________________________________  EKG  I personally interpreted any EKGs ordered by me or triageSinus rhythm at 95 beats minute positive lady no acute ST elevation or depression  ____________________________________________  RADIOLOGY  I reviewed any imaging ordered by me or triage that were performed during my shift and, if possible, patient and/or family made aware of any abnormal findings. ____________________________________________   PROCEDURES  Procedure(s) performed: None  Procedures  Critical Care performed: CRITICAL CARE Performed by: Schuyler Amor   Total critical care time: 35 minutes elderly patient with hypoxia respiratory distress Critical care time was exclusive of separately billable procedures and treating other patients.  Critical care was necessary to treat or prevent imminent or life-threatening deterioration.  Critical care was time spent personally by me on the following activities: development of treatment plan with patient and/or surrogate as well as nursing, discussions with consultants, evaluation of patient's response to treatment, examination of patient, obtaining history from patient or surrogate, ordering and performing treatments and interventions, ordering and review of laboratory studies, ordering and review of radiographic studies, pulse oximetry and re-evaluation of patient's condition.   ____________________________________________   INITIAL IMPRESSION / ASSESSMENT AND PLAN / ED COURSE  Pertinent labs & imaging results that were available during my care of the patient were reviewed by me and considered in my medical decision making (see chart for details).  Patient with fever and cough with a HIDA flu season as well as increased oxygen requirement. Most likely this is influenza we will certainly check that as well as other markers of infection including chest x-ray and we will reassess.  ----------------------------------------- 5:31 PM on 04/17/2016 -----------------------------------------  No evidence of influenza or sepsis. This patient is pen allergic we will have to give her something to cover atypical  infection including fluoroquinolones. We will give her Solu-Medrol. We have discussed with hospitalist they will admit.    ____________________________________________   FINAL CLINICAL IMPRESSION(S) / ED DIAGNOSES  Final diagnoses:  None      This chart was dictated using voice recognition software.  Despite best efforts to proofread,  errors can occur which can change meaning.      Schuyler Amor, MD 04/17/16 [place], MD 04/17/16 [phone]

## 2016-04-17 NOTE — H&P (Signed)
[facility] [place] NAME: Raven [name]    MR#:  [id number]  DATE OF BIRTH:  June 11, 1934  DATE OF ADMISSION:  04/17/2016  PRIMARY CARE PHYSICIAN: [name], MD   REQUESTING/REFERRING PHYSICIAN:   CHIEF COMPLAINT:   Chief Complaint  Patient presents with  . Shortness of Breath    HISTORY OF PRESENT ILLNESS: Raven [name]  is a 82 y.o. female with a known history of COPD on home oxygen, , depression, hyperlipidemia, hypertension, myocardial infarction, pneumonia- was feeling short of breath and cough and went to her primary care doctor's office and was given cefuroxime 2 days ago. She did not improve any and continued to get worse so came to emergency room back again. She is hypoxic on room air and have fever with tachycardia and tachypnea. Influenza test is negative. Chest x-ray suggestive of bronchitis.  PAST MEDICAL HISTORY:   Past Medical History:  Diagnosis Date  . Anemia   . Arthritis   . Asthma   . Complication of anesthesia   . COPD (chronic obstructive pulmonary disease) ([place])   . Crohn's disease ([place])   . Depression    after death of husband  . Hyperlipidemia   . Hypertension   . Myocardial infarction   . Osteopenia   . Peptic ulcer disease   . Pneumonia   . PONV (postoperative nausea and vomiting)     PAST SURGICAL HISTORY: Past Surgical History:  Procedure Laterality Date  . ABDOMINAL SURGERY    . CARDIAC CATHETERIZATION     with stent  . COLONOSCOPY WITH PROPOFOL N/A 09/27/2014   Procedure: COLONOSCOPY WITH PROPOFOL;  Surgeon: [name], MD;  Location: [facility] ENDOSCOPY;  Service: Gastroenterology;  Laterality: N/A;  . EYE SURGERY     bilateral cataract surgeries  . SHOULDER SURGERY Right    x 2   . SMALL INTESTINE SURGERY      SOCIAL HISTORY:  Social History  Substance Use Topics  . Smoking status: Former Smoker    Quit date: 1974  . Smokeless tobacco: Never Used  . Alcohol use Yes     Comment:  occassional    FAMILY HISTORY:  Family History  Problem Relation Age of Onset  . Hypertension      DRUG ALLERGIES:  Allergies  Allergen Reactions  . Penicillins Other (See Comments)    Reaction:  Unknown  Has patient had a PCN reaction causing immediate rash, facial/tongue/throat swelling, SOB or lightheadedness with hypotension: unknown Has patient had a PCN reaction causing severe rash involving mucus membranes or skin necrosis: unknown Has patient had a PCN reaction that required hospitalization No Has patient had a PCN reaction occurring within the last 10 years: No If all of the above answers are "NO", then may proceed with Cephalosporin use.   . Augmentin [Amoxicillin-Pot Clavulanate] Other (See Comments)    Reaction:  Unknown   . Indocin [Indomethacin] Other (See Comments)    Reaction:  Unknown   . Lodine [Etodolac] Other (See Comments)    Reaction:   GI upset   . Methotrexate Derivatives Other (See Comments)    Reaction:  GI upset   . Gold Rash  . Zantac [Ranitidine Hcl] Rash    REVIEW OF SYSTEMS:   CONSTITUTIONAL: Positive for fever, fatigue or weakness.  EYES: No blurred or double vision.  EARS, NOSE, AND THROAT: No tinnitus or ear pain.  RESPIRATORY: Positive for cough, shortness of breath, wheezing , no hemoptysis.  CARDIOVASCULAR: No chest pain, orthopnea, edema.  GASTROINTESTINAL: No nausea, vomiting, diarrhea or abdominal pain.  GENITOURINARY: No dysuria, hematuria.  ENDOCRINE: No polyuria, nocturia,  HEMATOLOGY: No anemia, easy bruising or bleeding SKIN: No rash or lesion. MUSCULOSKELETAL: No joint pain or arthritis.   NEUROLOGIC: No tingling, numbness, weakness.  PSYCHIATRY: No anxiety or depression.   MEDICATIONS AT HOME:  Prior to Admission medications   Medication Sig Start Date End Date Taking? Authorizing Provider  acetaminophen (TYLENOL) 500 MG tablet Take 1,000 mg by mouth See admin instructions. Takes 2 tablets at night and during the day  only if needed for pain   Yes Historical Provider, MD  Adalimumab (HUMIRA) 40 MG/0.8ML PSKT Inject 40 mg into the skin every 14 (fourteen) days.    Yes Historical Provider, MD  albuterol (PROVENTIL HFA;VENTOLIN HFA) 108 (90 BASE) MCG/ACT inhaler Inhale 2 puffs into the lungs every 6 (six) hours as needed for wheezing or shortness of breath.   Yes Historical Provider, MD  amLODipine-benazepril (LOTREL) 5-20 MG per capsule Take 1 capsule by mouth daily.    Yes Historical Provider, MD  aspirin EC 81 MG tablet Take 81 mg by mouth daily.   Yes Historical Provider, MD  atorvastatin (LIPITOR) 10 MG tablet Take 10 mg by mouth at bedtime.   Yes Historical Provider, MD  Calcium-Phosphorus-Vitamin D (CITRACAL +D3 PO) Take 1 tablet by mouth daily.   Yes Historical Provider, MD  Chlorpheniramine Maleate (ALLERGY PO) Take 1 tablet by mouth daily.    Historical Provider, MD  clopidogrel (PLAVIX) 75 MG tablet Take 75 mg by mouth daily.    Historical Provider, MD  Cyanocobalamin (B-12 PO) Take 1 tablet by mouth daily. Triple B-12    Historical Provider, MD  esomeprazole (NEXIUM) 20 MG capsule Take 20 mg by mouth daily at 12 noon.    Historical Provider, MD  Fluticasone-Salmeterol (ADVAIR) 250-50 MCG/DOSE AEPB Inhale 1 puff into the lungs 2 (two) times daily.    Historical Provider, MD  hydrochlorothiazide (HYDRODIURIL) 25 MG tablet Take 25 mg by mouth daily.     Historical Provider, MD  metoprolol succinate (TOPROL-XL) 25 MG 24 hr tablet Take 25 mg by mouth daily.    Historical Provider, MD  Multiple Vitamin (MULTIVITAMIN WITH MINERALS) TABS tablet Take 1 tablet by mouth daily.    Historical Provider, MD  nitroGLYCERIN (NITROSTAT) 0.4 MG SL tablet Place 0.4 mg under the tongue every 5 (five) minutes as needed for chest pain.    Historical Provider, MD  potassium chloride (K-DUR,KLOR-CON) 10 MEQ tablet Take 10 mEq by mouth 2 (two) times daily.    Historical Provider, MD  predniSONE (DELTASONE) 1 MG tablet Take 2 mg  by mouth at bedtime.     Historical Provider, MD  predniSONE (DELTASONE) 5 MG tablet Take 5 mg by mouth daily with breakfast.    Historical Provider, MD  risedronate (ACTONEL) 150 MG tablet Take 150 mg by mouth every 30 (thirty) days. with water on empty stomach, nothing by mouth or lie down for next 30 minutes.    Historical Provider, MD  senna (SENOKOT) 8.6 MG tablet Take 2 tablets by mouth at bedtime.     Historical Provider, MD  thiamine (VITAMIN B-1) 100 MG tablet Take 100 mg by mouth daily.    Historical Provider, MD  vitamin E 400 UNIT capsule Take 400 Units by mouth daily.    Historical Provider, MD      PHYSICAL EXAMINATION:   VITAL SIGNS: Blood pressure 126/63, pulse [name])  106, temperature (!) 101.4 F (38.6 C), temperature source Oral, resp. rate (!) 28, height 5' 7"  (1.702 m), weight 85.3 kg (188 lb 1.6 oz), SpO2 94 %.  GENERAL:  82 y.o.-year-old patient lying in the bed with no acute distress.  EYES: Pupils equal, round, reactive to light and accommodation. No scleral icterus. Extraocular muscles intact.  HEENT: Head atraumatic, normocephalic. Oropharynx and nasopharynx clear.  NECK:  Supple, no jugular venous distention. No thyroid enlargement, no tenderness.  LUNGS: Normal breath sounds bilaterally, some wheezing, some crepitation. Positive use of accessory muscles of respiration.  CARDIOVASCULAR: S1, S2 normal. No murmurs, rubs, or gallops.  ABDOMEN: Soft, nontender, nondistended. Bowel sounds present. No organomegaly or mass.  EXTREMITIES: No pedal edema, cyanosis, or clubbing.  NEUROLOGIC: Cranial nerves II through XII are intact. Muscle strength 5/5 in all extremities. Sensation intact. Gait not checked.  PSYCHIATRIC: The patient is alert and oriented x 3.  SKIN: No obvious rash, lesion, or ulcer.   LABORATORY PANEL:   CBC  Recent Labs Lab 04/13/16 1157 04/17/16 1601  WBC 9.2 5.7  HGB 10.6* 10.6*  HCT 31.9* 32.4*  PLT 292 222  MCV 80.7 80.0  MCH 26.8 26.2   MCHC 33.2 32.8  RDW 16.4* 17.0*  LYMPHSABS 1.0 1.2  MONOABS 1.4* 0.9  EOSABS 0.1 0.0  BASOSABS 0.0 0.0   ------------------------------------------------------------------------------------------------------------------  Chemistries   Recent Labs Lab 04/13/16 1157 04/17/16 1601  NA 142 135  K 3.6 3.5  CL 108 100*  CO2 26 26  GLUCOSE 102* 88  BUN 20 18  CREATININE 0.88 1.05*  CALCIUM 8.9 8.5*  AST  --  33  ALT  --  28  ALKPHOS  --  69  BILITOT  --  0.5   ------------------------------------------------------------------------------------------------------------------ estimated creatinine clearance is 48.8 mL/min (by C-G formula based on SCr of 1.05 mg/dL (H)). ------------------------------------------------------------------------------------------------------------------ No results for input(s): TSH, T4TOTAL, T3FREE, THYROIDAB in the last 72 hours.  Invalid input(s): FREET3   Coagulation profile  Recent Labs Lab 04/17/16 1601  INR 0.96   ------------------------------------------------------------------------------------------------------------------- No results for input(s): DDIMER in the last 72 hours. -------------------------------------------------------------------------------------------------------------------  Cardiac Enzymes No results for input(s): CKMB, TROPONINI, MYOGLOBIN in the last 168 hours.  Invalid input(s): CK ------------------------------------------------------------------------------------------------------------------ Invalid input(s): POCBNP  ---------------------------------------------------------------------------------------------------------------  Urinalysis    Component Value Date/Time   COLORURINE YELLOW (A) 04/20/2015 0248   APPEARANCEUR CLEAR (A) 04/20/2015 0248   APPEARANCEUR Clear 06/08/2014 2052   LABSPEC 1.006 04/20/2015 0248   LABSPEC 1.015 06/08/2014 2052   PHURINE 6.0 04/20/2015 0248   GLUCOSEU NEGATIVE  04/20/2015 0248   GLUCOSEU Negative 06/08/2014 2052   HGBUR NEGATIVE 04/20/2015 0248   BILIRUBINUR NEGATIVE 04/20/2015 0248   BILIRUBINUR Negative 06/08/2014 2052   KETONESUR 1+ (A) 04/20/2015 0248   PROTEINUR NEGATIVE 04/20/2015 0248   NITRITE NEGATIVE 04/20/2015 0248   LEUKOCYTESUR NEGATIVE 04/20/2015 0248   LEUKOCYTESUR Negative 06/08/2014 2052     RADIOLOGY: Dg Chest 2 View  Result Date: 04/17/2016 CLINICAL DATA:  Cough, sick for 3 days EXAM: CHEST  2 VIEW COMPARISON:  Chest CT 06/19/2015, CXR 02/18/2016 FINDINGS: Chronic elevation of right hemidiaphragm. Diffuse interstitial prominence bilaterally which may reflect bronchitic change. No pneumonic consolidation, effusion or pulmonary edema. Aortic atherosclerosis without aneurysm. Top normal size cardiac size. Old right-sided posterior eighth rib fracture. IMPRESSION: Mild diffuse interstitial prominence bilaterally consistent bronchitic change. Chronic elevation the right hemidiaphragm. Electronically Signed   By: Ashley Royalty M.D.   On: 04/17/2016 16:41    EKG: Orders  placed or performed during the hospital encounter of 04/17/16  . ED EKG  . ED EKG    IMPRESSION AND PLAN:  * Sepsis   Acute on chronic hypoxic respiratory failure   Acute COPD exacerbation   Acute bronchitis    Influenza test negative.   IV Solu-Medrol, DuoNeb, continue supplemental oxygen.   Patient was given oral cefuroxime time, did not help much.   We will give Levaquin for now.   Try to get sputum culture.  * Hypertension   Continue home meds except IW thiazide.  * Coronary artery disease   Continue aspirin, Plavix, metoprolol, statin.  * Hyperlipidemia   Continue statin.   All the records are reviewed and case discussed with ED provider. Management plans discussed with the patient, family and they are in agreement.  CODE STATUS: full. Code Status History    Date Active Date Inactive Code Status Order ID Comments User Context   04/19/2015   8:24 PM 04/21/2015  5:14 PM Full Code [id number]  Dustin Flock, MD Inpatient   11/02/2014 12:02 PM 11/08/2014  5:41 PM Full Code [id number]  Bettey Costa, MD Inpatient      Patient's son was present in the room. Her healthcare power of attorney is her daughter.  TOTAL TIME TAKING CARE OF THIS PATIENT: 45 minutes.    Vaughan Basta M.D on 04/17/2016   Between 7am to 6pm - Pager - [phone]  After 6pm go to [email or web address] - password [email or web address] [facility]  Office  [phone]  CC: Primary care physician; [name], MD   Note: This dictation was prepared with Dragon dictation along with smaller phrase technology. Any transcriptional errors that result from this process are unintentional.

## 2016-04-17 NOTE — Progress Notes (Signed)
Family Meeting Note  Advance Directive:yes  Today a meeting took place with the Patient and son.  The following clinical team members were present during this meeting:MD  The following were discussed:Patient's diagnosis: COPD , CAD, Hypertension , Patient's progosis: Unable to determine and Goals for treatment: Full Code  Additional follow-up to be provided: PMD  Time spent during discussion:20 minutes  Raven [name], Raven Macadamia, MD

## 2016-04-17 NOTE — ED Triage Notes (Signed)
Sick for about 3 days.  Cough and went to pcp today.  Sent here for low pulse ox. In the 80s.    Is on home ox at2 liters. Is on 3l iters now and is 100%,

## 2016-04-18 LAB — BASIC METABOLIC PANEL
Anion gap: 11 (ref 5–15)
BUN: 17 mg/dL (ref 6–20)
CO2: 20 mmol/L — ABNORMAL LOW (ref 22–32)
Calcium: 7.8 mg/dL — ABNORMAL LOW (ref 8.9–10.3)
Chloride: 104 mmol/L (ref 101–111)
Creatinine, Ser: 0.83 mg/dL (ref 0.44–1.00)
GFR calc Af Amer: >60 mL/min (ref >60)
GFR calc non Af Amer: >60 mL/min (ref >60)
Glucose, Bld: 200 mg/dL — ABNORMAL HIGH (ref 65–99)
Potassium: 3.5 mmol/L (ref 3.5–5.1)
Sodium: 135 mmol/L (ref 135–145)

## 2016-04-18 LAB — CBC
HCT: 29.8 % — ABNORMAL LOW (ref 35.0–47.0)
Hemoglobin: 9.6 g/dL — ABNORMAL LOW (ref 12.0–16.0)
MCH: 25.9 pg — ABNORMAL LOW (ref 26.0–34.0)
MCHC: 32.3 g/dL (ref 32.0–36.0)
MCV: 80.1 fL (ref 80.0–100.0)
Platelets: 186 10*3/uL (ref 150–440)
RBC: 3.72 MIL/uL — ABNORMAL LOW (ref 3.80–5.20)
RDW: 16.8 % — ABNORMAL HIGH (ref 11.5–14.5)
WBC: 3.6 10*3/uL (ref 3.6–11.0)

## 2016-04-18 MED ORDER — GUAIFENESIN-DM 100-10 MG/5ML PO SYRP
5.0000 mL | ORAL_SOLUTION | ORAL | Status: DC | PRN
  Administered 2016-04-18 – 2016-04-20 (×3): 5 mL via ORAL
  Filled 2016-04-18 (×3): qty 5

## 2016-04-18 MED ORDER — IPRATROPIUM-ALBUTEROL 0.5-2.5 (3) MG/3ML IN SOLN: 3.0000 mL | RESPIRATORY_TRACT | Status: DC | PRN

## 2016-04-18 MED ORDER — AMITRIPTYLINE HCL 10 MG PO TABS
20.0000 mg | ORAL_TABLET | Freq: Every day | ORAL | Status: DC
  Administered 2016-04-18 – 2016-04-19 (×2): 20 mg via ORAL
  Filled 2016-04-18 (×2): qty 2

## 2016-04-18 MED ORDER — IPRATROPIUM-ALBUTEROL 0.5-2.5 (3) MG/3ML IN SOLN
3.0000 mL | Freq: Four times a day (QID) | RESPIRATORY_TRACT | Status: DC
  Administered 2016-04-18 – 2016-04-19 (×4): 3 mL via RESPIRATORY_TRACT
  Filled 2016-04-18 (×4): qty 3

## 2016-04-18 MED ORDER — ACETAMINOPHEN 325 MG PO TABS
650.0000 mg | ORAL_TABLET | Freq: Four times a day (QID) | ORAL | Status: DC | PRN
  Administered 2016-04-18: 650 mg via ORAL
  Filled 2016-04-18: qty 2

## 2016-04-18 MED ORDER — LEVOFLOXACIN 250 MG PO TABS: 250.0000 mg | ORAL_TABLET | Freq: Every day | ORAL | Status: DC

## 2016-04-18 MED ORDER — LEVOFLOXACIN 500 MG PO TABS
500.0000 mg | ORAL_TABLET | Freq: Every day | ORAL | Status: DC
  Administered 2016-04-18 – 2016-04-19 (×2): 500 mg via ORAL
  Filled 2016-04-18 (×2): qty 1

## 2016-04-18 NOTE — Progress Notes (Signed)
[facility] [place] NAME: Raven [name]    MR#:  [id number]  DATE OF BIRTH:  1934/12/14  SUBJECTIVE:  CHIEF COMPLAINT:   Chief Complaint  Patient presents with  . Shortness of Breath  still quite SOB, cough REVIEW OF SYSTEMS:  Review of Systems  Constitutional: Positive for malaise/fatigue. Negative for chills, fever and weight loss.  HENT: Negative for nosebleeds and sore throat.   Eyes: Negative for blurred vision.  Respiratory: Positive for cough, shortness of breath and wheezing.   Cardiovascular: Negative for chest pain, orthopnea, leg swelling and PND.  Gastrointestinal: Negative for abdominal pain, constipation, diarrhea, heartburn, nausea and vomiting.  Genitourinary: Negative for dysuria and urgency.  Musculoskeletal: Negative for back pain.  Skin: Negative for rash.  Neurological: Positive for weakness. Negative for dizziness, speech change, focal weakness and headaches.  Endo/Heme/Allergies: Does not bruise/bleed easily.  Psychiatric/Behavioral: Negative for depression.   DRUG ALLERGIES:   Allergies  Allergen Reactions  . Penicillins Other (See Comments)    Reaction:  Unknown  Has patient had a PCN reaction causing immediate rash, facial/tongue/throat swelling, SOB or lightheadedness with hypotension: unknown Has patient had a PCN reaction causing severe rash involving mucus membranes or skin necrosis: unknown Has patient had a PCN reaction that required hospitalization No Has patient had a PCN reaction occurring within the last 10 years: No If all of the above answers are "NO", then may proceed with Cephalosporin use.   . Augmentin [Amoxicillin-Pot Clavulanate] Other (See Comments)    Reaction:  Unknown   . Indocin [Indomethacin] Other (See Comments)    Reaction:  Unknown   . Lodine [Etodolac] Other (See Comments)    Reaction:   GI upset   . Methotrexate Derivatives Other (See Comments)    Reaction:  GI upset     . Gold Rash  . Zantac [Ranitidine Hcl] Rash   VITALS:  Blood pressure (!) 124/58, pulse 84, temperature 98.6 F (37 C), temperature source Oral, resp. rate 20, height 5' 7"  (1.702 m), weight 85.3 kg (188 lb 1.6 oz), SpO2 95 %. PHYSICAL EXAMINATION:  Physical Exam  Constitutional: She is oriented to person, place, and time and well-developed, well-nourished, and in no distress.  HENT:  Head: Normocephalic and atraumatic.  Eyes: Conjunctivae and EOM are normal. Pupils are equal, round, and reactive to light.  Neck: Normal range of motion. Neck supple. No tracheal deviation present. No thyromegaly present.  Cardiovascular: Normal rate, regular rhythm and normal heart sounds.   Pulmonary/Chest: Effort normal. No respiratory distress. She has decreased breath sounds. She has wheezes. She exhibits no tenderness.  Abdominal: Soft. Bowel sounds are normal. She exhibits no distension. There is no tenderness.  Musculoskeletal: Normal range of motion.  Neurological: She is alert and oriented to person, place, and time. No cranial nerve deficit.  Skin: Skin is warm and dry. No rash noted.  Psychiatric: Mood and affect normal.   LABORATORY PANEL:   CBC  Recent Labs Lab 04/18/16 0634  WBC 3.6  HGB 9.6*  HCT 29.8*  PLT 186   ------------------------------------------------------------------------------------------------------------------ Chemistries   Recent Labs Lab 04/17/16 1601 04/18/16 0634  NA 135 135  K 3.5 3.5  CL 100* 104  CO2 26 20*  GLUCOSE 88 200*  BUN 18 17  CREATININE 1.05* 0.83  CALCIUM 8.5* 7.8*  AST 33  --   ALT 28  --   ALKPHOS 69  --   BILITOT 0.5  --  RADIOLOGY:  Dg Chest 2 View  Result Date: 04/17/2016 CLINICAL DATA:  Cough, sick for 3 days EXAM: CHEST  2 VIEW COMPARISON:  Chest CT 06/19/2015, CXR 02/18/2016 FINDINGS: Chronic elevation of right hemidiaphragm. Diffuse interstitial prominence bilaterally which may reflect bronchitic change. No pneumonic  consolidation, effusion or pulmonary edema. Aortic atherosclerosis without aneurysm. Top normal size cardiac size. Old right-sided posterior eighth rib fracture. IMPRESSION: Mild diffuse interstitial prominence bilaterally consistent bronchitic change. Chronic elevation the right hemidiaphragm. Electronically Signed   By: Ashley Royalty M.D.   On: 04/17/2016 16:41   ASSESSMENT AND PLAN:  82 y.o. female with a known history of COPD on home oxygen, depression, hyperlipidemia, hypertension, myocardial infarction, pneumonia- was feeling short of breath and cough and went to her primary care doctor's office and was given cefuroxime 2 days ago. She did not improve any and continued to get worse so came to emergency room and is admitted.  * Sepsis: present on admission due to bronchitis * Acute on chronic hypoxic respiratory failure * Acute COPD exacerbation * Acute bronchitis -  Influenza test negative.   IV Solu-Medrol, DuoNeb, continue supplemental oxygen. - continue Levaquin for now. - Await sputum culture.  * Hypertension   Continue home meds except thiazide.  * Coronary artery disease   Continue aspirin, Plavix, metoprolol, statin.  * Hyperlipidemia   Continue statin.     All the records are reviewed and case discussed with Care Management/Social Worker. Management plans discussed with the patient, nursing and they are in agreement.  CODE STATUS: full code  TOTAL TIME TAKING CARE OF THIS PATIENT: 35 minutes.   More than 50% of the time was spent in counseling/coordination of care: YES  POSSIBLE D/C IN 1-2 DAYS, DEPENDING ON CLINICAL CONDITION.   Max Sane M.D on 04/18/2016 at 12:10 PM  Between 7am to 6pm - Pager - [phone]  After 6pm go to [email or web address] - Proofreader  [facility]  Office  [phone]  CC: Primary care physician; [name], MD  Note: This dictation was prepared with Dragon dictation along with smaller  phrase technology. Any transcriptional errors that result from this process are unintentional.

## 2016-04-18 NOTE — Progress Notes (Signed)
PHARMACIST - PHYSICIAN COMMUNICATION DR:   Vedia Pereyra CONCERNING: Antibiotic IV to Oral Route Change Policy  RECOMMENDATION: This patient is receiving Levofloxacin  by the intravenous route.  Based on criteria approved by the Pharmacy and Therapeutics Committee, the antibiotic(s) is/are being converted to the equivalent oral dose form(s).   DESCRIPTION: These criteria include:  Patient being treated for a respiratory tract infection, urinary tract infection, cellulitis or clostridium difficile associated diarrhea if on metronidazole  The patient is not neutropenic and does not exhibit a GI malabsorption state  The patient is eating (either orally or via tube) and/or has been taking other orally administered medications for a least 24 hours  The patient is improving clinically and has a Tmax < 100.5  If you have questions about this conversion, please contact the Pharmacy Department  []   [phone] )  [facility] [x]   [phone] )  [facility] []   [phone] )  [facility] []   [phone] )  [facility] []   [phone] )  [facility] Puna   Larene [place], PharmD

## 2016-04-19 MED ORDER — IPRATROPIUM-ALBUTEROL 0.5-2.5 (3) MG/3ML IN SOLN
3.0000 mL | Freq: Three times a day (TID) | RESPIRATORY_TRACT | Status: DC
  Administered 2016-04-19 – 2016-04-20 (×3): 3 mL via RESPIRATORY_TRACT
  Filled 2016-04-19 (×3): qty 3

## 2016-04-19 MED ORDER — METHYLPREDNISOLONE SODIUM SUCC 125 MG IJ SOLR
60.0000 mg | Freq: Two times a day (BID) | INTRAMUSCULAR | Status: DC
  Administered 2016-04-19 – 2016-04-20 (×2): 60 mg via INTRAVENOUS
  Filled 2016-04-19 (×2): qty 2

## 2016-04-19 NOTE — Progress Notes (Signed)
[place] [facility] [place] NAME: Raven [name]    MR#:  [id number]  DATE OF BIRTH:  October 09, 1934  SUBJECTIVE:  CHIEF COMPLAINT:   Chief Complaint  Patient presents with  . Shortness of Breath  feeling much better, looking forward for her b'day (wanting to go home tomorrow early), somewhat not happy about cancellation of her b'day party which was originally planned but canceled as she is admitted REVIEW OF SYSTEMS:  Review of Systems  Constitutional: Positive for malaise/fatigue. Negative for chills, fever and weight loss.  HENT: Negative for nosebleeds and sore throat.   Eyes: Negative for blurred vision.  Respiratory: Positive for cough.   Cardiovascular: Negative for chest pain, orthopnea, leg swelling and PND.  Gastrointestinal: Negative for abdominal pain, constipation, diarrhea, heartburn, nausea and vomiting.  Genitourinary: Negative for dysuria and urgency.  Musculoskeletal: Negative for back pain.  Skin: Negative for rash.  Neurological: Positive for weakness. Negative for dizziness, speech change, focal weakness and headaches.  Endo/Heme/Allergies: Does not bruise/bleed easily.  Psychiatric/Behavioral: Negative for depression.   DRUG ALLERGIES:   Allergies  Allergen Reactions  . Penicillins Other (See Comments)    Reaction:  Unknown  Has patient had a PCN reaction causing immediate rash, facial/tongue/throat swelling, SOB or lightheadedness with hypotension: unknown Has patient had a PCN reaction causing severe rash involving mucus membranes or skin necrosis: unknown Has patient had a PCN reaction that required hospitalization No Has patient had a PCN reaction occurring within the last 10 years: No If all of the above answers are "NO", then may proceed with Cephalosporin use.   . Augmentin [Amoxicillin-Pot Clavulanate] Other (See Comments)    Reaction:  Unknown   . Indocin [Indomethacin] Other (See Comments)    Reaction:   Unknown   . Lodine [Etodolac] Other (See Comments)    Reaction:   GI upset   . Methotrexate Derivatives Other (See Comments)    Reaction:  GI upset   . Gold Rash  . Zantac [Ranitidine Hcl] Rash   VITALS:  Blood pressure (!) 150/69, pulse 99, temperature 97.8 F (36.6 C), temperature source Oral, resp. rate 18, height 5' 7"  (1.702 m), weight 85.3 kg (188 lb 1.6 oz), SpO2 91 %. PHYSICAL EXAMINATION:  Physical Exam  Constitutional: She is oriented to person, place, and time and well-developed, well-nourished, and in no distress.  HENT:  Head: Normocephalic and atraumatic.  Eyes: Conjunctivae and EOM are normal. Pupils are equal, round, and reactive to light.  Neck: Normal range of motion. Neck supple. No tracheal deviation present. No thyromegaly present.  Cardiovascular: Normal rate, regular rhythm and normal heart sounds.   Pulmonary/Chest: Effort normal. No respiratory distress. She has decreased breath sounds. She has no wheezes. She exhibits no tenderness.  Abdominal: Soft. Bowel sounds are normal. She exhibits no distension. There is no tenderness.  Musculoskeletal: Normal range of motion.  Neurological: She is alert and oriented to person, place, and time. No cranial nerve deficit.  Skin: Skin is warm and dry. No rash noted.  Psychiatric: Mood and affect normal.   LABORATORY PANEL:   CBC  Recent Labs Lab 04/18/16 0634  WBC 3.6  HGB 9.6*  HCT 29.8*  PLT 186   ------------------------------------------------------------------------------------------------------------------ Chemistries   Recent Labs Lab 04/17/16 1601 04/18/16 0634  NA 135 135  K 3.5 3.5  CL 100* 104  CO2 26 20*  GLUCOSE 88 200*  BUN 18 17  CREATININE 1.05* 0.83  CALCIUM 8.5* 7.8*  AST 33  --   ALT 28  --   ALKPHOS 69  --   BILITOT 0.5  --    RADIOLOGY:  No results found. ASSESSMENT AND PLAN:  82 y.o. female with a known history of COPD on home oxygen, depression, hyperlipidemia,  hypertension, myocardial infarction, pneumonia- was feeling short of breath and cough and went to her primary care doctor's office and was given cefuroxime 2 days ago. She did not improve any and continued to get worse so came to emergency room and is admitted.  * Sepsis: present on admission due to bronchitis * Acute on chronic hypoxic respiratory failure * Acute COPD exacerbation * Acute bronchitis -  Influenza test negative.   IV Solu-Medrol (changed to BID today), DuoNeb, continue supplemental oxygen. Check for O2 qualification at test and on ambulation (she uses 2 liters at night chronically) - continue Levaquin for now. - sputum culture showed ABUNDANT WBC PRESENT,BOTH PMN AND MONONUCLEAR  RARE SQUAMOUS EPITHELIAL CELLS PRESENT  RARE GRAM POSITIVE COCCI IN PAIRS ABUNDANT WBC PRESENT,BOTH PMN AND MONONUCLEAR  RARE SQUAMOUS EPITHELIAL CELLS PRESENT  RARE GRAM POSITIVE COCCI IN PAIRS   * Hypertension   Continue home meds except thiazide.  * Coronary artery disease   Continue aspirin, Plavix, metoprolol, statin.  * Hyperlipidemia   Continue statin.     All the records are reviewed and case discussed with Care Management/Social Worker. Management plans discussed with the patient, nursing and they are in agreement.  CODE STATUS: full code  TOTAL TIME TAKING CARE OF THIS PATIENT: 35 minutes.   More than 50% of the time was spent in counseling/coordination of care: YES  POSSIBLE D/C IN am tomorrow, DEPENDING ON CLINICAL CONDITION.   Max Sane M.D on 04/19/2016 at 9:44 AM  Between 7am to 6pm - Pager - [phone]  After 6pm go to [email or web address] - Proofreader  [facility]  Office  [phone]  CC: Primary care physician; [name], MD  Note: This dictation was prepared with Dragon dictation along with smaller phrase technology. Any transcriptional errors that result from this process are unintentional.

## 2016-04-20 LAB — BASIC METABOLIC PANEL
Anion gap: 8 (ref 5–15)
BUN: 27 mg/dL — ABNORMAL HIGH (ref 6–20)
CO2: 25 mmol/L (ref 22–32)
Calcium: 8.6 mg/dL — ABNORMAL LOW (ref 8.9–10.3)
Chloride: 106 mmol/L (ref 101–111)
Creatinine, Ser: 0.95 mg/dL (ref 0.44–1.00)
GFR calc Af Amer: >60 mL/min (ref >60)
GFR calc non Af Amer: 55 mL/min — ABNORMAL LOW (ref >60)
Glucose, Bld: 158 mg/dL — ABNORMAL HIGH (ref 65–99)
Potassium: 3.7 mmol/L (ref 3.5–5.1)
Sodium: 139 mmol/L (ref 135–145)

## 2016-04-20 LAB — CBC
HCT: 30.8 % — ABNORMAL LOW (ref 35.0–47.0)
Hemoglobin: 10.1 g/dL — ABNORMAL LOW (ref 12.0–16.0)
MCH: 26.2 pg (ref 26.0–34.0)
MCHC: 32.8 g/dL (ref 32.0–36.0)
MCV: 79.9 fL — ABNORMAL LOW (ref 80.0–100.0)
Platelets: 237 10*3/uL (ref 150–440)
RBC: 3.86 MIL/uL (ref 3.80–5.20)
RDW: 16.8 % — ABNORMAL HIGH (ref 11.5–14.5)
WBC: 9.6 10*3/uL (ref 3.6–11.0)

## 2016-04-20 MED ORDER — LEVOFLOXACIN 500 MG PO TABS: 500.0000 mg | ORAL_TABLET | Freq: Every day | ORAL | 0 refills | Status: DC

## 2016-04-20 MED ORDER — GUAIFENESIN-DM 100-10 MG/5ML PO SYRP: 5.0000 mL | ORAL_SOLUTION | ORAL | 0 refills | Status: DC | PRN

## 2016-04-20 NOTE — Progress Notes (Signed)
SATURATION QUALIFICATIONS: (This note is used to comply with regulatory documentation for home oxygen)  Patient Saturations on Room Air at Rest = 88%  Patient Saturations on Room Air while Ambulating = 87%  Patient Saturations on 2 Liters of oxygen while Ambulating = 95%  Please briefly explain why patient needs home oxygen:

## 2016-04-20 NOTE — Care Management Important Message (Signed)
Important Message  Patient Details  Name: Raven [name] MRN: [id number] Date of Birth: 1934/06/02   Medicare Important Message Given:  Yes    Beverly Sessions, RN 04/20/2016, 10:29 AM

## 2016-04-20 NOTE — Care Management (Signed)
Clarified with Corene Cornea from [facility]. Patient has continuous O2.  Portable O2 tank delivered to tank prior to discharge.

## 2016-04-20 NOTE — Discharge Instructions (Signed)

## 2016-04-20 NOTE — Progress Notes (Signed)
Patient A&O.  Discharge instructions including medications reviewed with patient and son.  Understanding was verbalized and all questions were answered.  Awaiting delivery of portable O2 for transport home.

## 2016-04-21 LAB — CULTURE, RESPIRATORY W GRAM STAIN: Special Requests: NORMAL

## 2016-04-21 LAB — CULTURE, RESPIRATORY

## 2016-04-21 NOTE — Discharge Summary (Signed)
[place] at [place] NAME: Raven [name]    MR#:  [id number]  DATE OF BIRTH:  20-Jun-1934  DATE OF ADMISSION:  04/17/2016   ADMITTING PHYSICIAN: Vaughan Basta, MD  DATE OF DISCHARGE: 04/20/2016 10:47 AM  PRIMARY CARE PHYSICIAN: [name], MD   ADMISSION DIAGNOSIS:  Hypoxia [R09.02] COPD exacerbation (HCC) [J44.1] Dyspnea, unspecified type [R06.00] DISCHARGE DIAGNOSIS:  Principal Problem:   Acute bronchitis Active Problems:   Sepsis ([place])  SECONDARY DIAGNOSIS:   Past Medical History:  Diagnosis Date  . Anemia   . Arthritis   . Asthma   . Complication of anesthesia   . COPD (chronic obstructive pulmonary disease) ([name])   . Crohn's disease ([place])   . Depression    after death of husband  . Hyperlipidemia   . Hypertension   . Myocardial infarction   . Osteopenia   . Peptic ulcer disease   . Pneumonia   . PONV (postoperative nausea and vomiting)    HOSPITAL COURSE:  82 y.o.femalewith a known history of COPD on home oxygen, depression, hyperlipidemia, hypertension, myocardial infarction, pneumonia- was feeling short of breath and cough and went to her primary care doctor's office and was given cefuroxime 2 days ago. She did not improve any and continued to get worse so came to emergency room and was admitted.  * Sepsis: present on admission due to bronchitis.  Now resolved *Acute on chronic hypoxic respiratory failure * Acute COPD exacerbation *Acute bronchitis -Negative influenza -Improved significantly with IV steroids antibiotics and nebulizer breathing treatment and she is quite back to her baseline. DISCHARGE CONDITIONS:  Stable CONSULTS OBTAINED:   DRUG ALLERGIES:   Allergies  Allergen Reactions  . Penicillins Other (See Comments)    Reaction:  Unknown  Has patient had a PCN reaction causing immediate rash, facial/tongue/throat swelling, SOB or lightheadedness with hypotension:  unknown Has patient had a PCN reaction causing severe rash involving mucus membranes or skin necrosis: unknown Has patient had a PCN reaction that required hospitalization No Has patient had a PCN reaction occurring within the last 10 years: No If all of the above answers are "NO", then may proceed with Cephalosporin use.   . Augmentin [Amoxicillin-Pot Clavulanate] Other (See Comments)    Reaction:  Unknown   . Indocin [Indomethacin] Other (See Comments)    Reaction:  Unknown   . Lodine [Etodolac] Other (See Comments)    Reaction:   GI upset   . Methotrexate Derivatives Other (See Comments)    Reaction:  GI upset   . Gold Rash  . Zantac [Ranitidine Hcl] Rash   DISCHARGE MEDICATIONS:   Allergies as of 04/20/2016      Reactions   Penicillins Other (See Comments)   Reaction:  Unknown  Has patient had a PCN reaction causing immediate rash, facial/tongue/throat swelling, SOB or lightheadedness with hypotension: unknown Has patient had a PCN reaction causing severe rash involving mucus membranes or skin necrosis: unknown Has patient had a PCN reaction that required hospitalization No Has patient had a PCN reaction occurring within the last 10 years: No If all of the above answers are "NO", then may proceed with Cephalosporin use.   Augmentin [amoxicillin-pot Clavulanate] Other (See Comments)   Reaction:  Unknown    Indocin [indomethacin] Other (See Comments)   Reaction:  Unknown    Lodine [etodolac] Other (See Comments)   Reaction:   GI upset    Methotrexate Derivatives Other (See Comments)  Reaction:  GI upset    Gold Rash   Zantac [ranitidine Hcl] Rash      Medication List    TAKE these medications   acetaminophen 500 MG tablet Commonly known as:  TYLENOL Take 1,000 mg by mouth See admin instructions. Takes 2 tablets at night and during the day only if needed for pain   albuterol 108 (90 Base) MCG/ACT inhaler Commonly known as:  PROVENTIL HFA;VENTOLIN HFA Inhale 2 puffs  into the lungs every 6 (six) hours as needed for wheezing or shortness of breath.   ALLERGY PO Take 1 tablet by mouth daily.   amitriptyline 10 MG tablet Commonly known as:  ELAVIL Take 20 mg by mouth at bedtime.   amLODipine-benazepril 5-20 MG capsule Commonly known as:  LOTREL Take 1 capsule by mouth daily.   aspirin EC 81 MG tablet Take 81 mg by mouth daily.   atorvastatin 80 MG tablet Commonly known as:  LIPITOR Take 80 mg by mouth at bedtime.   B-12 PO Take 1 tablet by mouth daily. Triple B-12   benzonatate 100 MG capsule Commonly known as:  TESSALON Take by mouth 3 (three) times daily as needed for cough.   cefUROXime 250 MG tablet Commonly known as:  CEFTIN Take 250 mg by mouth 2 (two) times daily with a meal. For 10 days   CITRACAL +D3 PO Take 1 tablet by mouth daily.   clopidogrel 75 MG tablet Commonly known as:  PLAVIX Take 75 mg by mouth daily.   Fluticasone-Salmeterol 250-50 MCG/DOSE Aepb Commonly known as:  ADVAIR Inhale 1 puff into the lungs 2 (two) times daily.   guaiFENesin-dextromethorphan 100-10 MG/5ML syrup Commonly known as:  ROBITUSSIN DM Take 5 mLs by mouth every 4 (four) hours as needed for cough.   HUMIRA 40 MG/0.8ML Pskt Generic drug:  Adalimumab Inject 40 mg into the skin every 14 (fourteen) days.   hydrochlorothiazide 25 MG tablet Commonly known as:  HYDRODIURIL Take 25 mg by mouth daily.   levofloxacin 500 MG tablet Commonly known as:  LEVAQUIN Take 1 tablet (500 mg total) by mouth daily.   mesalamine 500 MG CR capsule Commonly known as:  PENTASA Take 1,000 mg by mouth 2 (two) times daily.   metoprolol succinate 25 MG 24 hr tablet Commonly known as:  TOPROL-XL Take 25 mg by mouth daily.   multivitamin with minerals Tabs tablet Take 1 tablet by mouth daily.   nitroGLYCERIN 0.4 MG SL tablet Commonly known as:  NITROSTAT Place 0.4 mg under the tongue every 5 (five) minutes as needed for chest pain.   potassium chloride  10 MEQ tablet Commonly known as:  K-DUR,KLOR-CON Take 10 mEq by mouth daily.   predniSONE 1 MG tablet Commonly known as:  DELTASONE Take 2 mg by mouth daily.   risedronate 150 MG tablet Commonly known as:  ACTONEL Take 150 mg by mouth every 30 (thirty) days. with water on empty stomach, nothing by mouth or lie down for next 30 minutes.   senna 8.6 MG tablet Commonly known as:  SENOKOT Take 2 tablets by mouth at bedtime.   thiamine 100 MG tablet Commonly known as:  VITAMIN B-1 Take 100 mg by mouth daily.   vitamin E 400 UNIT capsule Take 400 Units by mouth daily.      DISCHARGE INSTRUCTIONS:   DIET:  Regular diet DISCHARGE CONDITION:  Good ACTIVITY:  Activity as tolerated OXYGEN:  Home Oxygen: No.  Oxygen Delivery: room air DISCHARGE LOCATION:  home  If you experience worsening of your admission symptoms, develop shortness of breath, life threatening emergency, suicidal or homicidal thoughts you must seek medical attention immediately by calling 911 or calling your MD immediately  if symptoms less severe.  You Must read complete instructions/literature along with all the possible adverse reactions/side effects for all the Medicines you take and that have been prescribed to you. Take any new Medicines after you have completely understood and accpet all the possible adverse reactions/side effects.   Please note  You were cared for by a hospitalist during your hospital stay. If you have any questions about your discharge medications or the care you received while you were in the hospital after you are discharged, you can call the unit and asked to speak with the hospitalist on call if the hospitalist that took care of you is not available. Once you are discharged, your primary care physician will handle any further medical issues. Please note that NO REFILLS for any discharge medications will be authorized once you are discharged, as it is imperative that you return to your  primary care physician (or establish a relationship with a primary care physician if you do not have one) for your aftercare needs so that they can reassess your need for medications and monitor your lab values.    On the day of Discharge:  VITAL SIGNS:  Blood pressure (!) 144/60, pulse 74, temperature 98 F (36.7 C), temperature source Oral, resp. rate 20, height 5' 7"  (1.702 m), weight 85.3 kg (188 lb 1.6 oz), SpO2 100 %. PHYSICAL EXAMINATION:  GENERAL:  82 y.o.-year-old patient lying in the bed with no acute distress.  EYES: Pupils equal, round, reactive to light and accommodation. No scleral icterus. Extraocular muscles intact.  HEENT: Head atraumatic, normocephalic. Oropharynx and nasopharynx clear.  NECK:  Supple, no jugular venous distention. No thyroid enlargement, no tenderness.  LUNGS: Normal breath sounds bilaterally, no wheezing, rales,rhonchi or crepitation. No use of accessory muscles of respiration.  CARDIOVASCULAR: S1, S2 normal. No murmurs, rubs, or gallops.  ABDOMEN: Soft, non-tender, non-distended. Bowel sounds present. No organomegaly or mass.  EXTREMITIES: No pedal edema, cyanosis, or clubbing.  NEUROLOGIC: Cranial nerves II through XII are intact. Muscle strength 5/5 in all extremities. Sensation intact. Gait not checked.  PSYCHIATRIC: The patient is alert and oriented x 3.  SKIN: No obvious rash, lesion, or ulcer.  DATA REVIEW:   CBC  Recent Labs Lab 04/20/16 0451  WBC 9.6  HGB 10.1*  HCT 30.8*  PLT 237    Chemistries   Recent Labs Lab 04/17/16 1601  04/20/16 0451  NA 135  < > 139  K 3.5  < > 3.7  CL 100*  < > 106  CO2 26  < > 25  GLUCOSE 88  < > 158*  BUN 18  < > 27*  CREATININE 1.05*  < > 0.95  CALCIUM 8.5*  < > 8.6*  AST 33  --   --   ALT 28  --   --   ALKPHOS 69  --   --   BILITOT 0.5  --   --   < > = values in this interval not displayed.   Follow-up Information    [name], MD. Go on 04/27/2016.   Specialty:  Internal  Medicine Why:  Monday at 11:00am for hospital follow-up Contact information: [place] [facility] [place] [address] [phone]  Management plans discussed with the patient, family and they are in agreement.  CODE STATUS: Full  TOTAL TIME TAKING CARE OF THIS PATIENT: 45 minutes.    Max Sane M.D on 04/21/2016 at 4:31 PM  Between 7am to 6pm - Pager - [phone]  After 6pm go to [email or web address] - Proofreader  [facility]  Office  [phone]  CC: Primary care physician; [name], MD   Note: This dictation was prepared with Dragon dictation along with smaller phrase technology. Any transcriptional errors that result from this process are unintentional.

## 2016-04-22 ENCOUNTER — Emergency Department: Payer: Medicare Other

## 2016-04-22 ENCOUNTER — Other Ambulatory Visit: Payer: Self-pay

## 2016-04-22 ENCOUNTER — Encounter: Payer: Self-pay | Admitting: Emergency Medicine

## 2016-04-22 ENCOUNTER — Ambulatory Visit: Admission: RE | Admit: 2016-04-22 | Payer: Medicare Other | Source: Ambulatory Visit | Admitting: Orthopedic Surgery

## 2016-04-22 ENCOUNTER — Inpatient Hospital Stay
Admission: EM | Admit: 2016-04-22 | Discharge: 2016-04-24 | DRG: 190 | Disposition: A | Discharge status: Home Health | Payer: Medicare Other | Attending: Internal Medicine | Admitting: Internal Medicine

## 2016-04-22 ENCOUNTER — Encounter: Admission: RE | Payer: Self-pay | Source: Ambulatory Visit

## 2016-04-22 DIAGNOSIS — I10 Essential (primary) hypertension: Secondary | ICD-10-CM | POA: Diagnosis present

## 2016-04-22 DIAGNOSIS — I252 Old myocardial infarction: Secondary | ICD-10-CM

## 2016-04-22 DIAGNOSIS — M6281 Muscle weakness (generalized): Secondary | ICD-10-CM

## 2016-04-22 DIAGNOSIS — Z87891 Personal history of nicotine dependence: Secondary | ICD-10-CM | POA: Diagnosis not present

## 2016-04-22 DIAGNOSIS — Z66 Do not resuscitate: Secondary | ICD-10-CM | POA: Diagnosis present

## 2016-04-22 DIAGNOSIS — K219 Gastro-esophageal reflux disease without esophagitis: Secondary | ICD-10-CM | POA: Diagnosis present

## 2016-04-22 DIAGNOSIS — J189 Pneumonia, unspecified organism: Secondary | ICD-10-CM | POA: Diagnosis present

## 2016-04-22 DIAGNOSIS — K5090 Crohn's disease, unspecified, without complications: Secondary | ICD-10-CM | POA: Diagnosis present

## 2016-04-22 DIAGNOSIS — J441 Chronic obstructive pulmonary disease with (acute) exacerbation: Secondary | ICD-10-CM | POA: Diagnosis present

## 2016-04-22 DIAGNOSIS — Z79899 Other long term (current) drug therapy: Secondary | ICD-10-CM | POA: Diagnosis not present

## 2016-04-22 DIAGNOSIS — J440 Chronic obstructive pulmonary disease with acute lower respiratory infection: Secondary | ICD-10-CM | POA: Diagnosis present

## 2016-04-22 DIAGNOSIS — R000 Tachycardia, unspecified: Secondary | ICD-10-CM | POA: Diagnosis present

## 2016-04-22 DIAGNOSIS — Z7982 Long term (current) use of aspirin: Secondary | ICD-10-CM | POA: Diagnosis not present

## 2016-04-22 DIAGNOSIS — E876 Hypokalemia: Secondary | ICD-10-CM | POA: Diagnosis present

## 2016-04-22 DIAGNOSIS — Z8249 Family history of ischemic heart disease and other diseases of the circulatory system: Secondary | ICD-10-CM | POA: Diagnosis not present

## 2016-04-22 DIAGNOSIS — R262 Difficulty in walking, not elsewhere classified: Secondary | ICD-10-CM

## 2016-04-22 DIAGNOSIS — E785 Hyperlipidemia, unspecified: Secondary | ICD-10-CM | POA: Diagnosis present

## 2016-04-22 DIAGNOSIS — J209 Acute bronchitis, unspecified: Secondary | ICD-10-CM | POA: Diagnosis present

## 2016-04-22 DIAGNOSIS — J449 Chronic obstructive pulmonary disease, unspecified: Secondary | ICD-10-CM | POA: Diagnosis present

## 2016-04-22 DIAGNOSIS — R0602 Shortness of breath: Secondary | ICD-10-CM | POA: Diagnosis present

## 2016-04-22 LAB — BASIC METABOLIC PANEL
Anion gap: 8 (ref 5–15)
BUN: 18 mg/dL (ref 6–20)
CO2: 27 mmol/L (ref 22–32)
Calcium: 8.5 mg/dL — ABNORMAL LOW (ref 8.9–10.3)
Chloride: 101 mmol/L (ref 101–111)
Creatinine, Ser: 1.02 mg/dL — ABNORMAL HIGH (ref 0.44–1.00)
GFR calc Af Amer: 59 mL/min — ABNORMAL LOW (ref >60)
GFR calc non Af Amer: 51 mL/min — ABNORMAL LOW (ref >60)
Glucose, Bld: 94 mg/dL (ref 65–99)
Potassium: 3.3 mmol/L — ABNORMAL LOW (ref 3.5–5.1)
Sodium: 136 mmol/L (ref 135–145)

## 2016-04-22 LAB — CULTURE, BLOOD (ROUTINE X 2)
Culture: NO GROWTH
Culture: NO GROWTH

## 2016-04-22 LAB — CBC
HCT: 32.6 % — ABNORMAL LOW (ref 35.0–47.0)
Hemoglobin: 10.6 g/dL — ABNORMAL LOW (ref 12.0–16.0)
MCH: 26 pg (ref 26.0–34.0)
MCHC: 32.6 g/dL (ref 32.0–36.0)
MCV: 79.7 fL — ABNORMAL LOW (ref 80.0–100.0)
Platelets: 275 10*3/uL (ref 150–440)
RBC: 4.09 MIL/uL (ref 3.80–5.20)
RDW: 16.7 % — ABNORMAL HIGH (ref 11.5–14.5)
WBC: 11.8 10*3/uL — ABNORMAL HIGH (ref 3.6–11.0)

## 2016-04-22 LAB — MAGNESIUM: Magnesium: 1.7 mg/dL (ref 1.7–2.4)

## 2016-04-22 LAB — INFLUENZA PANEL BY PCR (TYPE A & B)
Influenza A By PCR: NEGATIVE
Influenza B By PCR: NEGATIVE

## 2016-04-22 LAB — TROPONIN I: Troponin I: 0.03 ng/mL (ref <0.03)

## 2016-04-22 SURGERY — ARTHROSCOPY, KNEE
Anesthesia: Choice | Laterality: Left

## 2016-04-22 MED ORDER — IPRATROPIUM-ALBUTEROL 0.5-2.5 (3) MG/3ML IN SOLN
3.0000 mL | Freq: Once | RESPIRATORY_TRACT | Status: AC
  Administered 2016-04-22: 3 mL via RESPIRATORY_TRACT
  Filled 2016-04-22: qty 3

## 2016-04-22 MED ORDER — ALBUTEROL SULFATE (2.5 MG/3ML) 0.083% IN NEBU: 5.0000 mg | INHALATION_SOLUTION | Freq: Once | RESPIRATORY_TRACT | Status: DC

## 2016-04-22 MED ORDER — SODIUM CHLORIDE 0.9 % IV SOLN
INTRAVENOUS | Status: DC
  Administered 2016-04-22 – 2016-04-24 (×4): via INTRAVENOUS

## 2016-04-22 MED ORDER — ATORVASTATIN CALCIUM 20 MG PO TABS
80.0000 mg | ORAL_TABLET | Freq: Every day | ORAL | Status: DC
  Administered 2016-04-22 – 2016-04-23 (×2): 80 mg via ORAL
  Filled 2016-04-22 (×3): qty 4

## 2016-04-22 MED ORDER — METOPROLOL SUCCINATE ER 25 MG PO TB24
25.0000 mg | ORAL_TABLET | Freq: Every day | ORAL | Status: DC
  Administered 2016-04-22 – 2016-04-24 (×3): 25 mg via ORAL
  Filled 2016-04-22 (×3): qty 1

## 2016-04-22 MED ORDER — VITAMIN E 180 MG (400 UNIT) PO CAPS
400.0000 [IU] | ORAL_CAPSULE | Freq: Every day | ORAL | Status: DC
  Administered 2016-04-22: 400 [IU] via ORAL
  Filled 2016-04-22 (×2): qty 1

## 2016-04-22 MED ORDER — METHYLPREDNISOLONE SODIUM SUCC 125 MG IJ SOLR
125.0000 mg | Freq: Once | INTRAMUSCULAR | Status: AC
  Administered 2016-04-22: 125 mg via INTRAVENOUS
  Filled 2016-04-22: qty 2

## 2016-04-22 MED ORDER — ACETAMINOPHEN 325 MG PO TABS: 650.0000 mg | ORAL_TABLET | Freq: Four times a day (QID) | ORAL | Status: DC | PRN

## 2016-04-22 MED ORDER — ENOXAPARIN SODIUM 40 MG/0.4ML ~~LOC~~ SOLN
40.0000 mg | SUBCUTANEOUS | Status: DC
  Administered 2016-04-22 – 2016-04-24 (×3): 40 mg via SUBCUTANEOUS
  Filled 2016-04-22 (×3): qty 0.4

## 2016-04-22 MED ORDER — ONDANSETRON HCL 4 MG PO TABS
4.0000 mg | ORAL_TABLET | Freq: Four times a day (QID) | ORAL | Status: DC | PRN
  Administered 2016-04-22: 4 mg via ORAL
  Filled 2016-04-22: qty 1

## 2016-04-22 MED ORDER — MESALAMINE ER 250 MG PO CPCR
1000.0000 mg | ORAL_CAPSULE | Freq: Two times a day (BID) | ORAL | Status: DC
  Administered 2016-04-22 – 2016-04-24 (×5): 1000 mg via ORAL
  Filled 2016-04-22 (×6): qty 4

## 2016-04-22 MED ORDER — VITAMIN B-1 100 MG PO TABS
100.0000 mg | ORAL_TABLET | Freq: Every day | ORAL | Status: DC
  Administered 2016-04-22: 12:00:00 100 mg via ORAL
  Filled 2016-04-22 (×2): qty 1

## 2016-04-22 MED ORDER — ASPIRIN EC 81 MG PO TBEC
81.0000 mg | DELAYED_RELEASE_TABLET | Freq: Every day | ORAL | Status: DC
  Administered 2016-04-22 – 2016-04-24 (×3): 81 mg via ORAL
  Filled 2016-04-22 (×4): qty 1

## 2016-04-22 MED ORDER — CALCIUM-PHOSPHORUS-VITAMIN D 250-107-500 MG-MG-UNIT PO CHEW
CHEWABLE_TABLET | Freq: Every day | ORAL | Status: DC
  Filled 2016-04-22: qty 3

## 2016-04-22 MED ORDER — METHYLPREDNISOLONE SODIUM SUCC 40 MG IJ SOLR
40.0000 mg | Freq: Three times a day (TID) | INTRAMUSCULAR | Status: DC
  Administered 2016-04-22 – 2016-04-23 (×3): 40 mg via INTRAVENOUS
  Filled 2016-04-22 (×3): qty 1

## 2016-04-22 MED ORDER — NITROGLYCERIN 0.4 MG SL SUBL: 0.4000 mg | SUBLINGUAL_TABLET | SUBLINGUAL | Status: DC | PRN

## 2016-04-22 MED ORDER — RISEDRONATE SODIUM 150 MG PO TABS
150.0000 mg | ORAL_TABLET | ORAL | Status: DC
Start: 2016-04-22 — End: 2016-04-22
  Filled 2016-04-22: qty 1

## 2016-04-22 MED ORDER — ACETAMINOPHEN 650 MG RE SUPP: 650.0000 mg | Freq: Four times a day (QID) | RECTAL | Status: DC | PRN

## 2016-04-22 MED ORDER — MOMETASONE FURO-FORMOTEROL FUM 200-5 MCG/ACT IN AERO
2.0000 | INHALATION_SPRAY | Freq: Two times a day (BID) | RESPIRATORY_TRACT | Status: DC
  Administered 2016-04-22 – 2016-04-24 (×5): 2 via RESPIRATORY_TRACT
  Filled 2016-04-22: qty 8.8

## 2016-04-22 MED ORDER — SENNOSIDES-DOCUSATE SODIUM 8.6-50 MG PO TABS: 1.0000 | ORAL_TABLET | Freq: Every evening | ORAL | Status: DC | PRN

## 2016-04-22 MED ORDER — ADULT MULTIVITAMIN W/MINERALS CH
1.0000 | ORAL_TABLET | Freq: Every day | ORAL | Status: DC
  Administered 2016-04-22: 1 via ORAL
  Filled 2016-04-22 (×2): qty 1

## 2016-04-22 MED ORDER — AMLODIPINE BESYLATE 5 MG PO TABS
5.0000 mg | ORAL_TABLET | Freq: Every day | ORAL | Status: DC
  Administered 2016-04-22 – 2016-04-24 (×2): 5 mg via ORAL
  Filled 2016-04-22 (×3): qty 1

## 2016-04-22 MED ORDER — IPRATROPIUM-ALBUTEROL 0.5-2.5 (3) MG/3ML IN SOLN
3.0000 mL | RESPIRATORY_TRACT | Status: DC
  Administered 2016-04-22 – 2016-04-23 (×8): 3 mL via RESPIRATORY_TRACT
  Filled 2016-04-22 (×5): qty 3

## 2016-04-22 MED ORDER — LEVOFLOXACIN IN D5W 750 MG/150ML IV SOLN
750.0000 mg | INTRAVENOUS | Status: DC
  Administered 2016-04-22: 12:00:00 750 mg via INTRAVENOUS
  Filled 2016-04-22: qty 150

## 2016-04-22 MED ORDER — BENZONATATE 100 MG PO CAPS
100.0000 mg | ORAL_CAPSULE | Freq: Three times a day (TID) | ORAL | Status: DC | PRN
  Administered 2016-04-22 – 2016-04-24 (×3): 100 mg via ORAL
  Filled 2016-04-22 (×3): qty 1

## 2016-04-22 MED ORDER — SENNA 8.6 MG PO TABS
2.0000 | ORAL_TABLET | Freq: Every day | ORAL | Status: DC
  Filled 2016-04-22: qty 2

## 2016-04-22 MED ORDER — BENAZEPRIL HCL 20 MG PO TABS
20.0000 mg | ORAL_TABLET | Freq: Every day | ORAL | Status: DC
  Administered 2016-04-22 – 2016-04-24 (×2): 20 mg via ORAL
  Filled 2016-04-22 (×4): qty 1

## 2016-04-22 MED ORDER — ALBUTEROL SULFATE (2.5 MG/3ML) 0.083% IN NEBU: 3.0000 mL | INHALATION_SOLUTION | Freq: Four times a day (QID) | RESPIRATORY_TRACT | Status: DC | PRN

## 2016-04-22 MED ORDER — POTASSIUM CHLORIDE CRYS ER 20 MEQ PO TBCR
20.0000 meq | EXTENDED_RELEASE_TABLET | Freq: Once | ORAL | Status: AC
  Administered 2016-04-22: 17:00:00 20 meq via ORAL
  Filled 2016-04-22: qty 1

## 2016-04-22 MED ORDER — TRAMADOL HCL 50 MG PO TABS
50.0000 mg | ORAL_TABLET | Freq: Four times a day (QID) | ORAL | Status: DC | PRN
  Administered 2016-04-22 – 2016-04-23 (×2): 50 mg via ORAL
  Filled 2016-04-22 (×2): qty 1

## 2016-04-22 MED ORDER — CALCIUM CITRATE-VITAMIN D 500-400 MG-UNIT PO CHEW
1.0000 | CHEWABLE_TABLET | Freq: Every day | ORAL | Status: DC
  Administered 2016-04-22: 1 via ORAL
  Filled 2016-04-22 (×3): qty 1

## 2016-04-22 MED ORDER — CLOPIDOGREL BISULFATE 75 MG PO TABS
75.0000 mg | ORAL_TABLET | Freq: Every day | ORAL | Status: DC
Start: 2016-04-22 — End: 2016-04-24
  Administered 2016-04-22 – 2016-04-24 (×3): 75 mg via ORAL
  Filled 2016-04-22 (×3): qty 1

## 2016-04-22 MED ORDER — POTASSIUM CHLORIDE CRYS ER 10 MEQ PO TBCR
10.0000 meq | EXTENDED_RELEASE_TABLET | Freq: Every day | ORAL | Status: DC
  Administered 2016-04-22: 12:00:00 10 meq via ORAL
  Filled 2016-04-22 (×2): qty 1

## 2016-04-22 MED ORDER — GUAIFENESIN-DM 100-10 MG/5ML PO SYRP: 5.0000 mL | ORAL_SOLUTION | ORAL | Status: DC | PRN

## 2016-04-22 MED ORDER — AMLODIPINE BESY-BENAZEPRIL HCL 5-20 MG PO CAPS: 1.0000 | ORAL_CAPSULE | Freq: Every day | ORAL | Status: DC

## 2016-04-22 MED ORDER — AZITHROMYCIN 500 MG IV SOLR
500.0000 mg | Freq: Once | INTRAVENOUS | Status: AC
  Administered 2016-04-22: 500 mg via INTRAVENOUS
  Filled 2016-04-22: qty 500

## 2016-04-22 MED ORDER — AMITRIPTYLINE HCL 10 MG PO TABS
20.0000 mg | ORAL_TABLET | Freq: Every day | ORAL | Status: DC
  Administered 2016-04-22 – 2016-04-23 (×2): 20 mg via ORAL
  Filled 2016-04-22 (×3): qty 2

## 2016-04-22 MED ORDER — ONDANSETRON HCL 4 MG/2ML IJ SOLN: 4.0000 mg | Freq: Four times a day (QID) | INTRAMUSCULAR | Status: DC | PRN

## 2016-04-22 MED ORDER — HYDROCHLOROTHIAZIDE 25 MG PO TABS
25.0000 mg | ORAL_TABLET | Freq: Every day | ORAL | Status: DC
  Administered 2016-04-22 – 2016-04-24 (×2): 25 mg via ORAL
  Filled 2016-04-22 (×3): qty 1

## 2016-04-22 NOTE — Progress Notes (Signed)
Levaquin dose adjustment per CrCl 20 - 49 ml/min: every 48 hours. Original dose: levaquin 750 mg q24h Will adjust to: levaquin 750 mg q48h to start 2/14 per CrCl 46.9 ml/min.  Tobie Lords, PharmD, BCPS Clinical Pharmacist 04/22/2016

## 2016-04-22 NOTE — Progress Notes (Signed)
After speaking with her some she wants to be FULL CODE Changed in chart

## 2016-04-22 NOTE — ED Triage Notes (Signed)
Pt arrived to ED by EMS with c/o difficulty breathing. EMS reports pt was D/C (by AMA) on 04/20/16 for bronchitis. Pt has productive cough and bilateral wheezing. HX of COPD, chronic O2 of 2L.

## 2016-04-22 NOTE — Progress Notes (Signed)
Family Meeting Note  Advance Directive:yes  Today a meeting took place with the Patient.     The following clinical team members were present during this meeting:MD  The following were discussed:Patient's diagnosis: copd exacerbation acute bronchitis   , Patient's progosis: Unable to determine and Goals for treatment:DNR Additional follow-up to be provided: none  Time spent during discussion:16 minutes  Raven Demeter, MD

## 2016-04-22 NOTE — ED Provider Notes (Signed)
[facility] Emergency Department Provider Note    First MD Initiated Contact with Patient 04/22/16 [phone]     (approximate)  I have reviewed the triage vital signs and the nursing notes.   HISTORY  Chief Complaint Shortness of Breath    HPI Raven [name] is a 82 y.o. female with bolus of chronic medical conditions including COPD presents with progressive dyspnea 2 days with considerable worsening tonight. Patient admits to cough however denies any fever. Of note patient was recently discharged from the hospital on 04/20/2016 following admission for bronchitis   Past Medical History:  Diagnosis Date  . Anemia   . Arthritis   . Asthma   . Complication of anesthesia   . COPD (chronic obstructive pulmonary disease) ([place])   . Crohn's disease ([place])   . Depression    after death of husband  . Hyperlipidemia   . Hypertension   . Myocardial infarction   . Osteopenia   . Peptic ulcer disease   . Pneumonia   . PONV (postoperative nausea and vomiting)     Patient Active Problem List   Diagnosis Date Noted  . Acute bronchitis 04/17/2016  . Sepsis ([place]) 04/17/2016  . Peroneal tendonitis 07/18/2015  . Pneumonia 04/19/2015  . CAP (community acquired pneumonia) 11/02/2014    Past Surgical History:  Procedure Laterality Date  . ABDOMINAL SURGERY    . CARDIAC CATHETERIZATION     with stent  . COLONOSCOPY WITH PROPOFOL N/A 09/27/2014   Procedure: COLONOSCOPY WITH PROPOFOL;  Surgeon: [name], MD;  Location: [facility] ENDOSCOPY;  Service: Gastroenterology;  Laterality: N/A;  . EYE SURGERY     bilateral cataract surgeries  . SHOULDER SURGERY Right    x 2   . SMALL INTESTINE SURGERY      Prior to Admission medications   Medication Sig Start Date End Date Taking? Authorizing Provider  acetaminophen (TYLENOL) 500 MG tablet Take 1,000 mg by mouth See admin instructions. Takes 2 tablets at night and during the day only if needed for pain    Historical  Provider, MD  Adalimumab (HUMIRA) 40 MG/0.8ML PSKT Inject 40 mg into the skin every 14 (fourteen) days.     Historical Provider, MD  albuterol (PROVENTIL HFA;VENTOLIN HFA) 108 (90 BASE) MCG/ACT inhaler Inhale 2 puffs into the lungs every 6 (six) hours as needed for wheezing or shortness of breath.    Historical Provider, MD  amitriptyline (ELAVIL) 10 MG tablet Take 20 mg by mouth at bedtime.    Historical Provider, MD  amLODipine-benazepril (LOTREL) 5-20 MG per capsule Take 1 capsule by mouth daily.     Historical Provider, MD  aspirin EC 81 MG tablet Take 81 mg by mouth daily.    Historical Provider, MD  atorvastatin (LIPITOR) 80 MG tablet Take 80 mg by mouth at bedtime.     Historical Provider, MD  benzonatate (TESSALON) 100 MG capsule Take by mouth 3 (three) times daily as needed for cough.    Historical Provider, MD  Calcium-Phosphorus-Vitamin D (CITRACAL +D3 PO) Take 1 tablet by mouth daily.    Historical Provider, MD  cefUROXime (CEFTIN) 250 MG tablet Take 250 mg by mouth 2 (two) times daily with a meal. For 10 days    Historical Provider, MD  Chlorpheniramine Maleate (ALLERGY PO) Take 1 tablet by mouth daily.    Historical Provider, MD  clopidogrel (PLAVIX) 75 MG tablet Take 75 mg by mouth daily.    Historical Provider, MD  Cyanocobalamin (B-12  PO) Take 1 tablet by mouth daily. Triple B-12    Historical Provider, MD  Fluticasone-Salmeterol (ADVAIR) 250-50 MCG/DOSE AEPB Inhale 1 puff into the lungs 2 (two) times daily.    Historical Provider, MD  guaiFENesin-dextromethorphan (ROBITUSSIN DM) 100-10 MG/5ML syrup Take 5 mLs by mouth every 4 (four) hours as needed for cough. 04/20/16   Max Sane, MD  hydrochlorothiazide (HYDRODIURIL) 25 MG tablet Take 25 mg by mouth daily.     Historical Provider, MD  levofloxacin (LEVAQUIN) 500 MG tablet Take 1 tablet (500 mg total) by mouth daily. 04/20/16   Max Sane, MD  mesalamine (PENTASA) 500 MG CR capsule Take 1,000 mg by mouth 2 (two) times daily.     Historical Provider, MD  metoprolol succinate (TOPROL-XL) 25 MG 24 hr tablet Take 25 mg by mouth daily.    Historical Provider, MD  Multiple Vitamin (MULTIVITAMIN WITH MINERALS) TABS tablet Take 1 tablet by mouth daily.    Historical Provider, MD  nitroGLYCERIN (NITROSTAT) 0.4 MG SL tablet Place 0.4 mg under the tongue every 5 (five) minutes as needed for chest pain.    Historical Provider, MD  potassium chloride (K-DUR,KLOR-CON) 10 MEQ tablet Take 10 mEq by mouth daily.     Historical Provider, MD  predniSONE (DELTASONE) 1 MG tablet Take 2 mg by mouth daily.     Historical Provider, MD  risedronate (ACTONEL) 150 MG tablet Take 150 mg by mouth every 30 (thirty) days. with water on empty stomach, nothing by mouth or lie down for next 30 minutes.    Historical Provider, MD  senna (SENOKOT) 8.6 MG tablet Take 2 tablets by mouth at bedtime.     Historical Provider, MD  thiamine (VITAMIN B-1) 100 MG tablet Take 100 mg by mouth daily.    Historical Provider, MD  vitamin E 400 UNIT capsule Take 400 Units by mouth daily.    Historical Provider, MD    Allergies Penicillins; Augmentin [amoxicillin-pot clavulanate]; Indocin [indomethacin]; Lodine [etodolac]; Methotrexate derivatives; Gold; and Zantac [ranitidine hcl]  Family History  Problem Relation Age of Onset  . Hypertension      Social History Social History  Substance Use Topics  . Smoking status: Former Smoker    Quit date: 1974  . Smokeless tobacco: Never Used  . Alcohol use Yes     Comment: occassional    Review of Systems Constitutional: No fever/chills Eyes: No visual changes. ENT: No sore throat. Cardiovascular: Denies chest pain. Respiratory:Positive for cough and dyspnea Gastrointestinal: No abdominal pain.  No nausea, no vomiting.  No diarrhea.  No constipation. Genitourinary: Negative for dysuria. Musculoskeletal: Negative for back pain. Skin: Negative for rash. Neurological: Negative for headaches, focal weakness or  numbness.  10-point ROS otherwise negative.  ____________________________________________   PHYSICAL EXAM:  VITAL SIGNS: ED Triage Vitals  Enc Vitals Group     BP 04/22/16 0604 (!) 131/105     Pulse Rate 04/22/16 0604 (!) 118     Resp 04/22/16 0604 18     Temp 04/22/16 0604 98.3 F (36.8 C)     Temp Source 04/22/16 0604 Oral     SpO2 04/22/16 0528 96 %     Weight 04/22/16 0528 188 lb (85.3 kg)     Height --      Head Circumference --      Peak Flow --      Pain Score --      Pain Loc --      Pain Edu? --  Excl. in [place]? --     Constitutional: Alert and oriented. Well appearing and in no acute distress. Eyes: Conjunctivae are normal. PERRL. EOMI. Head: Atraumatic. Nose: No congestion/rhinnorhea. Mouth/Throat: Mucous membranes are moist. Oropharynx non-erythematous. Neck: No stridor.  Cardiovascular: Tachycardia, regular rhythm. Good peripheral circulation. Grossly normal heart sounds. Respiratory: Tachypnea, positive accessory respiratory muscle use  No retractions. Bibasilar rhonchi Gastrointestinal: Soft and nontender. No distention.  Musculoskeletal: No lower extremity tenderness nor edema. No gross deformities of extremities. Neurologic:  Normal speech and language. No gross focal neurologic deficits are appreciated.  Skin:  Skin is warm, dry and intact. No rash noted.   ____________________________________________   LABS (all labs ordered are listed, but only abnormal results are displayed)  Labs Reviewed  BASIC METABOLIC PANEL - Abnormal; Notable for the following:       Result Value   Potassium 3.3 (*)    Creatinine, Ser 1.02 (*)    Calcium 8.5 (*)    GFR calc non Af Amer 51 (*)    GFR calc Af Amer 59 (*)    All other components within normal limits  CBC - Abnormal; Notable for the following:    WBC 11.8 (*)    Hemoglobin 10.6 (*)    HCT 32.6 (*)    MCV 79.7 (*)    RDW 16.7 (*)    All other components within normal limits  TROPONIN I - Abnormal;  Notable for the following:    Troponin I 0.03 (*)    All other components within normal limits  INFLUENZA PANEL BY PCR (TYPE A & B)  BLOOD GAS, VENOUS   ____________________________________________  EKG  ED ECG REPORT I, Grifton N Alivia Cimino, the attending physician, personally viewed and interpreted this ECG.   Date: 04/22/2016  EKG Time: 5:31 AM  Rate: 106  Rhythm: Sinus tachycardia  Axis: Normal  Intervals: Normal  ST&T Change: None  ____________________________________________  RADIOLOGY I, Sultana Ernst Bowler, personally viewed and evaluated these images (plain radiographs) as part of my medical decision making, as well as reviewing the written report by the radiologist.  Dg Chest Port 1 View  Result Date: 04/22/2016 CLINICAL DATA:  Cough, difficulty breathing. History of bronchitis, pneumonia, COPD. EXAM: PORTABLE CHEST 1 VIEW COMPARISON:  Chest radiograph April 18, 2015 and CT chest June 19, 2015 FINDINGS: Cardiac silhouette is mildly enlarged unchanged. Calcified aortic knob. Persistently elevated RIGHT hemidiaphragm with bibasilar strandy densities and blunting of the RIGHT costophrenic angle. No pneumothorax. Osteopenia. Soft tissue planes are unchanged. IMPRESSION: Similar without bronchitic changes and bibasilar atelectasis, less likely pneumonia. Electronically Signed   By: Elon Alas M.D.   On: 04/22/2016 06:13    Procedures     INITIAL IMPRESSION / ASSESSMENT AND PLAN / ED COURSE  Pertinent labs & imaging results that were available during my care of the patient were reviewed by me and considered in my medical decision making (see chart for details).  Patient was considerable work of breathing on arrival to the emergency department DuoNeb 2 administered as well as Solu-Medrol 125 mg.  History of physical exam concerning for pneumonia versus COPD exacerbation. Patient discussed with Dr. Hedda Slade admission for further evaluation and management     ____________________________________________  FINAL CLINICAL IMPRESSION(S) / ED DIAGNOSES  Final diagnoses:  COPD exacerbation ([place])     MEDICATIONS GIVEN DURING THIS VISIT:  Medications  azithromycin (ZITHROMAX) 500 mg in dextrose 5 % 250 mL IVPB (500 mg Intravenous New Bag/Given 04/22/16 0646)  ipratropium-albuterol (DUONEB)  0.5-2.5 (3) MG/3ML nebulizer solution 3 mL (3 mLs Nebulization Given 04/22/16 0545)  ipratropium-albuterol (DUONEB) 0.5-2.5 (3) MG/3ML nebulizer solution 3 mL (3 mLs Nebulization Given 04/22/16 0545)  methylPREDNISolone sodium succinate (SOLU-MEDROL) 125 mg/2 mL injection 125 mg (125 mg Intravenous Given 04/22/16 0545)     NEW OUTPATIENT MEDICATIONS STARTED DURING THIS VISIT:  New Prescriptions   No medications on file    Modified Medications   No medications on file    Discontinued Medications   No medications on file     Note:  This document was prepared using Dragon voice recognition software and may include unintentional dictation errors.    Gregor Hams, MD 04/22/16 [phone]

## 2016-04-22 NOTE — H&P (Signed)
[facility] [place] NAME: Raven [name]    MR#:  [id number]  DATE OF BIRTH:  1934/03/17  DATE OF ADMISSION:  04/22/2016  PRIMARY CARE PHYSICIAN: [name], MD   REQUESTING/REFERRING PHYSICIAN: Dr. Owens Shark  CHIEF COMPLAINT:   Weakness and cough HISTORY OF PRESENT ILLNESS:  Raven [name]  is a 82 y.o. female with a known history of COPD with oxygen at night he was discharged on February 12 after 3 day stay for acute bronchitis with COPD exacerbation. Patient reports at the time of discharge she was feeling much better. However over the past day she has had increasing weakness with poor appetite and increasing cough. She presents to the emergency room where she was found to have bilateral wheezing. Chest x-ray shows bronchitis no evidence of pneumonia. Patient denies fever or chills.  PAST MEDICAL HISTORY:   Past Medical History:  Diagnosis Date  . Anemia   . Arthritis   . Asthma   . Complication of anesthesia   . COPD (chronic obstructive pulmonary disease) ([place])   . Crohn's disease ([place])   . Depression    after death of husband  . Hyperlipidemia   . Hypertension   . Myocardial infarction   . Osteopenia   . Peptic ulcer disease   . Pneumonia   . PONV (postoperative nausea and vomiting)     PAST SURGICAL HISTORY:   Past Surgical History:  Procedure Laterality Date  . ABDOMINAL SURGERY    . CARDIAC CATHETERIZATION     with stent  . COLONOSCOPY WITH PROPOFOL N/A 09/27/2014   Procedure: COLONOSCOPY WITH PROPOFOL;  Surgeon: [name], MD;  Location: [facility] ENDOSCOPY;  Service: Gastroenterology;  Laterality: N/A;  . EYE SURGERY     bilateral cataract surgeries  . SHOULDER SURGERY Right    x 2   . SMALL INTESTINE SURGERY      SOCIAL HISTORY:   Social History  Substance Use Topics  . Smoking status: Former Smoker    Quit date: 1974  . Smokeless tobacco: Never Used  . Alcohol use Yes     Comment: occassional     FAMILY HISTORY:   Family History  Problem Relation Age of Onset  . Hypertension      DRUG ALLERGIES:   Allergies  Allergen Reactions  . Penicillins Other (See Comments)    Reaction:  Unknown  Has patient had a PCN reaction causing immediate rash, facial/tongue/throat swelling, SOB or lightheadedness with hypotension: unknown Has patient had a PCN reaction causing severe rash involving mucus membranes or skin necrosis: unknown Has patient had a PCN reaction that required hospitalization No Has patient had a PCN reaction occurring within the last 10 years: No If all of the above answers are "NO", then may proceed with Cephalosporin use.   . Augmentin [Amoxicillin-Pot Clavulanate] Other (See Comments)    Reaction:  Unknown   . Indocin [Indomethacin] Other (See Comments)    Reaction:  Unknown   . Lodine [Etodolac] Other (See Comments)    Reaction:   GI upset   . Methotrexate Derivatives Other (See Comments)    Reaction:  GI upset   . Gold Rash  . Zantac [Ranitidine Hcl] Rash    REVIEW OF SYSTEMS:   Review of Systems  Constitutional: Positive for malaise/fatigue. Negative for chills and fever.  HENT: Negative.  Negative for ear discharge, ear pain, hearing loss, nosebleeds and sore throat.   Eyes: Negative.  Negative for  blurred vision and pain.  Respiratory: Positive for cough, shortness of breath and wheezing. Negative for hemoptysis.   Cardiovascular: Negative.  Negative for chest pain, palpitations and leg swelling.  Gastrointestinal: Negative.  Negative for abdominal pain, blood in stool, diarrhea, nausea and vomiting.  Genitourinary: Negative.  Negative for dysuria.  Musculoskeletal: Negative.  Negative for back pain.  Skin: Negative.   Neurological: Positive for weakness. Negative for dizziness, tremors, speech change, focal weakness, seizures and headaches.  Endo/Heme/Allergies: Negative.  Does not bruise/bleed easily.  Psychiatric/Behavioral: Negative.  Negative  for depression, hallucinations and suicidal ideas.    MEDICATIONS AT HOME:   Prior to Admission medications   Medication Sig Start Date End Date Taking? Authorizing Provider  acetaminophen (TYLENOL) 500 MG tablet Take 1,000 mg by mouth See admin instructions. Takes 2 tablets at night and during the day only if needed for pain   Yes Historical Provider, MD  Adalimumab (HUMIRA) 40 MG/0.8ML PSKT Inject 40 mg into the skin every 14 (fourteen) days.    Yes Historical Provider, MD  albuterol (PROVENTIL HFA;VENTOLIN HFA) 108 (90 BASE) MCG/ACT inhaler Inhale 2 puffs into the lungs every 6 (six) hours as needed for wheezing or shortness of breath.   Yes Historical Provider, MD  amitriptyline (ELAVIL) 10 MG tablet Take 20 mg by mouth at bedtime.   Yes Historical Provider, MD  amLODipine-benazepril (LOTREL) 5-20 MG per capsule Take 1 capsule by mouth daily.    Yes Historical Provider, MD  aspirin EC 81 MG tablet Take 81 mg by mouth daily.   Yes Historical Provider, MD  atorvastatin (LIPITOR) 80 MG tablet Take 80 mg by mouth at bedtime.    Yes Historical Provider, MD  benzonatate (TESSALON) 100 MG capsule Take by mouth 3 (three) times daily as needed for cough.   Yes Historical Provider, MD  Calcium-Phosphorus-Vitamin D (CITRACAL +D3 PO) Take 1 tablet by mouth daily.   Yes Historical Provider, MD  cefUROXime (CEFTIN) 250 MG tablet Take 250 mg by mouth 2 (two) times daily with a meal. For 10 days   Yes Historical Provider, MD  Chlorpheniramine Maleate (ALLERGY PO) Take 1 tablet by mouth daily.   Yes Historical Provider, MD  clopidogrel (PLAVIX) 75 MG tablet Take 75 mg by mouth daily.   Yes Historical Provider, MD  Cyanocobalamin (B-12 PO) Take 1 tablet by mouth daily. Triple B-12   Yes Historical Provider, MD  Fluticasone-Salmeterol (ADVAIR) 250-50 MCG/DOSE AEPB Inhale 1 puff into the lungs 2 (two) times daily.   Yes Historical Provider, MD  guaiFENesin-dextromethorphan (ROBITUSSIN DM) 100-10 MG/5ML syrup  Take 5 mLs by mouth every 4 (four) hours as needed for cough. 04/20/16  Yes Vipul Manuella Ghazi, MD  hydrochlorothiazide (HYDRODIURIL) 25 MG tablet Take 25 mg by mouth daily.    Yes Historical Provider, MD  levofloxacin (LEVAQUIN) 500 MG tablet Take 1 tablet (500 mg total) by mouth daily. 04/20/16  Yes Vipul Manuella Ghazi, MD  mesalamine (PENTASA) 500 MG CR capsule Take 1,000 mg by mouth 2 (two) times daily.   Yes Historical Provider, MD  metoprolol succinate (TOPROL-XL) 25 MG 24 hr tablet Take 25 mg by mouth daily.   Yes Historical Provider, MD  Multiple Vitamin (MULTIVITAMIN WITH MINERALS) TABS tablet Take 1 tablet by mouth daily.   Yes Historical Provider, MD  nitroGLYCERIN (NITROSTAT) 0.4 MG SL tablet Place 0.4 mg under the tongue every 5 (five) minutes as needed for chest pain.   Yes Historical Provider, MD  potassium chloride (K-DUR,KLOR-CON) 10 MEQ tablet Take  10 mEq by mouth daily.    Yes Historical Provider, MD  predniSONE (DELTASONE) 1 MG tablet Take 2 mg by mouth daily.    Yes Historical Provider, MD  risedronate (ACTONEL) 150 MG tablet Take 150 mg by mouth every 30 (thirty) days. with water on empty stomach, nothing by mouth or lie down for next 30 minutes.   Yes Historical Provider, MD  senna (SENOKOT) 8.6 MG tablet Take 2 tablets by mouth at bedtime.    Yes Historical Provider, MD  thiamine (VITAMIN B-1) 100 MG tablet Take 100 mg by mouth daily.   Yes Historical Provider, MD  vitamin E 400 UNIT capsule Take 400 Units by mouth daily.   Yes Historical Provider, MD      VITAL SIGNS:  Blood pressure 135/64, pulse (!) 106, temperature 98.3 F (36.8 C), temperature source Oral, resp. rate (!) 23, weight 85.3 kg (188 lb), SpO2 96 %.  PHYSICAL EXAMINATION:   Physical Exam  Constitutional: She is oriented to person, place, and time and well-developed, well-nourished, and in no distress. No distress.  HENT:  Head: Normocephalic.  Eyes: No scleral icterus.  Neck: Normal range of motion. Neck supple. No JVD  present. No tracheal deviation present.  Cardiovascular: Normal rate, regular rhythm and normal heart sounds.  Exam reveals no gallop and no friction rub.   No murmur heard. Pulmonary/Chest: Effort normal. No respiratory distress. She has wheezes. She has no rales. She exhibits no tenderness.  Abdominal: Soft. Bowel sounds are normal. She exhibits no distension and no mass. There is no tenderness. There is no rebound and no guarding.  Musculoskeletal: Normal range of motion. She exhibits no edema.  Neurological: She is alert and oriented to person, place, and time.  Skin: Skin is warm. No rash noted. No erythema.  Psychiatric: Affect and judgment normal.      LABORATORY PANEL:   CBC  Recent Labs Lab 04/22/16 0535  WBC 11.8*  HGB 10.6*  HCT 32.6*  PLT 275   ------------------------------------------------------------------------------------------------------------------  Chemistries   Recent Labs Lab 04/17/16 1601  04/22/16 0535  NA 135  < > 136  K 3.5  < > 3.3*  CL 100*  < > 101  CO2 26  < > 27  GLUCOSE 88  < > 94  BUN 18  < > 18  CREATININE 1.05*  < > 1.02*  CALCIUM 8.5*  < > 8.5*  AST 33  --   --   ALT 28  --   --   ALKPHOS 69  --   --   BILITOT 0.5  --   --   < > = values in this interval not displayed. ------------------------------------------------------------------------------------------------------------------  Cardiac Enzymes  Recent Labs Lab 04/22/16 0535  TROPONINI 0.03*   ------------------------------------------------------------------------------------------------------------------  RADIOLOGY:  Dg Chest Port 1 View  Result Date: 04/22/2016 CLINICAL DATA:  Cough, difficulty breathing. History of bronchitis, pneumonia, COPD. EXAM: PORTABLE CHEST 1 VIEW COMPARISON:  Chest radiograph April 18, 2015 and CT chest June 19, 2015 FINDINGS: Cardiac silhouette is mildly enlarged unchanged. Calcified aortic knob. Persistently elevated RIGHT  hemidiaphragm with bibasilar strandy densities and blunting of the RIGHT costophrenic angle. No pneumothorax. Osteopenia. Soft tissue planes are unchanged. IMPRESSION: Similar without bronchitic changes and bibasilar atelectasis, less likely pneumonia. Electronically Signed   By: Elon Alas M.D.   On: 04/22/2016 06:13    EKG:   Sinus tachycardia heart rate 106  IMPRESSION AND PLAN:   82 year old female with a history of COPD on  oxygen at night who presents with generalized weakness and acute COPD exacerbation with acute bronchitis.   1. Acute COPD exacerbation with acute bronchitis: Start IV steroids and IV Levaquin Repeat chest x-ray in a.m. after IV fluids to evaluate for pneumonia. Continue oxygen and DuoNeb's.  2. Hypokalemia due to poor by mouth intake Replete and check magnesium level  3. Sinus tachycardia due to COPD exacerbation and DuoNeb's Continue telemetry monitor  4. Essential hypertension: Continue HCTZ, Norvasc, Benzapril and metoprolol  5. Hyperlipidemia: Continue atorvastatin 6. History of Crohn's disease: Continue mesalamine She is also on 2 mg of prednisone daily which she will need to restart after IV steroids completed.      All the records are reviewed and case discussed with ED provider. Management plans discussed with the patient and she is in agreement  CODE STATUS: DO NOT RESUSCITATE  TOTAL TIME TAKING CARE OF THIS PATIENT: 45 minutes.    Dyasia Firestine M.D on 04/22/2016 at 7:38 AM  Between 7am to 6pm - Pager - [phone]  After 6pm go to [email or web address] - password [email or web address] [facility]  Office  [phone]  CC: Primary care physician; [name], MD

## 2016-04-22 NOTE — Progress Notes (Signed)
MEDICATION RELATED CONSULT NOTE - INITIAL   Pt. Home med risedronate is a restricted med. Will not be able to provide while patient is in house per restricted medication policy.  Tobie Lords, PharmD, BCPS Clinical Pharmacist 04/22/2016

## 2016-04-22 NOTE — Progress Notes (Signed)
PT Cancellation Note  Patient Details Name: Raven [name] MRN: [id number] DOB: [birth year]   Cancelled Treatment:    Reason Eval/Treat Not Completed: Patient's level of consciousness. Patient is fast asleep with towel over her eyes and room blacked out. Will try again at later time/date as patient is more alert and oriented to participate in therapy.   Royce Macadamia PT, DPT, CSCS    04/22/2016, 2:01 PM

## 2016-04-23 ENCOUNTER — Inpatient Hospital Stay: Payer: Medicare Other

## 2016-04-23 LAB — CBC
HCT: 28.3 % — ABNORMAL LOW (ref 35.0–47.0)
Hemoglobin: 9.3 g/dL — ABNORMAL LOW (ref 12.0–16.0)
MCH: 25.9 pg — ABNORMAL LOW (ref 26.0–34.0)
MCHC: 33 g/dL (ref 32.0–36.0)
MCV: 78.5 fL — ABNORMAL LOW (ref 80.0–100.0)
Platelets: 277 10*3/uL (ref 150–440)
RBC: 3.6 MIL/uL — ABNORMAL LOW (ref 3.80–5.20)
RDW: 16.8 % — ABNORMAL HIGH (ref 11.5–14.5)
WBC: 13.2 10*3/uL — ABNORMAL HIGH (ref 3.6–11.0)

## 2016-04-23 LAB — BASIC METABOLIC PANEL
Anion gap: 8 (ref 5–15)
BUN: 13 mg/dL (ref 6–20)
CO2: 27 mmol/L (ref 22–32)
Calcium: 8.5 mg/dL — ABNORMAL LOW (ref 8.9–10.3)
Chloride: 101 mmol/L (ref 101–111)
Creatinine, Ser: 0.81 mg/dL (ref 0.44–1.00)
GFR calc Af Amer: >60 mL/min (ref >60)
GFR calc non Af Amer: >60 mL/min (ref >60)
Glucose, Bld: 203 mg/dL — ABNORMAL HIGH (ref 65–99)
Potassium: 4 mmol/L (ref 3.5–5.1)
Sodium: 136 mmol/L (ref 135–145)

## 2016-04-23 MED ORDER — IPRATROPIUM-ALBUTEROL 0.5-2.5 (3) MG/3ML IN SOLN
3.0000 mL | Freq: Three times a day (TID) | RESPIRATORY_TRACT | Status: DC
  Administered 2016-04-23 – 2016-04-24 (×3): 3 mL via RESPIRATORY_TRACT
  Filled 2016-04-23 (×4): qty 3

## 2016-04-23 MED ORDER — IPRATROPIUM-ALBUTEROL 0.5-2.5 (3) MG/3ML IN SOLN: 3.0000 mL | Freq: Four times a day (QID) | RESPIRATORY_TRACT | Status: DC | PRN

## 2016-04-23 MED ORDER — PANTOPRAZOLE SODIUM 40 MG PO TBEC
40.0000 mg | DELAYED_RELEASE_TABLET | Freq: Every day | ORAL | Status: DC
  Administered 2016-04-23 – 2016-04-24 (×2): 40 mg via ORAL
  Filled 2016-04-23 (×3): qty 1

## 2016-04-23 MED ORDER — LEVOFLOXACIN IN D5W 750 MG/150ML IV SOLN
750.0000 mg | INTRAVENOUS | Status: DC
  Administered 2016-04-23 – 2016-04-24 (×2): 750 mg via INTRAVENOUS
  Filled 2016-04-23 (×4): qty 150

## 2016-04-23 MED ORDER — GUAIFENESIN ER 600 MG PO TB12
600.0000 mg | ORAL_TABLET | Freq: Two times a day (BID) | ORAL | Status: DC
  Administered 2016-04-23: 600 mg via ORAL
  Filled 2016-04-23 (×2): qty 1

## 2016-04-23 MED ORDER — METHYLPREDNISOLONE SODIUM SUCC 40 MG IJ SOLR
40.0000 mg | Freq: Two times a day (BID) | INTRAMUSCULAR | Status: DC
  Administered 2016-04-23 – 2016-04-24 (×2): 40 mg via INTRAVENOUS
  Filled 2016-04-23 (×2): qty 1

## 2016-04-23 NOTE — Plan of Care (Signed)
Problem: Safety: Goal: Ability to remain free from injury will improve Outcome: Progressing oob with assist to chair. Pt  Up to date on vaccines  Problem: Nutrition: Goal: Adequate nutrition will be maintained Outcome: Progressing Encouraging to eat more  Problem: Respiratory: Goal: Ability to maintain a clear airway will improve Outcome: Progressing 02 cont  Enc ues of inc spir.

## 2016-04-23 NOTE — Progress Notes (Signed)
Inpatient Diabetes Program Recommendations  AACE/ADA: New Consensus Statement on Inpatient Glycemic Control (2015)  Target Ranges:  Prepandial:   less than 140 mg/dL      Peak postprandial:   less than 180 mg/dL (1-2 hours)      Critically ill patients:  140 - 180 mg/dL   No results found for: GLUCAP, HGBA1C  Review of Glycemic Control  Elevated lab glucose- no hx diabetes but patient is on steroids  Results for BENNA, ARNO (MRN [id number]) as of 04/23/2016 13:13  Ref. Range 04/23/2016 08:16  Glucose Latest Ref Range: 65 - 99 mg/dL 203 (H)    Diabetes history: none Outpatient Diabetes medications: none Current orders for Inpatient glycemic control: none  Inpatient Diabetes Program Recommendations:  consider CBG tid and hs.     Per ADA recommendations "consider performing an A1C on all patients with diabetes or hyperglycemia admitted to the hospital if not performed in the prior 3 months".   Gentry Fitz, RN, BA, MHA, CDE Diabetes Coordinator Inpatient Diabetes Program  [phone] (Team Pager) [phone] ([place]) 04/23/2016 1:13 PM

## 2016-04-23 NOTE — Progress Notes (Signed)
PHARMACY NOTE:  ANTIMICROBIAL RENAL DOSAGE ADJUSTMENT  Current antimicrobial regimen includes a mismatch between antimicrobial dosage and estimated renal function.  As per policy approved by the Pharmacy & Therapeutics and Medical Executive Committees, the antimicrobial dosage will be adjusted accordingly.  Current antimicrobial dosage:  Levaquin 768m IV Q48h  Indication: AECOPD w/ CAP  Renal Function:Crcl now > 50 ml/min  Estimated Creatinine Clearance: 59.1 mL/min (by C-G formula based on SCr of 0.81 mg/dL).     Antimicrobial dosage has been changed to:  Levaquin 7557mIV Q24h  Additional comments:   Thank you for allowing pharmacy to be a part of this patient's care.  [facility]/15/2018 9:31 AM

## 2016-04-23 NOTE — Progress Notes (Signed)
Initial Nutrition Assessment  DOCUMENTATION CODES:   Severe malnutrition in context of acute illness/injury  INTERVENTION:  Encouraged adequate intake of calories and protein from meals, snacks, and beverages. Reviewed menu with patient and discussed options.  Provide snacks po BID between meals (yogurt). Ordered by RD.   NUTRITION DIAGNOSIS:   Malnutrition (Severe) related to acute illness (AECOPD/CAP) as evidenced by energy intake < or equal to 50% for > or equal to 5 days, 3.3 percent weight loss over 1 week.  GOAL:   Patient will meet greater than or equal to 90% of their needs  MONITOR:   PO intake, Labs, I & O's, Weight trends  REASON FOR ASSESSMENT:   Malnutrition Screening Tool    ASSESSMENT:   82 year old female with PMHx of COPD, Crohn's disease, HTN, HLD, MI, PUD who presented with generalized weakness and SOB found to have AECOPD with CAP.    Spoke with patient and two family members at bedside. Patient reports she has had a poor appetite for the past week. She has been experiencing early satiety with meals and nausea. Patient denies vomiting, constipation/diarrhea, or difficulty chewing/swallowing. She reports for the past week she has been attempting to eat 2 meals a day but only able to take a few bites of food. Usual intake is 2 meals per day (patient did not provide further details on usual intake but reports she eats well). After discussion regarding importance of eating adequate calories and protein to prevent further weight loss and loss of lean body mass, patient reports she will eat more to prevent loss of muscle mass. Patient reports she does not want to become weak. She does not want to start Ensure yet, but is amenable to eating yogurt BID between meals.   UBW 184-188 lbs. Unsure of accuracy of weight from 04/17/2016. If correct, patient has lost 12 lbs (6.4% body weight) over 1 week, which is significant for time frame. Even weight loss of 3.3% body weight  from lower weight on 2/5 is significant weight loss.   Medications reviewed and include: calcium citrate 500 mg vitamin D 400 units tablet daily, methylprednisolone 40 mg Q12hrs, multivitamin with minerals daily, pantoprazole, potassium chloride 10 mEq daily, senna, thiamine 100 mg daily, vitamin E 400 units daily, NS @ 50 ml/hr.   Labs reviewed: Glucose 203 (no CBGs), elevated Troponin.   Nutrition-Focused physical exam completed. Findings are no fat depletion, no muscle depletion, and no edema.   Diet Order:  Diet Heart Room service appropriate? Yes; Fluid consistency: Thin  Skin:  Reviewed, no issues  Last BM:  04/22/2016  Height:   Ht Readings from Last 1 Encounters:  04/22/16 5' 6"  (1.676 m)    Weight:   Wt Readings from Last 1 Encounters:  04/22/16 176 lb 8 oz (80.1 kg)    Ideal Body Weight:  59.1 kg  BMI:  Body mass index is 28.49 kg/m.  Estimated Nutritional Needs:   Kcal:  1550-1810 (MSJ x 1.2-1.4)  Protein:  80-95 grams (1-1.2 grams/kg)  Fluid:  2 L/day (25 ml/kg)  EDUCATION NEEDS:   Education needs addressed  Willey Blade, MS, RD, LDN Pager: [phone] After Hours Pager: [phone]

## 2016-04-23 NOTE — Evaluation (Signed)
Physical Therapy Evaluation Patient Details Name: Raven [name] MRN: [id number] DOB: [birth year] Today's Date: 04/23/2016   History of Present Illness  Pt with diagnosis of COPD with complaints of weakness and cough. Pt with recent discharge secondary to COPD and bronchitis. Pt with history of COPD and wears O2 at night.   Clinical Impression  Pt is a pleasant 82 year old female who was admitted for COPD with complaints of weakness and cough. Pt performs bed mobility/transfers independently and ambulation with cga and no AD. Pt fatigues quickly secondary to decreased O2 sats with exertion. Sats improve with O2 applied. Pt demonstrates deficits with endurance/mobility. Would benefit from skilled PT to address above deficits and promote optimal return to PLOF. Recommend transition to [place] upon discharge from acute hospitalization.       Follow Up Recommendations Home health PT    Equipment Recommendations  None recommended by PT    Recommendations for Other Services       Precautions / Restrictions Precautions Precautions: Fall Restrictions Weight Bearing Restrictions: No      Mobility  Bed Mobility Overal bed mobility: Independent             General bed mobility comments: safe technique performed  Transfers Overall transfer level: Independent Equipment used: None             General transfer comment: safe technique performed without AD required  Ambulation/Gait Ambulation/Gait assistance: Min guard Ambulation Distance (Feet): 90 Feet Assistive device: None Gait Pattern/deviations: Decreased step length - right;Decreased step length - left     General Gait Details: ambulated with slow gait speed and decreased step length in hallway. Pt slightly fatigued. All ambulation performed on room air with sats decreasing to 76% with exertion. Pt cued for pursed lip breathing. Once seated, 2L of O2 applied with sats quickly improving to 93%. Reciprocal gait pattern  performed  Stairs            Wheelchair Mobility    Modified Rankin (Stroke Patients Only)       Balance Overall balance assessment: Independent                                           Pertinent Vitals/Pain Pain Assessment: No/denies pain    Home Living Family/patient expects to be discharged to:: Private residence Living Arrangements: Alone   Type of Home: House Home Access: Stairs to enter Entrance Stairs-Rails: None Entrance Stairs-Number of Steps: 2 Home Layout: One level Home Equipment: Environmental consultant - 2 wheels;Cane - single point;Wheelchair - manual      Prior Function Level of Independence: Independent         Comments: active and independent in home environment     Hand Dominance        Extremity/Trunk Assessment   Upper Extremity Assessment Upper Extremity Assessment: Overall WFL for tasks assessed    Lower Extremity Assessment Lower Extremity Assessment: Overall WFL for tasks assessed       Communication   Communication: No difficulties  Cognition Arousal/Alertness: Awake/alert Behavior During Therapy: WFL for tasks assessed/performed Overall Cognitive Status: Within Functional Limits for tasks assessed                      General Comments      Exercises     Assessment/Plan    PT Assessment Patient needs continued PT services  PT Problem List Decreased activity tolerance;Cardiopulmonary status limiting activity;Decreased knowledge of precautions          PT Treatment Interventions Gait training;Stair training;Therapeutic exercise    PT Goals (Current goals can be found in the Care Plan section)  Acute Rehab PT Goals Patient Stated Goal: to get stronger PT Goal Formulation: With patient Time For Goal Achievement: 05/07/16 Potential to Achieve Goals: Good    Frequency Min 2X/week   Barriers to discharge        Co-evaluation               End of Session Equipment Utilized During  Treatment: Gait belt;Oxygen Activity Tolerance: Treatment limited secondary to medical complications (Comment) Patient left: in chair;with chair alarm set Nurse Communication: Mobility status         Time: 9563-8756 PT Time Calculation (min) (ACUTE ONLY): 13 min   Charges:   PT Evaluation $PT Eval Low Complexity: 1 Procedure     PT G Codes:        Mukhtar Shams 05-12-16, 10:36 AM  Greggory Stallion, PT, DPT [phone]

## 2016-04-23 NOTE — Care Management Note (Signed)
Case Management Note  Patient Details  Name: Raven [name] MRN: [id number] Date of Birth: 02-26-1935  Subjective/Objective:  Pt recommending home health PT. Spoke with patient regarding discharge planning. She lives at home alone. Prior to admission she was independent, active and driving. She plans to return to this but realizes that she will need a few weeks of recovery prior to getting out. She uses no ambulatory DME. Is on O2 that she has been using continuously recently through [facility]. She is agreeable to home health. I feels she would benefit from a nurse as well as PT. No agency preference. Referral to [facility] with [facility] for HHPT and nursing.                   Action/Plan:   Expected Discharge Date:                  Expected Discharge Plan:  [place]  In-House Referral:     Discharge planning Services  CM Consult  Post Acute Care Choice:  Home Health Choice offered to:  Patient  DME Arranged:    DME Agency:     HH Arranged:  RN, PT [place] Agency:  [place]  Status of Service:  In process, will continue to follow  If discussed at Long Length of Stay Meetings, dates discussed:    Additional Comments:  Jolly Mango, RN 04/23/2016, 2:09 PM

## 2016-04-23 NOTE — Progress Notes (Signed)
[place] [facility] [place] NAME: Raven [name]    MR#:  [id number]  DATE OF BIRTH:  09/29/1934  SUBJECTIVE:   Feeling better than yesterday. Still has some cough and SOB    REVIEW OF SYSTEMS:    Review of Systems  Respiratory: Positive for cough and shortness of breath.     Tolerating Diet: yes      DRUG ALLERGIES:   Allergies  Allergen Reactions  . Penicillins Other (See Comments)    Reaction:  Unknown  Has patient had a PCN reaction causing immediate rash, facial/tongue/throat swelling, SOB or lightheadedness with hypotension: unknown Has patient had a PCN reaction causing severe rash involving mucus membranes or skin necrosis: unknown Has patient had a PCN reaction that required hospitalization No Has patient had a PCN reaction occurring within the last 10 years: No If all of the above answers are "NO", then may proceed with Cephalosporin use.   . Augmentin [Amoxicillin-Pot Clavulanate] Other (See Comments)    Reaction:  Unknown   . Indocin [Indomethacin] Other (See Comments)    Reaction:  Unknown   . Lodine [Etodolac] Other (See Comments)    Reaction:   GI upset   . Methotrexate Derivatives Other (See Comments)    Reaction:  GI upset   . Gold Rash  . Zantac [Ranitidine Hcl] Rash    VITALS:  Blood pressure (!) 130/53, pulse 89, temperature 98.8 F (37.1 C), temperature source Oral, resp. rate 18, height 5' 6"  (1.676 m), weight 80.1 kg (176 lb 8 oz), SpO2 96 %.  PHYSICAL EXAMINATION:   Physical Exam    LABORATORY PANEL:   CBC  Recent Labs Lab 04/22/16 0535  WBC 11.8*  HGB 10.6*  HCT 32.6*  PLT 275   ------------------------------------------------------------------------------------------------------------------  Chemistries   Recent Labs Lab 04/17/16 1601  04/22/16 0535  NA 135  < > 136  K 3.5  < > 3.3*  CL 100*  < > 101  CO2 26  < > 27  GLUCOSE 88  < > 94  BUN 18  < > 18  CREATININE 1.05*  < >  1.02*  CALCIUM 8.5*  < > 8.5*  MG  --   --  1.7  AST 33  --   --   ALT 28  --   --   ALKPHOS 69  --   --   BILITOT 0.5  --   --   < > = values in this interval not displayed. ------------------------------------------------------------------------------------------------------------------  Cardiac Enzymes  Recent Labs Lab 04/17/16 1601 04/22/16 0535  TROPONINI 0.03* 0.03*   ------------------------------------------------------------------------------------------------------------------  RADIOLOGY:  X-ray Chest Pa And Lateral  Result Date: 04/23/2016 CLINICAL DATA:  Shortness of breath . EXAM: CHEST  2 VIEW COMPARISON:  04/22/2016. FINDINGS: Mediastinum and hilar structures normal. Low lung volumes with mild basilar atelectasis and infiltrates. Small right pleural effusion. Heart size normal . IMPRESSION: 1. Low lung volumes with mild basilar atelectasis and infiltrates. Similar findings noted on prior exam. 2. Small right pleural effusion . Electronically Signed   By: Marcello Moores  Register   On: 04/23/2016 07:50   Dg Chest Port 1 View  Result Date: 04/22/2016 CLINICAL DATA:  Cough, difficulty breathing. History of bronchitis, pneumonia, COPD. EXAM: PORTABLE CHEST 1 VIEW COMPARISON:  Chest radiograph April 18, 2015 and CT chest June 19, 2015 FINDINGS: Cardiac silhouette is mildly enlarged unchanged. Calcified aortic knob. Persistently elevated RIGHT hemidiaphragm with bibasilar strandy densities and blunting of the RIGHT  costophrenic angle. No pneumothorax. Osteopenia. Soft tissue planes are unchanged. IMPRESSION: Similar without bronchitic changes and bibasilar atelectasis, less likely pneumonia. Electronically Signed   By: Elon Alas M.D.   On: 04/22/2016 06:13     ASSESSMENT AND PLAN:    82 year old female with a history of COPD on oxygen at night who presents with generalized weakness and SOB  1. Acute COPD exacerbation with CAP: Continue to wean IV steroids and  continue Levaquin CXR this am confirms PNA. Will order ISS as well Continue oxygen and DuoNeb's.  2. Hypokalemia Replete PRN AM labs pending  3. Sinus tachycardia due to COPD exacerbation and DuoNeb's Continue telemetry monitor  4. Essential hypertension: Continue HCTZ, Norvasc, Benzapril and metoprolol  5. Hyperlipidemia: Continue atorvastatin  6. History of Crohn's disease: Continue mesalamine She is also on 2 mg of prednisone daily which she will need to restart after IV steroids completed   7. GERD: Continue PPI.  PT consult for disposition  Management plans discussed with the patient and she is in agreement.  CODE STATUS: FULL  TOTAL TIME TAKING CARE OF THIS PATIENT: 30 minutes.     POSSIBLE D/C tomorrow, DEPENDING ON CLINICAL CONDITION.   Harjot Dibello M.D on 04/23/2016 at 8:20 AM  Between 7am to 6pm - Pager - [phone] After 6pm go to [email or web address] - password [email or web address] [facility]  Office  [phone]  CC: Primary care physician; [name], MD  Note: This dictation was prepared with Dragon dictation along with smaller phrase technology. Any transcriptional errors that result from this process are unintentional.

## 2016-04-24 LAB — BLOOD GAS, VENOUS
Acid-Base Excess: 2.7 mmol/L — ABNORMAL HIGH (ref 0.0–2.0)
Bicarbonate: 29.5 mmol/L — ABNORMAL HIGH (ref 20.0–28.0)
Patient temperature: 37
pCO2, Ven: 56 mmHg (ref 44.0–60.0)
pH, Ven: 7.33 (ref 7.250–7.430)

## 2016-04-24 LAB — BASIC METABOLIC PANEL
Anion gap: 7 (ref 5–15)
BUN: 23 mg/dL — ABNORMAL HIGH (ref 6–20)
CO2: 27 mmol/L (ref 22–32)
Calcium: 8.1 mg/dL — ABNORMAL LOW (ref 8.9–10.3)
Chloride: 103 mmol/L (ref 101–111)
Creatinine, Ser: 0.96 mg/dL (ref 0.44–1.00)
GFR calc Af Amer: >60 mL/min (ref >60)
GFR calc non Af Amer: 54 mL/min — ABNORMAL LOW (ref >60)
Glucose, Bld: 201 mg/dL — ABNORMAL HIGH (ref 65–99)
Potassium: 4.4 mmol/L (ref 3.5–5.1)
Sodium: 137 mmol/L (ref 135–145)

## 2016-04-24 MED ORDER — PREDNISONE 10 MG (21) PO TBPK: 10.0000 mg | ORAL_TABLET | Freq: Every day | ORAL | 0 refills | Status: DC

## 2016-04-24 MED ORDER — LEVOFLOXACIN 750 MG PO TABS: 750.0000 mg | ORAL_TABLET | Freq: Every day | ORAL | 0 refills | Status: DC

## 2016-04-24 NOTE — Progress Notes (Signed)
Discharge instructions given and went over with patient at bedside. Prescriptions given and reviewed. All questions answered. Patient discharged home with son via wheelchair by nursing staff. Madlyn Frankel, RN

## 2016-04-24 NOTE — Discharge Summary (Signed)
[facility] NAME: Raven [name]    MR#:  [id number]  DATE OF BIRTH:  1935/01/06  DATE OF ADMISSION:  04/22/2016 ADMITTING PHYSICIAN: Bettey Costa, MD  DATE OF DISCHARGE: 04/24/2016  PRIMARY CARE PHYSICIAN: [name], MD    ADMISSION DIAGNOSIS:  COPD exacerbation ([place]) [J44.1]  DISCHARGE DIAGNOSIS:  Active Problems:   COPD (chronic obstructive pulmonary disease) ([place])   SECONDARY DIAGNOSIS:   Past Medical History:  Diagnosis Date  . Anemia   . Arthritis   . Asthma   . Complication of anesthesia   . COPD (chronic obstructive pulmonary disease) ([place])   . Crohn's disease ([place])   . Depression    after death of husband  . Hyperlipidemia   . Hypertension   . Myocardial infarction   . Osteopenia   . Peptic ulcer disease   . Pneumonia   . PONV (postoperative nausea and vomiting)     HOSPITAL COURSE:   82 year old female with a history of COPD on oxygen at night who presents with generalized weakness and SOB  1. Acute COPD exacerbation with CAP:She was initially started on IV steroids and IV Levaquin. Chest x-ray done on day 2 of hospitalization did show pneumonia. Her symptoms have much improved. She was discharged with the steroid taper and then she may resume her home dose of steroids. She will continue with Levaquin. She will continue with inhalers. She will outpatient pulmonary follow-up next week    2. Hypokalemia : This was repleted and has improved  3. Sinus tachycardia due to COPD exacerbation and DuoNeb's  4. Essential hypertension: She will Continue HCTZ, Norvasc, Benzapril and metoprolol  5. Hyperlipidemia: Continue atorvastatin  6. History of Crohn's disease: Continue mesalamine She is also on 2 mg of prednisone daily which she will need to restart after IV steroids completed   7. GERD: Continue PPI.   DISCHARGE CONDITIONS AND DIET:   Stable Cardiac diet  CONSULTS OBTAINED:     DRUG ALLERGIES:   Allergies  Allergen Reactions  . Penicillins Other (See Comments)    Reaction:  Unknown  Has patient had a PCN reaction causing immediate rash, facial/tongue/throat swelling, SOB or lightheadedness with hypotension: unknown Has patient had a PCN reaction causing severe rash involving mucus membranes or skin necrosis: unknown Has patient had a PCN reaction that required hospitalization No Has patient had a PCN reaction occurring within the last 10 years: No If all of the above answers are "NO", then may proceed with Cephalosporin use.   . Augmentin [Amoxicillin-Pot Clavulanate] Other (See Comments)    Reaction:  Unknown   . Indocin [Indomethacin] Other (See Comments)    Reaction:  Unknown   . Lodine [Etodolac] Other (See Comments)    Reaction:   GI upset   . Methotrexate Derivatives Other (See Comments)    Reaction:  GI upset   . Gold Rash  . Zantac [Ranitidine Hcl] Rash    DISCHARGE MEDICATIONS:   Current Discharge Medication List    START taking these medications   Details  predniSONE (STERAPRED UNI-PAK 21 TAB) 10 MG (21) TBPK tablet Take 1 tablet (10 mg total) by mouth daily. As directed on box then resume your home dose of prednisone Qty: 21 tablet, Refills: 0      CONTINUE these medications which have CHANGED   Details  levofloxacin (LEVAQUIN) 750 MG tablet Take 1 tablet (750 mg total) by mouth daily. Qty: 5 tablet, Refills: 0  CONTINUE these medications which have NOT CHANGED   Details  acetaminophen (TYLENOL) 500 MG tablet Take 1,000 mg by mouth See admin instructions. Takes 2 tablets at night and during the day only if needed for pain    Adalimumab (HUMIRA) 40 MG/0.8ML PSKT Inject 40 mg into the skin every 14 (fourteen) days.     albuterol (PROVENTIL HFA;VENTOLIN HFA) 108 (90 BASE) MCG/ACT inhaler Inhale 2 puffs into the lungs every 6 (six) hours as needed for wheezing or shortness of breath.    amitriptyline (ELAVIL) 10 MG tablet  Take 20 mg by mouth at bedtime.    amLODipine-benazepril (LOTREL) 5-20 MG per capsule Take 1 capsule by mouth daily.     aspirin EC 81 MG tablet Take 81 mg by mouth daily.    atorvastatin (LIPITOR) 80 MG tablet Take 80 mg by mouth at bedtime.     benzonatate (TESSALON) 100 MG capsule Take by mouth 3 (three) times daily as needed for cough.    Calcium-Phosphorus-Vitamin D (CITRACAL +D3 PO) Take 1 tablet by mouth daily.    Chlorpheniramine Maleate (ALLERGY PO) Take 1 tablet by mouth daily.    clopidogrel (PLAVIX) 75 MG tablet Take 75 mg by mouth daily.    Cyanocobalamin (B-12 PO) Take 1 tablet by mouth daily. Triple B-12    Fluticasone-Salmeterol (ADVAIR) 250-50 MCG/DOSE AEPB Inhale 1 puff into the lungs 2 (two) times daily.    guaiFENesin-dextromethorphan (ROBITUSSIN DM) 100-10 MG/5ML syrup Take 5 mLs by mouth every 4 (four) hours as needed for cough. Qty: 118 mL, Refills: 0    hydrochlorothiazide (HYDRODIURIL) 25 MG tablet Take 25 mg by mouth daily.     mesalamine (PENTASA) 500 MG CR capsule Take 1,000 mg by mouth 2 (two) times daily.    metoprolol succinate (TOPROL-XL) 25 MG 24 hr tablet Take 25 mg by mouth daily.    Multiple Vitamin (MULTIVITAMIN WITH MINERALS) TABS tablet Take 1 tablet by mouth daily.    nitroGLYCERIN (NITROSTAT) 0.4 MG SL tablet Place 0.4 mg under the tongue every 5 (five) minutes as needed for chest pain.    potassium chloride (K-DUR,KLOR-CON) 10 MEQ tablet Take 10 mEq by mouth daily.     risedronate (ACTONEL) 150 MG tablet Take 150 mg by mouth every 30 (thirty) days. with water on empty stomach, nothing by mouth or lie down for next 30 minutes.    senna (SENOKOT) 8.6 MG tablet Take 2 tablets by mouth at bedtime.     thiamine (VITAMIN B-1) 100 MG tablet Take 100 mg by mouth daily.    vitamin E 400 UNIT capsule Take 400 Units by mouth daily.      STOP taking these medications     cefUROXime (CEFTIN) 250 MG tablet      predniSONE (DELTASONE) 1 MG  tablet           Today   CHIEF COMPLAINT:   doing better this am Coughing up some streaky blood but has been coughing a lot but SOB better   VITAL SIGNS:  Blood pressure (!) 125/56, pulse (!) 101, temperature 98.2 F (36.8 C), temperature source Oral, resp. rate (!) 22, height 5' 6"  (1.676 m), weight 80.1 kg (176 lb 8 oz), SpO2 90 %.   REVIEW OF SYSTEMS:  Review of Systems  Constitutional: Negative.  Negative for chills, fever and malaise/fatigue.  HENT: Negative.  Negative for ear discharge, ear pain, hearing loss, nosebleeds and sore throat.   Eyes: Negative.  Negative for blurred vision and pain.  Respiratory: Positive for cough. Negative for hemoptysis, shortness of breath and wheezing.   Cardiovascular: Negative.  Negative for chest pain, palpitations and leg swelling.  Gastrointestinal: Negative.  Negative for abdominal pain, blood in stool, diarrhea, nausea and vomiting.  Genitourinary: Negative.  Negative for dysuria.  Musculoskeletal: Negative.  Negative for back pain.  Skin: Negative.   Neurological: Negative for dizziness, tremors, speech change, focal weakness, seizures and headaches.  Endo/Heme/Allergies: Negative.  Does not bruise/bleed easily.  Psychiatric/Behavioral: Negative.  Negative for depression, hallucinations and suicidal ideas.     PHYSICAL EXAMINATION:  GENERAL:  82 y.o.-year-old patient lying in the bed with no acute distress.  NECK:  Supple, no jugular venous distention. No thyroid enlargement, no tenderness.  LUNGS: Normal breath sounds bilaterally, no wheezing, rales,rhonchi +crackles right base No use of accessory muscles of respiration.  CARDIOVASCULAR: S1, S2 normal. No murmurs, rubs, or gallops.  ABDOMEN: Soft, non-tender, non-distended. Bowel sounds present. No organomegaly or mass.  EXTREMITIES: No pedal edema, cyanosis, or clubbing.  PSYCHIATRIC: The patient is alert and oriented x 3.  SKIN: No obvious rash, lesion, or ulcer.   DATA  REVIEW:   CBC  Recent Labs Lab 04/23/16 0816  WBC 13.2*  HGB 9.3*  HCT 28.3*  PLT 277    Chemistries   Recent Labs Lab 04/17/16 1601  04/22/16 0535  04/24/16 0417  NA 135  < > 136  < > 137  K 3.5  < > 3.3*  < > 4.4  CL 100*  < > 101  < > 103  CO2 26  < > 27  < > 27  GLUCOSE 88  < > 94  < > 201*  BUN 18  < > 18  < > 23*  CREATININE 1.05*  < > 1.02*  < > 0.96  CALCIUM 8.5*  < > 8.5*  < > 8.1*  MG  --   --  1.7  --   --   AST 33  --   --   --   --   ALT 28  --   --   --   --   ALKPHOS 69  --   --   --   --   BILITOT 0.5  --   --   --   --   < > = values in this interval not displayed.  Cardiac Enzymes  Recent Labs Lab 04/17/16 1601 04/22/16 0535  TROPONINI 0.03* 0.03*    Microbiology Results  @MICRORSLT48 @  RADIOLOGY:  X-ray Chest Pa And Lateral  Result Date: 04/23/2016 CLINICAL DATA:  Shortness of breath . EXAM: CHEST  2 VIEW COMPARISON:  04/22/2016. FINDINGS: Mediastinum and hilar structures normal. Low lung volumes with mild basilar atelectasis and infiltrates. Small right pleural effusion. Heart size normal . IMPRESSION: 1. Low lung volumes with mild basilar atelectasis and infiltrates. Similar findings noted on prior exam. 2. Small right pleural effusion . Electronically Signed   By: Marcello Moores  Register   On: 04/23/2016 07:50      Current Discharge Medication List    START taking these medications   Details  predniSONE (STERAPRED UNI-PAK 21 TAB) 10 MG (21) TBPK tablet Take 1 tablet (10 mg total) by mouth daily. As directed on box then resume your home dose of prednisone Qty: 21 tablet, Refills: 0      CONTINUE these medications which have CHANGED   Details  levofloxacin (LEVAQUIN) 750 MG tablet Take 1 tablet (750 mg total) by mouth daily. Qty:  5 tablet, Refills: 0      CONTINUE these medications which have NOT CHANGED   Details  acetaminophen (TYLENOL) 500 MG tablet Take 1,000 mg by mouth See admin instructions. Takes 2 tablets at night and during  the day only if needed for pain    Adalimumab (HUMIRA) 40 MG/0.8ML PSKT Inject 40 mg into the skin every 14 (fourteen) days.     albuterol (PROVENTIL HFA;VENTOLIN HFA) 108 (90 BASE) MCG/ACT inhaler Inhale 2 puffs into the lungs every 6 (six) hours as needed for wheezing or shortness of breath.    amitriptyline (ELAVIL) 10 MG tablet Take 20 mg by mouth at bedtime.    amLODipine-benazepril (LOTREL) 5-20 MG per capsule Take 1 capsule by mouth daily.     aspirin EC 81 MG tablet Take 81 mg by mouth daily.    atorvastatin (LIPITOR) 80 MG tablet Take 80 mg by mouth at bedtime.     benzonatate (TESSALON) 100 MG capsule Take by mouth 3 (three) times daily as needed for cough.    Calcium-Phosphorus-Vitamin D (CITRACAL +D3 PO) Take 1 tablet by mouth daily.    Chlorpheniramine Maleate (ALLERGY PO) Take 1 tablet by mouth daily.    clopidogrel (PLAVIX) 75 MG tablet Take 75 mg by mouth daily.    Cyanocobalamin (B-12 PO) Take 1 tablet by mouth daily. Triple B-12    Fluticasone-Salmeterol (ADVAIR) 250-50 MCG/DOSE AEPB Inhale 1 puff into the lungs 2 (two) times daily.    guaiFENesin-dextromethorphan (ROBITUSSIN DM) 100-10 MG/5ML syrup Take 5 mLs by mouth every 4 (four) hours as needed for cough. Qty: 118 mL, Refills: 0    hydrochlorothiazide (HYDRODIURIL) 25 MG tablet Take 25 mg by mouth daily.     mesalamine (PENTASA) 500 MG CR capsule Take 1,000 mg by mouth 2 (two) times daily.    metoprolol succinate (TOPROL-XL) 25 MG 24 hr tablet Take 25 mg by mouth daily.    Multiple Vitamin (MULTIVITAMIN WITH MINERALS) TABS tablet Take 1 tablet by mouth daily.    nitroGLYCERIN (NITROSTAT) 0.4 MG SL tablet Place 0.4 mg under the tongue every 5 (five) minutes as needed for chest pain.    potassium chloride (K-DUR,KLOR-CON) 10 MEQ tablet Take 10 mEq by mouth daily.     risedronate (ACTONEL) 150 MG tablet Take 150 mg by mouth every 30 (thirty) days. with water on empty stomach, nothing by mouth or lie down  for next 30 minutes.    senna (SENOKOT) 8.6 MG tablet Take 2 tablets by mouth at bedtime.     thiamine (VITAMIN B-1) 100 MG tablet Take 100 mg by mouth daily.    vitamin E 400 UNIT capsule Take 400 Units by mouth daily.      STOP taking these medications     cefUROXime (CEFTIN) 250 MG tablet      predniSONE (DELTASONE) 1 MG tablet            Management plans discussed with the patient and she is in agreement. Stable for discharge home with [facility]  Patient should follow up with pcp and dr Raul Del  CODE STATUS:     Code Status Orders        Start     Ordered   04/22/16 0946  Full code  Continuous     04/22/16 0945    Code Status History    Date Active Date Inactive Code Status Order ID Comments User Context   04/22/2016  9:32 AM 04/22/2016  9:45 AM DNR [id number]  Bettey Costa,  MD Inpatient   04/17/2016  6:53 PM 04/20/2016  1:52 PM Full Code [id number]  Vaughan Basta, MD Inpatient   04/19/2015  8:24 PM 04/21/2015  5:14 PM Full Code [id number]  Dustin Flock, MD Inpatient   11/02/2014 12:02 PM 11/08/2014  5:41 PM Full Code [id number]  Bettey Costa, MD Inpatient    Advance Directive Documentation   Flowsheet Row Most Recent Value  Type of Advance Directive  Living will, Healthcare Power of Attorney  Pre-existing out of facility DNR order (yellow form or pink MOST form)  No data  "MOST" Form in Place?  No data      TOTAL TIME TAKING CARE OF THIS PATIENT: 37 minutes.    Note: This dictation was prepared with Dragon dictation along with smaller phrase technology. Any transcriptional errors that result from this process are unintentional.  Aiden Rao M.D on 04/24/2016 at 11:41 AM  Between 7am to 6pm - Pager - [phone] After 6pm go to [email or web address] - password [email or web address] [facility]  Office  [phone]  CC: Primary care physician; [name], MD

## 2016-04-24 NOTE — Care Management Important Message (Signed)
Important Message  Patient Details  Name: Raven [name] MRN: [id number] Date of Birth: 1935/02/10   Medicare Important Message Given:  Yes Copy of initial IM given   Jolly Mango, RN 04/24/2016, 12:10 PM

## 2016-04-24 NOTE — Progress Notes (Signed)
Inpatient Diabetes Program Recommendations  AACE/ADA: New Consensus Statement on Inpatient Glycemic Control (2015)  Target Ranges:  Prepandial:   less than 140 mg/dL      Peak postprandial:   less than 180 mg/dL (1-2 hours)      Critically ill patients:  140 - 180 mg/dL  Results for ECE, [place] (MRN [id number]) as of 04/24/2016 10:04  Ref. Range 04/22/2016 05:35 04/23/2016 08:16 04/24/2016 04:17  Glucose Latest Ref Range: 65 - 99 mg/dL 94 203 (H) 201 (H)   Review of Glycemic Control  Diabetes history: No Outpatient Diabetes medications: NA Current orders for Inpatient glycemic control: None  Inpatient Diabetes Program Recommendations: Correction (SSI): While inpatient and ordered steorids, please consider ordering CBGs with Novolog correction scale ACHS. HgbA1C: Please consider ordering an A1C to evaluate glycemic control over the past 2-3 months.  NOTE: Ordered Solumedrol 40 mg Q12H which is likely cause of hyperglycemia.   Thanks, Barnie Alderman, RN, MSN, CDE Diabetes Coordinator Inpatient Diabetes Program [phone] (Team Pager from 8am to 5pm)

## 2018-01-31 ENCOUNTER — Emergency Department: Payer: Medicare Other

## 2018-01-31 ENCOUNTER — Other Ambulatory Visit: Payer: Self-pay

## 2018-01-31 ENCOUNTER — Emergency Department
Admission: EM | Admit: 2018-01-31 | Discharge: 2018-01-31 | Disposition: A | Discharge status: Home / Self Care | Payer: Medicare Other | Attending: Emergency Medicine | Admitting: Emergency Medicine

## 2018-01-31 ENCOUNTER — Encounter: Payer: Self-pay | Admitting: Emergency Medicine

## 2018-01-31 DIAGNOSIS — I10 Essential (primary) hypertension: Secondary | ICD-10-CM | POA: Insufficient documentation

## 2018-01-31 DIAGNOSIS — Z87891 Personal history of nicotine dependence: Secondary | ICD-10-CM | POA: Diagnosis not present

## 2018-01-31 DIAGNOSIS — Z79899 Other long term (current) drug therapy: Secondary | ICD-10-CM | POA: Diagnosis not present

## 2018-01-31 DIAGNOSIS — Y9389 Activity, other specified: Secondary | ICD-10-CM | POA: Insufficient documentation

## 2018-01-31 DIAGNOSIS — Z7982 Long term (current) use of aspirin: Secondary | ICD-10-CM | POA: Diagnosis not present

## 2018-01-31 DIAGNOSIS — S6991XA Unspecified injury of right wrist, hand and finger(s), initial encounter: Secondary | ICD-10-CM | POA: Diagnosis present

## 2018-01-31 DIAGNOSIS — Y92007 Garden or yard of unspecified non-institutional (private) residence as the place of occurrence of the external cause: Secondary | ICD-10-CM | POA: Insufficient documentation

## 2018-01-31 DIAGNOSIS — Y999 Unspecified external cause status: Secondary | ICD-10-CM | POA: Insufficient documentation

## 2018-01-31 DIAGNOSIS — W01198A Fall on same level from slipping, tripping and stumbling with subsequent striking against other object, initial encounter: Secondary | ICD-10-CM | POA: Insufficient documentation

## 2018-01-31 DIAGNOSIS — Z7902 Long term (current) use of antithrombotics/antiplatelets: Secondary | ICD-10-CM | POA: Insufficient documentation

## 2018-01-31 DIAGNOSIS — J449 Chronic obstructive pulmonary disease, unspecified: Secondary | ICD-10-CM | POA: Diagnosis not present

## 2018-01-31 DIAGNOSIS — S60221A Contusion of right hand, initial encounter: Secondary | ICD-10-CM | POA: Diagnosis not present

## 2018-01-31 NOTE — ED Triage Notes (Signed)
[place] today in the yard.  C/o right hand pain.

## 2018-01-31 NOTE — ED Provider Notes (Signed)
[place] EMERGENCY DEPARTMENT Provider Note   CSN: [id number] Arrival date & time: 01/31/18  1736     History   Chief Complaint Chief Complaint  Patient presents with  . Hand Injury    HPI Raven [name] is a 83 y.o. female.  Presents to the emergency department for evaluation of right hand pain.  Just prior to arrival patient fell in the yard today and landed on her right hand.  She denies any other injury to her body except for pain and swelling along the fourth and fifth MCP joints.  She is on aspirin and Plavix.  She denies any head injury, headache, neck pain.  No wrist pain.  She is able to make a fist with the right hand.  Pain is currently mild but can be moderate to touch on the fourth and fifth MCP joint.  HPI  Past Medical History:  Diagnosis Date  . Anemia   . Arthritis   . Asthma   . Complication of anesthesia   . COPD (chronic obstructive pulmonary disease) ([place])   . Crohn's disease ([place])   . Depression    after death of husband  . Hyperlipidemia   . Hypertension   . Myocardial infarction ([place])   . Osteopenia   . Peptic ulcer disease   . Pneumonia   . PONV (postoperative nausea and vomiting)     Patient Active Problem List   Diagnosis Date Noted  . COPD (chronic obstructive pulmonary disease) ([place]) 04/22/2016  . Acute bronchitis 04/17/2016  . Sepsis ([place]) 04/17/2016  . Peroneal tendonitis 07/18/2015  . Pneumonia 04/19/2015  . CAP (community acquired pneumonia) 11/02/2014    Past Surgical History:  Procedure Laterality Date  . ABDOMINAL SURGERY    . CARDIAC CATHETERIZATION     with stent  . COLONOSCOPY WITH PROPOFOL N/A 09/27/2014   Procedure: COLONOSCOPY WITH PROPOFOL;  Surgeon: [name], MD;  Location: [facility] ENDOSCOPY;  Service: Gastroenterology;  Laterality: N/A;  . EYE SURGERY     bilateral cataract surgeries  . SHOULDER SURGERY Right    x 2   . SMALL INTESTINE SURGERY       OB History   None      Home  Medications    Prior to Admission medications   Medication Sig Start Date End Date Taking? Authorizing Provider  acetaminophen (TYLENOL) 500 MG tablet Take 1,000 mg by mouth See admin instructions. Takes 2 tablets at night and during the day only if needed for pain    [provider]  Adalimumab (HUMIRA) 40 MG/0.8ML PSKT Inject 40 mg into the skin every 14 (fourteen) days.     [provider]  albuterol (PROVENTIL HFA;VENTOLIN HFA) 108 (90 BASE) MCG/ACT inhaler Inhale 2 puffs into the lungs every 6 (six) hours as needed for wheezing or shortness of breath.    [provider]  amitriptyline (ELAVIL) 10 MG tablet Take 20 mg by mouth at bedtime.    [provider]  amLODipine-benazepril (LOTREL) 5-20 MG per capsule Take 1 capsule by mouth daily.     [provider]  aspirin EC 81 MG tablet Take 81 mg by mouth daily.    [provider]  atorvastatin (LIPITOR) 80 MG tablet Take 80 mg by mouth at bedtime.     [provider]  benzonatate (TESSALON) 100 MG capsule Take by mouth 3 (three) times daily as needed for cough.    [provider]  Calcium-Phosphorus-Vitamin D (CITRACAL +D3  PO) Take 1 tablet by mouth daily.    [provider]  Chlorpheniramine Maleate (ALLERGY PO) Take 1 tablet by mouth daily.    [provider]  clopidogrel (PLAVIX) 75 MG tablet Take 75 mg by mouth daily.    [provider]  Cyanocobalamin (B-12 PO) Take 1 tablet by mouth daily. Triple B-12    [provider]  Fluticasone-Salmeterol (ADVAIR) 250-50 MCG/DOSE AEPB Inhale 1 puff into the lungs 2 (two) times daily.    [provider]  guaiFENesin-dextromethorphan (ROBITUSSIN DM) 100-10 MG/5ML syrup Take 5 mLs by mouth every 4 (four) hours as needed for cough. 04/20/16   Max Sane, MD  hydrochlorothiazide (HYDRODIURIL) 25 MG tablet Take 25 mg by mouth daily.     [provider]  levofloxacin (LEVAQUIN) 750 MG  tablet Take 1 tablet (750 mg total) by mouth daily. 04/24/16   Bettey Costa, MD  mesalamine (PENTASA) 500 MG CR capsule Take 1,000 mg by mouth 2 (two) times daily.    [provider]  metoprolol succinate (TOPROL-XL) 25 MG 24 hr tablet Take 25 mg by mouth daily.    [provider]  Multiple Vitamin (MULTIVITAMIN WITH MINERALS) TABS tablet Take 1 tablet by mouth daily.    [provider]  nitroGLYCERIN (NITROSTAT) 0.4 MG SL tablet Place 0.4 mg under the tongue every 5 (five) minutes as needed for chest pain.    [provider]  potassium chloride (K-DUR,KLOR-CON) 10 MEQ tablet Take 10 mEq by mouth daily.     [provider]  predniSONE (STERAPRED UNI-PAK 21 TAB) 10 MG (21) TBPK tablet Take 1 tablet (10 mg total) by mouth daily. As directed on box then resume your home dose of prednisone 04/24/16   Bettey Costa, MD  risedronate (ACTONEL) 150 MG tablet Take 150 mg by mouth every 30 (thirty) days. with water on empty stomach, nothing by mouth or lie down for next 30 minutes.    [provider]  senna (SENOKOT) 8.6 MG tablet Take 2 tablets by mouth at bedtime.     [provider]  thiamine (VITAMIN B-1) 100 MG tablet Take 100 mg by mouth daily.    [provider]  vitamin E 400 UNIT capsule Take 400 Units by mouth daily.    [provider]    Family History Family History  Problem Relation Age of Onset  . Hypertension Unknown     Social History Social History   Tobacco Use  . Smoking status: Former Smoker    Last attempt to quit: 1974    Years since quitting: 45.9  . Smokeless tobacco: Never Used  Substance Use Topics  . Alcohol use: Yes    Comment: occassional  . Drug use: No     Allergies   Penicillins; Augmentin [amoxicillin-pot clavulanate]; Indocin [indomethacin]; Lodine [etodolac]; Methotrexate derivatives; Gold; and Zantac [ranitidine hcl]   Review of Systems Review of Systems  Constitutional:  Negative for activity change.  Eyes: Negative for pain and visual disturbance.  Respiratory: Negative for shortness of breath.   Cardiovascular: Negative for chest pain and leg swelling.  Gastrointestinal: Negative for abdominal pain.  Genitourinary: Negative for flank pain and pelvic pain.  Musculoskeletal: Positive for arthralgias and joint swelling. Negative for gait problem, myalgias, neck pain and neck stiffness.  Skin: Negative for wound.  Neurological: Negative for dizziness, syncope, weakness, light-headedness, numbness and headaches.  Psychiatric/Behavioral: Negative for confusion and decreased concentration.     Physical Exam Updated Vital Signs BP [name])  152/79 (BP Location: Left Arm)   Pulse 89   Temp 98.1 F (36.7 C) (Oral)   Resp 18   Wt 80.1 kg   SpO2 95%   BMI 28.50 kg/m   Physical Exam  Constitutional: She is oriented to person, place, and time. She appears well-developed and well-nourished.  HENT:  Head: Normocephalic and atraumatic.  Eyes: Conjunctivae are normal.  Neck: Normal range of motion.  Cardiovascular: Normal rate.  Pulmonary/Chest: Effort normal. No respiratory distress.  Musculoskeletal:  Patient has full composite fist of right upper extremity.  There is some swelling and ecchymosis along the fourth and fifth MCP joint.  Swelling and ecchymosis is mild.  She has full active extension and flexion of the digits.  Sensation is intact distally.  No signs of compartment syndrome.  Neurological: She is alert and oriented to person, place, and time.  Skin: Skin is warm. No rash noted.  Psychiatric: She has a normal mood and affect. Her behavior is normal. Thought content normal.     ED Treatments / Results  Labs (all labs ordered are listed, but only abnormal results are displayed) Labs Reviewed - No data to display  EKG None  Radiology Dg Hand Complete Right  Result Date: 01/31/2018 CLINICAL DATA:  Pain post trauma. EXAM: RIGHT HAND -  COMPLETE 3+ VIEW COMPARISON:  None. FINDINGS: There is no evidence of fracture or dislocation. First carpometacarpal joint osteoarthritic changes. Focal soft tissue swelling adjacent to the fifth metacarpophalangeal joint. IMPRESSION: No acute fracture or dislocation identified about the right hand. Focal soft tissue swelling adjacent to the fifth metacarpophalangeal joint. Electronically Signed   By: Fidela Salisbury M.D.   On: 01/31/2018 18:38    Procedures Procedures (including critical care time)  Medications Ordered in ED Medications - No data to display   Initial Impression / Assessment and Plan / ED Course  I have reviewed the triage vital signs and the nursing notes.  Pertinent labs & imaging results that were available during my care of the patient were reviewed by me and considered in my medical decision making (see chart for details).     83 year old female with fall to the right hand earlier today.  No other injury to her body.  She complains of mild pain mostly swelling and ecchymosis to the fourth and fifth MCP joint of the right hand.  X-rays right hand reviewed by me today show no evidence of acute bony normality.  Ace wrap was applied today in the office as well as an ice pack.  She will rest ice and elevate.  She understands signs symptoms return to the ED for.  Final Clinical Impressions(s) / ED Diagnoses   Final diagnoses:  Traumatic hematoma of right hand, initial encounter    ED Discharge Orders    None       Renata Caprice 01/31/18 1849    Orbie Pyo, MD 01/31/18 [phone]

## 2018-01-31 NOTE — Discharge Instructions (Addendum)
Please continue with Ace wrap for 24 to 48 hours as needed.  Try to apply ice to the hand 20 minutes every hour over the next couple days.  If any increasing pain swelling numbness or tingling return to the emergency department.  Take Tylenol as needed for pain.

## 2018-04-23 ENCOUNTER — Other Ambulatory Visit
Admission: RE | Admit: 2018-04-23 | Discharge: 2018-04-23 | Disposition: A | Discharge status: Home / Self Care | Payer: Medicare Other | Source: Ambulatory Visit | Attending: Gastroenterology | Admitting: Gastroenterology

## 2018-04-23 DIAGNOSIS — K50919 Crohn's disease, unspecified, with unspecified complications: Secondary | ICD-10-CM | POA: Insufficient documentation

## 2018-04-23 LAB — C DIFFICILE QUICK SCREEN W PCR REFLEX
C Diff antigen: NEGATIVE
C Diff interpretation: NOT DETECTED
C Diff toxin: NEGATIVE

## 2018-05-18 ENCOUNTER — Other Ambulatory Visit
Admission: RE | Admit: 2018-05-18 | Discharge: 2018-05-18 | Disposition: A | Discharge status: Home / Self Care | Payer: Medicare Other | Source: Ambulatory Visit | Attending: Gastroenterology | Admitting: Gastroenterology

## 2018-05-18 DIAGNOSIS — R197 Diarrhea, unspecified: Secondary | ICD-10-CM | POA: Diagnosis present

## 2018-05-18 LAB — GASTROINTESTINAL PANEL BY PCR, STOOL (REPLACES STOOL CULTURE)

## 2018-10-20 ENCOUNTER — Other Ambulatory Visit: Payer: Self-pay

## 2018-10-20 ENCOUNTER — Inpatient Hospital Stay
Admission: EM | Admit: 2018-10-20 | Discharge: 2018-10-23 | DRG: 189 | Disposition: A | Discharge status: Home / Self Care | Payer: Medicare Other | Attending: Internal Medicine | Admitting: Internal Medicine

## 2018-10-20 ENCOUNTER — Emergency Department: Payer: Medicare Other

## 2018-10-20 DIAGNOSIS — I252 Old myocardial infarction: Secondary | ICD-10-CM

## 2018-10-20 DIAGNOSIS — Z7902 Long term (current) use of antithrombotics/antiplatelets: Secondary | ICD-10-CM

## 2018-10-20 DIAGNOSIS — M1990 Unspecified osteoarthritis, unspecified site: Secondary | ICD-10-CM | POA: Diagnosis present

## 2018-10-20 DIAGNOSIS — Z8249 Family history of ischemic heart disease and other diseases of the circulatory system: Secondary | ICD-10-CM

## 2018-10-20 DIAGNOSIS — Z955 Presence of coronary angioplasty implant and graft: Secondary | ICD-10-CM

## 2018-10-20 DIAGNOSIS — Z9981 Dependence on supplemental oxygen: Secondary | ICD-10-CM | POA: Diagnosis not present

## 2018-10-20 DIAGNOSIS — I110 Hypertensive heart disease with heart failure: Secondary | ICD-10-CM | POA: Diagnosis present

## 2018-10-20 DIAGNOSIS — Z66 Do not resuscitate: Secondary | ICD-10-CM | POA: Diagnosis present

## 2018-10-20 DIAGNOSIS — F329 Major depressive disorder, single episode, unspecified: Secondary | ICD-10-CM | POA: Diagnosis present

## 2018-10-20 DIAGNOSIS — I5043 Acute on chronic combined systolic (congestive) and diastolic (congestive) heart failure: Secondary | ICD-10-CM | POA: Diagnosis present

## 2018-10-20 DIAGNOSIS — Z91048 Other nonmedicinal substance allergy status: Secondary | ICD-10-CM

## 2018-10-20 DIAGNOSIS — Z7982 Long term (current) use of aspirin: Secondary | ICD-10-CM

## 2018-10-20 DIAGNOSIS — Z20828 Contact with and (suspected) exposure to other viral communicable diseases: Secondary | ICD-10-CM | POA: Diagnosis present

## 2018-10-20 DIAGNOSIS — E785 Hyperlipidemia, unspecified: Secondary | ICD-10-CM | POA: Diagnosis present

## 2018-10-20 DIAGNOSIS — I340 Nonrheumatic mitral (valve) insufficiency: Secondary | ICD-10-CM | POA: Diagnosis not present

## 2018-10-20 DIAGNOSIS — A419 Sepsis, unspecified organism: Secondary | ICD-10-CM | POA: Diagnosis present

## 2018-10-20 DIAGNOSIS — Z880 Allergy status to penicillin: Secondary | ICD-10-CM

## 2018-10-20 DIAGNOSIS — J9621 Acute and chronic respiratory failure with hypoxia: Principal | ICD-10-CM | POA: Diagnosis present

## 2018-10-20 DIAGNOSIS — K5090 Crohn's disease, unspecified, without complications: Secondary | ICD-10-CM | POA: Diagnosis present

## 2018-10-20 DIAGNOSIS — Z79899 Other long term (current) drug therapy: Secondary | ICD-10-CM

## 2018-10-20 DIAGNOSIS — Z87891 Personal history of nicotine dependence: Secondary | ICD-10-CM | POA: Diagnosis not present

## 2018-10-20 DIAGNOSIS — R509 Fever, unspecified: Secondary | ICD-10-CM

## 2018-10-20 DIAGNOSIS — J441 Chronic obstructive pulmonary disease with (acute) exacerbation: Secondary | ICD-10-CM | POA: Diagnosis present

## 2018-10-20 DIAGNOSIS — I361 Nonrheumatic tricuspid (valve) insufficiency: Secondary | ICD-10-CM | POA: Diagnosis not present

## 2018-10-20 LAB — COMPREHENSIVE METABOLIC PANEL
ALT: 24 U/L (ref 0–44)
AST: 20 U/L (ref 15–41)
Albumin: 3.8 g/dL (ref 3.5–5.0)
Alkaline Phosphatase: 66 U/L (ref 38–126)
Anion gap: 9 (ref 5–15)
BUN: 14 mg/dL (ref 8–23)
CO2: 24 mmol/L (ref 22–32)
Calcium: 8.2 mg/dL — ABNORMAL LOW (ref 8.9–10.3)
Chloride: 107 mmol/L (ref 98–111)
Creatinine, Ser: 0.77 mg/dL (ref 0.44–1.00)
GFR calc Af Amer: >60 mL/min (ref >60)
GFR calc non Af Amer: >60 mL/min (ref >60)
Glucose, Bld: 99 mg/dL (ref 70–99)
Potassium: 3.6 mmol/L (ref 3.5–5.1)
Sodium: 140 mmol/L (ref 135–145)
Total Bilirubin: 0.7 mg/dL (ref 0.3–1.2)
Total Protein: 6.6 g/dL (ref 6.5–8.1)

## 2018-10-20 LAB — BRAIN NATRIURETIC PEPTIDE: B Natriuretic Peptide: 659 pg/mL — ABNORMAL HIGH (ref 0.0–100.0)

## 2018-10-20 LAB — CBC WITH DIFFERENTIAL/PLATELET
Abs Immature Granulocytes: 0.07 10*3/uL (ref 0.00–0.07)
Basophils Absolute: 0.1 10*3/uL (ref 0.0–0.1)
Basophils Relative: 1 %
Eosinophils Absolute: 0.1 10*3/uL (ref 0.0–0.5)
Eosinophils Relative: 2 %
HCT: 39.4 % (ref 36.0–46.0)
Hemoglobin: 12.3 g/dL (ref 12.0–15.0)
Immature Granulocytes: 1 %
Lymphocytes Relative: 21 %
Lymphs Abs: 1.6 10*3/uL (ref 0.7–4.0)
MCH: 30.7 pg (ref 26.0–34.0)
MCHC: 31.2 g/dL (ref 30.0–36.0)
MCV: 98.3 fL (ref 80.0–100.0)
Monocytes Absolute: 1 10*3/uL (ref 0.1–1.0)
Monocytes Relative: 13 %
Neutro Abs: 4.8 10*3/uL (ref 1.7–7.7)
Neutrophils Relative %: 62 %
Platelets: 175 10*3/uL (ref 150–400)
RBC: 4.01 MIL/uL (ref 3.87–5.11)
RDW: 14.2 % (ref 11.5–15.5)
WBC: 7.6 10*3/uL (ref 4.0–10.5)
nRBC: 0 % (ref 0.0–0.2)

## 2018-10-20 LAB — PROTIME-INR
INR: 1 (ref 0.8–1.2)
Prothrombin Time: 12.7 seconds (ref 11.4–15.2)

## 2018-10-20 LAB — URINALYSIS, ROUTINE W REFLEX MICROSCOPIC
Bacteria, UA: NONE SEEN
Bilirubin Urine: NEGATIVE
Glucose, UA: NEGATIVE mg/dL
Ketones, ur: 5 mg/dL — AB
Leukocytes,Ua: NEGATIVE
Nitrite: NEGATIVE
Protein, ur: 30 mg/dL — AB
Specific Gravity, Urine: 1.016 (ref 1.005–1.030)
Squamous Epithelial / HPF: NONE SEEN (ref 0–5)
pH: 5 (ref 5.0–8.0)

## 2018-10-20 LAB — SARS CORONAVIRUS 2 BY RT PCR (HOSPITAL ORDER, PERFORMED IN ~~LOC~~ HOSPITAL LAB): SARS Coronavirus 2: NEGATIVE

## 2018-10-20 LAB — LACTIC ACID, PLASMA
Lactic Acid, Venous: 1.4 mmol/L (ref 0.5–1.9)
Lactic Acid, Venous: 1.2 mmol/L (ref 0.5–1.9)

## 2018-10-20 LAB — APTT: aPTT: 28 seconds (ref 24–36)

## 2018-10-20 MED ORDER — SENNA 8.6 MG PO TABS
2.0000 | ORAL_TABLET | Freq: Every day | ORAL | Status: DC
  Filled 2018-10-20: qty 2

## 2018-10-20 MED ORDER — SODIUM CHLORIDE 0.9 % IV BOLUS
1000.0000 mL | Freq: Once | INTRAVENOUS | Status: AC
  Administered 2018-10-20: 1000 mL via INTRAVENOUS

## 2018-10-20 MED ORDER — ACETAMINOPHEN 500 MG PO TABS
ORAL_TABLET | ORAL | Status: AC
  Administered 2018-10-20: 12:00:00 1000 mg via ORAL
  Filled 2018-10-20: qty 2

## 2018-10-20 MED ORDER — ONDANSETRON HCL 4 MG PO TABS: 4.0000 mg | ORAL_TABLET | Freq: Four times a day (QID) | ORAL | Status: DC | PRN

## 2018-10-20 MED ORDER — FUROSEMIDE 10 MG/ML IJ SOLN
60.0000 mg | Freq: Once | INTRAMUSCULAR | Status: AC
  Administered 2018-10-20: 60 mg via INTRAVENOUS
  Filled 2018-10-20: qty 8

## 2018-10-20 MED ORDER — MESALAMINE ER 250 MG PO CPCR
1000.0000 mg | ORAL_CAPSULE | Freq: Two times a day (BID) | ORAL | Status: DC
  Administered 2018-10-20 – 2018-10-22 (×4): 1000 mg via ORAL
  Filled 2018-10-20 (×7): qty 4

## 2018-10-20 MED ORDER — SODIUM CHLORIDE 0.9 % IV SOLN
500.0000 mg | INTRAVENOUS | Status: DC
  Administered 2018-10-21: 500 mg via INTRAVENOUS
  Filled 2018-10-20: qty 500

## 2018-10-20 MED ORDER — AMLODIPINE BESY-BENAZEPRIL HCL 5-20 MG PO CAPS: 1.0000 | ORAL_CAPSULE | Freq: Every day | ORAL | Status: DC

## 2018-10-20 MED ORDER — ACETAMINOPHEN 500 MG PO TABS
1000.0000 mg | ORAL_TABLET | Freq: Once | ORAL | Status: AC
  Administered 2018-10-20: 1000 mg via ORAL

## 2018-10-20 MED ORDER — AMLODIPINE BESYLATE 5 MG PO TABS
5.0000 mg | ORAL_TABLET | Freq: Every day | ORAL | Status: DC
  Administered 2018-10-20 – 2018-10-23 (×4): 5 mg via ORAL
  Filled 2018-10-20 (×4): qty 1

## 2018-10-20 MED ORDER — POTASSIUM CHLORIDE CRYS ER 10 MEQ PO TBCR
10.0000 meq | EXTENDED_RELEASE_TABLET | Freq: Every day | ORAL | Status: DC
  Administered 2018-10-20 – 2018-10-23 (×4): 10 meq via ORAL
  Filled 2018-10-20 (×4): qty 1

## 2018-10-20 MED ORDER — BENAZEPRIL HCL 20 MG PO TABS
20.0000 mg | ORAL_TABLET | Freq: Every day | ORAL | Status: DC
  Administered 2018-10-21 – 2018-10-23 (×3): 20 mg via ORAL
  Filled 2018-10-20 (×5): qty 1

## 2018-10-20 MED ORDER — SODIUM CHLORIDE 0.9 % IV SOLN: 1.0000 g | INTRAVENOUS | Status: DC

## 2018-10-20 MED ORDER — SODIUM CHLORIDE 0.9 % IV SOLN
500.0000 mg | Freq: Once | INTRAVENOUS | Status: AC
  Administered 2018-10-20: 500 mg via INTRAVENOUS
  Filled 2018-10-20: qty 500

## 2018-10-20 MED ORDER — ASPIRIN EC 81 MG PO TBEC
81.0000 mg | DELAYED_RELEASE_TABLET | Freq: Every day | ORAL | Status: DC
  Administered 2018-10-20 – 2018-10-22 (×3): 81 mg via ORAL
  Filled 2018-10-20 (×4): qty 1

## 2018-10-20 MED ORDER — CLOPIDOGREL BISULFATE 75 MG PO TABS
75.0000 mg | ORAL_TABLET | Freq: Every day | ORAL | Status: DC
  Administered 2018-10-20 – 2018-10-23 (×4): 75 mg via ORAL
  Filled 2018-10-20 (×4): qty 1

## 2018-10-20 MED ORDER — METOPROLOL SUCCINATE ER 25 MG PO TB24
25.0000 mg | ORAL_TABLET | Freq: Every day | ORAL | Status: DC
  Administered 2018-10-20 – 2018-10-22 (×3): 25 mg via ORAL
  Filled 2018-10-20 (×3): qty 1

## 2018-10-20 MED ORDER — ONDANSETRON HCL 4 MG/2ML IJ SOLN
4.0000 mg | Freq: Four times a day (QID) | INTRAMUSCULAR | Status: DC | PRN
  Administered 2018-10-22: 4 mg via INTRAVENOUS
  Filled 2018-10-20: qty 2

## 2018-10-20 MED ORDER — MOMETASONE FURO-FORMOTEROL FUM 200-5 MCG/ACT IN AERO
2.0000 | INHALATION_SPRAY | Freq: Two times a day (BID) | RESPIRATORY_TRACT | Status: DC
  Administered 2018-10-20 – 2018-10-23 (×5): 2 via RESPIRATORY_TRACT
  Filled 2018-10-20: qty 8.8

## 2018-10-20 MED ORDER — ALUM & MAG HYDROXIDE-SIMETH 200-200-20 MG/5ML PO SUSP
30.0000 mL | Freq: Once | ORAL | Status: AC
  Administered 2018-10-20: 30 mL via ORAL
  Filled 2018-10-20: qty 30

## 2018-10-20 MED ORDER — ENOXAPARIN SODIUM 40 MG/0.4ML ~~LOC~~ SOLN
40.0000 mg | SUBCUTANEOUS | Status: DC
  Administered 2018-10-20 – 2018-10-21 (×2): 40 mg via SUBCUTANEOUS
  Filled 2018-10-20 (×2): qty 0.4

## 2018-10-20 MED ORDER — ALBUTEROL SULFATE (2.5 MG/3ML) 0.083% IN NEBU: 2.5000 mg | INHALATION_SOLUTION | RESPIRATORY_TRACT | Status: DC | PRN

## 2018-10-20 MED ORDER — ACETAMINOPHEN 325 MG PO TABS
650.0000 mg | ORAL_TABLET | Freq: Four times a day (QID) | ORAL | Status: DC | PRN
  Administered 2018-10-20 – 2018-10-22 (×2): 650 mg via ORAL
  Filled 2018-10-20 (×2): qty 2

## 2018-10-20 MED ORDER — GUAIFENESIN-DM 100-10 MG/5ML PO SYRP: 5.0000 mL | ORAL_SOLUTION | ORAL | Status: DC | PRN

## 2018-10-20 MED ORDER — SODIUM CHLORIDE 0.9 % IV SOLN
INTRAVENOUS | Status: DC | PRN
  Administered 2018-10-20 – 2018-10-22 (×3): 250 mL via INTRAVENOUS

## 2018-10-20 MED ORDER — NITROGLYCERIN 0.4 MG SL SUBL: 0.4000 mg | SUBLINGUAL_TABLET | SUBLINGUAL | Status: DC | PRN

## 2018-10-20 MED ORDER — POLYETHYLENE GLYCOL 3350 17 G PO PACK: 17.0000 g | PACK | Freq: Every day | ORAL | Status: DC | PRN

## 2018-10-20 MED ORDER — AMITRIPTYLINE HCL 10 MG PO TABS
20.0000 mg | ORAL_TABLET | Freq: Every day | ORAL | Status: DC
  Administered 2018-10-20 – 2018-10-21 (×2): 20 mg via ORAL
  Filled 2018-10-20 (×4): qty 2

## 2018-10-20 MED ORDER — ATORVASTATIN CALCIUM 80 MG PO TABS
80.0000 mg | ORAL_TABLET | Freq: Every day | ORAL | Status: DC
  Administered 2018-10-20 – 2018-10-21 (×2): 80 mg via ORAL
  Filled 2018-10-20 (×3): qty 1

## 2018-10-20 MED ORDER — FUROSEMIDE 10 MG/ML IJ SOLN
40.0000 mg | Freq: Two times a day (BID) | INTRAMUSCULAR | Status: DC
  Administered 2018-10-20 – 2018-10-22 (×4): 40 mg via INTRAVENOUS
  Filled 2018-10-20 (×5): qty 4

## 2018-10-20 MED ORDER — SODIUM CHLORIDE 0.9% FLUSH
3.0000 mL | Freq: Two times a day (BID) | INTRAVENOUS | Status: DC
  Administered 2018-10-20 – 2018-10-22 (×6): 3 mL via INTRAVENOUS

## 2018-10-20 MED ORDER — METHYLPREDNISOLONE SODIUM SUCC 125 MG IJ SOLR
60.0000 mg | Freq: Two times a day (BID) | INTRAMUSCULAR | Status: DC
  Administered 2018-10-20 – 2018-10-21 (×3): 60 mg via INTRAVENOUS
  Filled 2018-10-20 (×3): qty 2

## 2018-10-20 MED ORDER — ACETAMINOPHEN 650 MG RE SUPP: 650.0000 mg | Freq: Four times a day (QID) | RECTAL | Status: DC | PRN

## 2018-10-20 MED ORDER — SODIUM CHLORIDE 0.9 % IV SOLN
2.0000 g | Freq: Once | INTRAVENOUS | Status: AC
  Administered 2018-10-20: 2 g via INTRAVENOUS
  Filled 2018-10-20: qty 2

## 2018-10-20 MED ORDER — SODIUM CHLORIDE 0.9 % IV SOLN
1.0000 g | INTRAVENOUS | Status: DC
  Administered 2018-10-20 – 2018-10-22 (×3): 1 g via INTRAVENOUS
  Filled 2018-10-20 (×3): qty 1

## 2018-10-20 NOTE — ED Notes (Signed)
ED TO INPATIENT HANDOFF REPORT  ED Nurse Name and Phone #:  [phone] Judson Roch and [place] Name/Age/Gender Raven [name] 84 y.o. female Room/Bed: ED11A/ED11A  Code Status   Code Status: DNR  Home/SNF/Other Home Patient oriented to: self, place, time and situation Is this baseline? Yes   Triage Complete: Triage complete  Chief Complaint sob  Triage Note Pt arrived via [facility] from home. EMS reports that the pt c/o shortness of breath, fever, cough and reports that she has been having a hard time staying awake. EMS also reports that the home CO2 monitor was noted to be high which may indicate a possible CO2 leak in the home. Pt is AOx4, she is tachycardiac and she is febrile at 102.6. Jari Pigg, MD at the bedside.    Allergies Allergies  Allergen Reactions  . Penicillins Other (See Comments)    Reaction:  Unknown  Has patient had a PCN reaction causing immediate rash, facial/tongue/throat swelling, SOB or lightheadedness with hypotension: unknown Has patient had a PCN reaction causing severe rash involving mucus membranes or skin necrosis: unknown Has patient had a PCN reaction that required hospitalization No Has patient had a PCN reaction occurring within the last 10 years: No If all of the above answers are "NO", then may proceed with Cephalosporin use.   . Augmentin [Amoxicillin-Pot Clavulanate] Other (See Comments)    Reaction:  Unknown   . Indocin [Indomethacin] Other (See Comments)    Reaction:  Unknown   . Lodine [Etodolac] Other (See Comments)    Reaction:   GI upset   . Methotrexate Derivatives Other (See Comments)    Reaction:  GI upset   . Gold Rash  . Zantac [Ranitidine Hcl] Rash    Level of Care/Admitting Diagnosis ED Disposition    ED Disposition Condition [facility] Area: [place] [100120]  Level of Care: Telemetry [5]  Covid Evaluation: Confirmed COVID Negative  Diagnosis: COPD exacerbation [facility]) [[id number]]   Admitting Physician: Hillary Bow [[id number]]  Attending Physician: Hillary Bow [phone]  Estimated length of stay: past midnight tomorrow  Certification:: I certify this patient will need inpatient services for at least 2 midnights  PT Class (Do Not Modify): Inpatient [101]  PT Acc Code (Do Not Modify): Private [1]       B Medical/Surgery History Past Medical History:  Diagnosis Date  . Anemia   . Arthritis   . Asthma   . Complication of anesthesia   . COPD (chronic obstructive pulmonary disease) ([place])   . Crohn's disease ([place])   . Depression    after death of husband  . Hyperlipidemia   . Hypertension   . Myocardial infarction ([place])   . Osteopenia   . Peptic ulcer disease   . Pneumonia   . PONV (postoperative nausea and vomiting)    Past Surgical History:  Procedure Laterality Date  . ABDOMINAL SURGERY    . CARDIAC CATHETERIZATION     with stent  . COLONOSCOPY WITH PROPOFOL N/A 09/27/2014   Procedure: COLONOSCOPY WITH PROPOFOL;  Surgeon: [name], MD;  Location: [facility] ENDOSCOPY;  Service: Gastroenterology;  Laterality: N/A;  . EYE SURGERY     bilateral cataract surgeries  . SHOULDER SURGERY Right    x 2   . SMALL INTESTINE SURGERY       A IV Location/Drains/Wounds Patient Lines/Drains/Airways Status   Active Line/Drains/Airways    Name:   Placement date:   Placement time:   Site:  Days:   Peripheral IV 10/20/18 Left Antecubital   10/20/18    -    Antecubital   less than 1   Peripheral IV 10/20/18 Left;Lateral Forearm   10/20/18    1355    Forearm   less than 1          Intake/Output Last 24 hours  Intake/Output Summary (Last 24 hours) at 10/20/2018 1629 Last data filed at 10/20/2018 1402 Gross per 24 hour  Intake 1250 ml  Output -  Net 1250 ml    Labs/Imaging Results for orders placed or performed during the hospital encounter of 10/20/18 (from the past 48 hour(s))  Lactic acid, plasma     Status: None   Collection Time: 10/20/18 12:05 PM   Result Value Ref Range   Lactic Acid, Venous 1.4 0.5 - 1.9 mmol/L    Comment: Performed at [facility], [address] [place], [place] [address]  Comprehensive metabolic panel     Status: Abnormal   Collection Time: 10/20/18 12:05 PM  Result Value Ref Range   Sodium 140 135 - 145 mmol/L   Potassium 3.6 3.5 - 5.1 mmol/L   Chloride 107 98 - 111 mmol/L   CO2 24 22 - 32 mmol/L   Glucose, Bld 99 70 - 99 mg/dL   BUN 14 8 - 23 mg/dL   Creatinine, Ser 0.77 0.44 - 1.00 mg/dL   Calcium 8.2 (L) 8.9 - 10.3 mg/dL   Total Protein 6.6 6.5 - 8.1 g/dL   Albumin 3.8 3.5 - 5.0 g/dL   AST 20 15 - 41 U/L   ALT 24 0 - 44 U/L   Alkaline Phosphatase 66 38 - 126 U/L   Total Bilirubin 0.7 0.3 - 1.2 mg/dL   GFR calc non Af Amer >60 >60 mL/min   GFR calc Af Amer >60 >60 mL/min   Anion gap 9 5 - 15    Comment: Performed at [facility], [address] [place], [place] [address]  CBC WITH DIFFERENTIAL     Status: None   Collection Time: 10/20/18 12:05 PM  Result Value Ref Range   WBC 7.6 4.0 - 10.5 K/uL   RBC 4.01 3.87 - 5.11 MIL/uL   Hemoglobin 12.3 12.0 - 15.0 g/dL   HCT 39.4 36.0 - 46.0 %   MCV 98.3 80.0 - 100.0 fL   MCH 30.7 26.0 - 34.0 pg   MCHC 31.2 30.0 - 36.0 g/dL   RDW 14.2 11.5 - 15.5 %   Platelets 175 150 - 400 K/uL   nRBC 0.0 0.0 - 0.2 %   Neutrophils Relative % 62 %   Neutro Abs 4.8 1.7 - 7.7 K/uL   Lymphocytes Relative 21 %   Lymphs Abs 1.6 0.7 - 4.0 K/uL   Monocytes Relative 13 %   Monocytes Absolute 1.0 0.1 - 1.0 K/uL   Eosinophils Relative 2 %   Eosinophils Absolute 0.1 0.0 - 0.5 K/uL   Basophils Relative 1 %   Basophils Absolute 0.1 0.0 - 0.1 K/uL   Immature Granulocytes 1 %   Abs Immature Granulocytes 0.07 0.00 - 0.07 K/uL    Comment: Performed at [facility], [address] [place], [place] [address]  APTT     Status: None   Collection Time: 10/20/18 12:05 PM  Result Value Ref Range   aPTT 28 24 - 36 seconds    Comment: Performed at  [facility], [address] [name], [place] [address]  Protime-INR  Status: None   Collection Time: 10/20/18 12:05 PM  Result Value Ref Range   Prothrombin Time 12.7 11.4 - 15.2 seconds   INR 1.0 0.8 - 1.2    Comment: (NOTE) INR goal varies based on device and disease states. Performed at [facility], [address] [place], [place] [address]   Brain natriuretic peptide     Status: Abnormal   Collection Time: 10/20/18 12:05 PM  Result Value Ref Range   B Natriuretic Peptide 659.0 (H) 0.0 - 100.0 pg/mL    Comment: Performed at [facility], [address] [place], [place] [address]  Urinalysis, Routine w reflex microscopic     Status: Abnormal   Collection Time: 10/20/18 12:06 PM  Result Value Ref Range   Color, Urine YELLOW (A) YELLOW   APPearance CLEAR (A) CLEAR   Specific Gravity, Urine 1.016 1.005 - 1.030   pH 5.0 5.0 - 8.0   Glucose, UA NEGATIVE NEGATIVE mg/dL   Hgb urine dipstick SMALL (A) NEGATIVE   Bilirubin Urine NEGATIVE NEGATIVE   Ketones, ur 5 (A) NEGATIVE mg/dL   Protein, ur 30 (A) NEGATIVE mg/dL   Nitrite NEGATIVE NEGATIVE   Leukocytes,Ua NEGATIVE NEGATIVE   RBC / HPF 0-5 0 - 5 RBC/hpf   WBC, UA 0-5 0 - 5 WBC/hpf   Bacteria, UA NONE SEEN NONE SEEN   Squamous Epithelial / LPF NONE SEEN 0 - 5   Mucus PRESENT     Comment: Performed at [facility], [address], [place], [place] [address]  SARS Coronavirus 2 [facility] order, Performed in [facility] hospital lab) Nasopharyngeal Nasopharyngeal Swab     Status: None   Collection Time: 10/20/18 12:06 PM   Specimen: Nasopharyngeal Swab  Result Value Ref Range   SARS Coronavirus 2 NEGATIVE NEGATIVE    Comment: (NOTE) If result is NEGATIVE SARS-CoV-2 target nucleic acids are NOT DETECTED. The SARS-CoV-2 RNA is generally detectable in upper and lower  respiratory specimens during the acute phase of infection. The lowest  concentration of SARS-CoV-2 viral copies this  assay can detect is 250  copies / mL. A negative result does not preclude SARS-CoV-2 infection  and should not be used as the sole basis for treatment or other  patient management decisions.  A negative result may occur with  improper specimen collection / handling, submission of specimen other  than nasopharyngeal swab, presence of viral mutation(s) within the  areas targeted by this assay, and inadequate number of viral copies  (<250 copies / mL). A negative result must be combined with clinical  observations, patient history, and epidemiological information. If result is POSITIVE SARS-CoV-2 target nucleic acids are DETECTED. The SARS-CoV-2 RNA is generally detectable in upper and lower  respiratory specimens dur ing the acute phase of infection.  Positive  results are indicative of active infection with SARS-CoV-2.  Clinical  correlation with patient history and other diagnostic information is  necessary to determine patient infection status.  Positive results do  not rule out bacterial infection or co-infection with other viruses. If result is PRESUMPTIVE POSTIVE SARS-CoV-2 nucleic acids MAY BE PRESENT.   A presumptive positive result was obtained on the submitted specimen  and confirmed on repeat testing.  While 2019 novel coronavirus  (SARS-CoV-2) nucleic acids may be present in the submitted sample  additional confirmatory testing may be necessary for epidemiological  and / or clinical management purposes  to differentiate between  SARS-CoV-2 and other Sarbecovirus currently known to infect humans.  If clinically indicated additional testing with an alternate test  methodology [phone]) is advised. The SARS-CoV-2 RNA is generally  detectable in upper and lower respiratory sp ecimens during the acute  phase of infection. The expected result is Negative. Fact Sheet for Patients:  StrictlyIdeas.no Fact Sheet for Healthcare  Providers: BankingDealers.co.za This test is not yet approved or cleared by the [place] FDA and has been authorized for detection and/or diagnosis of SARS-CoV-2 by FDA under an Emergency Use Authorization (EUA).  This EUA will remain in effect (meaning this test can be used) for the duration of the COVID-19 declaration under Section 564(b)(1) of the Act, 21 U.S.C. section 360bbb-3(b)(1), unless the authorization is terminated or revoked sooner. Performed at [facility], [address] [place], [place] [address]   Lactic acid, plasma     Status: None   Collection Time: 10/20/18  2:08 PM  Result Value Ref Range   Lactic Acid, Venous 1.2 0.5 - 1.9 mmol/L    Comment: Performed at [facility], [address] [place], [place] [address]   Dg Chest Port 1 View  Result Date: 10/20/2018 CLINICAL DATA:  Acute shortness of breath, fever and cough EXAM: PORTABLE CHEST 1 VIEW COMPARISON:  04/23/2016 and prior exams FINDINGS: The cardiomediastinal silhouette is unremarkable. Pulmonary vascular congestion noted. Elevated RIGHT hemidiaphragm again noted with minimal bibasilar atelectasis. There is no evidence of focal airspace disease, pulmonary edema, suspicious pulmonary nodule/mass, pleural effusion, or pneumothorax. No acute bony abnormalities are identified. IMPRESSION: Pulmonary vascular congestion and minimal bibasilar atelectasis. Electronically Signed   By: Margarette Canada M.D.   On: 10/20/2018 12:56    Pending Labs Unresulted Labs (From admission, onward)    Start     Ordered   10/27/18 0500  Creatinine, serum  (enoxaparin (LOVENOX)    CrCl >/= 30 ml/min)  Weekly,   STAT    Comments: while on enoxaparin therapy   Question:  Specimen collection method  Answer:  IV Team=IV Team collect   10/20/18 1500   10/21/18 1540  Basic metabolic panel  Tomorrow morning,   STAT    Question:  Specimen collection method  Answer:  IV Team=IV Team collect    10/20/18 1500   10/21/18 0500  CBC  Tomorrow morning,   STAT    Question:  Specimen collection method  Answer:  IV Team=IV Team collect   10/20/18 1500   10/20/18 1202  Carbon monoxide, blood (performed at ref lab)  Once,   STAT     10/20/18 1201   10/20/18 1200  Blood Culture (routine x 2)  BLOOD CULTURE X 2,   STAT     10/20/18 1200   10/20/18 1200  Urine culture  ONCE - STAT,   STAT     10/20/18 1200          Vitals/Pain Today's Vitals   10/20/18 1515 10/20/18 1520 10/20/18 1530 10/20/18 1545  BP: 139/66  137/72 (!) 149/73  Pulse:      Resp: (!) 23  (!) 23 (!) 25  Temp:  99.2 F (37.3 C)    TempSrc:  Oral    SpO2:      Weight:      Height:      PainSc:        Isolation Precautions No active isolations  Medications Medications  enoxaparin (LOVENOX) injection 40 mg (has no administration in time range)  sodium chloride flush (NS) 0.9 % injection 3 mL (3 mLs Intravenous Given 10/20/18  1628)  acetaminophen (TYLENOL) tablet 650 mg (has no administration in time range)    Or  acetaminophen (TYLENOL) suppository 650 mg (has no administration in time range)  polyethylene glycol (MIRALAX / GLYCOLAX) packet 17 g (has no administration in time range)  ondansetron (ZOFRAN) tablet 4 mg (has no administration in time range)    Or  ondansetron (ZOFRAN) injection 4 mg (has no administration in time range)  albuterol (PROVENTIL) (2.5 MG/3ML) 0.083% nebulizer solution 2.5 mg (has no administration in time range)  furosemide (LASIX) injection 60 mg (has no administration in time range)  methylPREDNISolone sodium succinate (SOLU-MEDROL) 125 mg/2 mL injection 60 mg (has no administration in time range)  azithromycin (ZITHROMAX) 500 mg in sodium chloride 0.9 % 250 mL IVPB (has no administration in time range)  cefTRIAXone (ROCEPHIN) 1 g in sodium chloride 0.9 % 100 mL IVPB (has no administration in time range)  furosemide (LASIX) injection 40 mg (has no administration in time range)   ceFEPIme (MAXIPIME) 2 g in sodium chloride 0.9 % 100 mL IVPB (0 g Intravenous Stopped 10/20/18 1301)  azithromycin (ZITHROMAX) 500 mg in sodium chloride 0.9 % 250 mL IVPB (0 mg Intravenous Stopped 10/20/18 1402)  sodium chloride 0.9 % bolus 1,000 mL (0 mLs Intravenous Stopped 10/20/18 1345)  acetaminophen (TYLENOL) tablet 1,000 mg (1,000 mg Oral Given 10/20/18 1216)    Mobility walks Low fall risk   Focused Assessments    R Recommendations: See Admitting Provider Note  Report given to:   Additional Notes:

## 2018-10-20 NOTE — Progress Notes (Signed)
CODE SEPSIS - PHARMACY COMMUNICATION  **Broad Spectrum Antibiotics should be administered within 1 hour of Sepsis diagnosis**  Time Code Sepsis Called/Page Received: 1209  Antibiotics Ordered: Azithromycin, Cefepime   Time of 1st antibiotic administration: Cefepime 1226  Additional action taken by pharmacy: none indicated   If necessary, Name of Provider/Nurse Contacted: N/A    Jerol Rufener L ,RPh 10/20/2018  12:09 PM

## 2018-10-20 NOTE — ED Triage Notes (Signed)
Pt arrived via [facility] from home. EMS reports that the pt c/o shortness of breath, fever, cough and reports that she has been having a hard time staying awake. EMS also reports that the home CO2 monitor was noted to be high which may indicate a possible CO2 leak in the home. Pt is AOx4, she is tachycardiac and she is febrile at 102.6. Jari Pigg, MD at the bedside.

## 2018-10-20 NOTE — H&P (Signed)
[facility] NAME: Raven [name]    MR#:  [id number]  DATE OF BIRTH:  01/23/1935  DATE OF ADMISSION:  10/20/2018  PRIMARY CARE PHYSICIAN: Baxter Hire, MD   REQUESTING/REFERRING PHYSICIAN: Dr. Jari Pigg  CHIEF COMPLAINT:   Chief Complaint  Patient presents with  . Shortness of Breath    HISTORY OF PRESENT ILLNESS:  Raven [name]  is a 84 y.o. female with a known history of COPD, hypertension, CAD, ischemic cardiomyopathy of ejection fraction 45% on 2 L oxygen at home presents to the hospital complaining of worsening shortness of breath and cough since yesterday.  Patient was seen by her pulmonologist Dr. Raul Del on 09/19/2018 and started on prednisone and antibiotics for COPD exacerbation.  She finished her antibiotics 2 weeks back.  Cough has not improved and now productive with yellow-green sputum.  Also has right-sided pleuritic chest pain.  Found to have fever of 102, tachycardia.  Normal WBC.  PAST MEDICAL HISTORY:   Past Medical History:  Diagnosis Date  . Anemia   . Arthritis   . Asthma   . Complication of anesthesia   . COPD (chronic obstructive pulmonary disease) ([place])   . Crohn's disease ([place])   . Depression    after death of husband  . Hyperlipidemia   . Hypertension   . Myocardial infarction ([place])   . Osteopenia   . Peptic ulcer disease   . Pneumonia   . PONV (postoperative nausea and vomiting)     PAST SURGICAL HISTORY:   Past Surgical History:  Procedure Laterality Date  . ABDOMINAL SURGERY    . CARDIAC CATHETERIZATION     with stent  . COLONOSCOPY WITH PROPOFOL N/A 09/27/2014   Procedure: COLONOSCOPY WITH PROPOFOL;  Surgeon: [name], MD;  Location: [facility] ENDOSCOPY;  Service: Gastroenterology;  Laterality: N/A;  . EYE SURGERY     bilateral cataract surgeries  . SHOULDER SURGERY Right    x 2   . SMALL INTESTINE SURGERY      SOCIAL HISTORY:   Social History   Tobacco Use  . Smoking status:  Former Smoker    Quit date: 1974    Years since quitting: 46.6  . Smokeless tobacco: Never Used  Substance Use Topics  . Alcohol use: Yes    Comment: occassional    FAMILY HISTORY:   Family History  Problem Relation Age of Onset  . Hypertension Other     DRUG ALLERGIES:   Allergies  Allergen Reactions  . Penicillins Other (See Comments)    Reaction:  Unknown  Has patient had a PCN reaction causing immediate rash, facial/tongue/throat swelling, SOB or lightheadedness with hypotension: unknown Has patient had a PCN reaction causing severe rash involving mucus membranes or skin necrosis: unknown Has patient had a PCN reaction that required hospitalization No Has patient had a PCN reaction occurring within the last 10 years: No If all of the above answers are "NO", then may proceed with Cephalosporin use.   . Augmentin [Amoxicillin-Pot Clavulanate] Other (See Comments)    Reaction:  Unknown   . Indocin [Indomethacin] Other (See Comments)    Reaction:  Unknown   . Lodine [Etodolac] Other (See Comments)    Reaction:   GI upset   . Methotrexate Derivatives Other (See Comments)    Reaction:  GI upset   . Gold Rash  . Zantac [Ranitidine Hcl] Rash    REVIEW OF SYSTEMS:   Review of Systems  Constitutional:  Positive for malaise/fatigue. Negative for chills and fever.  HENT: Negative for sore throat.   Eyes: Negative for blurred vision, double vision and pain.  Respiratory: Positive for cough, shortness of breath and wheezing. Negative for hemoptysis.   Cardiovascular: Positive for chest pain and orthopnea. Negative for palpitations and leg swelling.  Gastrointestinal: Negative for abdominal pain, constipation, diarrhea, heartburn, nausea and vomiting.  Genitourinary: Negative for dysuria and hematuria.  Musculoskeletal: Negative for back pain and joint pain.  Skin: Negative for rash.  Neurological: Negative for sensory change, speech change, focal weakness and headaches.   Endo/Heme/Allergies: Does not bruise/bleed easily.  Psychiatric/Behavioral: Negative for depression. The patient is not nervous/anxious.     MEDICATIONS AT HOME:   Prior to Admission medications   Medication Sig Start Date End Date Taking? Authorizing Provider  Adalimumab (HUMIRA) 40 MG/0.8ML PSKT Inject 40 mg into the skin every 14 (fourteen) days.    Yes [provider]  amitriptyline (ELAVIL) 10 MG tablet Take 20 mg by mouth at bedtime.   Yes [provider]  amLODipine-benazepril (LOTREL) 5-20 MG per capsule Take 1 capsule by mouth daily.    Yes [provider]  aspirin EC 81 MG tablet Take 81 mg by mouth daily.   Yes [provider]  atorvastatin (LIPITOR) 80 MG tablet Take 80 mg by mouth at bedtime.    Yes [provider]  Calcium-Phosphorus-Vitamin D (CITRACAL +D3 PO) Take 1 tablet by mouth daily.   Yes [provider]  Chlorpheniramine Maleate (ALLERGY PO) Take 1 tablet by mouth daily.   Yes [provider]  clopidogrel (PLAVIX) 75 MG tablet Take 75 mg by mouth daily.   Yes [provider]  Cyanocobalamin (B-12 PO) Take 1 tablet by mouth daily. Triple B-12   Yes [provider]  Fluticasone-Salmeterol (ADVAIR) 250-50 MCG/DOSE AEPB Inhale 1 puff into the lungs 2 (two) times daily.   Yes [provider]  hydrochlorothiazide (HYDRODIURIL) 25 MG tablet Take 25 mg by mouth daily.    Yes [provider]  mesalamine (PENTASA) 500 MG CR capsule Take 1,000 mg by mouth 2 (two) times daily.   Yes [provider]  metoprolol succinate (TOPROL-XL) 25 MG 24 hr tablet Take 25 mg by mouth daily.   Yes [provider]  Multiple Vitamin (MULTIVITAMIN WITH MINERALS) TABS tablet Take 1 tablet by mouth daily.   Yes [provider]  potassium chloride (K-DUR,KLOR-CON) 10 MEQ tablet Take 10 mEq by mouth daily.    Yes [provider]  risedronate (ACTONEL) 150 MG tablet Take  150 mg by mouth every 30 (thirty) days. with water on empty stomach, nothing by mouth or lie down for next 30 minutes.   Yes [provider]  senna (SENOKOT) 8.6 MG tablet Take 2 tablets by mouth at bedtime.    Yes [provider]  thiamine (VITAMIN B-1) 100 MG tablet Take 100 mg by mouth daily.   Yes [provider]  vitamin E 400 UNIT capsule Take 400 Units by mouth daily.   Yes [provider]  albuterol (PROVENTIL HFA;VENTOLIN HFA) 108 (90 BASE) MCG/ACT inhaler Inhale 2 puffs into the lungs every 6 (six) hours as needed for wheezing or shortness of breath.    [provider]  benzonatate (TESSALON) 100 MG capsule Take by mouth 3 (three) times daily as needed for cough.    [provider]  guaiFENesin-dextromethorphan (ROBITUSSIN DM) 100-10 MG/5ML syrup Take 5 mLs by mouth every 4 (four)  hours as needed for cough. Patient not taking: Reported on 10/20/2018 04/20/16   Max Sane, MD  levofloxacin (LEVAQUIN) 750 MG tablet Take 1 tablet (750 mg total) by mouth daily. Patient not taking: Reported on 10/20/2018 04/24/16   Bettey Costa, MD  nitroGLYCERIN (NITROSTAT) 0.4 MG SL tablet Place 0.4 mg under the tongue every 5 (five) minutes as needed for chest pain.    [provider]  predniSONE (STERAPRED UNI-PAK 21 TAB) 10 MG (21) TBPK tablet Take 1 tablet (10 mg total) by mouth daily. As directed on box then resume your home dose of prednisone Patient not taking: Reported on 10/20/2018 04/24/16   Bettey Costa, MD     VITAL SIGNS:  Blood pressure (!) 150/65, pulse (!) 31, temperature 99.6 F (37.6 C), temperature source Oral, resp. rate (!) 27, height 5' 6"  (1.676 m), weight 77.1 kg, SpO2 (!) 87 %.  PHYSICAL EXAMINATION:  Physical Exam  GENERAL:  84 y.o.-year-old patient lying in the bed with conversational dyspnea EYES: Pupils equal, round, reactive to light and accommodation. No scleral icterus. Extraocular muscles intact.  HEENT: Head  atraumatic, normocephalic. Oropharynx and nasopharynx clear. No oropharyngeal erythema, moist oral mucosa  NECK:  Supple, no jugular venous distention. No thyroid enlargement, no tenderness.  LUNGS: Bilateral wheezing and decreased air entry.  Increased work of breathing CARDIOVASCULAR: S1, S2 normal. No murmurs, rubs, or gallops.  ABDOMEN: Soft, nontender, nondistended. Bowel sounds present. No organomegaly or mass.  EXTREMITIES: No  cyanosis, or clubbing. + 2 pedal & radial pulses b/l.  1+ lower extremity edema NEUROLOGIC: Cranial nerves II through XII are intact. No focal Motor or sensory deficits appreciated b/l PSYCHIATRIC: The patient is alert and oriented x 3. Good affect.  SKIN: No obvious rash, lesion, or ulcer.   LABORATORY PANEL:   CBC Recent Labs  Lab 10/20/18 1205  WBC 7.6  HGB 12.3  HCT 39.4  PLT 175   ------------------------------------------------------------------------------------------------------------------  Chemistries  Recent Labs  Lab 10/20/18 1205  NA 140  K 3.6  CL 107  CO2 24  GLUCOSE 99  BUN 14  CREATININE 0.77  CALCIUM 8.2*  AST 20  ALT 24  ALKPHOS 66  BILITOT 0.7   ------------------------------------------------------------------------------------------------------------------  Cardiac Enzymes No results for input(s): TROPONINI in the last 168 hours. ------------------------------------------------------------------------------------------------------------------  RADIOLOGY:  Dg Chest Port 1 View  Result Date: 10/20/2018 CLINICAL DATA:  Acute shortness of breath, fever and cough EXAM: PORTABLE CHEST 1 VIEW COMPARISON:  04/23/2016 and prior exams FINDINGS: The cardiomediastinal silhouette is unremarkable. Pulmonary vascular congestion noted. Elevated RIGHT hemidiaphragm again noted with minimal bibasilar atelectasis. There is no evidence of focal airspace disease, pulmonary edema, suspicious pulmonary nodule/mass, pleural effusion, or  pneumothorax. No acute bony abnormalities are identified. IMPRESSION: Pulmonary vascular congestion and minimal bibasilar atelectasis. Electronically Signed   By: Margarette Canada M.D.   On: 10/20/2018 12:56     IMPRESSION AND PLAN:   *Acute on chronic hypoxic respiratory failure secondary to COPD exacerbation and CHF Continue oxygen and wean to 2 L as tolerated. Nebulizers.  *Acute COPD exacerbation with sepsis present on admission -IV steroids, Antibiotics - Scheduled Nebulizers - Inhalers -Wean O2 as tolerated - Consult pulmonary if no improvement.  Patient follows with Dr. Raul Del Do not see any infiltrates on chest x-ray but with fever and right-sided pleuritic pain we will treat this as possible pneumonia.  On IV antibiotics. Repeat chest x-ray in the morning Her sepsis could also be secondary to bronchitis.  *Acute CHF.  Seems fluid overloaded with lower extremity edema, crackles and orthopnea.  Her ejection fraction was 45% in 2017.  No further echocardiograms available. Will give 1 dose of IV Lasix.  Check BNP.  Check echocardiogram.  Consult cardiology if no improvement  *Hypertension.  Continue amlodipine and and benazepril.  Hold hydrochlorothiazide.  DVT prophylaxis with Lovenox  All the records are reviewed and case discussed with ED provider. Management plans discussed with the patient, family and they are in agreement.  CODE STATUS: DNR/DNI  TOTAL TIME TAKING CARE OF THIS PATIENT: 45 minutes.   Leia Alf Allaya Abbasi M.D on 10/20/2018 at 3:08 PM  Between 7am to 6pm - Pager - [phone]  After 6pm go to [email or web address] - password [email or web address] [facility]  Office  [phone]  CC: Primary care physician; Baxter Hire, MD  Note: This dictation was prepared with Dragon dictation along with smaller phrase technology. Any transcriptional errors that result from this process are unintentional.

## 2018-10-20 NOTE — ED Notes (Signed)
Attempted second IV start multiple times without success by this RN or Fanny Skates, Therapist, sports. Unable to get second IV or blood cultures at this time. IV team consult will be placed due to septic work-up of pt and the need for IV abx and fluids. Jari Pigg, MD aware and agrees with consult.

## 2018-10-20 NOTE — ED Notes (Signed)
Pt requesting medications for a headache at this time.

## 2018-10-20 NOTE — ED Notes (Signed)
Pt's daughter has been updated.

## 2018-10-20 NOTE — Progress Notes (Signed)
PHARMACY -  BRIEF ANTIBIOTIC NOTE   Pharmacy has received consult(s) for cefepime from an ED provider.  The patient's profile has been reviewed for ht/wt/allergies/indication/available labs.    One time order(s) placed for Cefepime 2g IV x 1.   Further antibiotics/pharmacy consults should be ordered by admitting physician if indicated.                       Thank you, Simpson,Michael L 10/20/2018  12:12 PM

## 2018-10-20 NOTE — ED Provider Notes (Signed)
[facility] Emergency Department Provider Note  ____________________________________________   First MD Initiated Contact with Patient 10/20/18 1151     (approximate)  I have reviewed the triage vital signs and the nursing notes.   HISTORY  Chief Complaint Shortness of Breath    HPI Raven [name] is a 84 y.o. female with COPD on 3 L at baseline, prior MI who presents with shortness of breath.  Patient versus feeling more short of breath over the past few days.'s been associate with a cough.  On EMSs arrival she was febrile to 102 as well as tachycardic.  Patient herself says shortness of breath has been intermittent, moderate, nothing makes it better, worse with exertion.  She has associated cough with it.           Past Medical History:  Diagnosis Date  . Anemia   . Arthritis   . Asthma   . Complication of anesthesia   . COPD (chronic obstructive pulmonary disease) ([place])   . Crohn's disease ([place])   . Depression    after death of husband  . Hyperlipidemia   . Hypertension   . Myocardial infarction ([place])   . Osteopenia   . Peptic ulcer disease   . Pneumonia   . PONV (postoperative nausea and vomiting)     Patient Active Problem List   Diagnosis Date Noted  . COPD (chronic obstructive pulmonary disease) ([place]) 04/22/2016  . Acute bronchitis 04/17/2016  . Sepsis ([place]) 04/17/2016  . Peroneal tendonitis 07/18/2015  . Pneumonia 04/19/2015  . CAP (community acquired pneumonia) 11/02/2014    Past Surgical History:  Procedure Laterality Date  . ABDOMINAL SURGERY    . CARDIAC CATHETERIZATION     with stent  . COLONOSCOPY WITH PROPOFOL N/A 09/27/2014   Procedure: COLONOSCOPY WITH PROPOFOL;  Surgeon: [name], MD;  Location: [facility] ENDOSCOPY;  Service: Gastroenterology;  Laterality: N/A;  . EYE SURGERY     bilateral cataract surgeries  . SHOULDER SURGERY Right    x 2   . SMALL INTESTINE SURGERY      Prior to Admission medications    Medication Sig Start Date End Date Taking? Authorizing Provider  acetaminophen (TYLENOL) 500 MG tablet Take 1,000 mg by mouth See admin instructions. Takes 2 tablets at night and during the day only if needed for pain    [provider]  Adalimumab (HUMIRA) 40 MG/0.8ML PSKT Inject 40 mg into the skin every 14 (fourteen) days.     [provider]  albuterol (PROVENTIL HFA;VENTOLIN HFA) 108 (90 BASE) MCG/ACT inhaler Inhale 2 puffs into the lungs every 6 (six) hours as needed for wheezing or shortness of breath.    [provider]  amitriptyline (ELAVIL) 10 MG tablet Take 20 mg by mouth at bedtime.    [provider]  amLODipine-benazepril (LOTREL) 5-20 MG per capsule Take 1 capsule by mouth daily.     [provider]  aspirin EC 81 MG tablet Take 81 mg by mouth daily.    [provider]  atorvastatin (LIPITOR) 80 MG tablet Take 80 mg by mouth at bedtime.     [provider]  benzonatate (TESSALON) 100 MG capsule Take by mouth 3 (three) times daily as needed for cough.    [provider]  Calcium-Phosphorus-Vitamin D (CITRACAL +D3 PO) Take 1 tablet by mouth daily.    [provider]  Chlorpheniramine Maleate (ALLERGY PO) Take 1 tablet by mouth daily.    [provider]  clopidogrel (PLAVIX) 75 MG tablet Take 75 mg by mouth daily.    [provider]  Cyanocobalamin (B-12 PO) Take 1 tablet by mouth daily. Triple B-12    [provider]  Fluticasone-Salmeterol (ADVAIR) 250-50 MCG/DOSE AEPB Inhale 1 puff into the lungs 2 (two) times daily.    [provider]  guaiFENesin-dextromethorphan (ROBITUSSIN DM) 100-10 MG/5ML syrup Take 5 mLs by mouth every 4 (four) hours as needed for cough. 04/20/16   Max Sane, MD  hydrochlorothiazide (HYDRODIURIL) 25 MG tablet Take 25 mg by mouth daily.     [provider]  levofloxacin (LEVAQUIN) 750 MG tablet Take 1 tablet (750 mg total) by mouth  daily. 04/24/16   Bettey Costa, MD  mesalamine (PENTASA) 500 MG CR capsule Take 1,000 mg by mouth 2 (two) times daily.    [provider]  metoprolol succinate (TOPROL-XL) 25 MG 24 hr tablet Take 25 mg by mouth daily.    [provider]  Multiple Vitamin (MULTIVITAMIN WITH MINERALS) TABS tablet Take 1 tablet by mouth daily.    [provider]  nitroGLYCERIN (NITROSTAT) 0.4 MG SL tablet Place 0.4 mg under the tongue every 5 (five) minutes as needed for chest pain.    [provider]  potassium chloride (K-DUR,KLOR-CON) 10 MEQ tablet Take 10 mEq by mouth daily.     [provider]  predniSONE (STERAPRED UNI-PAK 21 TAB) 10 MG (21) TBPK tablet Take 1 tablet (10 mg total) by mouth daily. As directed on box then resume your home dose of prednisone 04/24/16   Bettey Costa, MD  risedronate (ACTONEL) 150 MG tablet Take 150 mg by mouth every 30 (thirty) days. with water on empty stomach, nothing by mouth or lie down for next 30 minutes.    [provider]  senna (SENOKOT) 8.6 MG tablet Take 2 tablets by mouth at bedtime.     [provider]  thiamine (VITAMIN B-1) 100 MG tablet Take 100 mg by mouth daily.    [provider]  vitamin E 400 UNIT capsule Take 400 Units by mouth daily.    [provider]    Allergies Penicillins, Augmentin [amoxicillin-pot clavulanate], Indocin [indomethacin], Lodine [etodolac], Methotrexate derivatives, Gold, and Zantac [ranitidine hcl]  Family History  Problem Relation Age of Onset  . Hypertension Unknown     Social History Social History   Tobacco Use  . Smoking status: Former Smoker    Quit date: 1974    Years since quitting: 46.6  . Smokeless tobacco: Never Used  Substance Use Topics  . Alcohol use: Yes    Comment: occassional  . Drug use: No      Review of Systems Constitutional: Positive fever Eyes: No visual changes. ENT: No sore throat. Cardiovascular: No chest pain  Respiratory: Positive for SOB, positive cough Gastrointestinal: No abdominal pain.  No nausea, no vomiting.  No diarrhea.  No constipation. Genitourinary: Negative for dysuria. Musculoskeletal: Negative for back pain. Skin: Negative for rash. Neurological: Negative for headaches, focal weakness or numbness. All other ROS negative ____________________________________________   PHYSICAL EXAM:  VITAL SIGNS: Blood pressure (!) 163/66, pulse (!) 31, temperature 99.6 F (37.6 C), temperature source Oral, resp. rate (!) 31, height 5' 6"  (1.676 m), weight 77.1 kg, SpO2 (!) 87 %.  Constitutional: Alert and oriented. Well appearing and in no acute distress. Eyes: Conjunctivae are normal. EOMI. Head: Atraumatic. Nose: No congestion/rhinnorhea. Mouth/Throat: Mucous membranes are moist.   Neck: No stridor. Trachea Midline. FROM Cardiovascular:  Normal rate, regular rhythm. Grossly normal heart sounds.  Good peripheral circulation. Respiratory: Mild increased work of breathing with some mild rales, no wheezing Gastrointestinal: Soft and nontender. No distention. No abdominal bruits.  Musculoskeletal: No lower extremity tenderness nor edema.  No joint effusions. Neurologic:  Normal speech and language. No gross focal neurologic deficits are appreciated.  Skin:  Skin is warm, dry and intact. No rash noted. Psychiatric: Mood and affect are normal. Speech and behavior are normal. GU: Deferred   ____________________________________________   LABS (all labs ordered are listed, but only abnormal results are displayed)  Labs Reviewed  COMPREHENSIVE METABOLIC PANEL - Abnormal; Notable for the following components:      Result Value   Calcium 8.2 (*)    All other components within normal limits  URINALYSIS, ROUTINE W REFLEX MICROSCOPIC - Abnormal; Notable for the following components:   Color, Urine YELLOW (*)    APPearance CLEAR (*)    Hgb urine dipstick SMALL (*)    Ketones, ur 5 (*)     Protein, ur 30 (*)    All other components within normal limits  SARS CORONAVIRUS 2 (HOSPITAL ORDER, [place] [facility])  CULTURE, BLOOD (ROUTINE X 2)  CULTURE, BLOOD (ROUTINE X 2)  URINE CULTURE  LACTIC ACID, PLASMA  LACTIC ACID, PLASMA  CBC WITH DIFFERENTIAL/PLATELET  APTT  PROTIME-INR  CARBON MONOXIDE, BLOOD (PERFORMED AT REF LAB)   ____________________________________________   ED ECG REPORT I, Vanessa Cave, the attending physician, personally viewed and interpreted this ECG.  EKG sinus tachycardia no ST elevation, no T wave inversion except for in lead aVL, normal intervals otherwise ____________________________________________  RADIOLOGY Robert Bellow, personally viewed and evaluated these images (plain radiographs) as part of my medical decision making, as well as reviewing the written report by the radiologist.  ED MD interpretation: Chest x-ray without obvious pneumonia.  Official radiology report(s): Dg Chest Port 1 View  Result Date: 10/20/2018 CLINICAL DATA:  Acute shortness of breath, fever and cough EXAM: PORTABLE CHEST 1 VIEW COMPARISON:  04/23/2016 and prior exams FINDINGS: The cardiomediastinal silhouette is unremarkable. Pulmonary vascular congestion noted. Elevated RIGHT hemidiaphragm again noted with minimal bibasilar atelectasis. There is no evidence of focal airspace disease, pulmonary edema, suspicious pulmonary nodule/mass, pleural effusion, or pneumothorax. No acute bony abnormalities are identified. IMPRESSION: Pulmonary vascular congestion and minimal bibasilar atelectasis. Electronically Signed   By: Margarette Canada M.D.   On: 10/20/2018 12:56    ____________________________________________   PROCEDURES  Procedure(s) performed (including Critical Care):  .Critical Care Performed by: Vanessa Rooks, MD Authorized by: Vanessa [place], MD   Critical care provider statement:    Critical care time (minutes):  30   Critical care was  necessary to treat or prevent imminent or life-threatening deterioration of the following conditions:  Sepsis   Critical care was time spent personally by me on the following activities:  Discussions with consultants, evaluation of patient's response to treatment, examination of patient, ordering and performing treatments and interventions, ordering and review of laboratory studies, ordering and review of radiographic studies, pulse oximetry, re-evaluation of patient's condition, obtaining history from patient or surrogate and review of old charts     ____________________________________________   INITIAL IMPRESSION / ASSESSMENT AND PLAN / ED COURSE   Raven [name] was evaluated in Emergency Department on 10/20/2018 for the symptoms described in the history of present illness. She was evaluated in the context of the global COVID-19 pandemic, which necessitated consideration  that the patient might be at risk for infection with the SARS-CoV-2 virus that causes COVID-19. Institutional protocols and algorithms that pertain to the evaluation of patients at risk for COVID-19 are in a state of rapid change based on information released by regulatory bodies including the CDC and federal and state organizations. These policies and algorithms were followed during the patient's care in the ED.     Pt presents with SOB. Differential includes: PNA-will get xray to evaluation Anemia-CBC to evaluate ACS- will get trops Arrhythmia-Will get EKG and keep on monitor.  COVID- will get testing per algorithm. PE-lower suspicion given no risk factors and other cause more likely , Monoxide poisoning, reported by EMS that her level was slightly elevated at 10%.  Will confirm with a check here.  Given patient's tachycardia and fever patient meets sirs criteria.  Holding on full fluid resuscitation given her history of heart failure that she is on Lasix for her blood pressures are stable.  However will cover for  pneumonia.  No wheezing at this time so we will hold off on duo nebs/steroids.   UA without evidence of UTI.  Lactate is normal.  Coronavirus testing is negative  Chest x-ray without evidence of obvious pneumonia  Given patient that sirs criteria will be to the hospital for possible pneumonia.   With the hospital team and will admit patient  ____________________________________________   FINAL CLINICAL IMPRESSION(S) / ED DIAGNOSES   Final diagnoses:  Sepsis, due to unspecified organism, unspecified whether acute organ dysfunction present [facility])     MEDICATIONS GIVEN DURING THIS VISIT:  Medications  ceFEPIme (MAXIPIME) 2 g in sodium chloride 0.9 % 100 mL IVPB (0 g Intravenous Stopped 10/20/18 1301)  azithromycin (ZITHROMAX) 500 mg in sodium chloride 0.9 % 250 mL IVPB (500 mg Intravenous New Bag/Given 10/20/18 1302)  sodium chloride 0.9 % bolus 1,000 mL (0 mLs Intravenous Stopped 10/20/18 1345)  acetaminophen (TYLENOL) tablet 1,000 mg (1,000 mg Oral Given 10/20/18 1216)     ED Discharge Orders    None       Note:  This document was prepared using Dragon voice recognition software and may include unintentional dictation errors.   Vanessa [place], MD 10/20/18 1530

## 2018-10-21 ENCOUNTER — Inpatient Hospital Stay (HOSPITAL_COMMUNITY)
Admit: 2018-10-21 | Discharge: 2018-10-21 | Disposition: A | Discharge status: Home / Self Care | Payer: Medicare Other | Attending: Internal Medicine | Admitting: Internal Medicine

## 2018-10-21 ENCOUNTER — Inpatient Hospital Stay: Payer: Medicare Other

## 2018-10-21 DIAGNOSIS — I340 Nonrheumatic mitral (valve) insufficiency: Secondary | ICD-10-CM

## 2018-10-21 DIAGNOSIS — I361 Nonrheumatic tricuspid (valve) insufficiency: Secondary | ICD-10-CM

## 2018-10-21 LAB — BASIC METABOLIC PANEL
Anion gap: 12 (ref 5–15)
BUN: 17 mg/dL (ref 8–23)
CO2: 25 mmol/L (ref 22–32)
Calcium: 7.6 mg/dL — ABNORMAL LOW (ref 8.9–10.3)
Chloride: 105 mmol/L (ref 98–111)
Creatinine, Ser: 0.89 mg/dL (ref 0.44–1.00)
GFR calc Af Amer: >60 mL/min (ref >60)
GFR calc non Af Amer: >60 mL/min (ref >60)
Glucose, Bld: 200 mg/dL — ABNORMAL HIGH (ref 70–99)
Potassium: 3.5 mmol/L (ref 3.5–5.1)
Sodium: 142 mmol/L (ref 135–145)

## 2018-10-21 LAB — CARBON MONOXIDE, BLOOD (PERFORMED AT REF LAB): Carbon Monoxide, Blood: 3.6 % (ref 0.0–3.6)

## 2018-10-21 LAB — ECHOCARDIOGRAM COMPLETE
Height: 66 in
Weight: 2825.42 oz

## 2018-10-21 LAB — CBC
HCT: 36.5 % (ref 36.0–46.0)
Hemoglobin: 11.7 g/dL — ABNORMAL LOW (ref 12.0–15.0)
MCH: 30.8 pg (ref 26.0–34.0)
MCHC: 32.1 g/dL (ref 30.0–36.0)
MCV: 96.1 fL (ref 80.0–100.0)
Platelets: 171 10*3/uL (ref 150–400)
RBC: 3.8 MIL/uL — ABNORMAL LOW (ref 3.87–5.11)
RDW: 13.7 % (ref 11.5–15.5)
WBC: 8.7 10*3/uL (ref 4.0–10.5)
nRBC: 0 % (ref 0.0–0.2)

## 2018-10-21 MED ORDER — HYDROCOD POLST-CPM POLST ER 10-8 MG/5ML PO SUER
5.0000 mL | Freq: Two times a day (BID) | ORAL | Status: DC
  Administered 2018-10-21 – 2018-10-22 (×3): 5 mL via ORAL
  Filled 2018-10-21 (×5): qty 5

## 2018-10-21 MED ORDER — METHYLPREDNISOLONE SODIUM SUCC 125 MG IJ SOLR
60.0000 mg | INTRAMUSCULAR | Status: DC
  Administered 2018-10-22: 60 mg via INTRAVENOUS
  Filled 2018-10-21: qty 2

## 2018-10-21 MED ORDER — BENZONATATE 100 MG PO CAPS
200.0000 mg | ORAL_CAPSULE | Freq: Three times a day (TID) | ORAL | Status: DC
  Administered 2018-10-21 – 2018-10-22 (×4): 200 mg via ORAL
  Filled 2018-10-21 (×6): qty 2

## 2018-10-21 MED ORDER — AZITHROMYCIN 250 MG PO TABS
500.0000 mg | ORAL_TABLET | Freq: Every day | ORAL | Status: DC
  Administered 2018-10-22 – 2018-10-23 (×2): 500 mg via ORAL
  Filled 2018-10-21 (×2): qty 2

## 2018-10-21 NOTE — Progress Notes (Signed)
PT Cancellation Note  Patient Details Name: JOYANNE EDDINGER MRN: [id number] DOB: 1934/11/04   Cancelled Treatment:    Reason Eval/Treat Not Completed: Other (comment)(Patient consult received and reviewed. Patient eating lunch and on phone at time of attempt. Agreeable to participate in evaluation at a later time. Will attempt again at a later time/date.)   Janna Arch, PT, DPT   10/21/2018, 1:13 PM

## 2018-10-21 NOTE — Progress Notes (Signed)
*  PRELIMINARY RESULTS* Echocardiogram 2D Echocardiogram has been performed.  Raven [name] 10/21/2018, 1:07 PM

## 2018-10-21 NOTE — Progress Notes (Signed)
[place] [facility] [place] NAME: Raven [name]    MR#:  [id number]  DATE OF BIRTH:  Jul 12, 1934  SUBJECTIVE:  CHIEF COMPLAINT:   Chief Complaint  Patient presents with  . Shortness of Breath   -Fevers and cough prior to admission.  Afebrile this morning.  Still has significant dry cough.  Breathing is improving.  On 2 L home oxygen, currently requiring 3 L  REVIEW OF SYSTEMS:  Review of Systems  Constitutional: Positive for fever and malaise/fatigue. Negative for chills.  HENT: Negative for congestion, ear discharge, hearing loss and nosebleeds.   Eyes: Negative for blurred vision and double vision.  Respiratory: Positive for cough and shortness of breath. Negative for wheezing.   Cardiovascular: Negative for chest pain, palpitations and leg swelling.  Gastrointestinal: Negative for abdominal pain, constipation, diarrhea, nausea and vomiting.  Genitourinary: Negative for dysuria.  Musculoskeletal: Negative for myalgias.  Neurological: Negative for dizziness, focal weakness, seizures, weakness and headaches.  Psychiatric/Behavioral: Negative for depression.    DRUG ALLERGIES:   Allergies  Allergen Reactions  . Penicillins Other (See Comments)    Reaction:  Unknown  Has patient had a PCN reaction causing immediate rash, facial/tongue/throat swelling, SOB or lightheadedness with hypotension: unknown Has patient had a PCN reaction causing severe rash involving mucus membranes or skin necrosis: unknown Has patient had a PCN reaction that required hospitalization No Has patient had a PCN reaction occurring within the last 10 years: No If all of the above answers are "NO", then may proceed with Cephalosporin use.   . Augmentin [Amoxicillin-Pot Clavulanate] Other (See Comments)    Reaction:  Unknown   . Indocin [Indomethacin] Other (See Comments)    Reaction:  Unknown   . Lodine [Etodolac] Other (See Comments)    Reaction:   GI upset   .  Methotrexate Derivatives Other (See Comments)    Reaction:  GI upset   . Gold Rash  . Zantac [Ranitidine Hcl] Rash    VITALS:  Blood pressure (!) 146/76, pulse 85, temperature 97.7 F (36.5 C), temperature source Oral, resp. rate 18, height 5' 6"  (1.676 m), weight 80.1 kg, SpO2 97 %.  PHYSICAL EXAMINATION:  Physical Exam   GENERAL:  84 y.o.-year-old patient lying in the bed with no acute distress.  EYES: Pupils equal, round, reactive to light and accommodation. No scleral icterus. Extraocular muscles intact.  HEENT: Head atraumatic, normocephalic. Oropharynx and nasopharynx clear.  NECK:  Supple, no jugular venous distention. No thyroid enlargement, no tenderness.  LUNGS: Scant breath sounds bilaterally, no wheezing, rales,rhonchi or crepitation. No use of accessory muscles of respiration.  CARDIOVASCULAR: S1, S2 normal. No rubs, or gallops.  2/6 systolic murmur present ABDOMEN: Soft, nontender, nondistended. Bowel sounds present. No organomegaly or mass.  EXTREMITIES: No pedal edema, cyanosis, or clubbing.  NEUROLOGIC: Cranial nerves II through XII are intact. Muscle strength 5/5 in all extremities. Sensation intact. Gait not checked.  PSYCHIATRIC: The patient is alert and oriented x 3.  SKIN: No obvious rash, lesion, or ulcer.    LABORATORY PANEL:   CBC Recent Labs  Lab 10/21/18 0704  WBC 8.7  HGB 11.7*  HCT 36.5  PLT 171   ------------------------------------------------------------------------------------------------------------------  Chemistries  Recent Labs  Lab 10/20/18 1205 10/21/18 0704  NA 140 142  K 3.6 3.5  CL 107 105  CO2 24 25  GLUCOSE 99 200*  BUN 14 17  CREATININE 0.77 0.89  CALCIUM 8.2* 7.6*  AST 20  --  ALT 24  --   ALKPHOS 66  --   BILITOT 0.7  --    ------------------------------------------------------------------------------------------------------------------  Cardiac Enzymes No results for input(s): TROPONINI in the last 168 hours.  ------------------------------------------------------------------------------------------------------------------  RADIOLOGY:  Dg Chest 2 View  Result Date: 10/21/2018 CLINICAL DATA:  Fever EXAM: CHEST - 2 VIEW COMPARISON:  Yesterday FINDINGS: Elevated right diaphragm. Increased opacification at the right lung base with streaky atelectatic type opacity on the lateral view. No edema, effusion, or pneumothorax. Borderline heart size which is accentuated by fat about the apex. IMPRESSION: Increased atelectasis or airspace disease about the chronically elevated right diaphragm. Electronically Signed   By: Monte Fantasia M.D.   On: 10/21/2018 06:55   Dg Chest Port 1 View  Result Date: 10/20/2018 CLINICAL DATA:  Acute shortness of breath, fever and cough EXAM: PORTABLE CHEST 1 VIEW COMPARISON:  04/23/2016 and prior exams FINDINGS: The cardiomediastinal silhouette is unremarkable. Pulmonary vascular congestion noted. Elevated RIGHT hemidiaphragm again noted with minimal bibasilar atelectasis. There is no evidence of focal airspace disease, pulmonary edema, suspicious pulmonary nodule/mass, pleural effusion, or pneumothorax. No acute bony abnormalities are identified. IMPRESSION: Pulmonary vascular congestion and minimal bibasilar atelectasis. Electronically Signed   By: Margarette Canada M.D.   On: 10/20/2018 12:56    EKG:   Orders placed or performed during the hospital encounter of 10/20/18  . EKG 12-Lead  . EKG 12-Lead  . ED EKG 12-Lead  . ED EKG 12-Lead  . EKG 12-Lead  . EKG 12-Lead    ASSESSMENT AND PLAN:   84 year old female with past medical history significant for COPD on 2 L home oxygen, CHF, hypertension, GERD, anemia and arthritis presents to hospital secondary to worsening shortness of breath and cough.  1.  Acute on chronic hypoxic respiratory failure-secondary to COPD exacerbation and acute bronchitis -Improving today.  On 3 L oxygen.  Wean to 2 L as tolerated -Changed to daily  steroids as improving, continue antibiotics with Rocephin and azithromycin. -Chest x-ray with chronic right basilar infiltrate and elevated hemidiaphragm. -Continue inhalers and nebulizer treatments  2.  Acute on chronic systolic CHF-repeat echocardiogram.  On IV Lasix. -Follow-up BNP in a.m.  3.  Hypertension-patient on Norvasc, benazepril, and metoprolol  4.  DVT prophylaxis-Lovenox  Patient is independent at baseline Son updated at bedside. -PT consult is pending   All the records are reviewed and case discussed with Care Management/Social Workerr. Management plans discussed with the patient, family and they are in agreement.  CODE STATUS: Full code  TOTAL TIME TAKING CARE OF THIS PATIENT: 39 minutes.   POSSIBLE D/C IN 2 DAYS, DEPENDING ON CLINICAL CONDITION.   Gladstone Lighter M.D on 10/21/2018 at 2:02 PM  Between 7am to 6pm - Pager - [phone]  After 6pm go to [email or web address] - password [email or web address]  Office  [phone]  CC: Primary care physician; Baxter Hire, MD

## 2018-10-21 NOTE — Plan of Care (Signed)
  Problem: Education: Goal: Ability to verbalize understanding of medication therapies will improve Outcome: Progressing   Problem: Activity: Goal: Capacity to carry out activities will improve Outcome: Progressing

## 2018-10-21 NOTE — Evaluation (Signed)
Physical Therapy Evaluation Patient Details Name: Raven [name] MRN: [id number] DOB: 03-20-1934 Today's Date: 10/21/2018   History of Present Illness  Patient is a pleasant 84 year old female admitted for COPD exacerbation with temperature of 102.6 and tachycardia. PMH includes COPD, HTN, CAD, ischemic cardiomyopathy of EF 45%, 2L 02 at home, Crohn's disease, depression, HLD, MI, and osteopenia.  Clinical Impression  Patient is a pleasant 84 year old female who presents with diffuse LE weakness and limited dynamic stability secondary to fatigue. Patient's capacity for prolonged ambulation is limited however she maintained an Sp02>90% on 3L of 02 via nasal cannula. Patient will benefit from skilled physical therapy while in the hospital to progress functional mobility and strength. Patient is safe for short distance mobility and household ambulation however will benefit from outpatient physical therapy upon discharge to improve dynamic stability and LE strength for community mobility and decreased fall risk.     Follow Up Recommendations Outpatient PT    Equipment Recommendations  None recommended by PT    Recommendations for Other Services       Precautions / Restrictions        Mobility  Bed Mobility Overal bed mobility: Independent             General bed mobility comments: Patient independent without use of railing Supine to sit  Transfers Overall transfer level: Modified independent               General transfer comment: sit to stand without AD with extra time, uses hands on side of bed.  Ambulation/Gait Ambulation/Gait assistance: Supervision;Min guard Gait Distance (Feet): 160 Feet Assistive device: None   Gait velocity: .71 m/s   General Gait Details: Slight drift to L side, occasional stumble with head turn, three standing rest breaks where patient touched support surface and/or PT for stabilization due to fatigue. narrow BOS with decreased weight  acceptance onto LLE.  Stairs            Wheelchair Mobility    Modified Rankin (Stroke Patients Only)       Balance Overall balance assessment: Mild deficits observed, not formally tested Sitting-balance support: No upper extremity supported;Feet supported Sitting balance-Leahy Scale: Good Sitting balance - Comments: static seated balance is functional with reach inside and outside BOS, slightly more challenged with LE mobility.   Standing balance support: No upper extremity supported;During functional activity Standing balance-Leahy Scale: Fair Standing balance comment: Patient is able to static stand without LOB however is limited in dynamic stability with distraction and deterioration of balance with fatigue.               High Level Balance Comments: stumbles with horizontal head turns.             Pertinent Vitals/Pain Pain Assessment: No/denies pain    Home Living Family/patient expects to be discharged to:: Private residence Living Arrangements: Alone Available Help at Discharge: Family Type of Home: House Home Access: Stairs to enter Entrance Stairs-Rails: Right(R side going up in the back (primarily used enterance)) Entrance Stairs-Number of Steps: 3 Home Layout: One level Home Equipment: Cane - single point;Other (comment)(Patient reports she doesn't use any AD's) Additional Comments: Patient reports she does not use any AD at home. Only uses 2L of 02 via nasal cannula. Has a son in the area but lives alone.    Prior Function Level of Independence: Independent         Comments: Per patient she is independent with all ADLs  and iADLs. Has not been out as much lately due to [place]. Reports she drives and prior to COVID was going to church/active in the community. Feels like she has lost balance/become more weak since COVID began.     Hand Dominance        Extremity/Trunk Assessment   Upper Extremity Assessment Upper Extremity Assessment:  Generalized weakness    Lower Extremity Assessment Lower Extremity Assessment: RLE deficits/detail;LLE deficits/detail RLE Deficits / Details: 3+/5 RLE Sensation: WNL RLE Coordination: WNL LLE Deficits / Details: grossly 3/5 LLE Sensation: WNL LLE Coordination: decreased gross motor       Communication   Communication: No difficulties  Cognition Arousal/Alertness: Awake/alert Behavior During Therapy: WFL for tasks assessed/performed                                   General Comments: Patient is pleasant and oriented.      General Comments General comments (skin integrity, edema, etc.): limited muscle mass of LE's    Exercises Other Exercises Other Exercises: patient educated on safe transfers and mobility for reduced fall risk.   Assessment/Plan    PT Assessment Patient needs continued PT services  PT Problem List Decreased strength;Decreased activity tolerance;Decreased balance       PT Treatment Interventions Gait training;Stair training;Functional mobility training;Therapeutic activities;Therapeutic exercise;Balance training;Neuromuscular re-education;Patient/family education    PT Goals (Current goals can be found in the Care Plan section)  Acute Rehab PT Goals Patient Stated Goal: to return home and get stronger PT Goal Formulation: With patient Time For Goal Achievement: 11/04/18 Potential to Achieve Goals: Good    Frequency Min 2X/week   Barriers to discharge   would benefit from outpatient PT    Co-evaluation               AM-PAC PT "6 Clicks" Mobility  Outcome Measure Help needed turning from your back to your side while in a flat bed without using bedrails?: None Help needed moving from lying on your back to sitting on the side of a flat bed without using bedrails?: A Little Help needed moving to and from a bed to a chair (including a wheelchair)?: None Help needed standing up from a chair using your arms (e.g., wheelchair or  bedside chair)?: None Help needed to walk in hospital room?: A Little Help needed climbing 3-5 steps with a railing? : A Little 6 Click Score: 21    End of Session Equipment Utilized During Treatment: Gait belt;Oxygen(2L static, 3L with ambulation) Activity Tolerance: Patient limited by fatigue;Patient tolerated treatment well Patient left: in chair;with call bell/phone within reach;Other (comment)(with social work) Marine scientist Communication: Mobility status PT Visit Diagnosis: Unsteadiness on feet (R26.81);Other abnormalities of gait and mobility (R26.89);Muscle weakness (generalized) (M62.81);Difficulty in walking, not elsewhere classified (R26.2)    Time: 9937-1696 PT Time Calculation (min) (ACUTE ONLY): 17 min   Charges:   PT Evaluation $PT Eval Low Complexity: 1 Low PT Treatments $Gait Training: 8-22 mins        Janna Arch, PT, DPT    Janna Arch 10/21/2018, 2:46 PM

## 2018-10-22 LAB — HEMOGLOBIN A1C
Hgb A1c MFr Bld: 6.5 % — ABNORMAL HIGH (ref 4.8–5.6)
Mean Plasma Glucose: 139.85 mg/dL

## 2018-10-22 LAB — URINE CULTURE: Culture: NO GROWTH

## 2018-10-22 LAB — BASIC METABOLIC PANEL
Anion gap: 10 (ref 5–15)
BUN: 25 mg/dL — ABNORMAL HIGH (ref 8–23)
CO2: 27 mmol/L (ref 22–32)
Calcium: 7.4 mg/dL — ABNORMAL LOW (ref 8.9–10.3)
Chloride: 104 mmol/L (ref 98–111)
Creatinine, Ser: 0.99 mg/dL (ref 0.44–1.00)
GFR calc Af Amer: >60 mL/min (ref >60)
GFR calc non Af Amer: 53 mL/min — ABNORMAL LOW (ref >60)
Glucose, Bld: 171 mg/dL — ABNORMAL HIGH (ref 70–99)
Potassium: 4.4 mmol/L (ref 3.5–5.1)
Sodium: 141 mmol/L (ref 135–145)

## 2018-10-22 MED ORDER — FUROSEMIDE 20 MG PO TABS
20.0000 mg | ORAL_TABLET | Freq: Every day | ORAL | Status: DC
  Administered 2018-10-23: 20 mg via ORAL
  Filled 2018-10-22: qty 1

## 2018-10-22 MED ORDER — ENOXAPARIN SODIUM 40 MG/0.4ML ~~LOC~~ SOLN
40.0000 mg | SUBCUTANEOUS | Status: DC
  Filled 2018-10-22: qty 0.4

## 2018-10-22 MED ORDER — TRAMADOL HCL 50 MG PO TABS
50.0000 mg | ORAL_TABLET | Freq: Two times a day (BID) | ORAL | Status: DC
  Administered 2018-10-23: 50 mg via ORAL
  Filled 2018-10-22 (×3): qty 1

## 2018-10-22 MED ORDER — ALUM & MAG HYDROXIDE-SIMETH 200-200-20 MG/5ML PO SUSP
15.0000 mL | ORAL | Status: DC | PRN
  Administered 2018-10-22 – 2018-10-23 (×2): 15 mL via ORAL
  Filled 2018-10-22 (×2): qty 30

## 2018-10-22 NOTE — Progress Notes (Signed)
This RN called by central tele.  Noted that she had a run of SVT.  This RN at patient bedside. Patient without complaints. Dr. Tressia Miners made aware. No new orders received.

## 2018-10-22 NOTE — Plan of Care (Signed)
  Problem: Clinical Measurements: Goal: Cardiovascular complication will be avoided Outcome: Progressing   Problem: Activity: Goal: Risk for activity intolerance will decrease Outcome: Progressing   Problem: Pain Managment: Goal: General experience of comfort will improve Outcome: Progressing   Problem: Safety: Goal: Ability to remain free from injury will improve Outcome: Progressing   Problem: Education: Goal: Ability to verbalize understanding of medication therapies will improve Outcome: Progressing   Problem: Activity: Goal: Capacity to carry out activities will improve Outcome: Progressing   Problem: Cardiac: Goal: Ability to achieve and maintain adequate cardiopulmonary perfusion will improve Outcome: Progressing

## 2018-10-22 NOTE — Progress Notes (Signed)
[facility] [place] NAME: Raven [name]    MR#:  [id number]  DATE OF BIRTH:  12-May-1934  SUBJECTIVE:  CHIEF COMPLAINT:   Chief Complaint  Patient presents with   Shortness of Breath   -No further fevers since yesterday.  Still complains of dry cough which is chronic -Dyspnea is improving.  SVT once on monitor today while patient was asymptomatic  REVIEW OF SYSTEMS:  Review of Systems  Constitutional: Positive for malaise/fatigue. Negative for chills and fever.  HENT: Negative for congestion, ear discharge, hearing loss and nosebleeds.   Eyes: Negative for blurred vision and double vision.  Respiratory: Positive for cough and shortness of breath. Negative for wheezing.   Cardiovascular: Negative for chest pain, palpitations and leg swelling.  Gastrointestinal: Negative for abdominal pain, constipation, diarrhea, nausea and vomiting.  Genitourinary: Negative for dysuria.  Musculoskeletal: Negative for myalgias.  Neurological: Negative for dizziness, focal weakness, seizures, weakness and headaches.  Psychiatric/Behavioral: Negative for depression.    DRUG ALLERGIES:   Allergies  Allergen Reactions   Penicillins Other (See Comments)    Reaction:  Unknown  Has patient had a PCN reaction causing immediate rash, facial/tongue/throat swelling, SOB or lightheadedness with hypotension: unknown Has patient had a PCN reaction causing severe rash involving mucus membranes or skin necrosis: unknown Has patient had a PCN reaction that required hospitalization No Has patient had a PCN reaction occurring within the last 10 years: No If all of the above answers are "NO", then may proceed with Cephalosporin use.    Augmentin [Amoxicillin-Pot Clavulanate] Other (See Comments)    Reaction:  Unknown    Indocin [Indomethacin] Other (See Comments)    Reaction:  Unknown    Lodine [Etodolac] Other (See Comments)    Reaction:   GI upset     Methotrexate Derivatives Other (See Comments)    Reaction:  GI upset    Gold Rash   Zantac [Ranitidine Hcl] Rash    VITALS:  Blood pressure 133/69, pulse 77, temperature (!) 97.5 F (36.4 C), temperature source Oral, resp. rate 19, height 5' 6"  (1.676 m), weight 77.1 kg, SpO2 92 %.  PHYSICAL EXAMINATION:  Physical Exam   GENERAL:  84 y.o.-year-old patient lying in the bed with no acute distress.  EYES: Pupils equal, round, reactive to light and accommodation. No scleral icterus. Extraocular muscles intact.  HEENT: Head atraumatic, normocephalic. Oropharynx and nasopharynx clear.  NECK:  Supple, no jugular venous distention. No thyroid enlargement, no tenderness.  LUNGS: Improved breath sounds bilaterally, bibasilar rhonchi.  No wheezing, rales or crepitation. No use of accessory muscles of respiration.  CARDIOVASCULAR: S1, S2 normal. No rubs, or gallops.  2/6 systolic murmur present ABDOMEN: Soft, nontender, nondistended. Bowel sounds present. No organomegaly or mass.  EXTREMITIES: No pedal edema, cyanosis, or clubbing.  NEUROLOGIC: Cranial nerves II through XII are intact. Muscle strength 5/5 in all extremities. Sensation intact. Gait not checked.  PSYCHIATRIC: The patient is alert and oriented x 3.  SKIN: No obvious rash, lesion, or ulcer.    LABORATORY PANEL:   CBC Recent Labs  Lab 10/21/18 0704  WBC 8.7  HGB 11.7*  HCT 36.5  PLT 171   ------------------------------------------------------------------------------------------------------------------  Chemistries  Recent Labs  Lab 10/20/18 1205  10/22/18 0521  NA 140   < > 141  K 3.6   < > 4.4  CL 107   < > 104  CO2 24   < > 27  GLUCOSE 99   < >  171*  BUN 14   < > 25*  CREATININE 0.77   < > 0.99  CALCIUM 8.2*   < > 7.4*  AST 20  --   --   ALT 24  --   --   ALKPHOS 66  --   --   BILITOT 0.7  --   --    < > = values in this interval not displayed.    ------------------------------------------------------------------------------------------------------------------  Cardiac Enzymes No results for input(s): TROPONINI in the last 168 hours. ------------------------------------------------------------------------------------------------------------------  RADIOLOGY:  Dg Chest 2 View  Result Date: 10/21/2018 CLINICAL DATA:  Fever EXAM: CHEST - 2 VIEW COMPARISON:  Yesterday FINDINGS: Elevated right diaphragm. Increased opacification at the right lung base with streaky atelectatic type opacity on the lateral view. No edema, effusion, or pneumothorax. Borderline heart size which is accentuated by fat about the apex. IMPRESSION: Increased atelectasis or airspace disease about the chronically elevated right diaphragm. Electronically Signed   By: Monte Fantasia M.D.   On: 10/21/2018 06:55   Ct Chest Wo Contrast  Result Date: 10/21/2018 CLINICAL DATA:  Cough and fever prior to admission. Afebrile this morning. Continued significant dry cough. EXAM: CT CHEST WITHOUT CONTRAST TECHNIQUE: Multidetector CT imaging of the chest was performed following the standard protocol without IV contrast. COMPARISON:  06/19/2015 and chest radiographs obtained earlier today. Abdomen and pelvis CT dated 02/15/2015. FINDINGS: Cardiovascular: Atheromatous calcifications, including the coronary arteries and aorta. Mildly enlarged heart. Mediastinum/Nodes: Small bilateral thyroid nodules, the largest on the left, measuring 7 mm in maximum diameter. No enlarged lymph nodes. Unremarkable esophagus. Lungs/Pleura: Mild to moderate diffuse bullous changes with a centrilobular distribution. Mild to moderate diffuse peribronchial thickening. Stable chronically elevated right hemidiaphragm with adjacent right basilar atelectasis/scarring. No superimposed airspace opacity seen. 6 mm subpleural nodule in the right lower lobe, abutting the major fissure, with little change. No new lung  nodules are seen. Stable right apical pleural and parenchymal scarring. Upper Abdomen: Moderate to marked diffuse pancreatic atrophy with mild progression. Multiple colonic diverticula. Musculoskeletal: Thoracic and lower cervical spine degenerative changes. IMPRESSION: 1. No acute abnormality. 2. Mild-to-moderate changes of COPD and chronic bronchitis with centrilobular emphysema without significant change. 3. Stable chronically elevated right hemidiaphragm with adjacent right basilar atelectasis/scarring. 4. Calcific coronary artery and aortic atherosclerosis. Aortic Atherosclerosis (ICD10-I70.0) and Emphysema (ICD10-J43.9). Electronically Signed   By: Claudie Revering M.D.   On: 10/21/2018 15:32   Dg Chest Port 1 View  Result Date: 10/20/2018 CLINICAL DATA:  Acute shortness of breath, fever and cough EXAM: PORTABLE CHEST 1 VIEW COMPARISON:  04/23/2016 and prior exams FINDINGS: The cardiomediastinal silhouette is unremarkable. Pulmonary vascular congestion noted. Elevated RIGHT hemidiaphragm again noted with minimal bibasilar atelectasis. There is no evidence of focal airspace disease, pulmonary edema, suspicious pulmonary nodule/mass, pleural effusion, or pneumothorax. No acute bony abnormalities are identified. IMPRESSION: Pulmonary vascular congestion and minimal bibasilar atelectasis. Electronically Signed   By: Margarette Canada M.D.   On: 10/20/2018 12:56    EKG:   Orders placed or performed during the hospital encounter of 10/20/18   EKG 12-Lead   EKG 12-Lead   ED EKG 12-Lead   ED EKG 12-Lead   EKG 12-Lead   EKG 12-Lead    ASSESSMENT AND PLAN:   84 year old female with past medical history significant for COPD on 2 L home oxygen, CHF, hypertension, GERD, anemia and arthritis presents to hospital secondary to worsening shortness of breath and cough.  1.  Acute on chronic hypoxic respiratory failure-secondary to  COPD exacerbation and acute bronchitis -Improving now.  Patient uses 2 L oxygen  only at nighttime.  Currently on continuous O2.  Wean as tolerated and encourage ambulation. -Change IV Solu-Medrol to oral prednisone today.  Continue IV antibiotics today and change to oral antibiotics at discharge.  Patient on Rocephin and azithromycin. -Concern for carbon monoxide leak at home as well. -Chest x-ray with chronic right basilar infiltrate and elevated hemidiaphragm. -Continue inhalers and nebulizer treatments -Cough meds added.  CT chest showing no acute abnormality, has moderate changes of COPD and chronic bronchitis with centrilobular emphysema.  Right basilar atelectasis seen.  2.  Acute on chronic diastolic CHF- .  Much improved ejection fraction on echocardiogram, EF 50 to 34% with diastolic dysfunction noted. -Diuresed well. -Change Lasix to oral daily today  3.  Hypertension-patient on Norvasc, benazepril, and metoprolol  4.  DVT prophylaxis-Lovenox  Patient is independent at baseline Physical therapy recommended outpatient PT.  Anticipate discharge tomorrow   All the records are reviewed and case discussed with Care Management/Social Workerr. Management plans discussed with the patient, family and they are in agreement.  CODE STATUS: Full code  TOTAL TIME TAKING CARE OF THIS PATIENT: 37 minutes.   POSSIBLE D/C tomorrow, DEPENDING ON CLINICAL CONDITION.   Gladstone Lighter M.D on 10/22/2018 at 9:53 AM  Between 7am to 6pm - Pager - [phone]  After 6pm go to [email or web address] - password [email or web address] [facility]  Office  [phone]  CC: Primary care physician; Baxter Hire, MD

## 2018-10-23 LAB — BASIC METABOLIC PANEL
Anion gap: 13 (ref 5–15)
BUN: 30 mg/dL — ABNORMAL HIGH (ref 8–23)
CO2: 26 mmol/L (ref 22–32)
Calcium: 7.2 mg/dL — ABNORMAL LOW (ref 8.9–10.3)
Chloride: 100 mmol/L (ref 98–111)
Creatinine, Ser: 1.03 mg/dL — ABNORMAL HIGH (ref 0.44–1.00)
GFR calc Af Amer: 59 mL/min — ABNORMAL LOW (ref >60)
GFR calc non Af Amer: 51 mL/min — ABNORMAL LOW (ref >60)
Glucose, Bld: 167 mg/dL — ABNORMAL HIGH (ref 70–99)
Potassium: 3.8 mmol/L (ref 3.5–5.1)
Sodium: 139 mmol/L (ref 135–145)

## 2018-10-23 MED ORDER — PREDNISONE 10 MG PO TABS: ORAL_TABLET | ORAL | 0 refills | Status: DC

## 2018-10-23 MED ORDER — CEFDINIR 300 MG PO CAPS: 300.0000 mg | ORAL_CAPSULE | Freq: Two times a day (BID) | ORAL | 0 refills | Status: DC

## 2018-10-23 MED ORDER — CEFDINIR 300 MG PO CAPS
300.0000 mg | ORAL_CAPSULE | Freq: Two times a day (BID) | ORAL | Status: DC
  Administered 2018-10-23: 300 mg via ORAL
  Filled 2018-10-23 (×2): qty 1

## 2018-10-23 MED ORDER — PREDNISONE 50 MG PO TABS
50.0000 mg | ORAL_TABLET | Freq: Every day | ORAL | Status: DC
  Administered 2018-10-23: 50 mg via ORAL
  Filled 2018-10-23: qty 1

## 2018-10-23 MED ORDER — FUROSEMIDE 20 MG PO TABS: 20.0000 mg | ORAL_TABLET | Freq: Every day | ORAL | 0 refills | Status: DC

## 2018-10-23 MED ORDER — AZITHROMYCIN 250 MG PO TABS: ORAL_TABLET | ORAL | 0 refills | Status: DC

## 2018-10-23 NOTE — Discharge Instructions (Addendum)
Pt advised to use oxygen at night time as before and prn during daytime if short of breath.     Antibiotic Medicine, Adult  Antibiotic medicines are used to treat infections caused by bacteria, such as strep throat and urinary tract infection (UTI). Antibiotic medicines will not work for viral illnesses, such as colds or the flu (influenza). They work by killing the bacteria that is making you sick. Antibiotics can also have serious side effects. It is important that you take antibiotic medicines safely and only when needed. When do I need to take antibiotics? Antibiotics are medicines that treat bacterial infections. You may need antibiotics for:  UTI.  Strep throat.  Meningitis. This infection affects the spinal cord and brain.  Bacterial sinusitis.  Serious lung infection. You may start antibiotics while your health care provider waits for test results to come back. Common tests may include throat, urine, blood, or mucus culture. Your health care provider may change or stop the antibiotic depending on your test results. When are antibiotics not needed? You do not need antibiotics for most common illnesses. These illnesses may be caused by a virus, not a bacteria. You do not need antibiotics for:  The common cold.  Influenza.  Sore throat.  Discolored mucus.  Bronchitis. Antibiotics are not always needed for all bacterial infections. Many of these infections clear up without antibiotic treatment. Do not ask for or take antibiotics when they are not necessary. How long should I take the antibiotic? You must take the entire prescription. Continue to take your antibiotic for as long as told by your health care provider. Do not stop taking it even if you start to feel better. If you stop taking it too soon:  You may start to feel sick again.  Your infection may become harder to treat.  Complications may develop. Each course of antibiotics needs a different amount of time to  work. Some antibiotic courses last only a few days. Some last about a week to 10 days. In some cases, you may need to take antibiotics for a few weeks to completely treat the infection. What if I miss a dose? Try not to miss any doses of medicine. If you miss a dose, call your health care provider or pharmacist for advice. Sometimes it is okay to take the missed dose as soon as possible. What are the risks of taking antibiotics? Most antibiotics can cause an infection called Clostridioides difficile (C. difficile or C. diff), which causes severe diarrhea. This infection happens when the antibiotics kill the healthy bacteria in your intestines. This allows C. diff to grow. The infection needs to be treated right away. Let your health care provider know if:  You have diarrhea while taking an antibiotic.  You have diarrhea after you stop taking an antibiotic. C. diff infection can start weeks after stopping the antibiotic. Taking an antibiotic also puts you at risk for getting a bacteria that does not respond to medicine (antibiotic-resistant infection) in the future. Antibiotics can cause bacteria to change so that if the antibiotic is taken again, the medicine is not able to kill the bacteria. These infections can be more serious and, in some cases, life-threatening. Do antibiotics affect birth control? Birth control pills may not work while you are on antibiotics. If you are taking birth control pills, continue taking them as usual and use a second form of birth control, such as a condom, to avoid unwanted pregnancy. Continue using the second form of birth control until  your health care provider says you can stop. What else should I know about taking antibiotics? It is important for you to take antibiotics exactly as told. Make sure that you:  Take the entire course of antibiotic that was prescribed. Do not stop taking your antibiotics even if your symptoms improve.  Take the correct amount of  medicine each day.  Ask your health care provider: ? How long to wait in between doses. ? If the antibiotic should be taken with food. ? If there are any foods, drinks, or medicines that you should avoid while taking the antibiotics. ? If there are any side effects you should be aware of.  Only use the antibiotics prescribed for you by your health care provider. Do not use antibiotics prescribed for someone else.  Drink a large glass of water along with the antibiotics.  Ask the pharmacist for a syringe, cup, or spoon that properly measures the antibiotics.  Throw away any leftover medicine. Contact a health care provider if:  Your symptoms get worse.  You have new joint pain or muscle aches that begin after starting the antibiotic. When should I seek immediate medical care?  You have signs of a serious allergic reaction to antibiotics. If you have signs of a severe allergic reaction, stop taking the antibiotic right away. Signs may include: ? Hives, which are raised, itchy, red bumps on the skin. ? Skin rash. ? Trouble breathing. ? A wheezing sound when you breathe. ? Swelling anywhere on your body. ? Feeling dizzy. ? Vomiting.  Your urine turns dark or becomes blood-colored.  Your skin turns yellow.  You bruise or bleed easily.  You have severe diarrhea and abdominal cramps.  You have a severe headache. Summary  Antibiotic medicines are used to treat infections caused by bacteria, such as strep throat and UTIs. It is important that you take antibiotic medicines only when needed.  Your health care provider may change or stop the antibiotic depending on your test results.  Most antibiotics can cause an infection called Clostridioides difficile (C. difficile or C. diff), which causes severe diarrhea. Let your health care provider know if you develop diarrhea while taking an antibiotic.  Take the entire course of antibiotic that was prescribed. This information is not  intended to replace advice given to you by your health care provider. Make sure you discuss any questions you have with your health care provider. Document Released: 11/06/2003 Document Revised: 08/24/2017 Document Reviewed: 02/25/2016 Elsevier Patient Education  2020 [employer].

## 2018-10-23 NOTE — Plan of Care (Signed)
  Problem: Clinical Measurements: Goal: Ability to maintain clinical measurements within normal limits will improve Outcome: Progressing   Problem: Activity: Goal: Risk for activity intolerance will decrease Outcome: Progressing   Problem: Pain Managment: Goal: General experience of comfort will improve Outcome: Progressing   Problem: Safety: Goal: Ability to remain free from injury will improve Outcome: Progressing   Problem: Activity: Goal: Capacity to carry out activities will improve Outcome: Progressing   Problem: Cardiac: Goal: Ability to achieve and maintain adequate cardiopulmonary perfusion will improve Outcome: Progressing   Problem: Activity: Goal: Ability to tolerate increased activity will improve Outcome: Progressing   Problem: Respiratory: Goal: Ability to maintain a clear airway will improve Outcome: Progressing Goal: Levels of oxygenation will improve Outcome: Progressing

## 2018-10-23 NOTE — Progress Notes (Signed)
Discharge instructions provided to pt.  All questions addressed.  Understanding verified through teach back.  Awaiting transportation home via POV.  

## 2018-10-23 NOTE — Plan of Care (Signed)
Pt ready for discharge home.   Problem: Education: Goal: Knowledge of General Education information will improve Description: Including pain rating scale, medication(s)/side effects and non-pharmacologic comfort measures Outcome: Completed/Met   Problem: Health Behavior/Discharge Planning: Goal: Ability to manage health-related needs will improve Outcome: Completed/Met   Problem: Clinical Measurements: Goal: Ability to maintain clinical measurements within normal limits will improve Outcome: Completed/Met Goal: Will remain free from infection Outcome: Completed/Met Goal: Diagnostic test results will improve Outcome: Completed/Met Goal: Respiratory complications will improve Outcome: Completed/Met Goal: Cardiovascular complication will be avoided Outcome: Completed/Met   Problem: Activity: Goal: Risk for activity intolerance will decrease Outcome: Completed/Met   Problem: Nutrition: Goal: Adequate nutrition will be maintained Outcome: Completed/Met   Problem: Coping: Goal: Level of anxiety will decrease Outcome: Completed/Met   Problem: Elimination: Goal: Will not experience complications related to bowel motility Outcome: Completed/Met Goal: Will not experience complications related to urinary retention Outcome: Completed/Met   Problem: Pain Managment: Goal: General experience of comfort will improve Outcome: Completed/Met   Problem: Safety: Goal: Ability to remain free from injury will improve Outcome: Completed/Met   Problem: Skin Integrity: Goal: Risk for impaired skin integrity will decrease Outcome: Completed/Met   Problem: Education: Goal: Ability to demonstrate management of disease process will improve Outcome: Completed/Met Goal: Ability to verbalize understanding of medication therapies will improve Outcome: Completed/Met Goal: Individualized Educational Video(s) Outcome: Completed/Met   Problem: Activity: Goal: Capacity to carry out activities  will improve Outcome: Completed/Met   Problem: Cardiac: Goal: Ability to achieve and maintain adequate cardiopulmonary perfusion will improve Outcome: Completed/Met   Problem: Education: Goal: Knowledge of disease or condition will improve Outcome: Completed/Met Goal: Knowledge of the prescribed therapeutic regimen will improve Outcome: Completed/Met Goal: Individualized Educational Video(s) Outcome: Completed/Met   Problem: Activity: Goal: Ability to tolerate increased activity will improve Outcome: Completed/Met Goal: Will verbalize the importance of balancing activity with adequate rest periods Outcome: Completed/Met   Problem: Respiratory: Goal: Ability to maintain a clear airway will improve Outcome: Completed/Met Goal: Levels of oxygenation will improve Outcome: Completed/Met Goal: Ability to maintain adequate ventilation will improve Outcome: Completed/Met   Problem: Acute Rehab PT Goals(only PT should resolve) Goal: Pt Will Perform Standing Balance Or Pre-Gait Outcome: Completed/Met Goal: Pt Will Ambulate Outcome: Completed/Met Goal: Pt Will Go Up/Down Stairs Outcome: Completed/Met

## 2018-10-23 NOTE — Discharge Summary (Signed)
[facility] NAME: Raven [name]    MR#:  [id number]  DATE OF BIRTH:  1934/06/09  DATE OF ADMISSION:  10/20/2018 ADMITTING PHYSICIAN: Hillary Bow, MD  DATE OF DISCHARGE: 10/23/2018  PRIMARY CARE PHYSICIAN: Baxter Hire, MD    ADMISSION DIAGNOSIS:  Fever [R50.9] Sepsis, due to unspecified organism, unspecified whether acute organ dysfunction present ([place]) [A41.9]  DISCHARGE DIAGNOSIS:   *Acute on chronic hypoxic respiratory failure-secondary to COPD exacerbation and acute bronchitis  *Acute on chronic diastolic CHF SECONDARY DIAGNOSIS:   Past Medical History:  Diagnosis Date  . Anemia   . Arthritis   . Asthma   . Complication of anesthesia   . COPD (chronic obstructive pulmonary disease) ([place])   . Crohn's disease ([place])   . Depression    after death of husband  . Hyperlipidemia   . Hypertension   . Myocardial infarction ([place])   . Osteopenia   . Peptic ulcer disease   . Pneumonia   . PONV (postoperative nausea and vomiting)     HOSPITAL COURSE:   84 year old female with past medical history significant for COPD on 2 L home oxygen, CHF, hypertension, GERD, anemia and arthritis presents to hospital secondary to worsening shortness of breath and cough.  1.  Acute on chronic hypoxic respiratory failure-secondary to COPD exacerbation and acute bronchitis -Improving now.  Patient uses 2 L oxygen only at nighttime.  Currently on continuous O2--sats >93% on RA -pt to use oxygen prn during daytime   -Change IV Solu-Medrol to oral prednisone today.   -  Patient on Rocephin and azithromycin-change to oral -Chest x-ray with chronic right basilar infiltrate and elevated hemidiaphragm. -Continue inhalers and nebulizer treatments -Cough meds added.  CT chest showing no acute abnormality, has moderate changes of COPD and chronic bronchitis with centrilobular emphysema.  Right basilar atelectasis seen.  2.  Acute on  chronic diastolic CHF- .  Much improved ejection fraction on echocardiogram, EF 50 to 17% with diastolic dysfunction noted. -Diuresed well. -Change Lasix to oral daily today -d/ced HCTZ  3.  Hypertension-patient on Norvasc, benazepril, and metoprolol  4.  DVT prophylaxis-Lovenox  Patient is independent at baseline Physical therapy recommended outpatient PT.   Pt agreeable for d/c today.  She is suppose to go to [facility] in few days till her house gets some work done. Pt will try get Outpt PT over there.  D/c today CONSULTS OBTAINED:    DRUG ALLERGIES:   Allergies  Allergen Reactions  . Penicillins Other (See Comments)    Reaction:  Unknown  Has patient had a PCN reaction causing immediate rash, facial/tongue/throat swelling, SOB or lightheadedness with hypotension: unknown Has patient had a PCN reaction causing severe rash involving mucus membranes or skin necrosis: unknown Has patient had a PCN reaction that required hospitalization No Has patient had a PCN reaction occurring within the last 10 years: No If all of the above answers are "NO", then may proceed with Cephalosporin use.   . Augmentin [Amoxicillin-Pot Clavulanate] Other (See Comments)    Reaction:  Unknown   . Indocin [Indomethacin] Other (See Comments)    Reaction:  Unknown   . Lodine [Etodolac] Other (See Comments)    Reaction:   GI upset   . Methotrexate Derivatives Other (See Comments)    Reaction:  GI upset   . Gold Rash  . Zantac [Ranitidine Hcl] Rash    DISCHARGE MEDICATIONS:   Allergies as of 10/23/2018  Reactions   Penicillins Other (See Comments)   Reaction:  Unknown  Has patient had a PCN reaction causing immediate rash, facial/tongue/throat swelling, SOB or lightheadedness with hypotension: unknown Has patient had a PCN reaction causing severe rash involving mucus membranes or skin necrosis: unknown Has patient had a PCN reaction that required hospitalization No Has patient had a PCN  reaction occurring within the last 10 years: No If all of the above answers are "NO", then may proceed with Cephalosporin use.   Augmentin [amoxicillin-pot Clavulanate] Other (See Comments)   Reaction:  Unknown    Indocin [indomethacin] Other (See Comments)   Reaction:  Unknown    Lodine [etodolac] Other (See Comments)   Reaction:   GI upset    Methotrexate Derivatives Other (See Comments)   Reaction:  GI upset    Gold Rash   Zantac [ranitidine Hcl] Rash      Medication List    STOP taking these medications   guaiFENesin-dextromethorphan 100-10 MG/5ML syrup Commonly known as: ROBITUSSIN DM   hydrochlorothiazide 25 MG tablet Commonly known as: HYDRODIURIL   levofloxacin 750 MG tablet Commonly known as: Levaquin   predniSONE 10 MG (21) Tbpk tablet Commonly known as: STERAPRED UNI-PAK 21 TAB Replaced by: predniSONE 10 MG tablet     TAKE these medications   albuterol 108 (90 Base) MCG/ACT inhaler Commonly known as: VENTOLIN HFA Inhale 2 puffs into the lungs every 6 (six) hours as needed for wheezing or shortness of breath.   ALLERGY PO Take 1 tablet by mouth daily.   amitriptyline 10 MG tablet Commonly known as: ELAVIL Take 20 mg by mouth at bedtime.   amLODipine-benazepril 5-20 MG capsule Commonly known as: LOTREL Take 1 capsule by mouth daily.   aspirin EC 81 MG tablet Take 81 mg by mouth daily.   atorvastatin 80 MG tablet Commonly known as: LIPITOR Take 80 mg by mouth at bedtime.   azithromycin 250 MG tablet Commonly known as: ZITHROMAX Take as directed   B-12 PO Take 1 tablet by mouth daily. Triple B-12   benzonatate 100 MG capsule Commonly known as: TESSALON Take by mouth 3 (three) times daily as needed for cough.   cefdinir 300 MG capsule Commonly known as: OMNICEF Take 1 capsule (300 mg total) by mouth every 12 (twelve) hours.   CITRACAL +D3 PO Take 1 tablet by mouth daily.   clopidogrel 75 MG tablet Commonly known as: PLAVIX Take 75 mg by  mouth daily.   Fluticasone-Salmeterol 250-50 MCG/DOSE Aepb Commonly known as: ADVAIR Inhale 1 puff into the lungs 2 (two) times daily.   furosemide 20 MG tablet Commonly known as: LASIX Take 1 tablet (20 mg total) by mouth daily.   Humira 40 MG/0.8ML Pskt Generic drug: Adalimumab Inject 40 mg into the skin every 14 (fourteen) days.   mesalamine 500 MG CR capsule Commonly known as: PENTASA Take 1,000 mg by mouth 2 (two) times daily.   metoprolol succinate 25 MG 24 hr tablet Commonly known as: TOPROL-XL Take 25 mg by mouth daily.   multivitamin with minerals Tabs tablet Take 1 tablet by mouth daily.   nitroGLYCERIN 0.4 MG SL tablet Commonly known as: NITROSTAT Place 0.4 mg under the tongue every 5 (five) minutes as needed for chest pain.   potassium chloride 10 MEQ tablet Commonly known as: K-DUR Take 10 mEq by mouth daily.   predniSONE 10 MG tablet Commonly known as: DELTASONE Take 50 mg daily--taper by 10 mg daily then stop Replaces: predniSONE 10 MG (  21) Tbpk tablet   risedronate 150 MG tablet Commonly known as: ACTONEL Take 150 mg by mouth every 30 (thirty) days. with water on empty stomach, nothing by mouth or lie down for next 30 minutes.   senna 8.6 MG tablet Commonly known as: SENOKOT Take 2 tablets by mouth at bedtime.   thiamine 100 MG tablet Commonly known as: VITAMIN B-1 Take 100 mg by mouth daily.   vitamin E 400 UNIT capsule Take 400 Units by mouth daily.       If you experience worsening of your admission symptoms, develop shortness of breath, life threatening emergency, suicidal or homicidal thoughts you must seek medical attention immediately by calling 911 or calling your MD immediately  if symptoms less severe.  You Must read complete instructions/literature along with all the possible adverse reactions/side effects for all the Medicines you take and that have been prescribed to you. Take any new Medicines after you have completely understood  and accept all the possible adverse reactions/side effects.   Please note  You were cared for by a hospitalist during your hospital stay. If you have any questions about your discharge medications or the care you received while you were in the hospital after you are discharged, you can call the unit and asked to speak with the hospitalist on call if the hospitalist that took care of you is not available. Once you are discharged, your primary care physician will handle any further medical issues. Please note that NO REFILLS for any discharge medications will be authorized once you are discharged, as it is imperative that you return to your primary care physician (or establish a relationship with a primary care physician if you do not have one) for your aftercare needs so that they can reassess your need for medications and monitor your lab values. Today   SUBJECTIVE   Overall feeling better  VITAL SIGNS:  Blood pressure (!) 142/68, pulse 83, temperature 98.5 F (36.9 C), temperature source Oral, resp. rate 19, height 5' 6"  (1.676 m), weight 77.5 kg, SpO2 100 %.  I/O:    Intake/Output Summary (Last 24 hours) at 10/23/2018 0751 Last data filed at 10/23/2018 0019 Gross per 24 hour  Intake 840 ml  Output 1000 ml  Net -160 ml    PHYSICAL EXAMINATION:  GENERAL:  84 y.o.-year-old patient lying in the bed with no acute distress.  EYES: Pupils equal, round, reactive to light and accommodation. No scleral icterus. Extraocular muscles intact.  HEENT: Head atraumatic, normocephalic. Oropharynx and nasopharynx clear.  NECK:  Supple, no jugular venous distention. No thyroid enlargement, no tenderness.  LUNGS: Normal breath sounds bilaterally, no wheezing, rales,rhonchi or crepitation. No use of accessory muscles of respiration.  CARDIOVASCULAR: S1, S2 normal. No murmurs, rubs, or gallops.  ABDOMEN: Soft, non-tender, non-distended. Bowel sounds present. No organomegaly or mass.  EXTREMITIES: No pedal  edema, cyanosis, or clubbing.  NEUROLOGIC: Cranial nerves II through XII are intact. Muscle strength 5/5 in all extremities. Sensation intact. Gait not checked.  PSYCHIATRIC: The patient is alert and oriented x 3.  SKIN: No obvious rash, lesion, or ulcer.   DATA REVIEW:   CBC  Recent Labs  Lab 10/21/18 0704  WBC 8.7  HGB 11.7*  HCT 36.5  PLT 171    Chemistries  Recent Labs  Lab 10/20/18 1205  10/23/18 0418  NA 140   < > 139  K 3.6   < > 3.8  CL 107   < > 100  CO2 24   < >  26  GLUCOSE 99   < > 167*  BUN 14   < > 30*  CREATININE 0.77   < > 1.03*  CALCIUM 8.2*   < > 7.2*  AST 20  --   --   ALT 24  --   --   ALKPHOS 66  --   --   BILITOT 0.7  --   --    < > = values in this interval not displayed.    Microbiology Results   Recent Results (from the past 240 hour(s))  Blood Culture (routine x 2)     Status: None (Preliminary result)   Collection Time: 10/20/18 12:05 PM   Specimen: BLOOD  Result Value Ref Range Status   Specimen Description BLOOD BLOOD LEFT FOREARM  Final   Special Requests   Final    BOTTLES DRAWN AEROBIC AND ANAEROBIC Blood Culture adequate volume   Culture   Final    NO GROWTH 3 DAYS Performed at [facility], [address], [place], [place] [address]    Report Status PENDING  Incomplete  Blood Culture (routine x 2)     Status: None (Preliminary result)   Collection Time: 10/20/18 12:06 PM   Specimen: BLOOD  Result Value Ref Range Status   Specimen Description BLOOD BLOOD LEFT ARM  Final   Special Requests   Final    BOTTLES DRAWN AEROBIC AND ANAEROBIC Blood Culture adequate volume   Culture   Final    NO GROWTH 3 DAYS Performed at [facility], [address] [place], [place] [address]    Report Status PENDING  Incomplete  Urine culture     Status: None   Collection Time: 10/20/18 12:06 PM   Specimen: In/Out Cath Urine  Result Value Ref Range Status   Specimen Description   Final    IN/OUT CATH URINE Performed  at [facility], [address], [place], [place] [address]    Special Requests   Final    NONE Performed at [facility], [address], [place], [place] [address]    Culture   Final    NO GROWTH Performed at [facility], [place] [address] [place], [place] [address]    Report Status 10/22/2018 FINAL  Final  SARS Coronavirus 2 [facility] order, Performed in [facility] hospital lab) Nasopharyngeal Nasopharyngeal Swab     Status: None   Collection Time: 10/20/18 12:06 PM   Specimen: Nasopharyngeal Swab  Result Value Ref Range Status   SARS Coronavirus 2 NEGATIVE NEGATIVE Final    Comment: (NOTE) If result is NEGATIVE SARS-CoV-2 target nucleic acids are NOT DETECTED. The SARS-CoV-2 RNA is generally detectable in upper and lower  respiratory specimens during the acute phase of infection. The lowest  concentration of SARS-CoV-2 viral copies this assay can detect is 250  copies / mL. A negative result does not preclude SARS-CoV-2 infection  and should not be used as the sole basis for treatment or other  patient management decisions.  A negative result may occur with  improper specimen collection / handling, submission of specimen other  than nasopharyngeal swab, presence of viral mutation(s) within the  areas targeted by this assay, and inadequate number of viral copies  (<250 copies / mL). A negative result must be combined with clinical  observations, patient history, and epidemiological information. If result is POSITIVE SARS-CoV-2 target nucleic acids are DETECTED. The SARS-CoV-2 RNA is generally detectable in upper and lower  respiratory specimens  dur ing the acute phase of infection.  Positive  results are indicative of active infection with SARS-CoV-2.  Clinical  correlation with patient history and other diagnostic information is  necessary to determine patient infection status.  Positive results do  not rule out bacterial infection or co-infection  with other viruses. If result is PRESUMPTIVE POSTIVE SARS-CoV-2 nucleic acids MAY BE PRESENT.   A presumptive positive result was obtained on the submitted specimen  and confirmed on repeat testing.  While 2019 novel coronavirus  (SARS-CoV-2) nucleic acids may be present in the submitted sample  additional confirmatory testing may be necessary for epidemiological  and / or clinical management purposes  to differentiate between  SARS-CoV-2 and other Sarbecovirus currently known to infect humans.  If clinically indicated additional testing with an alternate test  methodology [phone]) is advised. The SARS-CoV-2 RNA is generally  detectable in upper and lower respiratory sp ecimens during the acute  phase of infection. The expected result is Negative. Fact Sheet for Patients:  StrictlyIdeas.no Fact Sheet for Healthcare Providers: BankingDealers.co.za This test is not yet approved or cleared by the [place] FDA and has been authorized for detection and/or diagnosis of SARS-CoV-2 by FDA under an Emergency Use Authorization (EUA).  This EUA will remain in effect (meaning this test can be used) for the duration of the COVID-19 declaration under Section 564(b)(1) of the Act, 21 U.S.C. section 360bbb-3(b)(1), unless the authorization is terminated or revoked sooner. Performed at [facility], [address] [place], [place] [address]     RADIOLOGY:  Ct Chest Wo Contrast  Result Date: 10/21/2018 CLINICAL DATA:  Cough and fever prior to admission. Afebrile this morning. Continued significant dry cough. EXAM: CT CHEST WITHOUT CONTRAST TECHNIQUE: Multidetector CT imaging of the chest was performed following the standard protocol without IV contrast. COMPARISON:  06/19/2015 and chest radiographs obtained earlier today. Abdomen and pelvis CT dated 02/15/2015. FINDINGS: Cardiovascular: Atheromatous calcifications, including the coronary  arteries and aorta. Mildly enlarged heart. Mediastinum/Nodes: Small bilateral thyroid nodules, the largest on the left, measuring 7 mm in maximum diameter. No enlarged lymph nodes. Unremarkable esophagus. Lungs/Pleura: Mild to moderate diffuse bullous changes with a centrilobular distribution. Mild to moderate diffuse peribronchial thickening. Stable chronically elevated right hemidiaphragm with adjacent right basilar atelectasis/scarring. No superimposed airspace opacity seen. 6 mm subpleural nodule in the right lower lobe, abutting the major fissure, with little change. No new lung nodules are seen. Stable right apical pleural and parenchymal scarring. Upper Abdomen: Moderate to marked diffuse pancreatic atrophy with mild progression. Multiple colonic diverticula. Musculoskeletal: Thoracic and lower cervical spine degenerative changes. IMPRESSION: 1. No acute abnormality. 2. Mild-to-moderate changes of COPD and chronic bronchitis with centrilobular emphysema without significant change. 3. Stable chronically elevated right hemidiaphragm with adjacent right basilar atelectasis/scarring. 4. Calcific coronary artery and aortic atherosclerosis. Aortic Atherosclerosis (ICD10-I70.0) and Emphysema (ICD10-J43.9). Electronically Signed   By: Claudie Revering M.D.   On: 10/21/2018 15:32     CODE STATUS:     Code Status Orders  (From admission, onward)         Start     Ordered   10/20/18 1459  Do not attempt resuscitation (DNR)  Continuous    Question Answer Comment  In the event of cardiac or respiratory ARREST Do not call a "code blue"   In the event of cardiac or respiratory ARREST Do not perform Intubation, CPR, defibrillation or ACLS   In the event of cardiac or respiratory ARREST Use medication by any  route, position, wound care, and other measures to relive pain and suffering. May use oxygen, suction and manual treatment of airway obstruction as needed for comfort.      10/20/18 1500        Code  Status History    Date Active Date Inactive Code Status Order ID Comments User Context   04/22/2016 0945 04/24/2016 1909 Full Code [id number]  Bettey Costa, MD Inpatient   04/22/2016 0932 04/22/2016 0945 DNR [id number]  Bettey Costa, MD Inpatient   04/17/2016 1853 04/20/2016 1352 Full Code [id number]  Vaughan Basta, MD Inpatient   04/19/2015 2024 04/21/2015 1714 Full Code [id number]  Dustin Flock, MD Inpatient   11/02/2014 1202 11/08/2014 1741 Full Code [id number]  Bettey Costa, MD Inpatient   Advance Care Planning Activity    Advance Directive Documentation     Most Recent Value  Type of Advance Directive  Healthcare Power of [place], Living will  Pre-existing out of facility DNR order (yellow form or pink MOST form)  -  "MOST" Form in Place?  -      TOTAL TIME TAKING CARE OF THIS PATIENT: *40* minutes.    Fritzi Mandes M.D on 10/23/2018 at 7:51 AM  Between 7am to 6pm - Pager - [phone] After 6pm go to [email or web address] - password [email or web address] [facility]  Office  [phone]  CC: Primary care physician; Baxter Hire, MD

## 2018-10-25 LAB — CULTURE, BLOOD (ROUTINE X 2)
Culture: NO GROWTH
Culture: NO GROWTH
Special Requests: ADEQUATE
Special Requests: ADEQUATE

## 2019-06-17 ENCOUNTER — Encounter: Payer: Self-pay | Admitting: Emergency Medicine

## 2019-06-17 ENCOUNTER — Emergency Department: Payer: Medicare Other

## 2019-06-17 ENCOUNTER — Other Ambulatory Visit: Payer: Self-pay

## 2019-06-17 ENCOUNTER — Inpatient Hospital Stay
Admission: EM | Admit: 2019-06-17 | Discharge: 2019-06-21 | DRG: 386 | Disposition: A | Discharge status: Home / Self Care | Payer: Medicare Other | Attending: Student | Admitting: Student

## 2019-06-17 DIAGNOSIS — K5730 Diverticulosis of large intestine without perforation or abscess without bleeding: Secondary | ICD-10-CM | POA: Diagnosis present

## 2019-06-17 DIAGNOSIS — M459 Ankylosing spondylitis of unspecified sites in spine: Secondary | ICD-10-CM | POA: Diagnosis present

## 2019-06-17 DIAGNOSIS — Z66 Do not resuscitate: Secondary | ICD-10-CM | POA: Diagnosis present

## 2019-06-17 DIAGNOSIS — M8580 Other specified disorders of bone density and structure, unspecified site: Secondary | ICD-10-CM | POA: Diagnosis present

## 2019-06-17 DIAGNOSIS — I5032 Chronic diastolic (congestive) heart failure: Secondary | ICD-10-CM | POA: Diagnosis present

## 2019-06-17 DIAGNOSIS — I252 Old myocardial infarction: Secondary | ICD-10-CM | POA: Diagnosis not present

## 2019-06-17 DIAGNOSIS — K50012 Crohn's disease of small intestine with intestinal obstruction: Principal | ICD-10-CM | POA: Diagnosis present

## 2019-06-17 DIAGNOSIS — K635 Polyp of colon: Secondary | ICD-10-CM | POA: Diagnosis present

## 2019-06-17 DIAGNOSIS — Z880 Allergy status to penicillin: Secondary | ICD-10-CM

## 2019-06-17 DIAGNOSIS — Z7902 Long term (current) use of antithrombotics/antiplatelets: Secondary | ICD-10-CM | POA: Diagnosis not present

## 2019-06-17 DIAGNOSIS — Z978 Presence of other specified devices: Secondary | ICD-10-CM

## 2019-06-17 DIAGNOSIS — K279 Peptic ulcer, site unspecified, unspecified as acute or chronic, without hemorrhage or perforation: Secondary | ICD-10-CM | POA: Diagnosis present

## 2019-06-17 DIAGNOSIS — J449 Chronic obstructive pulmonary disease, unspecified: Secondary | ICD-10-CM | POA: Diagnosis present

## 2019-06-17 DIAGNOSIS — Z888 Allergy status to other drugs, medicaments and biological substances status: Secondary | ICD-10-CM

## 2019-06-17 DIAGNOSIS — Z20822 Contact with and (suspected) exposure to covid-19: Secondary | ICD-10-CM | POA: Diagnosis present

## 2019-06-17 DIAGNOSIS — J9611 Chronic respiratory failure with hypoxia: Secondary | ICD-10-CM | POA: Diagnosis present

## 2019-06-17 DIAGNOSIS — Z8249 Family history of ischemic heart disease and other diseases of the circulatory system: Secondary | ICD-10-CM

## 2019-06-17 DIAGNOSIS — Z7982 Long term (current) use of aspirin: Secondary | ICD-10-CM

## 2019-06-17 DIAGNOSIS — Z7952 Long term (current) use of systemic steroids: Secondary | ICD-10-CM | POA: Diagnosis not present

## 2019-06-17 DIAGNOSIS — Z9981 Dependence on supplemental oxygen: Secondary | ICD-10-CM

## 2019-06-17 DIAGNOSIS — E876 Hypokalemia: Secondary | ICD-10-CM | POA: Diagnosis present

## 2019-06-17 DIAGNOSIS — M1990 Unspecified osteoarthritis, unspecified site: Secondary | ICD-10-CM | POA: Diagnosis present

## 2019-06-17 DIAGNOSIS — M069 Rheumatoid arthritis, unspecified: Secondary | ICD-10-CM | POA: Diagnosis present

## 2019-06-17 DIAGNOSIS — I444 Left anterior fascicular block: Secondary | ICD-10-CM | POA: Diagnosis present

## 2019-06-17 DIAGNOSIS — J9621 Acute and chronic respiratory failure with hypoxia: Secondary | ICD-10-CM

## 2019-06-17 DIAGNOSIS — Z87891 Personal history of nicotine dependence: Secondary | ICD-10-CM

## 2019-06-17 DIAGNOSIS — Z79899 Other long term (current) drug therapy: Secondary | ICD-10-CM

## 2019-06-17 DIAGNOSIS — I2510 Atherosclerotic heart disease of native coronary artery without angina pectoris: Secondary | ICD-10-CM | POA: Diagnosis present

## 2019-06-17 DIAGNOSIS — K56609 Unspecified intestinal obstruction, unspecified as to partial versus complete obstruction: Secondary | ICD-10-CM | POA: Diagnosis present

## 2019-06-17 DIAGNOSIS — R1084 Generalized abdominal pain: Secondary | ICD-10-CM | POA: Diagnosis present

## 2019-06-17 DIAGNOSIS — E785 Hyperlipidemia, unspecified: Secondary | ICD-10-CM | POA: Diagnosis present

## 2019-06-17 DIAGNOSIS — R109 Unspecified abdominal pain: Secondary | ICD-10-CM

## 2019-06-17 DIAGNOSIS — I110 Hypertensive heart disease with heart failure: Secondary | ICD-10-CM | POA: Diagnosis present

## 2019-06-17 DIAGNOSIS — Z881 Allergy status to other antibiotic agents status: Secondary | ICD-10-CM

## 2019-06-17 LAB — CBC
HCT: 42.6 % (ref 36.0–46.0)
Hemoglobin: 14 g/dL (ref 12.0–15.0)
MCH: 30.8 pg (ref 26.0–34.0)
MCHC: 32.9 g/dL (ref 30.0–36.0)
MCV: 93.6 fL (ref 80.0–100.0)
Platelets: 215 10*3/uL (ref 150–400)
RBC: 4.55 MIL/uL (ref 3.87–5.11)
RDW: 13.8 % (ref 11.5–15.5)
WBC: 13.3 10*3/uL — ABNORMAL HIGH (ref 4.0–10.5)
nRBC: 0 % (ref 0.0–0.2)

## 2019-06-17 LAB — URINALYSIS, COMPLETE (UACMP) WITH MICROSCOPIC
Bacteria, UA: NONE SEEN
Bilirubin Urine: NEGATIVE
Glucose, UA: NEGATIVE mg/dL
Hgb urine dipstick: NEGATIVE
Ketones, ur: NEGATIVE mg/dL
Nitrite: NEGATIVE
Protein, ur: 100 mg/dL — AB
Specific Gravity, Urine: 1.02 (ref 1.005–1.030)
Squamous Epithelial / HPF: NONE SEEN (ref 0–5)
pH: 5 (ref 5.0–8.0)

## 2019-06-17 LAB — COMPREHENSIVE METABOLIC PANEL
ALT: 20 U/L (ref 0–44)
AST: 20 U/L (ref 15–41)
Albumin: 4.1 g/dL (ref 3.5–5.0)
Alkaline Phosphatase: 64 U/L (ref 38–126)
Anion gap: 13 (ref 5–15)
BUN: 23 mg/dL (ref 8–23)
CO2: 26 mmol/L (ref 22–32)
Calcium: 8.7 mg/dL — ABNORMAL LOW (ref 8.9–10.3)
Chloride: 103 mmol/L (ref 98–111)
Creatinine, Ser: 0.8 mg/dL (ref 0.44–1.00)
GFR calc Af Amer: >60 mL/min (ref >60)
GFR calc non Af Amer: >60 mL/min (ref >60)
Glucose, Bld: 161 mg/dL — ABNORMAL HIGH (ref 70–99)
Potassium: 2.9 mmol/L — ABNORMAL LOW (ref 3.5–5.1)
Sodium: 142 mmol/L (ref 135–145)
Total Bilirubin: 0.8 mg/dL (ref 0.3–1.2)
Total Protein: 6.4 g/dL — ABNORMAL LOW (ref 6.5–8.1)

## 2019-06-17 LAB — MAGNESIUM: Magnesium: 1.8 mg/dL (ref 1.7–2.4)

## 2019-06-17 LAB — LIPASE, BLOOD: Lipase: 22 U/L (ref 11–51)

## 2019-06-17 MED ORDER — ONDANSETRON HCL 4 MG/2ML IJ SOLN
4.0000 mg | Freq: Once | INTRAMUSCULAR | Status: AC
  Administered 2019-06-17: 16:00:00 4 mg via INTRAVENOUS
  Filled 2019-06-17: qty 2

## 2019-06-17 MED ORDER — ONDANSETRON HCL 4 MG/2ML IJ SOLN: 4.0000 mg | Freq: Four times a day (QID) | INTRAMUSCULAR | Status: DC | PRN

## 2019-06-17 MED ORDER — LACTATED RINGERS IV BOLUS
1000.0000 mL | Freq: Once | INTRAVENOUS | Status: AC
  Administered 2019-06-17: 1000 mL via INTRAVENOUS

## 2019-06-17 MED ORDER — POTASSIUM CHLORIDE 10 MEQ/100ML IV SOLN
10.0000 meq | INTRAVENOUS | Status: AC
  Administered 2019-06-17 (×2): 10 meq via INTRAVENOUS
  Filled 2019-06-17 (×2): qty 100

## 2019-06-17 MED ORDER — IOHEXOL 300 MG/ML  SOLN
100.0000 mL | Freq: Once | INTRAMUSCULAR | Status: AC | PRN
  Administered 2019-06-17: 17:00:00 100 mL via INTRAVENOUS

## 2019-06-17 MED ORDER — POTASSIUM CHLORIDE 10 MEQ/100ML IV SOLN
10.0000 meq | INTRAVENOUS | Status: AC
  Administered 2019-06-17: 10 meq via INTRAVENOUS
  Filled 2019-06-17 (×2): qty 100

## 2019-06-17 MED ORDER — HYDRALAZINE HCL 20 MG/ML IJ SOLN
10.0000 mg | Freq: Four times a day (QID) | INTRAMUSCULAR | Status: DC | PRN
  Administered 2019-06-20: 10 mg via INTRAVENOUS
  Filled 2019-06-17: qty 1

## 2019-06-17 MED ORDER — MORPHINE SULFATE (PF) 2 MG/ML IV SOLN
2.0000 mg | INTRAVENOUS | Status: DC | PRN
  Administered 2019-06-18 (×2): 2 mg via INTRAVENOUS
  Filled 2019-06-17 (×2): qty 1

## 2019-06-17 MED ORDER — IPRATROPIUM-ALBUTEROL 0.5-2.5 (3) MG/3ML IN SOLN: 3.0000 mL | Freq: Four times a day (QID) | RESPIRATORY_TRACT | Status: DC | PRN

## 2019-06-17 MED ORDER — PANTOPRAZOLE SODIUM 40 MG IV SOLR
40.0000 mg | INTRAVENOUS | Status: DC
  Administered 2019-06-17 – 2019-06-20 (×4): 40 mg via INTRAVENOUS
  Filled 2019-06-17 (×6): qty 40

## 2019-06-17 MED ORDER — POTASSIUM CHLORIDE IN NACL 20-0.9 MEQ/L-% IV SOLN
INTRAVENOUS | Status: DC
  Filled 2019-06-17 (×6): qty 1000

## 2019-06-17 MED ORDER — ONDANSETRON HCL 4 MG PO TABS
4.0000 mg | ORAL_TABLET | Freq: Four times a day (QID) | ORAL | Status: DC | PRN
  Administered 2019-06-20: 4 mg via ORAL
  Filled 2019-06-17: qty 1

## 2019-06-17 MED ORDER — ACETAMINOPHEN 650 MG RE SUPP: 650.0000 mg | Freq: Four times a day (QID) | RECTAL | Status: DC | PRN

## 2019-06-17 MED ORDER — ENOXAPARIN SODIUM 40 MG/0.4ML ~~LOC~~ SOLN
40.0000 mg | SUBCUTANEOUS | Status: DC
  Administered 2019-06-17 – 2019-06-20 (×4): 40 mg via SUBCUTANEOUS
  Filled 2019-06-17 (×5): qty 0.4

## 2019-06-17 MED ORDER — SODIUM CHLORIDE 0.9% FLUSH
3.0000 mL | Freq: Once | INTRAVENOUS | Status: AC
  Administered 2019-06-17: 3 mL via INTRAVENOUS

## 2019-06-17 MED ORDER — ACETAMINOPHEN 325 MG PO TABS
650.0000 mg | ORAL_TABLET | Freq: Four times a day (QID) | ORAL | Status: DC | PRN
  Administered 2019-06-18: 650 mg via ORAL
  Filled 2019-06-17: qty 2

## 2019-06-17 MED ORDER — MORPHINE SULFATE (PF) 4 MG/ML IV SOLN
4.0000 mg | Freq: Once | INTRAVENOUS | Status: AC
  Administered 2019-06-17: 16:00:00 4 mg via INTRAVENOUS
  Filled 2019-06-17: qty 1

## 2019-06-17 NOTE — ED Provider Notes (Signed)
[facility] Emergency Department Provider Note   ____________________________________________   First MD Initiated Contact with Patient 06/17/19 1511     (approximate)  I have reviewed the triage vital signs and the nursing notes.   HISTORY  Chief Complaint Abdominal Pain and Emesis    HPI Raven [name] is a 85 y.o. female with history of Crohn's disease, COPD on 3 L nasal cannula, CAD, and hypertension who presents to the ED complaining of abdominal pain.  Patient reports that she has had worsening abdominal pain throughout the entirety of her abdomen since last night.  She describes the pain as sharp and unrelenting, not exacerbated or alleviated by anything.  She has had multiple episodes of vomiting and dry heaving since onset last night, but denies any significant changes in her bowel movements.  She reports having a normal bowel movement earlier this morning, but has not passed any stool since then and does not believe she has been passing gas.  She denies any fevers, cough, chest pain, shortness of breath, dysuria, or hematuria.        Past Medical History:  Diagnosis Date  . Anemia   . Arthritis   . Asthma   . Complication of anesthesia   . COPD (chronic obstructive pulmonary disease) ([place])   . Crohn's disease ([place])   . Depression    after death of husband  . Hyperlipidemia   . Hypertension   . Myocardial infarction ([place])   . Osteopenia   . Peptic ulcer disease   . Pneumonia   . PONV (postoperative nausea and vomiting)     Patient Active Problem List   Diagnosis Date Noted  . SBO (small bowel obstruction) ([place]) 06/17/2019  . COPD exacerbation ([place]) 10/20/2018  . COPD (chronic obstructive pulmonary disease) ([place]) 04/22/2016  . Acute bronchitis 04/17/2016  . Sepsis ([place]) 04/17/2016  . Peroneal tendonitis 07/18/2015  . Pneumonia 04/19/2015  . CAP (community acquired pneumonia) 11/02/2014    Past Surgical History:  Procedure  Laterality Date  . ABDOMINAL SURGERY    . CARDIAC CATHETERIZATION     with stent  . COLONOSCOPY WITH PROPOFOL N/A 09/27/2014   Procedure: COLONOSCOPY WITH PROPOFOL;  Surgeon: [name], MD;  Location: [facility] ENDOSCOPY;  Service: Gastroenterology;  Laterality: N/A;  . EYE SURGERY     bilateral cataract surgeries  . SHOULDER SURGERY Right    x 2   . SMALL INTESTINE SURGERY      Prior to Admission medications   Medication Sig Start Date End Date Taking? Authorizing Provider  Adalimumab (HUMIRA) 40 MG/0.8ML PSKT Inject 40 mg into the skin every 14 (fourteen) days.     [provider]  albuterol (PROVENTIL HFA;VENTOLIN HFA) 108 (90 BASE) MCG/ACT inhaler Inhale 2 puffs into the lungs every 6 (six) hours as needed for wheezing or shortness of breath.    [provider]  amitriptyline (ELAVIL) 10 MG tablet Take 20 mg by mouth at bedtime.    [provider]  amLODipine-benazepril (LOTREL) 5-20 MG per capsule Take 1 capsule by mouth daily.     [provider]  aspirin EC 81 MG tablet Take 81 mg by mouth daily.    [provider]  atorvastatin (LIPITOR) 80 MG tablet Take 80 mg by mouth at bedtime.     [provider]  azithromycin (ZITHROMAX) 250 MG tablet Take as directed 10/23/18   Fritzi Mandes, MD  benzonatate (TESSALON) 100 MG capsule Take by mouth 3 (three)  times daily as needed for cough.    [provider]  Calcium-Phosphorus-Vitamin D (CITRACAL +D3 PO) Take 1 tablet by mouth daily.    [provider]  cefdinir (OMNICEF) 300 MG capsule Take 1 capsule (300 mg total) by mouth every 12 (twelve) hours. 10/23/18   Fritzi Mandes, MD  Chlorpheniramine Maleate (ALLERGY PO) Take 1 tablet by mouth daily.    [provider]  clopidogrel (PLAVIX) 75 MG tablet Take 75 mg by mouth daily.    [provider]  Cyanocobalamin (B-12 PO) Take 1 tablet by mouth daily. Triple B-12    [provider]  Fluticasone-Salmeterol  (ADVAIR) 250-50 MCG/DOSE AEPB Inhale 1 puff into the lungs 2 (two) times daily.    [provider]  furosemide (LASIX) 20 MG tablet Take 1 tablet (20 mg total) by mouth daily. 10/23/18   Fritzi Mandes, MD  mesalamine (PENTASA) 500 MG CR capsule Take 1,000 mg by mouth 2 (two) times daily.    [provider]  metoprolol succinate (TOPROL-XL) 25 MG 24 hr tablet Take 25 mg by mouth daily.    [provider]  Multiple Vitamin (MULTIVITAMIN WITH MINERALS) TABS tablet Take 1 tablet by mouth daily.    [provider]  nitroGLYCERIN (NITROSTAT) 0.4 MG SL tablet Place 0.4 mg under the tongue every 5 (five) minutes as needed for chest pain.    [provider]  potassium chloride (K-DUR,KLOR-CON) 10 MEQ tablet Take 10 mEq by mouth daily.     [provider]  predniSONE (DELTASONE) 10 MG tablet Take 50 mg daily--taper by 10 mg daily then stop 10/23/18   Fritzi Mandes, MD  risedronate (ACTONEL) 150 MG tablet Take 150 mg by mouth every 30 (thirty) days. with water on empty stomach, nothing by mouth or lie down for next 30 minutes.    [provider]  senna (SENOKOT) 8.6 MG tablet Take 2 tablets by mouth at bedtime.     [provider]  thiamine (VITAMIN B-1) 100 MG tablet Take 100 mg by mouth daily.    [provider]  vitamin E 400 UNIT capsule Take 400 Units by mouth daily.    [provider]    Allergies Penicillins, Augmentin [amoxicillin-pot clavulanate], Indocin [indomethacin], Lodine [etodolac], Methotrexate derivatives, Gold, and Zantac [ranitidine hcl]  Family History  Problem Relation Age of Onset  . Hypertension Other     Social History Social History   Tobacco Use  . Smoking status: Former Smoker    Quit date: 1974    Years since quitting: 47.3  . Smokeless tobacco: Never Used  Substance Use Topics  . Alcohol use: Yes    Comment: occassional  . Drug use: No    Review of Systems  Constitutional: No  fever/chills Eyes: No visual changes. ENT: No sore throat. Cardiovascular: Denies chest pain. Respiratory: Denies shortness of breath. Gastrointestinal: Positive for abdominal pain.  Positive for nausea and vomiting.  No diarrhea.  No constipation. Genitourinary: Negative for dysuria. Musculoskeletal: Negative for back pain. Skin: Negative for rash. Neurological: Negative for headaches, focal weakness or numbness.  ____________________________________________   PHYSICAL EXAM:  VITAL SIGNS: ED Triage Vitals  Enc Vitals Group     BP 06/17/19 1031 (!) 141/58     Pulse Rate 06/17/19 1031 93     Resp 06/17/19 1031 20     Temp 06/17/19 1031 98 F (36.7 C)     Temp Source 06/17/19 1407 Oral     SpO2 06/17/19 1031  93 %     Weight 06/17/19 1035 170 lb (77.1 kg)     Height 06/17/19 1035 5' 6"  (1.676 m)     Head Circumference --      Peak Flow --      Pain Score 06/17/19 1035 8     Pain Loc --      Pain Edu? --      Excl. in Col[place]? --     Constitutional: Alert and oriented. Eyes: Conjunctivae are normal. Head: Atraumatic. Nose: No congestion/rhinnorhea. Mouth/Throat: Mucous membranes are moist. Neck: Normal ROM Cardiovascular: Normal rate, regular rhythm. Grossly normal heart sounds. Respiratory: Normal respiratory effort.  No retractions. Lungs CTAB. Gastrointestinal: Soft and diffusely tender to palpation with no rebound or guarding. No distention. Genitourinary: deferred Musculoskeletal: No lower extremity tenderness nor edema. Neurologic:  Normal speech and language. No gross focal neurologic deficits are appreciated. Skin:  Skin is warm, dry and intact. No rash noted. Psychiatric: Mood and affect are normal. Speech and behavior are normal.  ____________________________________________   LABS (all labs ordered are listed, but only abnormal results are displayed)  Labs Reviewed  COMPREHENSIVE METABOLIC PANEL - Abnormal; Notable for the following components:      Result  Value   Potassium 2.9 (*)    Glucose, Bld 161 (*)    Calcium 8.7 (*)    Total Protein 6.4 (*)    All other components within normal limits  CBC - Abnormal; Notable for the following components:   WBC 13.3 (*)    All other components within normal limits  URINALYSIS, COMPLETE (UACMP) WITH MICROSCOPIC - Abnormal; Notable for the following components:   Color, Urine AMBER (*)    APPearance HAZY (*)    Protein, ur 100 (*)    Leukocytes,Ua SMALL (*)    All other components within normal limits  SARS CORONAVIRUS 2 (TAT 6-24 HRS)  LIPASE, BLOOD  MAGNESIUM  POTASSIUM   ____________________________________________  EKG  ED ECG REPORT I, Blake Divine, the attending physician, personally viewed and interpreted this ECG.   Date: 06/17/2019  EKG Time: 10:42  Rate: 91  Rhythm: normal sinus rhythm, PAC's noted  Axis: LAD  Intervals:left anterior fascicular block  ST&T Change: None   PROCEDURES  Procedure(s) performed (including Critical Care):  Procedures   ____________________________________________   INITIAL IMPRESSION / ASSESSMENT AND PLAN / ED COURSE       85 year old female with history of Crohn's disease, COPD, CAD, and hypertension who presents to the ED complaining of abdominal pain worsening since last night with multiple episodes of nausea and vomiting.  She does have a history of small bowel strictures as well as enterectomy, and I would be concerned for small bowel obstruction.  We will further assess with CT imaging, treat with IV morphine and IV Zofran.  Lab work significant for hypokalemia, we will check magnesium levels and replete potassium.  EKG without evidence of arrhythmia or ischemia, doubt cardiac etiology for symptoms.  UA shows no evidence of infection.  CT scan concerning for small bowel obstruction associated with patient's small bowel anastomosis.  She states her pain is improved following dose of morphine and she is no longer actively dry heaving.   Case discussed with hospitalist, who accepts patient for admission.  Dr. Dahlia Byes of general surgery was also consulted.      ____________________________________________   FINAL CLINICAL IMPRESSION(S) / ED DIAGNOSES  Final diagnoses:  SBO (small bowel obstruction) (HCC)  Crohn's disease of small intestine with intestinal obstruction (  [facility])     ED Discharge Orders    None       Note:  This document was prepared using Dragon voice recognition software and may include unintentional dictation errors.   Blake Divine, MD 06/17/19 [phone]

## 2019-06-17 NOTE — Consult Note (Signed)
PHARMACY CONSULT NOTE - FOLLOW UP  Pharmacy Consult for Electrolyte Monitoring and Replacement   Recent Labs: Potassium (mmol/L)  Date Value  06/17/2019 2.9 (L)  06/09/2014 3.4 (L)   Magnesium (mg/dL)  Date Value  06/17/2019 1.8  08/29/2011 1.8   Calcium (mg/dL)  Date Value  06/17/2019 8.7 (L)   Calcium, Total (mg/dL)  Date Value  06/09/2014 7.7 (L)   Albumin (g/dL)  Date Value  06/17/2019 4.1  06/10/2014 2.9 (L)   Sodium (mmol/L)  Date Value  06/17/2019 142  06/09/2014 137     Assessment: Pharmacy has been consulted for potassium replacement.  Scr 0.80  K 2.9, hypokalemic. KCL 10 mEq x2 runs ordered and given to the patient.   Goal of Therapy:  Potassium ~ 4   Plan:  1. Will order an additional KCL 10 mEq x 3 runs for a total of 50 mEq replacement. Will f/u with K @ 2300.   Rowland Lathe ,PharmD Clinical Pharmacist 06/17/2019 5:41 PM

## 2019-06-17 NOTE — ED Notes (Signed)
PT initial O2 sats 84%.  Pt states she wears O2 at night and when needed.  Pt states normal rate of 3L.  Pt taking shallow breaths in triage.  Pt place on 3L [place] and sats recovered into mid 90s.  Pt denies SHOB at this time.

## 2019-06-17 NOTE — H&P (Signed)
[place] at [employer] NAME: Raven [name]    MR#:  [id number]  DATE OF BIRTH:  12-Mar-1934  DATE OF ADMISSION:  06/17/2019  PRIMARY CARE PHYSICIAN: Baxter Hire, MD   REQUESTING/REFERRING PHYSICIAN: Dr Blake Divine  Patient coming from : home. Lives alone is independent of her ADLs   CHIEF COMPLAINT:  abdominal pain nausea vomiting and currently dry heaving since last evening  HISTORY OF PRESENT ILLNESS:  Raven [name]  is a 85 y.o. female with a known history of Crohn's disease status post small bowel enrterectomy about eight years ago, COPD on home oxygen, depression, hyperlipidemia, DJD, history of peptic ulcer disease comes to the emergency room after she started having vomiting times two last night with abdominal pain. Patient is dry heaving. Continues to have on and off abdominal colic pain. Does not seem to be passing gas.  ED course: please stable. She has symptoms of dry heaves. Received some antiemetic and pain meds. CT of the abdomen was done showed small bowel obstruction/ileus. Patient currently not vomiting. She is being admitted for further evaluation management.  Physician to discuss with surgery. PAST MEDICAL HISTORY:   Past Medical History:  Diagnosis Date  . Anemia   . Arthritis   . Asthma   . Complication of anesthesia   . COPD (chronic obstructive pulmonary disease) ([place])   . Crohn's disease ([place])   . Depression    after death of husband  . Hyperlipidemia   . Hypertension   . Myocardial infarction ([place])   . Osteopenia   . Peptic ulcer disease   . Pneumonia   . PONV (postoperative nausea and vomiting)     PAST SURGICAL HISTOIRY:   Past Surgical History:  Procedure Laterality Date  . ABDOMINAL SURGERY    . CARDIAC CATHETERIZATION     with stent  . COLONOSCOPY WITH PROPOFOL N/A 09/27/2014   Procedure: COLONOSCOPY WITH PROPOFOL;  Surgeon: [name], MD;  Location: [facility] ENDOSCOPY;  Service:  Gastroenterology;  Laterality: N/A;  . EYE SURGERY     bilateral cataract surgeries  . SHOULDER SURGERY Right    x 2   . SMALL INTESTINE SURGERY      SOCIAL HISTORY:   Social History   Tobacco Use  . Smoking status: Former Smoker    Quit date: 1974    Years since quitting: 47.3  . Smokeless tobacco: Never Used  Substance Use Topics  . Alcohol use: Yes    Comment: occassional    FAMILY HISTORY:   Family History  Problem Relation Age of Onset  . Hypertension Other     DRUG ALLERGIES:   Allergies  Allergen Reactions  . Penicillins Other (See Comments)    Reaction:  Unknown  Has patient had a PCN reaction causing immediate rash, facial/tongue/throat swelling, SOB or lightheadedness with hypotension: unknown Has patient had a PCN reaction causing severe rash involving mucus membranes or skin necrosis: unknown Has patient had a PCN reaction that required hospitalization No Has patient had a PCN reaction occurring within the last 10 years: No If all of the above answers are "NO", then may proceed with Cephalosporin use.   . Augmentin [Amoxicillin-Pot Clavulanate] Other (See Comments)    Reaction:  Unknown   . Indocin [Indomethacin] Other (See Comments)    Reaction:  Unknown   . Lodine [Etodolac] Other (See Comments)    Reaction:   GI upset   . Methotrexate Derivatives Other (See Comments)  Reaction:  GI upset   . Gold Rash  . Zantac [Ranitidine Hcl] Rash    REVIEW OF SYSTEMS:  Review of Systems  Constitutional: Negative for chills, fever and weight loss.  HENT: Negative for ear discharge, ear pain and nosebleeds.   Eyes: Negative for blurred vision, pain and discharge.  Respiratory: Negative for sputum production, shortness of breath, wheezing and stridor.   Cardiovascular: Negative for chest pain, palpitations, orthopnea and PND.  Gastrointestinal: Positive for abdominal pain and nausea. Negative for diarrhea and vomiting.  Genitourinary: Negative for  frequency and urgency.  Musculoskeletal: Negative for back pain and joint pain.  Neurological: Positive for weakness. Negative for sensory change, speech change and focal weakness.  Psychiatric/Behavioral: Negative for depression and hallucinations. The patient is not nervous/anxious.      MEDICATIONS AT HOME:   Prior to Admission medications   Medication Sig Start Date End Date Taking? Authorizing Provider  Adalimumab (HUMIRA) 40 MG/0.8ML PSKT Inject 40 mg into the skin every 14 (fourteen) days.     [provider]  albuterol (PROVENTIL HFA;VENTOLIN HFA) 108 (90 BASE) MCG/ACT inhaler Inhale 2 puffs into the lungs every 6 (six) hours as needed for wheezing or shortness of breath.    [provider]  amitriptyline (ELAVIL) 10 MG tablet Take 20 mg by mouth at bedtime.    [provider]  amLODipine-benazepril (LOTREL) 5-20 MG per capsule Take 1 capsule by mouth daily.     [provider]  aspirin EC 81 MG tablet Take 81 mg by mouth daily.    [provider]  atorvastatin (LIPITOR) 80 MG tablet Take 80 mg by mouth at bedtime.     [provider]  azithromycin (ZITHROMAX) 250 MG tablet Take as directed 10/23/18   Fritzi Mandes, MD  benzonatate (TESSALON) 100 MG capsule Take by mouth 3 (three) times daily as needed for cough.    [provider]  Calcium-Phosphorus-Vitamin D (CITRACAL +D3 PO) Take 1 tablet by mouth daily.    [provider]  cefdinir (OMNICEF) 300 MG capsule Take 1 capsule (300 mg total) by mouth every 12 (twelve) hours. 10/23/18   Fritzi Mandes, MD  Chlorpheniramine Maleate (ALLERGY PO) Take 1 tablet by mouth daily.    [provider]  clopidogrel (PLAVIX) 75 MG tablet Take 75 mg by mouth daily.    [provider]  Cyanocobalamin (B-12 PO) Take 1 tablet by mouth daily. Triple B-12    [provider]  Fluticasone-Salmeterol (ADVAIR) 250-50 MCG/DOSE AEPB Inhale 1 puff into the lungs 2 (two)  times daily.    [provider]  furosemide (LASIX) 20 MG tablet Take 1 tablet (20 mg total) by mouth daily. 10/23/18   Fritzi Mandes, MD  mesalamine (PENTASA) 500 MG CR capsule Take 1,000 mg by mouth 2 (two) times daily.    [provider]  metoprolol succinate (TOPROL-XL) 25 MG 24 hr tablet Take 25 mg by mouth daily.    [provider]  Multiple Vitamin (MULTIVITAMIN WITH MINERALS) TABS tablet Take 1 tablet by mouth daily.    [provider]  nitroGLYCERIN (NITROSTAT) 0.4 MG SL tablet Place 0.4 mg under the tongue every 5 (five) minutes as needed for chest pain.    [provider]  potassium chloride (K-DUR,KLOR-CON) 10 MEQ tablet Take 10 mEq by mouth daily.     [provider]  predniSONE (DELTASONE) 10 MG tablet Take 50 mg daily--taper by 10 mg daily then stop 10/23/18   Posey Pronto,  Moesha Sarchet, MD  risedronate (ACTONEL) 150 MG tablet Take 150 mg by mouth every 30 (thirty) days. with water on empty stomach, nothing by mouth or lie down for next 30 minutes.    [provider]  senna (SENOKOT) 8.6 MG tablet Take 2 tablets by mouth at bedtime.     [provider]  thiamine (VITAMIN B-1) 100 MG tablet Take 100 mg by mouth daily.    [provider]  vitamin E 400 UNIT capsule Take 400 Units by mouth daily.    [provider]      VITAL SIGNS:  Blood pressure (!) 141/66, pulse 78, temperature 98.9 F (37.2 C), temperature source Oral, resp. rate 13, height 5' 6"  (1.676 m), weight 77.1 kg, SpO2 99 %.  PHYSICAL EXAMINATION:  GENERAL:  85 y.o.-year-old patient lying in the bed with no acute distress.  EYES: Pupils equal, round, reactive to light and accommodation. No scleral icterus.  HEENT: Head atraumatic, normocephalic. Oropharynx and nasopharynx clear.  NECK:  Supple, no jugular venous distention. No thyroid enlargement, no tenderness.  LUNGS: Normal breath sounds bilaterally, no wheezing, rales,rhonchi or crepitation.  No use of accessory muscles of respiration.  CARDIOVASCULAR: S1, S2 normal. No murmurs, rubs, or gallops.  ABDOMEN: Soft, diffuse abdominal pain. You bowel sounds present. No organomegaly or mass.  EXTREMITIES: No pedal edema, cyanosis, or clubbing.  NEUROLOGIC: Cranial nerves II through XII are intact. Muscle strength 5/5 in all extremities. Sensation intact. Gait not checked.  PSYCHIATRIC: The patient is alert and oriented x 3.  SKIN: No obvious rash, lesion, or ulcer.   LABORATORY PANEL:   CBC Recent Labs  Lab 06/17/19 1040  WBC 13.3*  HGB 14.0  HCT 42.6  PLT 215   ------------------------------------------------------------------------------------------------------------------  Chemistries  Recent Labs  Lab 06/17/19 1040  NA 142  K 2.9*  CL 103  CO2 26  GLUCOSE 161*  BUN 23  CREATININE 0.80  CALCIUM 8.7*  MG 1.8  AST 20  ALT 20  ALKPHOS 64  BILITOT 0.8   ------------------------------------------------------------------------------------------------------------------  Cardiac Enzymes No results for input(s): TROPONINI in the last 168 hours. ------------------------------------------------------------------------------------------------------------------  RADIOLOGY:  CT Abdomen Pelvis W Contrast  Result Date: 06/17/2019 CLINICAL DATA:  85 year old female with acute abdominal pain. EXAM: CT ABDOMEN AND PELVIS WITH CONTRAST TECHNIQUE: Multidetector CT imaging of the abdomen and pelvis was performed using the standard protocol following bolus administration of intravenous contrast. CONTRAST:  186m OMNIPAQUE IOHEXOL 300 MG/ML  SOLN COMPARISON:  CT abdomen pelvis dated 02/15/2015. FINDINGS: Lower chest: There are bibasilar atelectasis/scarring. There is background of emphysema. Three vessel coronary vascular calcification. There is no intra-abdominal free air. There is a small free fluid in the upper abdomen and within the pelvis. Hepatobiliary: The liver is  unremarkable. No intrahepatic biliary ductal dilatation. The gallbladder is unremarkable. Pancreas: Unremarkable. No pancreatic ductal dilatation or surrounding inflammatory changes. Spleen: Normal in size without focal abnormality. Adrenals/Urinary Tract: The adrenal glands are unremarkable. Mild bilateral renal parenchyma atrophy and cortical scarring. There is no hydronephrosis on either side. There is symmetric enhancement and excretion of contrast by both kidneys. The visualized ureters and urinary bladder appear unremarkable. Stomach/Bowel: There is postsurgical changes of distal small bowel resection with anastomotic suture. There is mild thickening of the terminal ileum distal to the suture. There is dilatation of the loops of small bowel proximal to the anastomosis in keeping with a degree of obstruction. In addition there is apparent thickening of a segment of the small bowel in the  left lower abdomen (series 2, image 72 and coronal series 5 image 51). There is mild dilatation of the loop of small bowel proximal to the segments which may be related to adhesion. There is colonic diverticulosis without active inflammatory changes. Vascular/Lymphatic: There is advanced aortoiliac atherosclerotic disease. The IVC is unremarkable. No portal venous gas. There is no adenopathy. Reproductive: Hysterectomy. Other: Small fat containing umbilical hernia. Musculoskeletal: Osteopenia with degenerative changes of the spine. No acute osseous pathology. IMPRESSION: 1. Postsurgical changes of distal small bowel resection with findings concerning for a degree of obstruction proximal to the anastomosis. In addition additional segments of apparent small bowel thickening with mild dilatation of the proximal adjacent loops may represent additional areas of obstruction possibly secondary to adhesion. Small-bowel series may provide better evaluation. Colonic diverticulosis. 2. Small ascites. 3. Aortic Atherosclerosis  (ICD10-I70.0) and Emphysema (ICD10-J43.9). Electronically Signed   By: Anner Crete M.D.   On: 06/17/2019 17:09    EKG:    IMPRESSION AND PLAN:   Raven [name]  is a 85 y.o. female with a known history of Crohn's disease status post small bowel enrterectomy about eight years ago, COPD on home oxygen, depression, hyperlipidemia, DJD, history of peptic ulcer disease comes to the emergency room after she started having vomiting times two last night with abdominal pain.  1. Small bowel obstruction/ileus -presented with abdominal pain, vomiting times two, dry heaves and abnormal CT abdomen -admit to surgical floor -NPO except ice chips -IV fluid 's-surgical consultation with Dr. Adora Fridge -- secure chat message sent  -PRN antiemetics, PRN pain meds -nasogastric tube if patient starts vomiting -await further recommendations from surgery.  2. Chronic respiratory failure with COPD on chronic home oxygen -patient currently is 95% on 3 L. -As needed nebulizer. Patient is not in respiratory distress  3. Hypokalemia -pharmacy consultation for repeating electrolytes  4. History of of Crohn's disease -on Humira,prednsione and Mesalamine  5. HTN -will resume BB, Loterl when able to take po -prn IV hydralazine  6.h/o PUD -cont IV PPI  7.HL -on Statins   Family Communication : spoke with daughter Ms. Rosana Hoes on the phone Consults : surgery Code Status : DNR prior to admission confirmed with patient DVT prophylaxis : Lovenox  TOTAL TIME TAKING CARE OF THIS PATIENT: *50** minutes.    Fritzi Mandes M.D  [facility]     CC: Primary care physician; Baxter Hire, MD

## 2019-06-17 NOTE — ED Triage Notes (Signed)
Pt to ER via EMS from home with c/o abdominal pain and vomiting since 11pm last night.  Pt in obvious pain in triage.  Pt reports small BM this AM and continued belching.

## 2019-06-18 ENCOUNTER — Inpatient Hospital Stay: Payer: Medicare Other

## 2019-06-18 LAB — CBC
HCT: 39.6 % (ref 36.0–46.0)
Hemoglobin: 12.5 g/dL (ref 12.0–15.0)
MCH: 31.6 pg (ref 26.0–34.0)
MCHC: 31.6 g/dL (ref 30.0–36.0)
MCV: 100 fL (ref 80.0–100.0)
Platelets: 166 10*3/uL (ref 150–400)
RBC: 3.96 MIL/uL (ref 3.87–5.11)
RDW: 13.7 % (ref 11.5–15.5)
WBC: 8.8 10*3/uL (ref 4.0–10.5)
nRBC: 0 % (ref 0.0–0.2)

## 2019-06-18 LAB — SARS CORONAVIRUS 2 (TAT 6-24 HRS): SARS Coronavirus 2: NEGATIVE

## 2019-06-18 LAB — BASIC METABOLIC PANEL
Anion gap: 11 (ref 5–15)
BUN: 18 mg/dL (ref 8–23)
CO2: 25 mmol/L (ref 22–32)
Calcium: 7.9 mg/dL — ABNORMAL LOW (ref 8.9–10.3)
Chloride: 107 mmol/L (ref 98–111)
Creatinine, Ser: 0.75 mg/dL (ref 0.44–1.00)
GFR calc Af Amer: >60 mL/min (ref >60)
GFR calc non Af Amer: >60 mL/min (ref >60)
Glucose, Bld: 89 mg/dL (ref 70–99)
Potassium: 3.7 mmol/L (ref 3.5–5.1)
Sodium: 143 mmol/L (ref 135–145)

## 2019-06-18 LAB — POTASSIUM: Potassium: 3.9 mmol/L (ref 3.5–5.1)

## 2019-06-18 MED ORDER — ALUM & MAG HYDROXIDE-SIMETH 200-200-20 MG/5ML PO SUSP: 15.0000 mL | Freq: Four times a day (QID) | ORAL | Status: DC | PRN

## 2019-06-18 MED ORDER — POTASSIUM CHLORIDE 10 MEQ/100ML IV SOLN
10.0000 meq | Freq: Once | INTRAVENOUS | Status: AC
  Administered 2019-06-18: 10 meq via INTRAVENOUS

## 2019-06-18 MED ORDER — ALBUMIN HUMAN 25 % IV SOLN
25.0000 g | Freq: Once | INTRAVENOUS | Status: AC
  Administered 2019-06-18: 25 g via INTRAVENOUS
  Filled 2019-06-18: qty 100

## 2019-06-18 MED ORDER — METHYLPREDNISOLONE SODIUM SUCC 125 MG IJ SOLR
60.0000 mg | Freq: Every day | INTRAMUSCULAR | Status: DC
  Administered 2019-06-18 – 2019-06-20 (×3): 60 mg via INTRAVENOUS
  Filled 2019-06-18 (×3): qty 2

## 2019-06-18 NOTE — Progress Notes (Signed)
Patient ID: Raven [name], female   DOB: [birth year], 85 y.o.   MRN: [id number]  Given h/o Waldon Merl will have GI see pt. Dr Allen Norris aware  Pt follows with Dr Gustavo Lah

## 2019-06-18 NOTE — Consult Note (Signed)
PHARMACY CONSULT NOTE - FOLLOW UP  Pharmacy Consult for Electrolyte Monitoring and Replacement   Recent Labs: Potassium (mmol/L)  Date Value  06/18/2019 3.7  06/09/2014 3.4 (L)   Magnesium (mg/dL)  Date Value  06/17/2019 1.8  08/29/2011 1.8   Calcium (mg/dL)  Date Value  06/18/2019 7.9 (L)   Calcium, Total (mg/dL)  Date Value  06/09/2014 7.7 (L)   Albumin (g/dL)  Date Value  06/17/2019 4.1  06/10/2014 2.9 (L)   Sodium (mmol/L)  Date Value  06/18/2019 143  06/09/2014 137     Assessment: Pharmacy has been consulted for potassium replacement.  Scr 0.75, NPO  K 3.7 on NS w 16mq KCL 261m @ 10075mr  Goal of Therapy:  Potassium ~ 4   Plan:  No further replenishment warranted at this time - will follow with am labs  ChaLu DuffelharmD Clinical Pharmacist 06/18/2019 9:23 AM

## 2019-06-18 NOTE — Progress Notes (Signed)
[facility] [place] NAME: Ashelyn Mccravy    MR#:  [id number]  DATE OF BIRTH:  1934-04-10  SUBJECTIVE:  patient complains of diffuse abdominal pain. No vomiting or dry heaves. Some cough with congestion. No fever.  REVIEW OF SYSTEMS:   Review of Systems  Constitutional: Positive for malaise/fatigue. Negative for chills, fever and weight loss.  HENT: Negative for ear discharge, ear pain and nosebleeds.   Eyes: Negative for blurred vision, pain and discharge.  Respiratory: Positive for cough. Negative for sputum production, shortness of breath, wheezing and stridor.   Cardiovascular: Negative for chest pain, palpitations, orthopnea and PND.  Gastrointestinal: Positive for abdominal pain. Negative for diarrhea, nausea and vomiting.  Genitourinary: Negative for frequency and urgency.  Musculoskeletal: Negative for back pain and joint pain.  Neurological: Negative for sensory change, speech change, focal weakness and weakness.  Psychiatric/Behavioral: Negative for depression and hallucinations. The patient is not nervous/anxious.    Tolerating Diet:NPO Tolerating PT:   DRUG ALLERGIES:   Allergies  Allergen Reactions  . Penicillins Other (See Comments)    Reaction:  Unknown  Has patient had a PCN reaction causing immediate rash, facial/tongue/throat swelling, SOB or lightheadedness with hypotension: unknown Has patient had a PCN reaction causing severe rash involving mucus membranes or skin necrosis: unknown Has patient had a PCN reaction that required hospitalization No Has patient had a PCN reaction occurring within the last 10 years: No If all of the above answers are "NO", then may proceed with Cephalosporin use.   . Augmentin [Amoxicillin-Pot Clavulanate] Other (See Comments)    Reaction:  Unknown   . Indocin [Indomethacin] Other (See Comments)    Reaction:  Unknown   . Lodine [Etodolac] Other (See Comments)    Reaction:   GI upset    . Methotrexate Derivatives Other (See Comments)    Reaction:  GI upset   . Gold Rash  . Zantac [Ranitidine Hcl] Rash    VITALS:  Blood pressure 133/63, pulse 80, temperature 99.1 F (37.3 C), temperature source Oral, resp. rate 16, height 5' 6"  (1.676 m), weight 77.1 kg, SpO2 92 %.  PHYSICAL EXAMINATION:   Physical Exam  GENERAL:  85 y.o.-year-old patient lying in the bed with no acute distress. Obese EYES: Pupils equal, round, reactive to light and accommodation. No scleral icterus.   HEENT: Head atraumatic, normocephalic. Oropharynx and nasopharynx clear.  NECK:  Supple, no jugular venous distention. No thyroid enlargement, no tenderness.  LUNGS: Normal breath sounds bilaterally, no wheezing, rales, rhonchi. No use of accessory muscles of respiration.  CARDIOVASCULAR: S1, S2 normal. No murmurs, rubs, or gallops.  ABDOMEN: Soft, mild diffuse tenderness, mild distended. Bowel sounds present. No organomegaly or mass.  EXTREMITIES: No cyanosis, clubbing or edema b/l.    NEUROLOGIC: Cranial nerves II through XII are intact. No focal Motor or sensory deficits b/l.   PSYCHIATRIC:  patient is alert and oriented x 3.  SKIN: No obvious rash, lesion, or ulcer.   LABORATORY PANEL:  CBC Recent Labs  Lab 06/17/19 1040  WBC 13.3*  HGB 14.0  HCT 42.6  PLT 215    Chemistries  Recent Labs  Lab 06/17/19 1040 06/18/19 0002 06/18/19 0720  NA 142  --  143  K 2.9*   < > 3.7  CL 103  --  107  CO2 26  --  25  GLUCOSE 161*  --  89  BUN 23  --  18  CREATININE 0.80  --  0.75  CALCIUM 8.7*  --  7.9*  MG 1.8  --   --   AST 20  --   --   ALT 20  --   --   ALKPHOS 64  --   --   BILITOT 0.8  --   --    < > = values in this interval not displayed.   Cardiac Enzymes No results for input(s): TROPONINI in the last 168 hours. RADIOLOGY:  CT Abdomen Pelvis W Contrast  Result Date: 06/17/2019 CLINICAL DATA:  85 year old female with acute abdominal pain. EXAM: CT ABDOMEN AND PELVIS WITH  CONTRAST TECHNIQUE: Multidetector CT imaging of the abdomen and pelvis was performed using the standard protocol following bolus administration of intravenous contrast. CONTRAST:  126m OMNIPAQUE IOHEXOL 300 MG/ML  SOLN COMPARISON:  CT abdomen pelvis dated 02/15/2015. FINDINGS: Lower chest: There are bibasilar atelectasis/scarring. There is background of emphysema. Three vessel coronary vascular calcification. There is no intra-abdominal free air. There is a small free fluid in the upper abdomen and within the pelvis. Hepatobiliary: The liver is unremarkable. No intrahepatic biliary ductal dilatation. The gallbladder is unremarkable. Pancreas: Unremarkable. No pancreatic ductal dilatation or surrounding inflammatory changes. Spleen: Normal in size without focal abnormality. Adrenals/Urinary Tract: The adrenal glands are unremarkable. Mild bilateral renal parenchyma atrophy and cortical scarring. There is no hydronephrosis on either side. There is symmetric enhancement and excretion of contrast by both kidneys. The visualized ureters and urinary bladder appear unremarkable. Stomach/Bowel: There is postsurgical changes of distal small bowel resection with anastomotic suture. There is mild thickening of the terminal ileum distal to the suture. There is dilatation of the loops of small bowel proximal to the anastomosis in keeping with a degree of obstruction. In addition there is apparent thickening of a segment of the small bowel in the left lower abdomen (series 2, image 72 and coronal series 5 image 51). There is mild dilatation of the loop of small bowel proximal to the segments which may be related to adhesion. There is colonic diverticulosis without active inflammatory changes. Vascular/Lymphatic: There is advanced aortoiliac atherosclerotic disease. The IVC is unremarkable. No portal venous gas. There is no adenopathy. Reproductive: Hysterectomy. Other: Small fat containing umbilical hernia. Musculoskeletal:  Osteopenia with degenerative changes of the spine. No acute osseous pathology. IMPRESSION: 1. Postsurgical changes of distal small bowel resection with findings concerning for a degree of obstruction proximal to the anastomosis. In addition additional segments of apparent small bowel thickening with mild dilatation of the proximal adjacent loops may represent additional areas of obstruction possibly secondary to adhesion. Small-bowel series may provide better evaluation. Colonic diverticulosis. 2. Small ascites. 3. Aortic Atherosclerosis (ICD10-I70.0) and Emphysema (ICD10-J43.9). Electronically Signed   By: AAnner CreteM.D.   On: 06/17/2019 17:09   ASSESSMENT AND PLAN:   SCipriana Biller is a 85y.o. female with a known history of Crohn's disease status post small bowel enrterectomy about eight years ago, COPD on home oxygen, depression, hyperlipidemia, DJD, history of peptic ulcer disease comes to the emergency room after she started having vomiting times two last night with abdominal pain.  1. Small bowel obstruction/ileus -presented with abdominal pain, vomiting times two, dry heaves and abnormal CT abdomen -NPO except ice chips -IV fluid -surgical consultation with Dr. PDahlia Byes -PRN antiemetics, PRN pain meds -nasogastric tube if patient starts vomiting -await further recommendations from surgery.  2. Chronic respiratory failure with COPD on chronic home oxygen -patient currently is 95% on 3 L. -As needed nebulizer.  Patient is not in respiratory distress  3. Hypokalemia -K repleted  4. History of of Crohn's disease -on Humira and Mesalamine  5. HTN -will resume BB, Loterel when able to take po -prn IV hydralazine  6.h/o PUD -cont IV PPI  7.HL -on Statins   Family Communication : spoke with daughter Ms. Davis on the phone 4/10 Consults : surgery Code Status : DNR prior to admission confirmed with patient DVT prophylaxis : Lovenox Discharge Disposition  :   TOTAL TIME TAKING CARE OF THIS PATIENT: *30* minutes.  >50% time spent on counselling and coordination of care  Note: This dictation was prepared with Dragon dictation along with smaller phrase technology. Any transcriptional errors that result from this process are unintentional.  Fritzi Mandes M.D    [facility]   CC: Primary care physician; Baxter Hire, MDPatient ID: [name], female   DOB: March 31, 1934, 85 y.o.   MRN: [id number]

## 2019-06-18 NOTE — Consult Note (Signed)
PHARMACY CONSULT NOTE - FOLLOW UP  Pharmacy Consult for Electrolyte Monitoring and Replacement   Recent Labs: Potassium (mmol/L)  Date Value  06/18/2019 3.9  06/09/2014 3.4 (L)   Magnesium (mg/dL)  Date Value  06/17/2019 1.8  08/29/2011 1.8   Calcium (mg/dL)  Date Value  06/17/2019 8.7 (L)   Calcium, Total (mg/dL)  Date Value  06/09/2014 7.7 (L)   Albumin (g/dL)  Date Value  06/17/2019 4.1  06/10/2014 2.9 (L)   Sodium (mmol/L)  Date Value  06/17/2019 142  06/09/2014 137     Assessment: Pharmacy has been consulted for potassium replacement.  Scr 0.80  K 2.9, hypokalemic. KCL 10 mEq x2 runs ordered and given to the patient.   Goal of Therapy:  Potassium ~ 4   Plan:  04/11 @ 0000 K 3.9 WNL will recheck w/ am labs and continue to monitor.  Tobie Lords ,PharmD Clinical Pharmacist 06/18/2019 1:32 AM

## 2019-06-18 NOTE — Consult Note (Signed)
Raven Lame, MD [facility]  [address], [place], [place] [address] Phone: [phone] Fax : [phone]  Consultation  Referring Provider:     Dr. Posey Pronto Primary Care Physician:  Baxter Hire, MD Primary Gastroenterologist:  [facility]         Reason for Consultation:     Crohn's disease  Date of Admission:  06/17/2019 Date of Consultation:  06/18/2019         HPI:   Raven [name] is a 85 y.o. female who has a history of Crohn's disease and had a resection by Dr. Tamala Julian about 8 years ago.  The patient states that she has been doing well since then and has been on Humira.  She also states that she has been treated for many years with 5 mg of prednisone every day.  The patient reports that she was last seen by one of the PAs at [place] [facility] [facility] last month.  She also reports that she has not had a flare of her Crohn's disease since having her surgery.  The patient was admitted with abdominal pain nausea vomiting and dry heaves.  There is no report of any hematemesis.  The patient reports that she had some diarrhea yesterday.  The patient had a CT scan that showed:  IMPRESSION: 1. Postsurgical changes of distal small bowel resection with findings concerning for a degree of obstruction proximal to the anastomosis. In addition additional segments of apparent small bowel thickening with mild dilatation of the proximal adjacent loops may represent additional areas of obstruction possibly secondary to adhesion. Small-bowel series may provide better evaluation. Colonic diverticulosis. 2. Small ascites. 3. Aortic Atherosclerosis (ICD10-I70.0) and Emphysema (ICD10-J43.9).  The patient reports that she had her last Humira dose 2 weeks ago and is due for her next dose.  The patient was seen by surgery who recommended an NG tube which was placed approximate 45 minutes ago.  Past Medical History:  Diagnosis Date  . Anemia   . Arthritis   . Asthma   . Complication of anesthesia   . COPD  (chronic obstructive pulmonary disease) ([place])   . Crohn's disease ([place])   . Depression    after death of husband  . Hyperlipidemia   . Hypertension   . Myocardial infarction ([place])   . Osteopenia   . Peptic ulcer disease   . Pneumonia   . PONV (postoperative nausea and vomiting)     Past Surgical History:  Procedure Laterality Date  . ABDOMINAL SURGERY    . CARDIAC CATHETERIZATION     with stent  . COLONOSCOPY WITH PROPOFOL N/A 09/27/2014   Procedure: COLONOSCOPY WITH PROPOFOL;  Surgeon: [name], MD;  Location: [facility] ENDOSCOPY;  Service: Gastroenterology;  Laterality: N/A;  . EYE SURGERY     bilateral cataract surgeries  . SHOULDER SURGERY Right    x 2   . SMALL INTESTINE SURGERY      Prior to Admission medications   Medication Sig Start Date End Date Taking? Authorizing Provider  Adalimumab (HUMIRA) 40 MG/0.8ML PSKT Inject 40 mg into the skin every 14 (fourteen) days.    Yes [provider]  albuterol (PROVENTIL HFA;VENTOLIN HFA) 108 (90 BASE) MCG/ACT inhaler Inhale 2 puffs into the lungs every 6 (six) hours as needed for wheezing or shortness of breath.   Yes [provider]  amitriptyline (ELAVIL) 10 MG tablet Take 20 mg by mouth at bedtime.   Yes [provider]  amLODipine-benazepril (LOTREL) 5-20 MG  per capsule Take 1 capsule by mouth daily.    Yes [provider]  ANORO ELLIPTA 62.5-25 MCG/INH AEPB Inhale 1 puff into the lungs daily. 05/29/19  Yes [provider]  aspirin EC 81 MG tablet Take 81 mg by mouth daily.   Yes [provider]  atorvastatin (LIPITOR) 80 MG tablet Take 80 mg by mouth at bedtime.    Yes [provider]  azithromycin (ZITHROMAX) 250 MG tablet Take as directed 10/23/18  Yes Fritzi Mandes, MD  benzonatate (TESSALON) 100 MG capsule Take by mouth 3 (three) times daily as needed for cough.   Yes [provider]  Calcium-Phosphorus-Vitamin D (CITRACAL +D3 PO) Take 1 tablet by mouth daily.    Yes [provider]  clopidogrel (PLAVIX) 75 MG tablet Take 75 mg by mouth daily.   Yes [provider]  Cyanocobalamin (B-12 PO) Take 1 tablet by mouth daily. Triple B-12   Yes [provider]  esomeprazole (NEXIUM) 40 MG capsule Take 40 mg by mouth daily. 05/29/19  Yes [provider]  Fluticasone-Salmeterol (ADVAIR) 250-50 MCG/DOSE AEPB Inhale 1 puff into the lungs 2 (two) times daily.   Yes [provider]  furosemide (LASIX) 20 MG tablet Take 1 tablet (20 mg total) by mouth daily. 10/23/18  Yes Fritzi Mandes, MD  HYDROMET 5-1.5 MG/5ML syrup Take 5 mLs by mouth every 6 (six) hours as needed. 06/14/19  Yes [provider]  leflunomide (ARAVA) 10 MG tablet Take 10 mg by mouth daily. 03/29/19  Yes [provider]  mesalamine (PENTASA) 500 MG CR capsule Take 1,000 mg by mouth 2 (two) times daily.   Yes [provider]  metoprolol succinate (TOPROL-XL) 25 MG 24 hr tablet Take 25 mg by mouth daily.   Yes [provider]  Multiple Vitamin (MULTIVITAMIN WITH MINERALS) TABS tablet Take 1 tablet by mouth daily.   Yes [provider]  nitroGLYCERIN (NITROSTAT) 0.4 MG SL tablet Place 0.4 mg under the tongue every 5 (five) minutes as needed for chest pain.   Yes [provider]  potassium chloride (K-DUR,KLOR-CON) 10 MEQ tablet Take 10 mEq by mouth daily.    Yes [provider]  predniSONE (DELTASONE) 10 MG tablet Take 50 mg daily--taper by 10 mg daily then stop 10/23/18  Yes Fritzi Mandes, MD  risedronate (ACTONEL) 150 MG tablet Take 150 mg by mouth every 30 (thirty) days. with water on empty stomach, nothing by mouth or lie down for next 30 minutes.   Yes [provider]  senna (SENOKOT) 8.6 MG tablet Take 2 tablets by mouth at bedtime.    Yes [provider]  thiamine (VITAMIN B-1) 100 MG tablet Take 100 mg by mouth daily.   Yes [provider]  vitamin E 400 UNIT capsule Take 400  Units by mouth daily.   Yes [provider]  methylPREDNISolone (MEDROL DOSEPAK) 4 MG TBPK tablet Take 4 mg by mouth daily. 06/14/19   [provider]  potassium chloride (KLOR-CON) 10 MEQ tablet Take 10 mEq by mouth daily. 05/29/19   [provider]    Family History  Problem Relation Age of Onset  . Hypertension Other      Social History   Tobacco Use  . Smoking status: Former Smoker    Quit date: 1974    Years since quitting: 47.3  . Smokeless tobacco: Never Used  Substance Use Topics  . Alcohol use: Yes    Comment: occassional  . Drug  use: No    Allergies as of 06/17/2019 - Review Complete 06/17/2019  Allergen Reaction Noted  . Penicillins Other (See Comments) 11/02/2014  . Augmentin [amoxicillin-pot clavulanate] Other (See Comments) 05/22/2013  . Indocin [indomethacin] Other (See Comments) 05/22/2013  . Lodine [etodolac] Other (See Comments) 05/22/2013  . Methotrexate derivatives Other (See Comments) 05/22/2013  . Gold Rash 11/02/2014  . Zantac [ranitidine hcl] Rash 05/22/2013    Review of Systems:    All systems reviewed and negative except where noted in HPI.   Physical Exam:  Vital signs in last 24 hours: Temp:  [98 F (36.7 C)-99.1 F (37.3 C)] 98.7 F (37.1 C) (04/11 1318) Pulse Rate:  [74-106] 99 (04/11 1318) Resp:  [12-20] 16 (04/11 0424) BP: (123-157)/(47-77) 157/62 (04/11 1318) SpO2:  [88 %-100 %] 88 % (04/11 1318)   General:   Pleasant, cooperative in NAD Head:  Normocephalic and atraumatic. Eyes:   No icterus.   Conjunctiva pink. PERRLA. Ears:  Normal auditory acuity. Neck:  Supple; no masses or thyroidomegaly Lungs: Respirations even and unlabored. Lungs clear to auscultation bilaterally.   No wheezes, crackles, or rhonchi.  Heart:  Regular rate and rhythm;  Without murmur, clicks, rubs or gallops Abdomen:  Soft, nondistended, diffuse tenderness.  Decreased bowel sounds. No appreciable masses or hepatomegaly.  No rebound  or guarding.  Rectal:  Not performed. Msk:  Symmetrical without gross deformities.  Strength  Extremities:  Without edema, cyanosis or clubbing. Neurologic:  Alert and oriented x3;  grossly normal neurologically. Skin:  Intact without significant lesions or rashes. Cervical Nodes:  No significant cervical adenopathy. Psych:  Alert and cooperative. Normal affect.  LAB RESULTS: Recent Labs    06/17/19 1040 06/18/19 1328  WBC 13.3* 8.8  HGB 14.0 12.5  HCT 42.6 39.6  PLT 215 166   BMET Recent Labs    06/17/19 1040 06/18/19 0002 06/18/19 0720  NA 142  --  143  K 2.9* 3.9 3.7  CL 103  --  107  CO2 26  --  25  GLUCOSE 161*  --  89  BUN 23  --  18  CREATININE 0.80  --  0.75  CALCIUM 8.7*  --  7.9*   LFT Recent Labs    06/17/19 1040  PROT 6.4*  ALBUMIN 4.1  AST 20  ALT 20  ALKPHOS 64  BILITOT 0.8   PT/INR No results for input(s): LABPROT, INR in the last 72 hours.  STUDIES: CT Abdomen Pelvis W Contrast  Result Date: 06/17/2019 CLINICAL DATA:  85 year old female with acute abdominal pain. EXAM: CT ABDOMEN AND PELVIS WITH CONTRAST TECHNIQUE: Multidetector CT imaging of the abdomen and pelvis was performed using the standard protocol following bolus administration of intravenous contrast. CONTRAST:  123m OMNIPAQUE IOHEXOL 300 MG/ML  SOLN COMPARISON:  CT abdomen pelvis dated 02/15/2015. FINDINGS: Lower chest: There are bibasilar atelectasis/scarring. There is background of emphysema. Three vessel coronary vascular calcification. There is no intra-abdominal free air. There is a small free fluid in the upper abdomen and within the pelvis. Hepatobiliary: The liver is unremarkable. No intrahepatic biliary ductal dilatation. The gallbladder is unremarkable. Pancreas: Unremarkable. No pancreatic ductal dilatation or surrounding inflammatory changes. Spleen: Normal in size without focal abnormality. Adrenals/Urinary Tract: The adrenal glands are unremarkable. Mild bilateral renal  parenchyma atrophy and cortical scarring. There is no hydronephrosis on either side. There is symmetric enhancement and excretion of contrast by both kidneys. The visualized ureters and urinary bladder appear unremarkable. Stomach/Bowel: There is postsurgical changes of  distal small bowel resection with anastomotic suture. There is mild thickening of the terminal ileum distal to the suture. There is dilatation of the loops of small bowel proximal to the anastomosis in keeping with a degree of obstruction. In addition there is apparent thickening of a segment of the small bowel in the left lower abdomen (series 2, image 72 and coronal series 5 image 51). There is mild dilatation of the loop of small bowel proximal to the segments which may be related to adhesion. There is colonic diverticulosis without active inflammatory changes. Vascular/Lymphatic: There is advanced aortoiliac atherosclerotic disease. The IVC is unremarkable. No portal venous gas. There is no adenopathy. Reproductive: Hysterectomy. Other: Small fat containing umbilical hernia. Musculoskeletal: Osteopenia with degenerative changes of the spine. No acute osseous pathology. IMPRESSION: 1. Postsurgical changes of distal small bowel resection with findings concerning for a degree of obstruction proximal to the anastomosis. In addition additional segments of apparent small bowel thickening with mild dilatation of the proximal adjacent loops may represent additional areas of obstruction possibly secondary to adhesion. Small-bowel series may provide better evaluation. Colonic diverticulosis. 2. Small ascites. 3. Aortic Atherosclerosis (ICD10-I70.0) and Emphysema (ICD10-J43.9). Electronically Signed   By: Anner Crete M.D.   On: 06/17/2019 17:09      Impression / Plan:   Assessment: Active Problems:   SBO (small bowel obstruction) (HCC)   KALAYNA NOY is a 85 y.o. y/o female with with a history of Crohn's disease and a resection 8 years  ago who presented with small bowel obstruction with thickening of the bowel with mild dilation of proximal loops which may be secondary to adhesions versus Crohn's disease.  The patient has not had a Crohn's exacerbation in 8 years and her first flare presenting as an obstruction would be surprising.  The patient is due for her next Humira treatment now.  She is on 5 mg of prednisone a day which may be due to her COPD or may be for her Crohn's it is not clear.  Plan:  The patient will be treated with Solu-Medrol 60 mg a day IV.  She will also have her blood sent off for CRP and stool sent for fecal calprotectin.  The patient will also have her blood sent for adalimumab levels and antibodies.  Her levels should be low since she is due for a treatment now but her antibodies will tell us if she is in need of switching her medication if she has positive antibodies.  Dr. Marius Ditch will be taking over her care tomorrow and may have further recommendations since she is our IBD specialist.  The patient has been explained the plan and agrees with it.  Thank you for involving me in the care of this patient.      LOS: 1 day   Raven Lame, MD  06/18/2019, 3:04 PM Pager [phone] 7am-5pm  Check AMION for 5pm -7am coverage and on weekends   Note: This dictation was prepared with Dragon dictation along with smaller phrase technology. Any transcriptional errors that result from this process are unintentional.

## 2019-06-18 NOTE — Consult Note (Signed)
Patient ID: Raven [name], female   DOB: [birth year], 85 y.o.   MRN: [id number]  HPI Raven [name] is a 85 y.o. female seen in consultation at the request of Dr. Posey Pronto.  She has multiple comorbidities including long standing history of Crohn's disease being managed with Humira and apparently steroids.  She did have small bowel  ( 10 inches) resection 2014  by Dr. Tamala Julian for what appears to be a stricture.  She reports having the abdominal pain that is diffuse sharp moderate to severe in intensity and that is intermittent.  Also had associated nausea and vomiting and dry heaving.  Currently there is one bowel movement reported in the chart yesterday.  She denies passing any gas.  No fevers no chills no shortness of breath.  CT scan personally reviewed showing evidence of dilated loops of small bowel.  There is thickening of the small bowel  proximal  to the anastomosis.  There is no free air there is no pneumatosis.  CBC and BMP were completely normal. I also perform another KUB today without evidence of pneumatosis or free air.  HPI  Past Medical History:  Diagnosis Date  . Anemia   . Arthritis   . Asthma   . Complication of anesthesia   . COPD (chronic obstructive pulmonary disease) ([facility])   . Crohn's disease ([place])   . Depression    after death of husband  . Hyperlipidemia   . Hypertension   . Myocardial infarction ([place])   . Osteopenia   . Peptic ulcer disease   . Pneumonia   . PONV (postoperative nausea and vomiting)     Past Surgical History:  Procedure Laterality Date  . ABDOMINAL SURGERY    . CARDIAC CATHETERIZATION     with stent  . COLONOSCOPY WITH PROPOFOL N/A 09/27/2014   Procedure: COLONOSCOPY WITH PROPOFOL;  Surgeon: [name], MD;  Location: [facility] ENDOSCOPY;  Service: Gastroenterology;  Laterality: N/A;  . EYE SURGERY     bilateral cataract surgeries  . SHOULDER SURGERY Right    x 2   . SMALL INTESTINE SURGERY      Family History  Problem Relation Age of Onset  .  Hypertension Other     Social History Social History   Tobacco Use  . Smoking status: Former Smoker    Quit date: 1974    Years since quitting: 47.3  . Smokeless tobacco: Never Used  Substance Use Topics  . Alcohol use: Yes    Comment: occassional  . Drug use: No    Allergies  Allergen Reactions  . Penicillins Other (See Comments)    Reaction:  Unknown  Has patient had a PCN reaction causing immediate rash, facial/tongue/throat swelling, SOB or lightheadedness with hypotension: unknown Has patient had a PCN reaction causing severe rash involving mucus membranes or skin necrosis: unknown Has patient had a PCN reaction that required hospitalization No Has patient had a PCN reaction occurring within the last 10 years: No If all of the above answers are "NO", then may proceed with Cephalosporin use.   . Augmentin [Amoxicillin-Pot Clavulanate] Other (See Comments)    Reaction:  Unknown   . Indocin [Indomethacin] Other (See Comments)    Reaction:  Unknown   . Lodine [Etodolac] Other (See Comments)    Reaction:   GI upset   . Methotrexate Derivatives Other (See Comments)    Reaction:  GI upset   . Gold Rash  . Zantac [Ranitidine Hcl] Rash    Current  Facility-Administered Medications  Medication Dose Route Frequency Provider Last Rate Last Admin  . 0.9 % NaCl with KCl 20 mEq/ L  infusion   Intravenous Continuous Fritzi Mandes, MD 100 mL/hr at 06/18/19 1125 New Bag at 06/18/19 1125  . acetaminophen (TYLENOL) tablet 650 mg  650 mg Oral Q6H PRN Fritzi Mandes, MD   650 mg at 06/18/19 0915   Or  . acetaminophen (TYLENOL) suppository 650 mg  650 mg Rectal Q6H PRN Fritzi Mandes, MD      . alum & mag hydroxide-simeth (MAALOX/MYLANTA) 200-200-20 MG/5ML suspension 15 mL  15 mL Oral Q6H PRN Fritzi Mandes, MD      . enoxaparin (LOVENOX) injection 40 mg  40 mg Subcutaneous Q24H Fritzi Mandes, MD   40 mg at 06/17/19 2006  . hydrALAZINE (APRESOLINE) injection 10 mg  10 mg Intravenous Q6H PRN Fritzi Mandes, MD      . ipratropium-albuterol (DUONEB) 0.5-2.5 (3) MG/3ML nebulizer solution 3 mL  3 mL Nebulization Q6H PRN Fritzi Mandes, MD      . methylPREDNISolone sodium succinate (SOLU-MEDROL) 125 mg/2 mL injection 60 mg  60 mg Intravenous Daily Lucilla Lame, MD   60 mg at 06/18/19 1637  . morphine 2 MG/ML injection 2 mg  2 mg Intravenous Q4H PRN Fritzi Mandes, MD   2 mg at 06/18/19 1637  . ondansetron (ZOFRAN) tablet 4 mg  4 mg Oral Q6H PRN Fritzi Mandes, MD       Or  . ondansetron [facility]) injection 4 mg  4 mg Intravenous Q6H PRN Fritzi Mandes, MD      . pantoprazole (PROTONIX) injection 40 mg  40 mg Intravenous Q24H Fritzi Mandes, MD   40 mg at 06/17/19 1904     Review of Systems Full ROS  was asked and was negative except for the information on the HPI  Physical Exam Blood pressure (!) 157/62, pulse 99, temperature 98.7 F (37.1 C), temperature source Oral, resp. rate 16, height 5' 6"  (1.676 m), weight 77.1 kg, SpO2 (!) 88 %. CONSTITUTIONAL: NAD EYES: Pupils are equal, round, and reactive to light, Sclera are non-icteric. EARS, NOSE, MOUTH AND THROAT: The oropharynx is clear. The oral mucosa is pink and moist. Hearing is intact to voice. LYMPH NODES:  Lymph nodes in the neck are normal. RESPIRATORY:  Lungs are clear. There is normal respiratory effort, with equal breath sounds bilaterally, and without pathologic use of accessory muscles. CARDIOVASCULAR: Heart is regular without murmurs, gallops, or rubs. GI: The abdomen is  soft, with diffuse tenderness.  There is some voluntary rebound but no overt peritonitis . there are no palpable masses. There is no hepatosplenomegaly. There are normal bowel sounds in all quadrants. GU: Rectal deferred.   MUSCULOSKELETAL: Normal muscle strength and tone. No cyanosis or edema.   SKIN: Turgor is good and there are no pathologic skin lesions or ulcers. NEUROLOGIC: Motor and sensation is grossly normal. Cranial nerves are grossly intact. PSYCH:  Oriented to  person, place and time. Affect is normal.  Data Reviewed  I have personally reviewed the patient's imaging, laboratory findings and medical records.    Assessment/Plan 85 year old female with known history of Crohn's disease now presents with abdominal pain nausea vomiting consistent with partial small bowel obstruction vs  questionable Crohn's exacerbation.  Stricture it is also high in the differential. Currently there is no evidence of free air peritonitis. Appreciate GI input and they will start her on systemic steroids.  I also ordered NG tube for decompression.  We will follow with daily x-rays and serial abdominal exams.  I would like to give her a little bit of time for the steroids to work.  We may have to do a Gastrografin challenge if symptoms persist.  She understands that if she continues to deteriorate may need an exploration.  Were not quite there to the point.  Caroleen Hamman, MD FACS General Surgeon 06/18/2019, 5:12 PM

## 2019-06-19 ENCOUNTER — Inpatient Hospital Stay: Payer: Medicare Other

## 2019-06-19 DIAGNOSIS — K50012 Crohn's disease of small intestine with intestinal obstruction: Principal | ICD-10-CM

## 2019-06-19 DIAGNOSIS — K56609 Unspecified intestinal obstruction, unspecified as to partial versus complete obstruction: Secondary | ICD-10-CM

## 2019-06-19 DIAGNOSIS — J9611 Chronic respiratory failure with hypoxia: Secondary | ICD-10-CM

## 2019-06-19 DIAGNOSIS — J449 Chronic obstructive pulmonary disease, unspecified: Secondary | ICD-10-CM

## 2019-06-19 DIAGNOSIS — J9621 Acute and chronic respiratory failure with hypoxia: Secondary | ICD-10-CM

## 2019-06-19 LAB — BASIC METABOLIC PANEL
Anion gap: 13 (ref 5–15)
BUN: 15 mg/dL (ref 8–23)
CO2: 22 mmol/L (ref 22–32)
Calcium: 8.3 mg/dL — ABNORMAL LOW (ref 8.9–10.3)
Chloride: 107 mmol/L (ref 98–111)
Creatinine, Ser: 0.72 mg/dL (ref 0.44–1.00)
GFR calc Af Amer: >60 mL/min (ref >60)
GFR calc non Af Amer: >60 mL/min (ref >60)
Glucose, Bld: 136 mg/dL — ABNORMAL HIGH (ref 70–99)
Potassium: 4.2 mmol/L (ref 3.5–5.1)
Sodium: 142 mmol/L (ref 135–145)

## 2019-06-19 LAB — GLUCOSE, CAPILLARY
Glucose-Capillary: 131 mg/dL — ABNORMAL HIGH (ref 70–99)
Glucose-Capillary: 122 mg/dL — ABNORMAL HIGH (ref 70–99)
Glucose-Capillary: 106 mg/dL — ABNORMAL HIGH (ref 70–99)

## 2019-06-19 LAB — C-REACTIVE PROTEIN: CRP: 7.4 mg/dL — ABNORMAL HIGH (ref <1.0)

## 2019-06-19 MED ORDER — POLYVINYL ALCOHOL 1.4 % OP SOLN
1.0000 [drp] | OPHTHALMIC | Status: DC | PRN
  Filled 2019-06-19: qty 15

## 2019-06-19 MED ORDER — ALPRAZOLAM 0.25 MG PO TABS
0.2500 mg | ORAL_TABLET | Freq: Once | ORAL | Status: AC | PRN
  Administered 2019-06-19: 12:00:00 0.25 mg via ORAL
  Filled 2019-06-19: qty 1

## 2019-06-19 MED ORDER — SODIUM CHLORIDE 0.9 % IV SOLN: INTRAVENOUS | Status: DC

## 2019-06-19 NOTE — Consult Note (Signed)
[place] for Electrolyte Monitoring and Replacement   Recent Labs: Potassium (mmol/L)  Date Value  06/19/2019 4.2  06/09/2014 3.4 (L)   Magnesium (mg/dL)  Date Value  06/17/2019 1.8  08/29/2011 1.8   Calcium (mg/dL)  Date Value  06/19/2019 8.3 (L)   Calcium, Total (mg/dL)  Date Value  06/09/2014 7.7 (L)   Albumin (g/dL)  Date Value  06/17/2019 4.1  06/10/2014 2.9 (L)   Sodium (mmol/L)  Date Value  06/19/2019 142  06/09/2014 137    Assessment: 85 y.o. female with persistent small bowel obstruction with unclear etiology: post-surgical adhesions vs Crohn's exacerbation vs stricture of previous anastomosis.  Goal of Therapy:  Electrolytes WNL  Plan:   no further replenishment warranted at this time  Follow-up with am labs  Dallie Piles ,PharmD Clinical Pharmacist 06/19/2019 7:23 AM

## 2019-06-19 NOTE — Progress Notes (Signed)
Raven Darby, MD [address]  [place]  [place], [place] [address]  Main: [phone]  Fax: [phone] Pager: [phone]   Subjective: Patient is uncomfortable with NG tube.  She had 2 bowel movements today.  She reports her abdominal distention is better.  She is more symptomatic from having an NG tube in place.  Patient's daughter is bedside.  Patient denies having bowel obstruction since her bowel resection.  She denies having colonoscopy since 2016   Objective: Vital signs in last 24 hours: Vitals:   06/19/19 0430 06/19/19 0904 06/19/19 0906 06/19/19 1209  BP: (!) 169/82 (!) 180/95 (!) 202/91 (!) 187/87  Pulse: 100 (!) 105 (!) 107 (!) 107  Resp: 20 18    Temp: 97.7 F (36.5 C) 98.9 F (37.2 C)  98.5 F (36.9 C)  TempSrc: Oral Oral  Oral  SpO2: 97% 96% 97% 95%  Weight:      Height:       Weight change:   Intake/Output Summary (Last 24 hours) at 06/19/2019 1614 Last data filed at 06/18/2019 1830 Gross per 24 hour  Intake --  Output 0 ml  Net 0 ml     Exam: Heart:: Tachycardia, regular rhythm Lungs: normal and clear to auscultation Abdomen: soft, nontender, mildly distended, normal bowel sounds   Lab Results: CBC Latest Ref Rng & Units 06/18/2019 06/17/2019 10/21/2018  WBC 4.0 - 10.5 K/uL 8.8 13.3(H) 8.7  Hemoglobin 12.0 - 15.0 g/dL 12.5 14.0 11.7(L)  Hematocrit 36.0 - 46.0 % 39.6 42.6 36.5  Platelets 150 - 400 K/uL 166 215 171   CMP Latest Ref Rng & Units 06/19/2019 06/18/2019 06/18/2019  Glucose 70 - 99 mg/dL 136(H) 89 -  BUN 8 - 23 mg/dL 15 18 -  Creatinine 0.44 - 1.00 mg/dL 0.72 0.75 -  Sodium 135 - 145 mmol/L 142 143 -  Potassium 3.5 - 5.1 mmol/L 4.2 3.7 3.9  Chloride 98 - 111 mmol/L 107 107 -  CO2 22 - 32 mmol/L 22 25 -  Calcium 8.9 - 10.3 mg/dL 8.3(L) 7.9(L) -  Total Protein 6.5 - 8.1 g/dL - - -  Total Bilirubin 0.3 - 1.2 mg/dL - - -  Alkaline Phos 38 - 126 U/L - - -  AST 15 - 41 U/L - - -  ALT 0 - 44 U/L - - -    Micro  Results: Recent Results (from the past 240 hour(s))  SARS CORONAVIRUS 2 (TAT 6-24 HRS) Nasopharyngeal Nasopharyngeal Swab     Status: None   Collection Time: 06/17/19  5:17 PM   Specimen: Nasopharyngeal Swab  Result Value Ref Range Status   SARS Coronavirus 2 NEGATIVE NEGATIVE Final    Comment: (NOTE) SARS-CoV-2 target nucleic acids are NOT DETECTED. The SARS-CoV-2 RNA is generally detectable in upper and lower respiratory specimens during the acute phase of infection. Negative results do not preclude SARS-CoV-2 infection, do not rule out co-infections with other pathogens, and should not be used as the sole basis for treatment or other patient management decisions. Negative results must be combined with clinical observations, patient history, and epidemiological information. The expected result is Negative. Fact Sheet for Patients: SugarRoll.be Fact Sheet for Healthcare Providers: https://www.woods-mathews.com/ This test is not yet approved or cleared by the [place] FDA and  has been authorized for detection and/or diagnosis of SARS-CoV-2 by FDA under an Emergency Use Authorization (EUA). This EUA will remain  in effect (meaning this test can be used) for the duration of  the COVID-19 declaration under Section 56 4(b)(1) of the Act, 21 U.S.C. section 360bbb-3(b)(1), unless the authorization is terminated or revoked sooner. Performed at [facility], [place] [address], [place], [place] [address]    Studies/Results: DG Abd 1 View  Result Date: 06/19/2019 CLINICAL DATA:  Small bowel obstruction EXAM: ABDOMEN - 1 VIEW COMPARISON:  06/18/2019 FINDINGS: NG tube tip is in the proximal stomach. Continued mildly dilated small bowel loops in the left abdomen. Gas within decompressed colon. No free air organomegaly. IMPRESSION: NG tube tip in the stomach. Continued mildly prominent small bowel loops in the left lower abdomen. Electronically  Signed   By: Rolm Baptise M.D.   On: 06/19/2019 10:03   DG Abd 1 View  Result Date: 06/18/2019 CLINICAL DATA:  NG tube EXAM: ABDOMEN - 1 VIEW COMPARISON:  06/18/2019 at 1258 hours FINDINGS: Enteric tube terminates in the gastric fundus with side port at the GE junction. IMPRESSION: Enteric tube terminates in the gastric fundus. Electronically Signed   By: Julian Hy M.D.   On: 06/18/2019 17:27   CT Abdomen Pelvis W Contrast  Result Date: 06/17/2019 CLINICAL DATA:  85 year old female with acute abdominal pain. EXAM: CT ABDOMEN AND PELVIS WITH CONTRAST TECHNIQUE: Multidetector CT imaging of the abdomen and pelvis was performed using the standard protocol following bolus administration of intravenous contrast. CONTRAST:  157m OMNIPAQUE IOHEXOL 300 MG/ML  SOLN COMPARISON:  CT abdomen pelvis dated 02/15/2015. FINDINGS: Lower chest: There are bibasilar atelectasis/scarring. There is background of emphysema. Three vessel coronary vascular calcification. There is no intra-abdominal free air. There is a small free fluid in the upper abdomen and within the pelvis. Hepatobiliary: The liver is unremarkable. No intrahepatic biliary ductal dilatation. The gallbladder is unremarkable. Pancreas: Unremarkable. No pancreatic ductal dilatation or surrounding inflammatory changes. Spleen: Normal in size without focal abnormality. Adrenals/Urinary Tract: The adrenal glands are unremarkable. Mild bilateral renal parenchyma atrophy and cortical scarring. There is no hydronephrosis on either side. There is symmetric enhancement and excretion of contrast by both kidneys. The visualized ureters and urinary bladder appear unremarkable. Stomach/Bowel: There is postsurgical changes of distal small bowel resection with anastomotic suture. There is mild thickening of the terminal ileum distal to the suture. There is dilatation of the loops of small bowel proximal to the anastomosis in keeping with a degree of obstruction. In  addition there is apparent thickening of a segment of the small bowel in the left lower abdomen (series 2, image 72 and coronal series 5 image 51). There is mild dilatation of the loop of small bowel proximal to the segments which may be related to adhesion. There is colonic diverticulosis without active inflammatory changes. Vascular/Lymphatic: There is advanced aortoiliac atherosclerotic disease. The IVC is unremarkable. No portal venous gas. There is no adenopathy. Reproductive: Hysterectomy. Other: Small fat containing umbilical hernia. Musculoskeletal: Osteopenia with degenerative changes of the spine. No acute osseous pathology. IMPRESSION: 1. Postsurgical changes of distal small bowel resection with findings concerning for a degree of obstruction proximal to the anastomosis. In addition additional segments of apparent small bowel thickening with mild dilatation of the proximal adjacent loops may represent additional areas of obstruction possibly secondary to adhesion. Small-bowel series may provide better evaluation. Colonic diverticulosis. 2. Small ascites. 3. Aortic Atherosclerosis (ICD10-I70.0) and Emphysema (ICD10-J43.9). Electronically Signed   By: AAnner CreteM.D.   On: 06/17/2019 17:09   DG ABD ACUTE 2+V W 1V CHEST  Result Date: 06/18/2019 CLINICAL DATA:  Small bowel obstruction. History of Crohn's  disease. EXAM: DG ABDOMEN ACUTE W/ 1V CHEST COMPARISON:  Abdominal films 05/14/2012 and CT 06/17/2019 as well as chest x-ray 10/21/2018 FINDINGS: Lungs are adequately inflated demonstrate stable moderate elevation of the right hemidiaphragm. Minimal prominence of the perihilar vessels suggesting minimal vascular congestion. No lobar consolidation or effusion. Remainder of the chest is unchanged. Bowel gas pattern demonstrates persistence of several air-filled mildly dilated small bowel loops over the lower mid to right abdomen. Air is present within the colon. No free peritoneal air. Remainder the  exam is unchanged. IMPRESSION: 1. Persistent air-filled mildly dilated small bowel loops over the lower mid to right abdomen. Air within the colon. Findings may be due to early/partial small bowel obstruction. 2. Findings suggesting mild vascular congestion. Chronic elevation right hemidiaphragm. Electronically Signed   By: Marin Olp M.D.   On: 06/18/2019 15:35   Medications:  I have reviewed the patient's current medications. Prior to Admission:  Medications Prior to Admission  Medication Sig Dispense Refill Last Dose  . Adalimumab (HUMIRA) 40 MG/0.8ML PSKT Inject 40 mg into the skin every 14 (fourteen) days.    Past Month at Unknown time  . albuterol (PROVENTIL HFA;VENTOLIN HFA) 108 (90 BASE) MCG/ACT inhaler Inhale 2 puffs into the lungs every 6 (six) hours as needed for wheezing or shortness of breath.   PRN at PRN  . amitriptyline (ELAVIL) 10 MG tablet Take 20 mg by mouth at bedtime.   06/16/2019 at Unknown time  . amLODipine-benazepril (LOTREL) 5-20 MG per capsule Take 1 capsule by mouth daily.    06/16/2019 at Unknown time  . ANORO ELLIPTA 62.5-25 MCG/INH AEPB Inhale 1 puff into the lungs daily.   06/16/2019 at Unknown time  . aspirin EC 81 MG tablet Take 81 mg by mouth daily.   06/16/2019 at Unknown time  . atorvastatin (LIPITOR) 80 MG tablet Take 80 mg by mouth at bedtime.    06/16/2019 at Unknown time  . azithromycin (ZITHROMAX) 250 MG tablet Take as directed 2 each 0 06/16/2019 at Unknown time  . benzonatate (TESSALON) 100 MG capsule Take by mouth 3 (three) times daily as needed for cough.   PRN at PRN  . Calcium-Phosphorus-Vitamin D (CITRACAL +D3 PO) Take 1 tablet by mouth daily.   06/16/2019 at Unknown time  . clopidogrel (PLAVIX) 75 MG tablet Take 75 mg by mouth daily.   06/16/2019 at Unknown time  . Cyanocobalamin (B-12 PO) Take 1 tablet by mouth daily. Triple B-12   06/16/2019 at Unknown time  . esomeprazole (NEXIUM) 40 MG capsule Take 40 mg by mouth daily.   06/16/2019 at Unknown time  .  Fluticasone-Salmeterol (ADVAIR) 250-50 MCG/DOSE AEPB Inhale 1 puff into the lungs 2 (two) times daily.   06/16/2019 at Unknown time  . furosemide (LASIX) 20 MG tablet Take 1 tablet (20 mg total) by mouth daily. 30 tablet 0 06/16/2019 at Unknown time  . HYDROMET 5-1.5 MG/5ML syrup Take 5 mLs by mouth every 6 (six) hours as needed.   06/16/2019 at Unknown time  . leflunomide (ARAVA) 10 MG tablet Take 10 mg by mouth daily.   06/16/2019 at Unknown time  . mesalamine (PENTASA) 500 MG CR capsule Take 1,000 mg by mouth 2 (two) times daily.   06/16/2019 at Unknown time  . metoprolol succinate (TOPROL-XL) 25 MG 24 hr tablet Take 25 mg by mouth daily.   06/16/2019 at Unknown time  . Multiple Vitamin (MULTIVITAMIN WITH MINERALS) TABS tablet Take 1 tablet by mouth daily.   06/16/2019 at  Unknown time  . nitroGLYCERIN (NITROSTAT) 0.4 MG SL tablet Place 0.4 mg under the tongue every 5 (five) minutes as needed for chest pain.   prn at prn  . potassium chloride (K-DUR,KLOR-CON) 10 MEQ tablet Take 10 mEq by mouth daily.    06/16/2019 at Unknown time  . predniSONE (DELTASONE) 10 MG tablet Take 50 mg daily--taper by 10 mg daily then stop 15 tablet 0 06/16/2019 at Unknown time  . risedronate (ACTONEL) 150 MG tablet Take 150 mg by mouth every 30 (thirty) days. with water on empty stomach, nothing by mouth or lie down for next 30 minutes.   06/16/2019 at Unknown time  . senna (SENOKOT) 8.6 MG tablet Take 2 tablets by mouth at bedtime.    PRN at PRN  . thiamine (VITAMIN B-1) 100 MG tablet Take 100 mg by mouth daily.   06/16/2019 at Unknown time  . vitamin E 400 UNIT capsule Take 400 Units by mouth daily.   06/16/2019 at Unknown time  . methylPREDNISolone (MEDROL DOSEPAK) 4 MG TBPK tablet Take 4 mg by mouth daily.     . potassium chloride (KLOR-CON) 10 MEQ tablet Take 10 mEq by mouth daily.      Scheduled: . enoxaparin (LOVENOX) injection  40 mg Subcutaneous Q24H  . methylPREDNISolone (SOLU-MEDROL) injection  60 mg Intravenous Daily  .  pantoprazole (PROTONIX) IV  40 mg Intravenous Q24H   Continuous: . sodium chloride 75 mL/hr at 06/19/19 0848   CBJ:[id number] **OR** acetaminophen, alum & mag hydroxide-simeth, hydrALAZINE, ipratropium-albuterol, morphine injection, ondansetron **OR** ondansetron (ZOFRAN) IV, polyvinyl alcohol Anti-infectives (From admission, onward)   None     Scheduled Meds: . enoxaparin (LOVENOX) injection  40 mg Subcutaneous Q24H  . methylPREDNISolone (SOLU-MEDROL) injection  60 mg Intravenous Daily  . pantoprazole (PROTONIX) IV  40 mg Intravenous Q24H   Continuous Infusions: . sodium chloride 75 mL/hr at 06/19/19 0848   PRN Meds:.acetaminophen **OR** acetaminophen, alum & mag hydroxide-simeth, hydrALAZINE, ipratropium-albuterol, morphine injection, ondansetron **OR** ondansetron (ZOFRAN) IV, polyvinyl alcohol   Assessment: Active Problems:   SBO (small bowel obstruction) ([facility])   Chronic respiratory failure with hypoxia ([facility])   Steroid-dependent COPD ([place])  History of Crohn's disease, s/p small bowel resection in 2014, maintained on Humira every 2 weeks Now admitted with partial small bowel obstruction  Plan: SBO: Patient is clinically improving Continue IV Solu-Medrol 60 mg daily Okay with clear liquid diet, clamp NG tube Okay to remove NG tube if patient is tolerating clear liquids Tentative plan to perform colonoscopy on Wednesday depending on how well she tolerates the bowel prep Long-term management of Crohn's to be determined based on the colonoscopy findings  Patient has history of CHF, EF of 45% as well as COPD, these have to be weighed in with long-term continuation of Humira  GI will follow along with you   LOS: 2 days   Adden Strout 06/19/2019, 4:14 PM

## 2019-06-19 NOTE — Progress Notes (Signed)
[facility] [place] NAME: Raven [name]    MR#:  [id number]  DATE OF BIRTH:  09/26/1934  SUBJECTIVE:  patient is more anxious with the NG tube. She feels discomfort feels she is not able to breathe although her sats are stable. Is having intermittent coughing. Daughter in the room. Answered most of her questions questions.  REVIEW OF SYSTEMS:   Review of Systems  Constitutional: Positive for malaise/fatigue. Negative for chills, fever and weight loss.  HENT: Negative for ear discharge, ear pain and nosebleeds.   Eyes: Negative for blurred vision, pain and discharge.  Respiratory: Positive for cough. Negative for sputum production, shortness of breath, wheezing and stridor.   Cardiovascular: Negative for chest pain, palpitations, orthopnea and PND.  Gastrointestinal: Positive for abdominal pain. Negative for diarrhea, nausea and vomiting.  Genitourinary: Negative for frequency and urgency.  Musculoskeletal: Negative for back pain and joint pain.  Neurological: Negative for sensory change, speech change, focal weakness and weakness.  Psychiatric/Behavioral: Negative for depression and hallucinations. The patient is not nervous/anxious.    Tolerating Diet:NPO except ice chips Tolerating PT:   DRUG ALLERGIES:   Allergies  Allergen Reactions  . Penicillins Other (See Comments)    Reaction:  Unknown  Has patient had a PCN reaction causing immediate rash, facial/tongue/throat swelling, SOB or lightheadedness with hypotension: unknown Has patient had a PCN reaction causing severe rash involving mucus membranes or skin necrosis: unknown Has patient had a PCN reaction that required hospitalization No Has patient had a PCN reaction occurring within the last 10 years: No If all of the above answers are "NO", then may proceed with Cephalosporin use.   . Augmentin [Amoxicillin-Pot Clavulanate] Other (See Comments)    Reaction:  Unknown   .  Indocin [Indomethacin] Other (See Comments)    Reaction:  Unknown   . Lodine [Etodolac] Other (See Comments)    Reaction:   GI upset   . Methotrexate Derivatives Other (See Comments)    Reaction:  GI upset   . Gold Rash  . Zantac [Ranitidine Hcl] Rash    VITALS:  Blood pressure (!) 187/87, pulse (!) 107, temperature 98.5 F (36.9 C), temperature source Oral, resp. rate 18, height 5' 6"  (1.676 m), weight 77.1 kg, SpO2 95 %.  PHYSICAL EXAMINATION:   Physical Exam  GENERAL:  85 y.o.-year-old patient lying in the bed with no acute distress. Obese EYES: Pupils equal, round, reactive to light and accommodation. No scleral icterus.   HEENT: Head atraumatic, normocephalic. Oropharynx and nasopharynx clear. NG+ NECK:  Supple, no jugular venous distention. No thyroid enlargement, no tenderness.  LUNGS: Normal breath sounds bilaterally, scattered wheezing+,no rales, rhonchi. No use of accessory muscles of respiration.  CARDIOVASCULAR: S1, S2 normal. No murmurs, rubs, or gallops.  ABDOMEN: Soft, mild diffuse tenderness, mild distended. Bowel sounds present. No organomegaly or mass.  EXTREMITIES: No cyanosis, clubbing or edema b/l.    NEUROLOGIC: Cranial nerves II through XII are intact. No focal Motor or sensory deficits b/l.   PSYCHIATRIC:  patient is alert and oriented x 3.  SKIN: No obvious rash, lesion, or ulcer.   LABORATORY PANEL:  CBC Recent Labs  Lab 06/18/19 1328  WBC 8.8  HGB 12.5  HCT 39.6  PLT 166    Chemistries  Recent Labs  Lab 06/17/19 1040 06/18/19 0002 06/19/19 0546  NA 142   < > 142  K 2.9*   < > 4.2  CL 103   < >  107  CO2 26   < > 22  GLUCOSE 161*   < > 136*  BUN 23   < > 15  CREATININE 0.80   < > 0.72  CALCIUM 8.7*   < > 8.3*  MG 1.8  --   --   AST 20  --   --   ALT 20  --   --   ALKPHOS 64  --   --   BILITOT 0.8  --   --    < > = values in this interval not displayed.   Cardiac Enzymes No results for input(s): TROPONINI in the last 168  hours. RADIOLOGY:  DG Abd 1 View  Result Date: 06/19/2019 CLINICAL DATA:  Small bowel obstruction EXAM: ABDOMEN - 1 VIEW COMPARISON:  06/18/2019 FINDINGS: NG tube tip is in the proximal stomach. Continued mildly dilated small bowel loops in the left abdomen. Gas within decompressed colon. No free air organomegaly. IMPRESSION: NG tube tip in the stomach. Continued mildly prominent small bowel loops in the left lower abdomen. Electronically Signed   By: Rolm Baptise M.D.   On: 06/19/2019 10:03   DG Abd 1 View  Result Date: 06/18/2019 CLINICAL DATA:  NG tube EXAM: ABDOMEN - 1 VIEW COMPARISON:  06/18/2019 at 1258 hours FINDINGS: Enteric tube terminates in the gastric fundus with side port at the GE junction. IMPRESSION: Enteric tube terminates in the gastric fundus. Electronically Signed   By: Julian Hy M.D.   On: 06/18/2019 17:27   CT Abdomen Pelvis W Contrast  Result Date: 06/17/2019 CLINICAL DATA:  85 year old female with acute abdominal pain. EXAM: CT ABDOMEN AND PELVIS WITH CONTRAST TECHNIQUE: Multidetector CT imaging of the abdomen and pelvis was performed using the standard protocol following bolus administration of intravenous contrast. CONTRAST:  173m OMNIPAQUE IOHEXOL 300 MG/ML  SOLN COMPARISON:  CT abdomen pelvis dated 02/15/2015. FINDINGS: Lower chest: There are bibasilar atelectasis/scarring. There is background of emphysema. Three vessel coronary vascular calcification. There is no intra-abdominal free air. There is a small free fluid in the upper abdomen and within the pelvis. Hepatobiliary: The liver is unremarkable. No intrahepatic biliary ductal dilatation. The gallbladder is unremarkable. Pancreas: Unremarkable. No pancreatic ductal dilatation or surrounding inflammatory changes. Spleen: Normal in size without focal abnormality. Adrenals/Urinary Tract: The adrenal glands are unremarkable. Mild bilateral renal parenchyma atrophy and cortical scarring. There is no hydronephrosis on  either side. There is symmetric enhancement and excretion of contrast by both kidneys. The visualized ureters and urinary bladder appear unremarkable. Stomach/Bowel: There is postsurgical changes of distal small bowel resection with anastomotic suture. There is mild thickening of the terminal ileum distal to the suture. There is dilatation of the loops of small bowel proximal to the anastomosis in keeping with a degree of obstruction. In addition there is apparent thickening of a segment of the small bowel in the left lower abdomen (series 2, image 72 and coronal series 5 image 51). There is mild dilatation of the loop of small bowel proximal to the segments which may be related to adhesion. There is colonic diverticulosis without active inflammatory changes. Vascular/Lymphatic: There is advanced aortoiliac atherosclerotic disease. The IVC is unremarkable. No portal venous gas. There is no adenopathy. Reproductive: Hysterectomy. Other: Small fat containing umbilical hernia. Musculoskeletal: Osteopenia with degenerative changes of the spine. No acute osseous pathology. IMPRESSION: 1. Postsurgical changes of distal small bowel resection with findings concerning for a degree of obstruction proximal to the anastomosis. In addition additional segments of apparent small  bowel thickening with mild dilatation of the proximal adjacent loops may represent additional areas of obstruction possibly secondary to adhesion. Small-bowel series may provide better evaluation. Colonic diverticulosis. 2. Small ascites. 3. Aortic Atherosclerosis (ICD10-I70.0) and Emphysema (ICD10-J43.9). Electronically Signed   By: Anner Crete M.D.   On: 06/17/2019 17:09   DG ABD ACUTE 2+V W 1V CHEST  Result Date: 06/18/2019 CLINICAL DATA:  Small bowel obstruction. History of Crohn's disease. EXAM: DG ABDOMEN ACUTE W/ 1V CHEST COMPARISON:  Abdominal films 05/14/2012 and CT 06/17/2019 as well as chest x-ray 10/21/2018 FINDINGS: Lungs are  adequately inflated demonstrate stable moderate elevation of the right hemidiaphragm. Minimal prominence of the perihilar vessels suggesting minimal vascular congestion. No lobar consolidation or effusion. Remainder of the chest is unchanged. Bowel gas pattern demonstrates persistence of several air-filled mildly dilated small bowel loops over the lower mid to right abdomen. Air is present within the colon. No free peritoneal air. Remainder the exam is unchanged. IMPRESSION: 1. Persistent air-filled mildly dilated small bowel loops over the lower mid to right abdomen. Air within the colon. Findings may be due to early/partial small bowel obstruction. 2. Findings suggesting mild vascular congestion. Chronic elevation right hemidiaphragm. Electronically Signed   By: Marin Olp M.D.   On: 06/18/2019 15:35   ASSESSMENT AND PLAN:   Kandace Elrod  is a 85 y.o. female with a known history of Crohn's disease status post small bowel enrterectomy about eight years ago, COPD on home oxygen, depression, hyperlipidemia, DJD, history of peptic ulcer disease comes to the emergency room after she started having vomiting times two last night with abdominal pain.  1. Small bowel obstruction/ileus adhesions vs crohnz exacerbation -presented with abdominal pain, vomiting times two, dry heaves and abnormal CT abdomen -NPO except ice chips -IV fluid -surgical consultation with Dr. Dahlia Byes --NG placed -PRN antiemetics, PRN pain meds -GI--dr vanga. Pt on IV solumederol  2. Chronic respiratory failure with COPD on chronic home oxygen -patient currently is 95% on 3 L. -As needed nebulizer. Patient is not in respiratory distress -prednisone 5 mg po daily--follows with dr Raul Del  3. Hypokalemia -K repleted  4. History of of Crohn's disease -on Humira (last dose 2 weeks ago)  5. HTN -will resume BB, Loterel when able to take po -prn IV hydralazine  6.h/o PUD -cont IV PPI  7.HL -on Statins   Family  Communication : spoke with daughter Ms. Davis in the room  Consults : surgery, GI Code Status : DNR prior to admission confirmed with patient DVT prophylaxis : Lovenox Discharge Disposition :from home patient is currently getting for small bowel obstruction. Cannot anticipate discharge date yet. TOC for d/c planning PT pending   TOTAL TIME TAKING CARE OF THIS PATIENT: *30* minutes.  >50% time spent on counselling and coordination of care  Note: This dictation was prepared with Dragon dictation along with smaller phrase technology. Any transcriptional errors that result from this process are unintentional.  Fritzi Mandes M.D    [facility]   CC: Primary care physician; Baxter Hire, MDPatient ID: Raven [name], female   DOB: 12/25/1934, 85 y.o.   MRN: [id number]

## 2019-06-19 NOTE — Progress Notes (Signed)
[facility] SURGICAL PROGRESS NOTE (cpt [phone])  Hospital Day(s): 2.   Interval History: Patient seen and examined, no acute events or new complaints overnight. Patient reports she continues to have fairly generalized abdominal pain. No fever, chills, nausea, or emesis. Biggest complaint is NGT discomfort. No NGT output recorded. KUB pending this morning.   Review of Systems:  Constitutional: denies fever, chills  HEENT: denies cough or congestion  Respiratory: denies any shortness of breath  Cardiovascular: denies chest pain or palpitations  Gastrointestinal: + abdominal pain, denied N/V, or diarrhea/and bowel function as per interval history Genitourinary: denies burning with urination or urinary frequency   Vital signs in last 24 hours: [min-max] current  Temp:  [97.7 F (36.5 C)-99 F (37.2 C)] 97.7 F (36.5 C) (04/12 0430) Pulse Rate:  [99-109] 100 (04/12 0430) Resp:  [20] 20 (04/12 0430) BP: (138-189)/(62-82) 169/82 (04/12 0430) SpO2:  [88 %-99 %] 97 % (04/12 0430)     Height: 5' 6"  (167.6 cm) Weight: 77.1 kg BMI (Calculated): 27.45   Intake/Output last 2 shifts:  04/11 0701 - 04/12 0700 In: 962.1 [I.V.:962.1] Out: 0    Physical Exam:  Constitutional: alert, cooperative and no distress  HENT: normocephalic without obvious abnormality, NGT in palce Eyes: PERRL, EOM's grossly intact and symmetric  Respiratory: breathing non-labored at rest  Cardiovascular: regular rate and sinus rhythm  Gastrointestinal: Soft, generalized abdominal tenderness, mild distension, she does appear to have some rebound tenderness but it is difficult to ascertain. Musculoskeletal: no edema or wounds, motor and sensation grossly intact, NT    Labs:  CBC Latest Ref Rng & Units 06/18/2019 06/17/2019 10/21/2018  WBC 4.0 - 10.5 K/uL 8.8 13.3(H) 8.7  Hemoglobin 12.0 - 15.0 g/dL 12.5 14.0 11.7(L)  Hematocrit 36.0 - 46.0 % 39.6 42.6 36.5  Platelets 150 - 400 K/uL 166 215 171   CMP  Latest Ref Rng & Units 06/19/2019 06/18/2019 06/18/2019  Glucose 70 - 99 mg/dL 136(H) 89 -  BUN 8 - 23 mg/dL 15 18 -  Creatinine 0.44 - 1.00 mg/dL 0.72 0.75 -  Sodium 135 - 145 mmol/L 142 143 -  Potassium 3.5 - 5.1 mmol/L 4.2 3.7 3.9  Chloride 98 - 111 mmol/L 107 107 -  CO2 22 - 32 mmol/L 22 25 -  Calcium 8.9 - 10.3 mg/dL 8.3(L) 7.9(L) -  Total Protein 6.5 - 8.1 g/dL - - -  Total Bilirubin 0.3 - 1.2 mg/dL - - -  Alkaline Phos 38 - 126 U/L - - -  AST 15 - 41 U/L - - -  ALT 0 - 44 U/L - - -     Imaging studies: KUB pending for today   Assessment/Plan: (ICD-10's: K84.609) 85 y.o. female with persistent small bowel obstruction with unclear etiology, consider post-surgical adhesions vs Crohn's exacerbation vs stricture of previous anastomosis.   - Will follow up KUB  - Continue NGT decompression; LIS; monitor and record output   - NPO + IVF resuscitation  - We will consider conservative management but this is not a clear picture. She understands that if she clinically deteriorates or fails conservative management then he will require intervention.    - Appreciate GI assistance for possible Crohn's management  - pain control prn; antiemetics prn  - monitor abdominal examination; on-going bowel function  - mobilization encouraged if feasible   - further management per primary service   All of the above findings and recommendations were discussed with the patient, and the medical team, and  all of patient's questions were answered to her expressed satisfaction.   -- Edison Simon, PA-C [facility] 06/19/2019, 7:21 AM [phone] M-F: 7am - 4pm

## 2019-06-20 LAB — RENAL FUNCTION PANEL
Albumin: 3.5 g/dL (ref 3.5–5.0)
Anion gap: 8 (ref 5–15)
BUN: 12 mg/dL (ref 8–23)
CO2: 26 mmol/L (ref 22–32)
Calcium: 8.3 mg/dL — ABNORMAL LOW (ref 8.9–10.3)
Chloride: 110 mmol/L (ref 98–111)
Creatinine, Ser: 0.61 mg/dL (ref 0.44–1.00)
GFR calc Af Amer: >60 mL/min (ref >60)
GFR calc non Af Amer: >60 mL/min (ref >60)
Glucose, Bld: 183 mg/dL — ABNORMAL HIGH (ref 70–99)
Phosphorus: 3.5 mg/dL (ref 2.5–4.6)
Potassium: 4.6 mmol/L (ref 3.5–5.1)
Sodium: 144 mmol/L (ref 135–145)

## 2019-06-20 LAB — MAGNESIUM: Magnesium: 2 mg/dL (ref 1.7–2.4)

## 2019-06-20 MED ORDER — PEG 3350-KCL-NA BICARB-NACL 420 G PO SOLR
4000.0000 mL | Freq: Once | ORAL | Status: AC
  Administered 2019-06-20: 4000 mL via ORAL
  Filled 2019-06-20: qty 4000

## 2019-06-20 MED ORDER — SODIUM CHLORIDE 0.9 % IV SOLN: INTRAVENOUS | Status: DC

## 2019-06-20 MED ORDER — AMITRIPTYLINE HCL 10 MG PO TABS
20.0000 mg | ORAL_TABLET | Freq: Every day | ORAL | Status: DC
  Filled 2019-06-20 (×2): qty 2

## 2019-06-20 MED ORDER — POLYETHYLENE GLYCOL 3350 17 GM/SCOOP PO POWD
1.0000 | Freq: Once | ORAL | Status: DC
  Filled 2019-06-20: qty 255

## 2019-06-20 MED ORDER — BENAZEPRIL HCL 20 MG PO TABS
20.0000 mg | ORAL_TABLET | Freq: Every day | ORAL | Status: DC
  Administered 2019-06-20 – 2019-06-21 (×2): 20 mg via ORAL
  Filled 2019-06-20 (×3): qty 1

## 2019-06-20 MED ORDER — AMLODIPINE BESY-BENAZEPRIL HCL 5-20 MG PO CAPS: 1.0000 | ORAL_CAPSULE | Freq: Every day | ORAL | Status: DC

## 2019-06-20 MED ORDER — AMLODIPINE BESYLATE 5 MG PO TABS
5.0000 mg | ORAL_TABLET | Freq: Every day | ORAL | Status: DC
  Administered 2019-06-20 – 2019-06-21 (×2): 5 mg via ORAL
  Filled 2019-06-20 (×2): qty 1

## 2019-06-20 MED ORDER — METOPROLOL SUCCINATE ER 25 MG PO TB24
25.0000 mg | ORAL_TABLET | Freq: Every day | ORAL | Status: DC
  Administered 2019-06-20 – 2019-06-21 (×2): 25 mg via ORAL
  Filled 2019-06-20 (×2): qty 1

## 2019-06-20 MED ORDER — MAGNESIUM CITRATE PO SOLN
1.0000 | Freq: Once | ORAL | Status: AC
  Administered 2019-06-20: 1 via ORAL
  Filled 2019-06-20: qty 296

## 2019-06-20 NOTE — Progress Notes (Signed)
IVF expired. Dr Posey Pronto said to discontinue IVF

## 2019-06-20 NOTE — Care Management Important Message (Signed)
Important Message  Patient Details  Name: Raven [name] MRN: [id number] Date of Birth: 1934-06-20   Medicare Important Message Given:  Yes     Dannette Barbara 06/20/2019, 11:40 AM

## 2019-06-20 NOTE — Consult Note (Signed)
[facility] for Electrolyte Monitoring and Replacement   Recent Labs: Potassium (mmol/L)  Date Value  06/20/2019 4.6  06/09/2014 3.4 (L)   Magnesium (mg/dL)  Date Value  06/20/2019 2.0  08/29/2011 1.8   Calcium (mg/dL)  Date Value  06/20/2019 8.3 (L)   Calcium, Total (mg/dL)  Date Value  06/09/2014 7.7 (L)   Albumin (g/dL)  Date Value  06/20/2019 3.5  06/10/2014 2.9 (L)   Phosphorus (mg/dL)  Date Value  06/20/2019 3.5   Sodium (mmol/L)  Date Value  06/20/2019 144  06/09/2014 137   Corrected Ca: 8.70 mg/dL  Assessment: 85 y.o. female with persistent small bowel obstruction with unclear etiology: post-surgical adhesions vs Crohn's exacerbation vs stricture of previous anastomosis.  Goal of Therapy:  Electrolytes WNL  Plan:   no further replenishment warranted at this time  Follow-up with am labs  Dallie Piles ,PharmD Clinical Pharmacist 06/20/2019 7:00 AM

## 2019-06-20 NOTE — Progress Notes (Signed)
[facility] SURGICAL PROGRESS NOTE (cpt [phone])  Hospital Day(s): 3.   Interval History: Patient seen and examined, no acute events or new complaints overnight. Patient reports she is feeling much better this morning. Abdominal pain improved, no nausea or emesis. Labs are unremarkable this morning. NGT was clamped yesterday after GI evaluation and she was given CLD. She has tolerated this well and NGT removed. Endorses multiple BMs yesterday. Possible plan for colonoscopy with GI Wednesday. No other new complaints.   Review of Systems:  Constitutional: denies fever, chills  HEENT: denies cough or congestion  Respiratory: denies any shortness of breath  Cardiovascular: denies chest pain or palpitations  Gastrointestinal: denies abdominal pain, N/V, or diarrhea/and bowel function as per interval history Genitourinary: denies burning with urination or urinary frequency  Vital signs in last 24 hours: [min-max] current  Temp:  [98.2 F (36.8 C)-98.9 F (37.2 C)] 98.2 F (36.8 C) (04/13 0432) Pulse Rate:  [100-107] 100 (04/13 0432) Resp:  [18-20] 20 (04/13 0432) BP: (168-202)/(76-95) 168/88 (04/13 0432) SpO2:  [80 %-97 %] 95 % (04/13 0432)     Height: 5' 6"  (167.6 cm) Weight: 77.1 kg BMI (Calculated): 27.45   Intake/Output last 2 shifts:  04/12 0701 - 04/13 0700 In: 542 [I.V.:542] Out: -    Physical Exam:  Constitutional: alert, cooperative and no distress  HENT: normocephalic without obvious abnormality Eyes: PERRL, EOM's grossly intact and symmetric  Respiratory: breathing non-labored at rest  Cardiovascular: regular rate and sinus rhythm  Gastrointestinal: Soft, minimal tenderness, non-distended, no rebound/guarding. Musculoskeletal: no edema or wounds, motor and sensation grossly intact, NT    Labs:  CBC Latest Ref Rng & Units 06/18/2019 06/17/2019 10/21/2018  WBC 4.0 - 10.5 K/uL 8.8 13.3(H) 8.7  Hemoglobin 12.0 - 15.0 g/dL 12.5 14.0 11.7(L)  Hematocrit 36.0 -  46.0 % 39.6 42.6 36.5  Platelets 150 - 400 K/uL 166 215 171   CMP Latest Ref Rng & Units 06/20/2019 06/19/2019 06/18/2019  Glucose 70 - 99 mg/dL 183(H) 136(H) 89  BUN 8 - 23 mg/dL 12 15 18   Creatinine 0.44 - 1.00 mg/dL 0.61 0.72 0.75  Sodium 135 - 145 mmol/L 144 142 143  Potassium 3.5 - 5.1 mmol/L 4.6 4.2 3.7  Chloride 98 - 111 mmol/L 110 107 107  CO2 22 - 32 mmol/L 26 22 25   Calcium 8.9 - 10.3 mg/dL 8.3(L) 8.3(L) 7.9(L)  Total Protein 6.5 - 8.1 g/dL - - -  Total Bilirubin 0.3 - 1.2 mg/dL - - -  Alkaline Phos 38 - 126 U/L - - -  AST 15 - 41 U/L - - -  ALT 0 - 44 U/L - - -     Imaging studies: No new pertinent imaging studies   Assessment/Plan: (ICD-10's: K17.609) 85 y.o. female with clinically improved small bowel obstruction with unclear etiology, consider post-surgical adhesions vs Crohn's exacerbation vs stricture of previous anastomosis.   - Continue on CLD for now; would leave on CLD if plan for colonoscopy tomorrow'; If no colonoscopy, we can ADAT   - Appreciate GI assistance for possible Crohn's management; steroids +/- colonoscopy 04/14             - pain control prn; antiemetics prn             - monitor abdominal examination; on-going bowel function  - No surgical intervention currently             - mobilization encouraged if feasible              -  further management per primary service; we will follow  All of the above findings and recommendations were discussed with the patient, patient's family, and the medical team, and all of patient's and family's questions were answered to their expressed satisfaction.  -- Edison Simon, PA-C [facility] 06/20/2019, 7:24 AM [phone] M-F: 7am - 4pm

## 2019-06-20 NOTE — Progress Notes (Addendum)
Cephas Darby, MD [address]  [place], [place] [address]  Main: [phone]  Fax: [phone] Pager: [phone]   Subjective: Patient reports feeling significantly better since yesterday.  NG tube has been removed.  She is tolerating clear liquids well.  She reports having 3 soft bowel movements today.  Denies any rectal bleeding.  Objective: Vital signs in last 24 hours: Vitals:   06/19/19 2033 06/20/19 0432 06/20/19 1205 06/20/19 1548  BP:  (!) 168/88 (!) 161/88 (!) 177/91  Pulse: (!) 102 100 99 98  Resp:  20 18   Temp:  98.2 F (36.8 C) 98.5 F (36.9 C)   TempSrc:  Oral Oral   SpO2: 97% 95% 98%   Weight:      Height:       Weight change:   Intake/Output Summary (Last 24 hours) at 06/20/2019 1917 Last data filed at 06/20/2019 0443 Gross per 24 hour  Intake 0 ml  Output -  Net 0 ml     Exam: Heart:: Tachycardia, regular rhythm Lungs: normal and clear to auscultation Abdomen: soft, nontender, non distended, normal bowel sounds   Lab Results: CBC Latest Ref Rng & Units 06/18/2019 06/17/2019 10/21/2018  WBC 4.0 - 10.5 K/uL 8.8 13.3(H) 8.7  Hemoglobin 12.0 - 15.0 g/dL 12.5 14.0 11.7(L)  Hematocrit 36.0 - 46.0 % 39.6 42.6 36.5  Platelets 150 - 400 K/uL 166 215 171   CMP Latest Ref Rng & Units 06/20/2019 06/19/2019 06/18/2019  Glucose 70 - 99 mg/dL 183(H) 136(H) 89  BUN 8 - 23 mg/dL 12 15 18   Creatinine 0.44 - 1.00 mg/dL 0.61 0.72 0.75  Sodium 135 - 145 mmol/L 144 142 143  Potassium 3.5 - 5.1 mmol/L 4.6 4.2 3.7  Chloride 98 - 111 mmol/L 110 107 107  CO2 22 - 32 mmol/L 26 22 25   Calcium 8.9 - 10.3 mg/dL 8.3(L) 8.3(L) 7.9(L)  Total Protein 6.5 - 8.1 g/dL - - -  Total Bilirubin 0.3 - 1.2 mg/dL - - -  Alkaline Phos 38 - 126 U/L - - -  AST 15 - 41 U/L - - -  ALT 0 - 44 U/L - - -    Micro Results: Recent Results (from the past 240 hour(s))  SARS CORONAVIRUS 2 (TAT 6-24 HRS) Nasopharyngeal Nasopharyngeal Swab     Status: None   Collection  Time: 06/17/19  5:17 PM   Specimen: Nasopharyngeal Swab  Result Value Ref Range Status   SARS Coronavirus 2 NEGATIVE NEGATIVE Final    Comment: (NOTE) SARS-CoV-2 target nucleic acids are NOT DETECTED. The SARS-CoV-2 RNA is generally detectable in upper and lower respiratory specimens during the acute phase of infection. Negative results do not preclude SARS-CoV-2 infection, do not rule out co-infections with other pathogens, and should not be used as the sole basis for treatment or other patient management decisions. Negative results must be combined with clinical observations, patient history, and epidemiological information. The expected result is Negative. Fact Sheet for Patients: SugarRoll.be Fact Sheet for Healthcare Providers: https://www.woods-mathews.com/ This test is not yet approved or cleared by the [place] FDA and  has been authorized for detection and/or diagnosis of SARS-CoV-2 by FDA under an Emergency Use Authorization (EUA). This EUA will remain  in effect (meaning this test can be used) for the duration of the COVID-19 declaration under Section 56 4(b)(1) of the Act, 21 U.S.C. section 360bbb-3(b)(1), unless the authorization is terminated or revoked sooner. Performed at [facility],  [address] [place], [place] [address]    Studies/Results: DG Abd 1 View  Result Date: 06/19/2019 CLINICAL DATA:  Small bowel obstruction EXAM: ABDOMEN - 1 VIEW COMPARISON:  06/18/2019 FINDINGS: NG tube tip is in the proximal stomach. Continued mildly dilated small bowel loops in the left abdomen. Gas within decompressed colon. No free air organomegaly. IMPRESSION: NG tube tip in the stomach. Continued mildly prominent small bowel loops in the left lower abdomen. Electronically Signed   By: Rolm Baptise M.D.   On: 06/19/2019 10:03   Medications:  I have reviewed the patient's current medications. Prior to Admission:  Medications  Prior to Admission  Medication Sig Dispense Refill Last Dose  . Adalimumab (HUMIRA) 40 MG/0.8ML PSKT Inject 40 mg into the skin every 14 (fourteen) days.    Past Month at Unknown time  . albuterol (PROVENTIL HFA;VENTOLIN HFA) 108 (90 BASE) MCG/ACT inhaler Inhale 2 puffs into the lungs every 6 (six) hours as needed for wheezing or shortness of breath.   PRN at PRN  . amitriptyline (ELAVIL) 10 MG tablet Take 20 mg by mouth at bedtime.   06/16/2019 at Unknown time  . amLODipine-benazepril (LOTREL) 5-20 MG per capsule Take 1 capsule by mouth daily.    06/16/2019 at Unknown time  . ANORO ELLIPTA 62.5-25 MCG/INH AEPB Inhale 1 puff into the lungs daily.   06/16/2019 at Unknown time  . aspirin EC 81 MG tablet Take 81 mg by mouth daily.   06/16/2019 at Unknown time  . atorvastatin (LIPITOR) 80 MG tablet Take 80 mg by mouth at bedtime.    06/16/2019 at Unknown time  . azithromycin (ZITHROMAX) 250 MG tablet Take as directed 2 each 0 06/16/2019 at Unknown time  . benzonatate (TESSALON) 100 MG capsule Take by mouth 3 (three) times daily as needed for cough.   PRN at PRN  . Calcium-Phosphorus-Vitamin D (CITRACAL +D3 PO) Take 1 tablet by mouth daily.   06/16/2019 at Unknown time  . clopidogrel (PLAVIX) 75 MG tablet Take 75 mg by mouth daily.   06/16/2019 at Unknown time  . Cyanocobalamin (B-12 PO) Take 1 tablet by mouth daily. Triple B-12   06/16/2019 at Unknown time  . esomeprazole (NEXIUM) 40 MG capsule Take 40 mg by mouth daily.   06/16/2019 at Unknown time  . Fluticasone-Salmeterol (ADVAIR) 250-50 MCG/DOSE AEPB Inhale 1 puff into the lungs 2 (two) times daily.   06/16/2019 at Unknown time  . furosemide (LASIX) 20 MG tablet Take 1 tablet (20 mg total) by mouth daily. 30 tablet 0 06/16/2019 at Unknown time  . HYDROMET 5-1.5 MG/5ML syrup Take 5 mLs by mouth every 6 (six) hours as needed.   06/16/2019 at Unknown time  . leflunomide (ARAVA) 10 MG tablet Take 10 mg by mouth daily.   06/16/2019 at Unknown time  . mesalamine (PENTASA) 500 MG  CR capsule Take 1,000 mg by mouth 2 (two) times daily.   06/16/2019 at Unknown time  . metoprolol succinate (TOPROL-XL) 25 MG 24 hr tablet Take 25 mg by mouth daily.   06/16/2019 at Unknown time  . Multiple Vitamin (MULTIVITAMIN WITH MINERALS) TABS tablet Take 1 tablet by mouth daily.   06/16/2019 at Unknown time  . nitroGLYCERIN (NITROSTAT) 0.4 MG SL tablet Place 0.4 mg under the tongue every 5 (five) minutes as needed for chest pain.   prn at prn  . potassium chloride (K-DUR,KLOR-CON) 10 MEQ tablet Take 10 mEq by mouth daily.    06/16/2019 at Unknown time  .  predniSONE (DELTASONE) 10 MG tablet Take 50 mg daily--taper by 10 mg daily then stop 15 tablet 0 06/16/2019 at Unknown time  . risedronate (ACTONEL) 150 MG tablet Take 150 mg by mouth every 30 (thirty) days. with water on empty stomach, nothing by mouth or lie down for next 30 minutes.   06/16/2019 at Unknown time  . senna (SENOKOT) 8.6 MG tablet Take 2 tablets by mouth at bedtime.    PRN at PRN  . thiamine (VITAMIN B-1) 100 MG tablet Take 100 mg by mouth daily.   06/16/2019 at Unknown time  . vitamin E 400 UNIT capsule Take 400 Units by mouth daily.   06/16/2019 at Unknown time  . methylPREDNISolone (MEDROL DOSEPAK) 4 MG TBPK tablet Take 4 mg by mouth daily.     . potassium chloride (KLOR-CON) 10 MEQ tablet Take 10 mEq by mouth daily.      Scheduled: . amitriptyline  20 mg Oral QHS  . amLODipine  5 mg Oral Daily   And  . benazepril  20 mg Oral Daily  . enoxaparin (LOVENOX) injection  40 mg Subcutaneous Q24H  . methylPREDNISolone (SOLU-MEDROL) injection  60 mg Intravenous Daily  . metoprolol succinate  25 mg Oral Daily  . pantoprazole (PROTONIX) IV  40 mg Intravenous Q24H   Continuous: . sodium chloride     MOQ:HUTMLYYTKPTWS **OR** acetaminophen, alum & mag hydroxide-simeth, hydrALAZINE, ipratropium-albuterol, morphine injection, ondansetron **OR** ondansetron (ZOFRAN) IV, polyvinyl alcohol Anti-infectives (From admission, onward)   None      Scheduled Meds: . amitriptyline  20 mg Oral QHS  . amLODipine  5 mg Oral Daily   And  . benazepril  20 mg Oral Daily  . enoxaparin (LOVENOX) injection  40 mg Subcutaneous Q24H  . methylPREDNISolone (SOLU-MEDROL) injection  60 mg Intravenous Daily  . metoprolol succinate  25 mg Oral Daily  . pantoprazole (PROTONIX) IV  40 mg Intravenous Q24H   Continuous Infusions: . 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[place])  History of Crohn's disease, s/p small bowel resection in 2014, maintained on Humira every 2 weeks Now admitted with partial small bowel obstruction  Plan: Partial SBO: Exacerbation of underlying Crohn's disease versus adhesions, elevated CRP SBO resolved with conservative management Continue IV Solu-Medrol 60 mg daily Continue clear liquid diet Plan for colonoscopy tomorrow Bowel prep ordered Long-term management of Crohn's to be determined based on the colonoscopy findings Please send stool specimen for fecal calprotectin levels  Patient also has history of rheumatoid arthritis, ankylosing spondylitis, currently maintained on leflunomide, Humira and low-dose prednisone  Patient has history of CHF, EF of 45% as well as COPD, these have to be weighed in with long-term continuation of Humira  GI will follow along with you   LOS: 3 days   Rohini Vanga 06/20/2019, 7:17 PM

## 2019-06-20 NOTE — Progress Notes (Signed)
[facility] NAME: Raven [name]    MR#:  [id number]  DATE OF BIRTH:  [birth year]  SUBJECTIVE:  patient improved improved. She feels much better after removal of NG tube. Minimal abdominal pain. Tolerating clear liquid. Had two bowel movements soft today. No vomiting. REVIEW OF SYSTEMS:   Review of Systems  Constitutional: Positive for malaise/fatigue. Negative for chills, fever and weight loss.  HENT: Negative for ear discharge, ear pain and nosebleeds.   Eyes: Negative for blurred vision, pain and discharge.  Respiratory: Positive for cough. Negative for sputum production, shortness of breath, wheezing and stridor.   Cardiovascular: Negative for chest pain, palpitations, orthopnea and PND.  Gastrointestinal: Positive for abdominal pain. Negative for diarrhea, nausea and vomiting.  Genitourinary: Negative for frequency and urgency.  Musculoskeletal: Negative for back pain and joint pain.  Neurological: Negative for sensory change, speech change, focal weakness and weakness.  Psychiatric/Behavioral: Negative for depression and hallucinations. The patient is not nervous/anxious.    Tolerating Diet:CLD Tolerating PT:   DRUG ALLERGIES:   Allergies  Allergen Reactions  . Penicillins Other (See Comments)    Reaction:  Unknown  Has patient had a PCN reaction causing immediate rash, facial/tongue/throat swelling, SOB or lightheadedness with hypotension: unknown Has patient had a PCN reaction causing severe rash involving mucus membranes or skin necrosis: unknown Has patient had a PCN reaction that required hospitalization No Has patient had a PCN reaction occurring within the last 10 years: No If all of the above answers are "NO", then may proceed with Cephalosporin use.   . Augmentin [Amoxicillin-Pot Clavulanate] Other (See Comments)    Reaction:  Unknown   . Indocin [Indomethacin] Other (See Comments)    Reaction:  Unknown   .  Lodine [Etodolac] Other (See Comments)    Reaction:   GI upset   . Methotrexate Derivatives Other (See Comments)    Reaction:  GI upset   . Gold Rash  . Zantac [Ranitidine Hcl] Rash    VITALS:  Blood pressure (!) 161/88, pulse 99, temperature 98.5 F (36.9 C), temperature source Oral, resp. rate 18, height 5' 6"  (1.676 m), weight 77.1 kg, SpO2 98 %.  PHYSICAL EXAMINATION:   Physical Exam  GENERAL:  85 y.o.-year-old patient lying in the bed with no acute distress. Obese EYES: Pupils equal, round, reactive to light and accommodation. No scleral icterus.   HEENT: Head atraumatic, normocephalic. Oropharynx and nasopharynx clear.  NECK:  Supple, no jugular venous distention. No thyroid enlargement, no tenderness.  LUNGS: Normal breath sounds bilaterally, scattered wheezing+,no rales, rhonchi. No use of accessory muscles of respiration.  CARDIOVASCULAR: S1, S2 normal. No murmurs, rubs, or gallops.  ABDOMEN: Soft, mild diffuse tenderness, mild distended. Bowel sounds present. No organomegaly or mass.  EXTREMITIES: No cyanosis, clubbing or edema b/l.    NEUROLOGIC: Cranial nerves II through XII are intact. No focal Motor or sensory deficits b/l.   PSYCHIATRIC:  patient is alert and oriented x 3.  SKIN: No obvious rash, lesion, or ulcer.   LABORATORY PANEL:  CBC Recent Labs  Lab 06/18/19 1328  WBC 8.8  HGB 12.5  HCT 39.6  PLT 166    Chemistries  Recent Labs  Lab 06/17/19 1040 06/18/19 0002 06/20/19 0508  NA 142   < > 144  K 2.9*   < > 4.6  CL 103   < > 110  CO2 26   < > 26  GLUCOSE 161*   < >  183*  BUN 23   < > 12  CREATININE 0.80   < > 0.61  CALCIUM 8.7*   < > 8.3*  MG 1.8  --  2.0  AST 20  --   --   ALT 20  --   --   ALKPHOS 64  --   --   BILITOT 0.8  --   --    < > = values in this interval not displayed.   Cardiac Enzymes No results for input(s): TROPONINI in the last 168 hours. RADIOLOGY:  DG Abd 1 View  Result Date: 06/19/2019 CLINICAL DATA:  Small bowel  obstruction EXAM: ABDOMEN - 1 VIEW COMPARISON:  06/18/2019 FINDINGS: NG tube tip is in the proximal stomach. Continued mildly dilated small bowel loops in the left abdomen. Gas within decompressed colon. No free air organomegaly. IMPRESSION: NG tube tip in the stomach. Continued mildly prominent small bowel loops in the left lower abdomen. Electronically Signed   By: Rolm Baptise M.D.   On: 06/19/2019 10:03   DG Abd 1 View  Result Date: 06/18/2019 CLINICAL DATA:  NG tube EXAM: ABDOMEN - 1 VIEW COMPARISON:  06/18/2019 at 1258 hours FINDINGS: Enteric tube terminates in the gastric fundus with side port at the GE junction. IMPRESSION: Enteric tube terminates in the gastric fundus. Electronically Signed   By: Julian Hy M.D.   On: 06/18/2019 17:27   ASSESSMENT AND PLAN:   Raven [name]  is a 85 y.o. female with a known history of Crohn's disease status post small bowel enrterectomy about eight years ago, COPD on home oxygen, depression, hyperlipidemia, DJD, history of peptic ulcer disease comes to the emergency room after she started having vomiting times two last night with abdominal pain.  1. Small bowel obstruction/ileus/ adhesions vs crohnz exacerbation -presented with abdominal pain, vomiting times two, dry heaves and abnormal CT abdomen -tolerating clear liquid -IV fluid -surgical consultation with Dr. Dahlia Byes --NG now removed -PRN antiemetics, PRN pain meds -GI--dr vanga-- recommends continue on IV solumederol. Colonoscopy plan for tomorrow  2. Chronic respiratory failure with COPD on chronic home oxygen -patient currently is 95% on 3 L. -As needed nebulizer. Patient is not in respiratory distress -prednisone 5 mg po daily--follows with dr Raul Del  3. Hypokalemia -K repleted  4. History of of Crohn's disease -on Humira (last dose 2 weeks ago) -patient follows outpatient with Dr. Gustavo Lah G.I.  5. HTN - resume BB, amlodipine and benazepril -prn IV hydralazine  6.h/o  PUD -cont IV PPI  7.HL -on Statins  PS : Please resume pt's rest of home meds after colonoscopy tomorrow. Her BP meds have been resumed  Family Communication : spoke with daughter Ms. Davis Consults : surgery, GI Code Status : DNR prior to admission confirmed with patient DVT prophylaxis : Lovenox Discharge Disposition :from home patient is currently getting for small bowel obstruction.  -GI planning to colonoscopy on 06/21/2019. -TOC for d/c planning -PT pending   TOTAL TIME TAKING CARE OF THIS PATIENT: *30* minutes.  >50% time spent on counselling and coordination of care  Note: This dictation was prepared with Dragon dictation along with smaller phrase technology. Any transcriptional errors that result from this process are unintentional.  Fritzi Mandes M.D    [facility]   CC: Primary care physician; Baxter Hire, MDPatient ID: [name], female   DOB: 04-12-1934, 85 y.o.   MRN: [id number]

## 2019-06-21 ENCOUNTER — Inpatient Hospital Stay: Payer: Medicare Other | Admitting: Anesthesiology

## 2019-06-21 ENCOUNTER — Encounter: Payer: Self-pay | Admitting: Internal Medicine

## 2019-06-21 ENCOUNTER — Encounter
Admission: EM | Disposition: A | Discharge status: Home / Self Care | Payer: Self-pay | Source: Home / Self Care | Attending: Internal Medicine

## 2019-06-21 DIAGNOSIS — K635 Polyp of colon: Secondary | ICD-10-CM

## 2019-06-21 HISTORY — PX: COLONOSCOPY WITH PROPOFOL: SHX5780

## 2019-06-21 LAB — BASIC METABOLIC PANEL
Anion gap: 9 (ref 5–15)
BUN: 16 mg/dL (ref 8–23)
CO2: 26 mmol/L (ref 22–32)
Calcium: 8.6 mg/dL — ABNORMAL LOW (ref 8.9–10.3)
Chloride: 108 mmol/L (ref 98–111)
Creatinine, Ser: 0.64 mg/dL (ref 0.44–1.00)
GFR calc Af Amer: >60 mL/min (ref >60)
GFR calc non Af Amer: >60 mL/min (ref >60)
Glucose, Bld: 153 mg/dL — ABNORMAL HIGH (ref 70–99)
Potassium: 3.7 mmol/L (ref 3.5–5.1)
Sodium: 143 mmol/L (ref 135–145)

## 2019-06-21 LAB — PHOSPHORUS: Phosphorus: 2.3 mg/dL — ABNORMAL LOW (ref 2.5–4.6)

## 2019-06-21 LAB — MAGNESIUM: Magnesium: 2.5 mg/dL — ABNORMAL HIGH (ref 1.7–2.4)

## 2019-06-21 SURGERY — COLONOSCOPY WITH PROPOFOL
Anesthesia: General

## 2019-06-21 MED ORDER — PROPOFOL 500 MG/50ML IV EMUL
INTRAVENOUS | Status: DC | PRN
  Administered 2019-06-21: 140 ug/kg/min via INTRAVENOUS

## 2019-06-21 MED ORDER — POTASSIUM & SODIUM PHOSPHATES 280-160-250 MG PO PACK
2.0000 | PACK | Freq: Once | ORAL | Status: DC
  Filled 2019-06-21: qty 2

## 2019-06-21 MED ORDER — SODIUM CHLORIDE 0.9 % IV SOLN
INTRAVENOUS | Status: DC
  Administered 2019-06-21: 13:00:00 1000 mL via INTRAVENOUS

## 2019-06-21 MED ORDER — PROPOFOL 10 MG/ML IV BOLUS
INTRAVENOUS | Status: DC | PRN
  Administered 2019-06-21: 70 mg via INTRAVENOUS

## 2019-06-21 NOTE — Op Note (Addendum)
[facility] Gastroenterology Patient Name: Raven [name] Procedure Date: 06/21/2019 3:38 PM MRN: [id number] Account #: [id number] Date of Birth: 1934-08-21 Admit Type: Outpatient Age: 85 Room: [facility] ENDO ROOM 4 Gender: Female Note Status: Finalized Procedure:             Colonoscopy Indications:           Follow-up of Crohn's disease of the small bowel,                         Abnormal CT of the GI tract, partial SBO, evaluate for                         exacerbation of crohn's Providers:             Lin Landsman MD, MD Medicines:             Monitored Anesthesia Care Complications:         No immediate complications. Estimated blood loss: None. Procedure:             Pre-Anesthesia Assessment:                        - Prior to the procedure, a History and Physical was                         performed, and patient medications and allergies were                         reviewed. The patient is competent. The risks and                         benefits of the procedure and the sedation options and                         risks were discussed with the patient. All questions                         were answered and informed consent was obtained.                         Patient identification and proposed procedure were                         verified by the physician, the nurse, the                         anesthesiologist, the anesthetist and the technician                         in the pre-procedure area in the procedure room in the                         endoscopy suite. Mental Status Examination: alert and                         oriented. Airway Examination: normal oropharyngeal                         airway and  neck mobility. Respiratory Examination:                         clear to auscultation. CV Examination: normal.                         Prophylactic Antibiotics: The patient does not require                         prophylactic antibiotics. Prior  Anticoagulants: The                         patient has taken no previous anticoagulant or                         antiplatelet agents. ASA Grade Assessment: III - A                         patient with severe systemic disease. After reviewing                         the risks and benefits, the patient was deemed in                         satisfactory condition to undergo the procedure. The                         anesthesia plan was to use monitored anesthesia care                         (MAC). Immediately prior to administration of                         medications, the patient was re-assessed for adequacy                         to receive sedatives. The heart rate, respiratory                         rate, oxygen saturations, blood pressure, adequacy of                         pulmonary ventilation, and response to care were                         monitored throughout the procedure. The physical                         status of the patient was re-assessed after the                         procedure.                        After obtaining informed consent, the colonoscope was                         passed under direct vision. Throughout the procedure,  the patient's blood pressure, pulse, and oxygen                         saturations were monitored continuously. The                         Colonoscope was introduced through the anus and                         advanced to the 20 cm into the ileum. The colonoscopy                         was performed without difficulty. The patient                         tolerated the procedure well. The quality of the bowel                         preparation was fair. Findings:      There was evidence of a patent ileo-ileal anastomosis found in the       terminal ileum at 15-20cm from IC valve. This was characterized by small       ulceration and congestion. This was traversed and the ileum proximal and       distal to  the anastamosis had normal appearing mucosa. Biopsies of the       anastomosis were taken with a cold forceps for histology.      The terminal ileum appeared normal. Biopsies were taken with a cold       forceps for histology. There was no evidence of active crohn's      A 15 mm polyp was found in the ileocecal valve. The polyp was       semi-pedunculated. The polyp was removed with a hot snare. Resection and       retrieval were complete.      The entire examined colon appeared normal.      The retroflexed view of the distal rectum and anal verge was normal and       showed no anal or rectal abnormalities.      Multiple diverticula were found in the sigmoid colon and descending       colon. Impression:            - Preparation of the colon was fair.                        - Patent ileo-ileal anastomosis, characterized by                         ulceration. Biopsied.                        - The examined portion of the ileum was normal.                         Biopsied.                        - One 15 mm polyp at the ileocecal valve, removed with  a hot snare. Resected and retrieved.                        - The entire examined colon is normal.                        - The distal rectum and anal verge are normal on                         retroflexion view.                        - Diverticulosis in the sigmoid colon and in the                         descending colon. Recommendation:        - Return patient to hospital ward for possible                         discharge same day.                        - Resume regular diet today.                        - Continue present medications.                        - Ok to stop solumedrol                        - Follow up on fecal calprotectin levels                        - Restart Humira                        - Await pathology results.                        - Return to GI clinic with Dr Alice Reichert. Procedure Code(s):      --- Professional ---                        [phone], Colonoscopy, flexible; with removal of                         tumor(s), polyp(s), or other lesion(s) by snare                         technique                        73710, 35, Colonoscopy, flexible; with biopsy, single                         or multiple Diagnosis Code(s):     --- Professional ---                        Z98.0, Intestinal bypass and anastomosis status  K63.5, Polyp of colon                        K50.00, Crohn's disease of small intestine without                         complications                        K57.30, Diverticulosis of large intestine without                         perforation or abscess without bleeding                        R93.3, Abnormal findings on diagnostic imaging of                         other parts of digestive tract CPT copyright 2019 [employer]. All rights reserved. The codes documented in this report are preliminary and upon coder review may  be revised to meet current compliance requirements. Dr. Ulyess Mort Lin Landsman MD, MD 06/21/2019 4:19:16 PM This report has been signed electronically. Number of Addenda: 0 Note Initiated On: 06/21/2019 3:38 PM Scope Withdrawal Time: 0 hours 23 minutes 6 seconds  Total Procedure Duration: 0 hours 26 minutes 32 seconds  Estimated Blood Loss:  Estimated blood loss: none.      [facility]

## 2019-06-21 NOTE — Discharge Summary (Signed)
[facility] Discharge Summary   Patient: Raven [name] AQT:[id number]  PCP: Baxter Hire, MD  Date of admission: 06/17/2019   Date of discharge:  06/21/2019     Discharge Diagnoses:  Principal diagnosis  Active Problems:   SBO (small bowel obstruction) ([place])   Chronic respiratory failure with hypoxia ([place])   Steroid-dependent COPD ([place])   Admitted From: Home Disposition:  Home   Recommendations for Outpatient Follow-up:  1. PCP: Follow-up in 1 week   Diet recommendation: Cardiac diet  Activity: The patient is advised to gradually reintroduce usual activities, as tolerated  Discharge Condition: stable  Code Status: DNR   History of present illness: As per the H and P dictated on admission  Hospital Course:  ShirleyPerryis a85 y.o.femalewith a known history of Crohn's disease status post small bowel enrterectomy about eight years ago, COPD on home oxygen, depression, hyperlipidemia, DJD, history of peptic ulcer disease comes to the emergency room after she started having vomiting times two last night with abdominal pain.  1.Small bowel obstruction/ileus/ adhesions vs crohnz exacerbation -presented with abdominal pain, vomiting times two, dry heaves and abnormal CT abdomen, tolerating clear liquid and advance to soft diet. S/p IVF -surgical consultation with Dr. Lenox Ponds now removed, s/p PRN antiemetics, PRN pain meds.  [place] oxygen resolved with conservative management and cleared by general surgery to resume diet after colonoscopy. -GI--dr vanga- s/p IV solumederol. s/p Colonoscopy done on 06/21/2019 Impression: - Preparation of the colon was fair. Patent ileo-ileal anastomosis, characterized by ulceration. Biopsied.  - The examined portion of the ileum was normal. Biopsied. - One 15 mm polyp at the ileocecal valve, removed with a hot snare. Resected and retrieved. The entire examined colon is normal. The distal rectum and anal verge are normal on  retroflexion view.  Diverticulosis in the sigmoid colon and in the descending colon. Recommendation: - Return patient to hospital ward for possible discharge same day - Resume regular diet today. Continue present medications. Ok to stop solumedrol - Follow up on fecal calprotectin level, Restart Humira - Await pathology results. - Return to GI clinic with Dr Alice Reichert.  2.Chronic respiratory failure with COPD on chronic home oxygen -patient currently is 95% on 3 L. -As needed nebulizer. Patient is not in respiratory distress -prednisone 5 mg po daily--follows with dr Raul Del  3.Hypokalemia, -K repleted  4.History of of Crohn's disease, -on Humira (last dose 2 weeks ago) -patient follows outpatient with Dr. Gustavo Lah G.I.  5. HTN, - resume BB, amlodipine and benazepril   6.h/o PUD, -cont PPI  7.HL, -on Statins   Body mass index is 27.44 kg/m.  Nutrition Interventions:   No opiates pain control prescribed - [facility] Controlled Substance Reporting System database was not reviewed. - Patient was instructed, not to drive, operate heavy machinery, perform activities at heights, swimming or participation in water activities or provide baby sitting services while on Pain, Sleep and Anxiety Medications; until her outpatient Physician has advised to do so again.  - Also recommended to not to take more than prescribed Pain, Sleep and Anxiety Medications.  Patient was ambulatory without any assistance. Patient was not seen by physical therapy, as she was ambulatory without any needs.  On the day of the discharge the patient's vitals were stable, and no other acute medical condition were reported by patient. the patient was felt safe to be discharge at Home after colonoscopy, cleared by GI.   Consultants: GI and general surgery Procedures: Colonoscopy  Discharge Exam: General: Appear in  no distress, no Rash; Oral Mucosa Clear, moist. Cardiovascular: S1 and S2 Present, no  Murmur, Respiratory: normal respiratory effort, Bilateral Air entry present and no Crackles, no wheezes Abdomen: Bowel Sound present, Soft and no tenderness, no hernia Extremities: no Pedal edema, no calf tenderness Neurology: alert and oriented to time, place, and person affect appropriate.  Filed Weights   06/17/19 1035  Weight: 77.1 kg   Vitals:   06/21/19 1643 06/21/19 1644  BP: 128/76 128/76  Pulse: 81 80  Resp: (!) 28 15  Temp:    SpO2: 91% 90%    DISCHARGE MEDICATION: Allergies as of 06/21/2019      Reactions   Penicillins Other (See Comments)   Reaction:  Unknown  Has patient had a PCN reaction causing immediate rash, facial/tongue/throat swelling, SOB or lightheadedness with hypotension: unknown Has patient had a PCN reaction causing severe rash involving mucus membranes or skin necrosis: unknown Has patient had a PCN reaction that required hospitalization No Has patient had a PCN reaction occurring within the last 10 years: No If all of the above answers are "NO", then may proceed with Cephalosporin use.   Augmentin [amoxicillin-pot Clavulanate] Other (See Comments)   Reaction:  Unknown    Indocin [indomethacin] Other (See Comments)   Reaction:  Unknown    Lodine [etodolac] Other (See Comments)   Reaction:   GI upset    Methotrexate Derivatives Other (See Comments)   Reaction:  GI upset    Gold Rash   Zantac [ranitidine Hcl] Rash      Medication List    TAKE these medications   albuterol 108 (90 Base) MCG/ACT inhaler Commonly known as: VENTOLIN HFA Inhale 2 puffs into the lungs every 6 (six) hours as needed for wheezing or shortness of breath.   amitriptyline 10 MG tablet Commonly known as: ELAVIL Take 20 mg by mouth at bedtime.   amLODipine-benazepril 5-20 MG capsule Commonly known as: LOTREL Take 1 capsule by mouth daily.   Anoro Ellipta 62.5-25 MCG/INH Aepb Generic drug: umeclidinium-vilanterol Inhale 1 puff into the lungs daily.   aspirin EC  81 MG tablet Take 81 mg by mouth daily.   atorvastatin 80 MG tablet Commonly known as: LIPITOR Take 80 mg by mouth at bedtime.   azithromycin 250 MG tablet Commonly known as: ZITHROMAX Take as directed   B-12 PO Take 1 tablet by mouth daily. Triple B-12   benzonatate 100 MG capsule Commonly known as: TESSALON Take by mouth 3 (three) times daily as needed for cough.   CITRACAL +D3 PO Take 1 tablet by mouth daily.   clopidogrel 75 MG tablet Commonly known as: PLAVIX Take 75 mg by mouth daily.   esomeprazole 40 MG capsule Commonly known as: NEXIUM Take 40 mg by mouth daily.   Fluticasone-Salmeterol 250-50 MCG/DOSE Aepb Commonly known as: ADVAIR Inhale 1 puff into the lungs 2 (two) times daily.   furosemide 20 MG tablet Commonly known as: LASIX Take 1 tablet (20 mg total) by mouth daily.   Humira 40 MG/0.8ML Pskt Generic drug: Adalimumab Inject 40 mg into the skin every 14 (fourteen) days.   Hydromet 5-1.5 MG/5ML syrup Generic drug: HYDROcodone-homatropine Take 5 mLs by mouth every 6 (six) hours as needed.   leflunomide 10 MG tablet Commonly known as: ARAVA Take 10 mg by mouth daily.   mesalamine 500 MG CR capsule Commonly known as: PENTASA Take 1,000 mg by mouth 2 (two) times daily.   methylPREDNISolone 4 MG Tbpk tablet Commonly known as: MEDROL  DOSEPAK Take 4 mg by mouth daily.   metoprolol succinate 25 MG 24 hr tablet Commonly known as: TOPROL-XL Take 25 mg by mouth daily.   multivitamin with minerals Tabs tablet Take 1 tablet by mouth daily.   nitroGLYCERIN 0.4 MG SL tablet Commonly known as: NITROSTAT Place 0.4 mg under the tongue every 5 (five) minutes as needed for chest pain.   potassium chloride 10 MEQ tablet Commonly known as: KLOR-CON Take 10 mEq by mouth daily.   potassium chloride 10 MEQ tablet Commonly known as: KLOR-CON Take 10 mEq by mouth daily.   predniSONE 10 MG tablet Commonly known as: DELTASONE Take 50 mg daily--taper by  10 mg daily then stop   risedronate 150 MG tablet Commonly known as: ACTONEL Take 150 mg by mouth every 30 (thirty) days. with water on empty stomach, nothing by mouth or lie down for next 30 minutes.   senna 8.6 MG tablet Commonly known as: SENOKOT Take 2 tablets by mouth at bedtime.   thiamine 100 MG tablet Commonly known as: Vitamin B-1 Take 100 mg by mouth daily.   vitamin E 180 MG (400 UNITS) capsule Take 400 Units by mouth daily.      Allergies  Allergen Reactions  . Penicillins Other (See Comments)    Reaction:  Unknown  Has patient had a PCN reaction causing immediate rash, facial/tongue/throat swelling, SOB or lightheadedness with hypotension: unknown Has patient had a PCN reaction causing severe rash involving mucus membranes or skin necrosis: unknown Has patient had a PCN reaction that required hospitalization No Has patient had a PCN reaction occurring within the last 10 years: No If all of the above answers are "NO", then may proceed with Cephalosporin use.   . Augmentin [Amoxicillin-Pot Clavulanate] Other (See Comments)    Reaction:  Unknown   . Indocin [Indomethacin] Other (See Comments)    Reaction:  Unknown   . Lodine [Etodolac] Other (See Comments)    Reaction:   GI upset   . Methotrexate Derivatives Other (See Comments)    Reaction:  GI upset   . Gold Rash  . Zantac [Ranitidine Hcl] Rash   Discharge Instructions    Call MD for:  persistant nausea and vomiting   Complete by: As directed    Call MD for:  severe uncontrolled pain   Complete by: As directed    Call MD for:  temperature >100.4   Complete by: As directed    Diet - low sodium heart healthy   Complete by: As directed    Discharge instructions   Complete by: As directed    Follow-up with PCP in 1 week Follow-up with GI in 1 to 2 weeks   Increase activity slowly   Complete by: As directed       The results of significant diagnostics from this hospitalization (including imaging,  microbiology, ancillary and laboratory) are listed below for reference.    Significant Diagnostic Studies: DG Abd 1 View  Result Date: 06/19/2019 CLINICAL DATA:  Small bowel obstruction EXAM: ABDOMEN - 1 VIEW COMPARISON:  06/18/2019 FINDINGS: NG tube tip is in the proximal stomach. Continued mildly dilated small bowel loops in the left abdomen. Gas within decompressed colon. No free air organomegaly. IMPRESSION: NG tube tip in the stomach. Continued mildly prominent small bowel loops in the left lower abdomen. Electronically Signed   By: Rolm Baptise M.D.   On: 06/19/2019 10:03   DG Abd 1 View  Result Date: 06/18/2019 CLINICAL DATA:  NG tube  EXAM: ABDOMEN - 1 VIEW COMPARISON:  06/18/2019 at 1258 hours FINDINGS: Enteric tube terminates in the gastric fundus with side port at the GE junction. IMPRESSION: Enteric tube terminates in the gastric fundus. Electronically Signed   By: Julian Hy M.D.   On: 06/18/2019 17:27   CT Abdomen Pelvis W Contrast  Result Date: 06/17/2019 CLINICAL DATA:  85 year old female with acute abdominal pain. EXAM: CT ABDOMEN AND PELVIS WITH CONTRAST TECHNIQUE: Multidetector CT imaging of the abdomen and pelvis was performed using the standard protocol following bolus administration of intravenous contrast. CONTRAST:  15m OMNIPAQUE IOHEXOL 300 MG/ML  SOLN COMPARISON:  CT abdomen pelvis dated 02/15/2015. FINDINGS: Lower chest: There are bibasilar atelectasis/scarring. There is background of emphysema. Three vessel coronary vascular calcification. There is no intra-abdominal free air. There is a small free fluid in the upper abdomen and within the pelvis. Hepatobiliary: The liver is unremarkable. No intrahepatic biliary ductal dilatation. The gallbladder is unremarkable. Pancreas: Unremarkable. No pancreatic ductal dilatation or surrounding inflammatory changes. Spleen: Normal in size without focal abnormality. Adrenals/Urinary Tract: The adrenal glands are unremarkable. Mild  bilateral renal parenchyma atrophy and cortical scarring. There is no hydronephrosis on either side. There is symmetric enhancement and excretion of contrast by both kidneys. The visualized ureters and urinary bladder appear unremarkable. Stomach/Bowel: There is postsurgical changes of distal small bowel resection with anastomotic suture. There is mild thickening of the terminal ileum distal to the suture. There is dilatation of the loops of small bowel proximal to the anastomosis in keeping with a degree of obstruction. In addition there is apparent thickening of a segment of the small bowel in the left lower abdomen (series 2, image 72 and coronal series 5 image 51). There is mild dilatation of the loop of small bowel proximal to the segments which may be related to adhesion. There is colonic diverticulosis without active inflammatory changes. Vascular/Lymphatic: There is advanced aortoiliac atherosclerotic disease. The IVC is unremarkable. No portal venous gas. There is no adenopathy. Reproductive: Hysterectomy. Other: Small fat containing umbilical hernia. Musculoskeletal: Osteopenia with degenerative changes of the spine. No acute osseous pathology. IMPRESSION: 1. Postsurgical changes of distal small bowel resection with findings concerning for a degree of obstruction proximal to the anastomosis. In addition additional segments of apparent small bowel thickening with mild dilatation of the proximal adjacent loops may represent additional areas of obstruction possibly secondary to adhesion. Small-bowel series may provide better evaluation. Colonic diverticulosis. 2. Small ascites. 3. Aortic Atherosclerosis (ICD10-I70.0) and Emphysema (ICD10-J43.9). Electronically Signed   By: AAnner CreteM.D.   On: 06/17/2019 17:09   DG ABD ACUTE 2+V W 1V CHEST  Result Date: 06/18/2019 CLINICAL DATA:  Small bowel obstruction. History of Crohn's disease. EXAM: DG ABDOMEN ACUTE W/ 1V CHEST COMPARISON:  Abdominal films  05/14/2012 and CT 06/17/2019 as well as chest x-ray 10/21/2018 FINDINGS: Lungs are adequately inflated demonstrate stable moderate elevation of the right hemidiaphragm. Minimal prominence of the perihilar vessels suggesting minimal vascular congestion. No lobar consolidation or effusion. Remainder of the chest is unchanged. Bowel gas pattern demonstrates persistence of several air-filled mildly dilated small bowel loops over the lower mid to right abdomen. Air is present within the colon. No free peritoneal air. Remainder the exam is unchanged. IMPRESSION: 1. Persistent air-filled mildly dilated small bowel loops over the lower mid to right abdomen. Air within the colon. Findings may be due to early/partial small bowel obstruction. 2. Findings suggesting mild vascular congestion. Chronic elevation right hemidiaphragm. Electronically Signed   By:  Marin Olp M.D.   On: 06/18/2019 15:35    Microbiology: Recent Results (from the past 240 hour(s))  SARS CORONAVIRUS 2 (TAT 6-24 HRS) Nasopharyngeal Nasopharyngeal Swab     Status: None   Collection Time: 06/17/19  5:17 PM   Specimen: Nasopharyngeal Swab  Result Value Ref Range Status   SARS Coronavirus 2 NEGATIVE NEGATIVE Final    Comment: (NOTE) SARS-CoV-2 target nucleic acids are NOT DETECTED. The SARS-CoV-2 RNA is generally detectable in upper and lower respiratory specimens during the acute phase of infection. Negative results do not preclude SARS-CoV-2 infection, do not rule out co-infections with other pathogens, and should not be used as the sole basis for treatment or other patient management decisions. Negative results must be combined with clinical observations, patient history, and epidemiological information. The expected result is Negative. Fact Sheet for Patients: SugarRoll.be Fact Sheet for Healthcare Providers: https://www.woods-mathews.com/ This test is not yet approved or cleared by the  [place] FDA and  has been authorized for detection and/or diagnosis of SARS-CoV-2 by FDA under an Emergency Use Authorization (EUA). This EUA will remain  in effect (meaning this test can be used) for the duration of the COVID-19 declaration under Section 56 4(b)(1) of the Act, 21 U.S.C. section 360bbb-3(b)(1), unless the authorization is terminated or revoked sooner. Performed at [facility], [place] [address], [place],  [address]      Labs: CBC: Recent Labs  Lab 06/17/19 1040 06/18/19 1328  WBC 13.3* 8.8  HGB 14.0 12.5  HCT 42.6 39.6  MCV 93.6 100.0  PLT 215 025   Basic Metabolic Panel: Recent Labs  Lab 06/17/19 1040 06/17/19 1040 06/18/19 0002 06/18/19 0720 06/19/19 0546 06/20/19 0508 06/21/19 0525  NA 142  --   --  143 142 144 143  K 2.9*   < > 3.9 3.7 4.2 4.6 3.7  CL 103  --   --  107 107 110 108  CO2 26  --   --  25 22 26 26   GLUCOSE 161*  --   --  89 136* 183* 153*  BUN 23  --   --  18 15 12 16   CREATININE 0.80  --   --  0.75 0.72 0.61 0.64  CALCIUM 8.7*  --   --  7.9* 8.3* 8.3* 8.6*  MG 1.8  --   --   --   --  2.0 2.5*  PHOS  --   --   --   --   --  3.5 2.3*   < > = values in this interval not displayed.   Liver Function Tests: Recent Labs  Lab 06/17/19 1040 06/20/19 0508  AST 20  --   ALT 20  --   ALKPHOS 64  --   BILITOT 0.8  --   PROT 6.4*  --   ALBUMIN 4.1 3.5   Recent Labs  Lab 06/17/19 1040  LIPASE 22   No results for input(s): AMMONIA in the last 168 hours. Cardiac Enzymes: No results for input(s): CKTOTAL, CKMB, CKMBINDEX, TROPONINI in the last 168 hours. BNP (last 3 results) Recent Labs    10/20/18 1205  BNP 659.0*   CBG: Recent Labs  Lab 06/19/19 0427 06/19/19 0757 06/19/19 1216  GLUCAP 131* 122* 106*    Time spent: 35 minutes  Signed:  Val Riles  [facility]  06/21/2019 5:33 PM

## 2019-06-21 NOTE — Progress Notes (Signed)
[facility] SURGICAL PROGRESS NOTE (cpt [id number])  Hospital Day(s): 4.   Post op day(s): Day of Surgery.   Interval History: Patient seen and examined, no acute events or new complaints overnight. Patient is doing well. Still with some generalized abdominal pain, no nausea or emesis. Labs are stable, some electrolyte derangement. Tolerate CLD yesterday without issues, having multiple BMs. Plan for colonoscopy with GI today.   Review of Systems:  Constitutional: denies fever, chills  HEENT: denies cough or congestion  Respiratory: denies any shortness of breath  Cardiovascular: denies chest pain or palpitations  Gastrointestinal: + abdominal pain, denied N/V, or diarrhea/and bowel function as per interval history Genitourinary: denies burning with urination or urinary frequency   Vital signs in last 24 hours: [min-max] current  Temp:  [98.1 F (36.7 C)-98.5 F (36.9 C)] 98.3 F (36.8 C) (04/14 0541) Pulse Rate:  [84-99] 84 (04/14 0541) Resp:  [18] 18 (04/14 0541) BP: (137-181)/(68-108) 148/71 (04/14 0541) SpO2:  [95 %-100 %] 95 % (04/14 0541)     Height: 5' 6"  (167.6 cm) Weight: 77.1 kg BMI (Calculated): 27.45   Intake/Output last 2 shifts:  No intake/output data recorded.   Physical Exam:  Constitutional: alert, cooperative and no distress  HENT: normocephalic without obvious abnormality Eyes: PERRL, EOM's grossly intact and symmetric  Respiratory: breathing non-labored at rest  Cardiovascular: regular rate and sinus rhythm  Gastrointestinal:Soft,improved generalized abdominal tenderness, non-distended, no rebound/guarding. Musculoskeletal: no edema or wounds, motor and sensation grossly intact, NT     Labs:  CBC Latest Ref Rng & Units 06/18/2019 06/17/2019 10/21/2018  WBC 4.0 - 10.5 K/uL 8.8 13.3(H) 8.7  Hemoglobin 12.0 - 15.0 g/dL 12.5 14.0 11.7(L)  Hematocrit 36.0 - 46.0 % 39.6 42.6 36.5  Platelets 150 - 400 K/uL 166 215 171   CMP Latest Ref Rng &  Units 06/21/2019 06/20/2019 06/19/2019  Glucose 70 - 99 mg/dL 153(H) 183(H) 136(H)  BUN 8 - 23 mg/dL 16 12 15   Creatinine 0.44 - 1.00 mg/dL 0.64 0.61 0.72  Sodium 135 - 145 mmol/L 143 144 142  Potassium 3.5 - 5.1 mmol/L 3.7 4.6 4.2  Chloride 98 - 111 mmol/L 108 110 107  CO2 22 - 32 mmol/L 26 26 22   Calcium 8.9 - 10.3 mg/dL 8.6(L) 8.3(L) 8.3(L)  Total Protein 6.5 - 8.1 g/dL - - -  Total Bilirubin 0.3 - 1.2 mg/dL - - -  Alkaline Phos 38 - 126 U/L - - -  AST 15 - 41 U/L - - -  ALT 0 - 44 U/L - - -     Imaging studies: No new pertinent imaging studies   Assessment/Plan: (ICD-10's: ICD-10's: K69.609) 85 y.o. female with clinically improved small bowel obstruction with unclear etiology, favor exacerbation of Crohn's vs post-surgical adhesions.    - NPO for colonoscopy; from surgical standpoint okay to advance diet following procedure  - Appreciate GI assistance; colonoscopy plan for today, continue medical management Crohn's per recommendation  - pain control prn; antiemetics prn - monitor abdominal examination; on-going bowel function             - No surgical intervention currently - mobilization encouraged if feasible - further management per primary service; we will follow  All of the above findings and recommendations were discussed with the patient, and the medical team, and all of patient's questions were answered to her expressed satisfaction.  -- Edison Simon, PA-C [facility] 06/21/2019, 7:36 AM [id number] M-F: 7am - 4pm

## 2019-06-21 NOTE — Progress Notes (Signed)
[name]  A and O x 4. VSS. Pt tolerating diet well. No complaints of pain or nausea. IV removed intact, no new prescriptions given. Pt voiced understanding of discharge instructions with no further questions. Pt discharged via wheelchair. RN explain to pt she needed be qualify for continuous oxygen, pt stated she has enough oxygen at home. She wears 3 litters at home all the time. Pt wants to go home today. RN notified MD. Per MD educate pt to check her oxygen levels at home.    Allergies as of 06/21/2019      Reactions   Penicillins Other (See Comments)   Reaction:  Unknown  Has patient had a PCN reaction causing immediate rash, facial/tongue/throat swelling, SOB or lightheadedness with hypotension: unknown Has patient had a PCN reaction causing severe rash involving mucus membranes or skin necrosis: unknown Has patient had a PCN reaction that required hospitalization No Has patient had a PCN reaction occurring within the last 10 years: No If all of the above answers are "NO", then may proceed with Cephalosporin use.   Augmentin [amoxicillin-pot Clavulanate] Other (See Comments)   Reaction:  Unknown    Indocin [indomethacin] Other (See Comments)   Reaction:  Unknown    Lodine [etodolac] Other (See Comments)   Reaction:   GI upset    Methotrexate Derivatives Other (See Comments)   Reaction:  GI upset    Gold Rash   Zantac [ranitidine Hcl] Rash      Medication List    TAKE these medications   albuterol 108 (90 Base) MCG/ACT inhaler Commonly known as: VENTOLIN HFA Inhale 2 puffs into the lungs every 6 (six) hours as needed for wheezing or shortness of breath.   amitriptyline 10 MG tablet Commonly known as: ELAVIL Take 20 mg by mouth at bedtime.   amLODipine-benazepril 5-20 MG capsule Commonly known as: LOTREL Take 1 capsule by mouth daily.   Anoro Ellipta 62.5-25 MCG/INH Aepb Generic drug: umeclidinium-vilanterol Inhale 1 puff into the lungs daily.   aspirin EC 81 MG  tablet Take 81 mg by mouth daily.   atorvastatin 80 MG tablet Commonly known as: LIPITOR Take 80 mg by mouth at bedtime.   azithromycin 250 MG tablet Commonly known as: ZITHROMAX Take as directed   B-12 PO Take 1 tablet by mouth daily. Triple B-12   benzonatate 100 MG capsule Commonly known as: TESSALON Take by mouth 3 (three) times daily as needed for cough.   CITRACAL +D3 PO Take 1 tablet by mouth daily.   clopidogrel 75 MG tablet Commonly known as: PLAVIX Take 75 mg by mouth daily.   esomeprazole 40 MG capsule Commonly known as: NEXIUM Take 40 mg by mouth daily.   Fluticasone-Salmeterol 250-50 MCG/DOSE Aepb Commonly known as: ADVAIR Inhale 1 puff into the lungs 2 (two) times daily.   furosemide 20 MG tablet Commonly known as: LASIX Take 1 tablet (20 mg total) by mouth daily.   Humira 40 MG/0.8ML Pskt Generic drug: Adalimumab Inject 40 mg into the skin every 14 (fourteen) days.   Hydromet 5-1.5 MG/5ML syrup Generic drug: HYDROcodone-homatropine Take 5 mLs by mouth every 6 (six) hours as needed.   leflunomide 10 MG tablet Commonly known as: ARAVA Take 10 mg by mouth daily.   mesalamine 500 MG CR capsule Commonly known as: PENTASA Take 1,000 mg by mouth 2 (two) times daily.   methylPREDNISolone 4 MG Tbpk tablet Commonly known as: MEDROL DOSEPAK Take 4 mg by mouth daily.   metoprolol succinate  25 MG 24 hr tablet Commonly known as: TOPROL-XL Take 25 mg by mouth daily.   multivitamin with minerals Tabs tablet Take 1 tablet by mouth daily.   nitroGLYCERIN 0.4 MG SL tablet Commonly known as: NITROSTAT Place 0.4 mg under the tongue every 5 (five) minutes as needed for chest pain.   potassium chloride 10 MEQ tablet Commonly known as: KLOR-CON Take 10 mEq by mouth daily.   potassium chloride 10 MEQ tablet Commonly known as: KLOR-CON Take 10 mEq by mouth daily.   predniSONE 10 MG tablet Commonly known as: DELTASONE Take 50 mg daily--taper by 10 mg  daily then stop   risedronate 150 MG tablet Commonly known as: ACTONEL Take 150 mg by mouth every 30 (thirty) days. with water on empty stomach, nothing by mouth or lie down for next 30 minutes.   senna 8.6 MG tablet Commonly known as: SENOKOT Take 2 tablets by mouth at bedtime.   thiamine 100 MG tablet Commonly known as: Vitamin B-1 Take 100 mg by mouth daily.   vitamin E 180 MG (400 UNITS) capsule Take 400 Units by mouth daily.       Vitals:   06/21/19 1738 06/21/19 1755  BP: (!) 145/64   Pulse: 61   Resp: 16   Temp: 98 F (36.7 C)   SpO2: (!) 85% 90%    Francesco Sor

## 2019-06-21 NOTE — Consult Note (Signed)
[facility] for Electrolyte Monitoring and Replacement   Recent Labs: Potassium (mmol/L)  Date Value  06/21/2019 3.7  06/09/2014 3.4 (L)   Magnesium (mg/dL)  Date Value  06/21/2019 2.5 (H)  08/29/2011 1.8   Calcium (mg/dL)  Date Value  06/21/2019 8.6 (L)   Calcium, Total (mg/dL)  Date Value  06/09/2014 7.7 (L)   Albumin (g/dL)  Date Value  06/20/2019 3.5  06/10/2014 2.9 (L)   Phosphorus (mg/dL)  Date Value  06/21/2019 2.3 (L)   Sodium (mmol/L)  Date Value  06/21/2019 143  06/09/2014 137   Corrected Ca: 8.70 mg/dL  Assessment: 85 y.o. female with persistent small bowel obstruction with unclear etiology: post-surgical adhesions vs Crohn's exacerbation vs stricture of previous anastomosis.  Goal of Therapy:  Electrolytes WNL  Plan:   Replace phosphorous with Phos-Nak 2 packets x 1 (this provides approximately 16 mmol phosphorous)  Follow-up electrolyte levels with am labs  Dallie Piles ,PharmD Clinical Pharmacist 06/21/2019 8:41 AM

## 2019-06-21 NOTE — Anesthesia Preprocedure Evaluation (Signed)
Anesthesia Evaluation  Patient identified by MRN, date of birth, ID band Patient awake    Reviewed: Allergy & Precautions, NPO status   History of Anesthesia Complications Negative for: history of anesthetic complications  Airway Mallampati: II       Dental  (+) Teeth Intact   Pulmonary COPD,  oxygen dependent, former smoker,     + decreased breath sounds      Cardiovascular hypertension, Pt. on home beta blockers + Cardiac Stents  Normal cardiovascular exam     Neuro/Psych Depression    GI/Hepatic Neg liver ROS, PUD,   Endo/Other  negative endocrine ROS  Renal/GU negative Renal ROS     Musculoskeletal  (+) Arthritis ,   Abdominal (+) + obese,   Peds  Hematology  (+) anemia ,   Anesthesia Other Findings   Reproductive/Obstetrics                             Anesthesia Physical  Anesthesia Plan  ASA: III  Anesthesia Plan: General   Post-op Pain Management:    Induction: Intravenous  PONV Risk Score and Plan:   Airway Management Planned: Nasal Cannula  Additional Equipment:   Intra-op Plan:   Post-operative Plan:   Informed Consent: I have reviewed the patients History and Physical, chart, labs and discussed the procedure including the risks, benefits and alternatives for the proposed anesthesia with the patient or authorized representative who has indicated his/her understanding and acceptance.       Plan Discussed with: CRNA  Anesthesia Plan Comments:         Anesthesia Quick Evaluation

## 2019-06-21 NOTE — Transfer of Care (Signed)
Immediate Anesthesia Transfer of Care Note  Patient: LENETTE RAU  Procedure(s) Performed: COLONOSCOPY WITH PROPOFOL (N/A )  Patient Location: PACU  Anesthesia Type:General  Level of Consciousness: awake, alert  and oriented  Airway & Oxygen Therapy: Patient Spontanous Breathing and Patient connected to nasal cannula oxygen  Post-op Assessment: Report given to RN and Post -op Vital signs reviewed and stable  Post vital signs: Reviewed and stable  Last Vitals:  Vitals Value Taken Time  BP 124/51 06/21/19 1616  Temp 36.6 C 06/21/19 1614  Pulse 95 06/21/19 1618  Resp 11 06/21/19 1618  SpO2 93 % 06/21/19 1618  Vitals shown include unvalidated device data.  Last Pain:  Vitals:   06/21/19 1614  TempSrc: Temporal  PainSc: Asleep         Complications: No apparent anesthesia complications

## 2019-06-22 ENCOUNTER — Encounter: Payer: Self-pay | Admitting: *Deleted

## 2019-06-22 LAB — CALPROTECTIN, FECAL: Calprotectin, Fecal: 44 ug/g (ref 0–120)

## 2019-06-22 NOTE — Anesthesia Postprocedure Evaluation (Signed)
Anesthesia Post Note  Patient: Raven [name]  Procedure(s) Performed: COLONOSCOPY WITH PROPOFOL (N/A )  Patient location during evaluation: PACU Anesthesia Type: General Level of consciousness: awake and alert Pain management: pain level controlled Vital Signs Assessment: post-procedure vital signs reviewed and stable Respiratory status: spontaneous breathing, nonlabored ventilation, respiratory function stable and patient connected to nasal cannula oxygen Cardiovascular status: blood pressure returned to baseline and stable Postop Assessment: no apparent nausea or vomiting Anesthetic complications: no     Last Vitals:  Vitals:   06/21/19 1738 06/21/19 1755  BP: (!) 145/64   Pulse: 61   Resp: 16   Temp: 36.7 C   SpO2: (!) 85% 90%    Last Pain:  Vitals:   06/21/19 1738  TempSrc: Oral  PainSc:                  Molli Barrows

## 2019-06-23 LAB — SURGICAL PATHOLOGY

## 2019-07-04 LAB — ADALIMUMAB LEVEL AND ANTIBODY
Adalimumab Drug Level: 5.6 ug/mL
Anti-Adalimumab Antibody: <25 ng/mL

## 2019-08-01 ENCOUNTER — Encounter: Payer: Self-pay | Admitting: *Deleted

## 2019-08-01 ENCOUNTER — Ambulatory Visit: Payer: Medicare Other | Admitting: Gastroenterology

## 2019-08-25 ENCOUNTER — Emergency Department: Payer: Medicare Other

## 2019-08-25 ENCOUNTER — Inpatient Hospital Stay
Admission: EM | Admit: 2019-08-25 | Discharge: 2019-08-27 | DRG: 291 | Disposition: A | Discharge status: Home Health | Payer: Medicare Other | Attending: Internal Medicine | Admitting: Internal Medicine

## 2019-08-25 ENCOUNTER — Other Ambulatory Visit: Payer: Self-pay

## 2019-08-25 ENCOUNTER — Encounter: Payer: Self-pay | Admitting: Internal Medicine

## 2019-08-25 DIAGNOSIS — Z91048 Other nonmedicinal substance allergy status: Secondary | ICD-10-CM

## 2019-08-25 DIAGNOSIS — J9621 Acute and chronic respiratory failure with hypoxia: Secondary | ICD-10-CM | POA: Diagnosis present

## 2019-08-25 DIAGNOSIS — I5043 Acute on chronic combined systolic (congestive) and diastolic (congestive) heart failure: Secondary | ICD-10-CM | POA: Diagnosis present

## 2019-08-25 DIAGNOSIS — I5033 Acute on chronic diastolic (congestive) heart failure: Secondary | ICD-10-CM | POA: Diagnosis not present

## 2019-08-25 DIAGNOSIS — Z79899 Other long term (current) drug therapy: Secondary | ICD-10-CM

## 2019-08-25 DIAGNOSIS — I248 Other forms of acute ischemic heart disease: Secondary | ICD-10-CM | POA: Diagnosis present

## 2019-08-25 DIAGNOSIS — Z7951 Long term (current) use of inhaled steroids: Secondary | ICD-10-CM | POA: Diagnosis not present

## 2019-08-25 DIAGNOSIS — I10 Essential (primary) hypertension: Secondary | ICD-10-CM | POA: Diagnosis present

## 2019-08-25 DIAGNOSIS — I252 Old myocardial infarction: Secondary | ICD-10-CM

## 2019-08-25 DIAGNOSIS — Z87891 Personal history of nicotine dependence: Secondary | ICD-10-CM

## 2019-08-25 DIAGNOSIS — I2510 Atherosclerotic heart disease of native coronary artery without angina pectoris: Secondary | ICD-10-CM | POA: Diagnosis present

## 2019-08-25 DIAGNOSIS — Z20822 Contact with and (suspected) exposure to covid-19: Secondary | ICD-10-CM | POA: Diagnosis present

## 2019-08-25 DIAGNOSIS — I447 Left bundle-branch block, unspecified: Secondary | ICD-10-CM | POA: Diagnosis present

## 2019-08-25 DIAGNOSIS — R0602 Shortness of breath: Secondary | ICD-10-CM | POA: Diagnosis present

## 2019-08-25 DIAGNOSIS — E876 Hypokalemia: Secondary | ICD-10-CM | POA: Diagnosis present

## 2019-08-25 DIAGNOSIS — Z7902 Long term (current) use of antithrombotics/antiplatelets: Secondary | ICD-10-CM

## 2019-08-25 DIAGNOSIS — Z7982 Long term (current) use of aspirin: Secondary | ICD-10-CM | POA: Diagnosis not present

## 2019-08-25 DIAGNOSIS — F418 Other specified anxiety disorders: Secondary | ICD-10-CM | POA: Diagnosis present

## 2019-08-25 DIAGNOSIS — M8580 Other specified disorders of bone density and structure, unspecified site: Secondary | ICD-10-CM | POA: Diagnosis present

## 2019-08-25 DIAGNOSIS — Z9981 Dependence on supplemental oxygen: Secondary | ICD-10-CM

## 2019-08-25 DIAGNOSIS — I5023 Acute on chronic systolic (congestive) heart failure: Secondary | ICD-10-CM | POA: Diagnosis not present

## 2019-08-25 DIAGNOSIS — R778 Other specified abnormalities of plasma proteins: Secondary | ICD-10-CM | POA: Diagnosis present

## 2019-08-25 DIAGNOSIS — I110 Hypertensive heart disease with heart failure: Secondary | ICD-10-CM | POA: Diagnosis present

## 2019-08-25 DIAGNOSIS — Z8249 Family history of ischemic heart disease and other diseases of the circulatory system: Secondary | ICD-10-CM

## 2019-08-25 DIAGNOSIS — E785 Hyperlipidemia, unspecified: Secondary | ICD-10-CM | POA: Diagnosis present

## 2019-08-25 DIAGNOSIS — F329 Major depressive disorder, single episode, unspecified: Secondary | ICD-10-CM | POA: Diagnosis present

## 2019-08-25 DIAGNOSIS — Z880 Allergy status to penicillin: Secondary | ICD-10-CM

## 2019-08-25 DIAGNOSIS — M1990 Unspecified osteoarthritis, unspecified site: Secondary | ICD-10-CM | POA: Diagnosis present

## 2019-08-25 DIAGNOSIS — J441 Chronic obstructive pulmonary disease with (acute) exacerbation: Secondary | ICD-10-CM | POA: Diagnosis present

## 2019-08-25 DIAGNOSIS — Z7952 Long term (current) use of systemic steroids: Secondary | ICD-10-CM | POA: Diagnosis not present

## 2019-08-25 DIAGNOSIS — Z881 Allergy status to other antibiotic agents status: Secondary | ICD-10-CM

## 2019-08-25 DIAGNOSIS — F419 Anxiety disorder, unspecified: Secondary | ICD-10-CM | POA: Diagnosis present

## 2019-08-25 DIAGNOSIS — K5090 Crohn's disease, unspecified, without complications: Secondary | ICD-10-CM | POA: Diagnosis present

## 2019-08-25 DIAGNOSIS — R7401 Elevation of levels of liver transaminase levels: Secondary | ICD-10-CM | POA: Diagnosis present

## 2019-08-25 DIAGNOSIS — Z8711 Personal history of peptic ulcer disease: Secondary | ICD-10-CM

## 2019-08-25 DIAGNOSIS — Z888 Allergy status to other drugs, medicaments and biological substances status: Secondary | ICD-10-CM

## 2019-08-25 LAB — CBC WITH DIFFERENTIAL/PLATELET
Abs Immature Granulocytes: 0.1 10*3/uL — ABNORMAL HIGH (ref 0.00–0.07)
Basophils Absolute: 0.1 10*3/uL (ref 0.0–0.1)
Basophils Relative: 1 %
Eosinophils Absolute: 0.1 10*3/uL (ref 0.0–0.5)
Eosinophils Relative: 1 %
HCT: 37.2 % (ref 36.0–46.0)
Hemoglobin: 12.5 g/dL (ref 12.0–15.0)
Immature Granulocytes: 1 %
Lymphocytes Relative: 13 %
Lymphs Abs: 1.1 10*3/uL (ref 0.7–4.0)
MCH: 32.1 pg (ref 26.0–34.0)
MCHC: 33.6 g/dL (ref 30.0–36.0)
MCV: 95.4 fL (ref 80.0–100.0)
Monocytes Absolute: 1 10*3/uL (ref 0.1–1.0)
Monocytes Relative: 11 %
Neutro Abs: 6.6 10*3/uL (ref 1.7–7.7)
Neutrophils Relative %: 73 %
Platelets: 183 10*3/uL (ref 150–400)
RBC: 3.9 MIL/uL (ref 3.87–5.11)
RDW: 14.6 % (ref 11.5–15.5)
WBC: 9 10*3/uL (ref 4.0–10.5)
nRBC: 0 % (ref 0.0–0.2)

## 2019-08-25 LAB — LACTIC ACID, PLASMA: Lactic Acid, Venous: 1.6 mmol/L (ref 0.5–1.9)

## 2019-08-25 LAB — COMPREHENSIVE METABOLIC PANEL
ALT: 35 U/L (ref 0–44)
AST: 18 U/L (ref 15–41)
Albumin: 3.5 g/dL (ref 3.5–5.0)
Alkaline Phosphatase: 76 U/L (ref 38–126)
Anion gap: 11 (ref 5–15)
BUN: 18 mg/dL (ref 8–23)
CO2: 25 mmol/L (ref 22–32)
Calcium: 8 mg/dL — ABNORMAL LOW (ref 8.9–10.3)
Chloride: 107 mmol/L (ref 98–111)
Creatinine, Ser: 0.84 mg/dL (ref 0.44–1.00)
GFR calc Af Amer: >60 mL/min (ref >60)
GFR calc non Af Amer: >60 mL/min (ref >60)
Glucose, Bld: 110 mg/dL — ABNORMAL HIGH (ref 70–99)
Potassium: 2.9 mmol/L — ABNORMAL LOW (ref 3.5–5.1)
Sodium: 143 mmol/L (ref 135–145)
Total Bilirubin: 1 mg/dL (ref 0.3–1.2)
Total Protein: 5.8 g/dL — ABNORMAL LOW (ref 6.5–8.1)

## 2019-08-25 LAB — TROPONIN I (HIGH SENSITIVITY)
Troponin I (High Sensitivity): 75 ng/L — ABNORMAL HIGH (ref <18)
Troponin I (High Sensitivity): 69 ng/L — ABNORMAL HIGH (ref <18)
Troponin I (High Sensitivity): 52 ng/L — ABNORMAL HIGH (ref <18)
Troponin I (High Sensitivity): 46 ng/L — ABNORMAL HIGH (ref <18)

## 2019-08-25 LAB — BRAIN NATRIURETIC PEPTIDE: B Natriuretic Peptide: 635 pg/mL — ABNORMAL HIGH (ref 0.0–100.0)

## 2019-08-25 LAB — SARS CORONAVIRUS 2 BY RT PCR (HOSPITAL ORDER, PERFORMED IN ~~LOC~~ HOSPITAL LAB): SARS Coronavirus 2: NEGATIVE

## 2019-08-25 MED ORDER — FUROSEMIDE 10 MG/ML IJ SOLN
40.0000 mg | Freq: Two times a day (BID) | INTRAMUSCULAR | Status: DC
  Administered 2019-08-26 – 2019-08-27 (×3): 40 mg via INTRAVENOUS
  Filled 2019-08-25 (×3): qty 4

## 2019-08-25 MED ORDER — ENOXAPARIN SODIUM 40 MG/0.4ML ~~LOC~~ SOLN
40.0000 mg | SUBCUTANEOUS | Status: DC
  Administered 2019-08-25 – 2019-08-26 (×2): 40 mg via SUBCUTANEOUS
  Filled 2019-08-25 (×2): qty 0.4

## 2019-08-25 MED ORDER — IPRATROPIUM-ALBUTEROL 0.5-2.5 (3) MG/3ML IN SOLN
3.0000 mL | RESPIRATORY_TRACT | Status: DC
  Administered 2019-08-25 – 2019-08-27 (×10): 3 mL via RESPIRATORY_TRACT
  Filled 2019-08-25 (×10): qty 3

## 2019-08-25 MED ORDER — ATORVASTATIN CALCIUM 80 MG PO TABS
80.0000 mg | ORAL_TABLET | Freq: Every day | ORAL | Status: DC
  Administered 2019-08-26: 80 mg via ORAL
  Filled 2019-08-25: qty 1

## 2019-08-25 MED ORDER — SODIUM CHLORIDE 0.9% FLUSH
3.0000 mL | INTRAVENOUS | Status: DC | PRN
  Administered 2019-08-27: 3 mL via INTRAVENOUS

## 2019-08-25 MED ORDER — ONDANSETRON HCL 4 MG/2ML IJ SOLN: 4.0000 mg | Freq: Four times a day (QID) | INTRAMUSCULAR | Status: DC | PRN

## 2019-08-25 MED ORDER — ASPIRIN EC 81 MG PO TBEC
81.0000 mg | DELAYED_RELEASE_TABLET | Freq: Every day | ORAL | Status: DC
  Administered 2019-08-25 – 2019-08-27 (×3): 81 mg via ORAL
  Filled 2019-08-25 (×3): qty 1

## 2019-08-25 MED ORDER — AZITHROMYCIN 250 MG PO TABS
250.0000 mg | ORAL_TABLET | Freq: Every day | ORAL | Status: DC
  Administered 2019-08-26 – 2019-08-27 (×2): 250 mg via ORAL
  Filled 2019-08-25 (×2): qty 1

## 2019-08-25 MED ORDER — SODIUM CHLORIDE 0.9 % IV SOLN: 250.0000 mL | INTRAVENOUS | Status: DC | PRN

## 2019-08-25 MED ORDER — VITAMIN B12-FOLIC ACID 500-400 MCG PO TABS: 1.0000 | ORAL_TABLET | Freq: Every day | ORAL | Status: DC

## 2019-08-25 MED ORDER — IPRATROPIUM-ALBUTEROL 0.5-2.5 (3) MG/3ML IN SOLN
3.0000 mL | Freq: Once | RESPIRATORY_TRACT | Status: AC
  Administered 2019-08-25: 3 mL via RESPIRATORY_TRACT

## 2019-08-25 MED ORDER — VITAMIN B-12 1000 MCG PO TABS
500.0000 ug | ORAL_TABLET | Freq: Every day | ORAL | Status: DC
  Administered 2019-08-25 – 2019-08-27 (×3): 500 ug via ORAL
  Filled 2019-08-25 (×3): qty 1

## 2019-08-25 MED ORDER — FOLIC ACID 1 MG PO TABS
0.5000 mg | ORAL_TABLET | Freq: Every day | ORAL | Status: DC
  Administered 2019-08-25 – 2019-08-27 (×3): 0.5 mg via ORAL
  Filled 2019-08-25 (×3): qty 1

## 2019-08-25 MED ORDER — VITAMIN B-1 100 MG PO TABS
100.0000 mg | ORAL_TABLET | Freq: Every day | ORAL | Status: DC
  Administered 2019-08-25 – 2019-08-27 (×3): 100 mg via ORAL
  Filled 2019-08-25 (×5): qty 1

## 2019-08-25 MED ORDER — LISINOPRIL 5 MG PO TABS
2.5000 mg | ORAL_TABLET | Freq: Every day | ORAL | Status: DC
  Administered 2019-08-25 – 2019-08-27 (×3): 2.5 mg via ORAL
  Filled 2019-08-25 (×3): qty 1

## 2019-08-25 MED ORDER — AMITRIPTYLINE HCL 10 MG PO TABS
20.0000 mg | ORAL_TABLET | Freq: Every day | ORAL | Status: DC
  Administered 2019-08-26: 20 mg via ORAL
  Filled 2019-08-25 (×3): qty 2

## 2019-08-25 MED ORDER — BENAZEPRIL HCL 20 MG PO TABS
20.0000 mg | ORAL_TABLET | Freq: Every day | ORAL | Status: DC
  Administered 2019-08-25 – 2019-08-26 (×2): 20 mg via ORAL
  Filled 2019-08-25 (×2): qty 1

## 2019-08-25 MED ORDER — ADULT MULTIVITAMIN W/MINERALS CH
1.0000 | ORAL_TABLET | Freq: Every day | ORAL | Status: DC
  Administered 2019-08-26 – 2019-08-27 (×2): 1 via ORAL
  Filled 2019-08-25 (×2): qty 1

## 2019-08-25 MED ORDER — LEFLUNOMIDE 20 MG PO TABS
10.0000 mg | ORAL_TABLET | Freq: Every day | ORAL | Status: DC
  Administered 2019-08-26 – 2019-08-27 (×2): 10 mg via ORAL
  Filled 2019-08-25 (×3): qty 0.5

## 2019-08-25 MED ORDER — AZITHROMYCIN 250 MG PO TABS
500.0000 mg | ORAL_TABLET | Freq: Every day | ORAL | Status: AC
  Administered 2019-08-25: 500 mg via ORAL
  Filled 2019-08-25: qty 2

## 2019-08-25 MED ORDER — CALCIUM CARBONATE-VITAMIN D 500-200 MG-UNIT PO TABS
1.0000 | ORAL_TABLET | Freq: Every day | ORAL | Status: DC
  Administered 2019-08-26 – 2019-08-27 (×2): 1 via ORAL
  Filled 2019-08-25 (×2): qty 1

## 2019-08-25 MED ORDER — FUROSEMIDE 10 MG/ML IJ SOLN
40.0000 mg | Freq: Once | INTRAMUSCULAR | Status: AC
  Administered 2019-08-25: 40 mg via INTRAVENOUS
  Filled 2019-08-25: qty 4

## 2019-08-25 MED ORDER — METHYLPREDNISOLONE SODIUM SUCC 40 MG IJ SOLR
40.0000 mg | Freq: Two times a day (BID) | INTRAMUSCULAR | Status: DC
  Administered 2019-08-25 – 2019-08-27 (×4): 40 mg via INTRAVENOUS
  Filled 2019-08-25 (×4): qty 1

## 2019-08-25 MED ORDER — METOPROLOL SUCCINATE ER 50 MG PO TB24
50.0000 mg | ORAL_TABLET | Freq: Every day | ORAL | Status: DC
  Administered 2019-08-26 – 2019-08-27 (×2): 50 mg via ORAL
  Filled 2019-08-25 (×2): qty 1

## 2019-08-25 MED ORDER — POTASSIUM CHLORIDE CRYS ER 20 MEQ PO TBCR
40.0000 meq | EXTENDED_RELEASE_TABLET | ORAL | Status: AC
  Administered 2019-08-25 (×2): 40 meq via ORAL
  Filled 2019-08-25 (×2): qty 2

## 2019-08-25 MED ORDER — IPRATROPIUM-ALBUTEROL 0.5-2.5 (3) MG/3ML IN SOLN
3.0000 mL | Freq: Once | RESPIRATORY_TRACT | Status: AC
  Administered 2019-08-25: 3 mL via RESPIRATORY_TRACT
  Filled 2019-08-25: qty 3

## 2019-08-25 MED ORDER — PANTOPRAZOLE SODIUM 40 MG PO TBEC
40.0000 mg | DELAYED_RELEASE_TABLET | Freq: Every day | ORAL | Status: DC
  Administered 2019-08-27: 40 mg via ORAL
  Filled 2019-08-25 (×2): qty 1

## 2019-08-25 MED ORDER — ACETAMINOPHEN 325 MG PO TABS: 650.0000 mg | ORAL_TABLET | ORAL | Status: DC | PRN

## 2019-08-25 MED ORDER — AMLODIPINE BESY-BENAZEPRIL HCL 5-20 MG PO CAPS: 1.0000 | ORAL_CAPSULE | Freq: Every day | ORAL | Status: DC

## 2019-08-25 MED ORDER — DM-GUAIFENESIN ER 30-600 MG PO TB12: 1.0000 | ORAL_TABLET | Freq: Two times a day (BID) | ORAL | Status: DC | PRN

## 2019-08-25 MED ORDER — POTASSIUM CHLORIDE 10 MEQ/100ML IV SOLN
10.0000 meq | INTRAVENOUS | Status: AC
  Administered 2019-08-25: 10 meq via INTRAVENOUS
  Filled 2019-08-25: qty 100

## 2019-08-25 MED ORDER — UMECLIDINIUM-VILANTEROL 62.5-25 MCG/INH IN AEPB
1.0000 | INHALATION_SPRAY | Freq: Every day | RESPIRATORY_TRACT | Status: DC
  Administered 2019-08-26: 1 via RESPIRATORY_TRACT
  Filled 2019-08-25: qty 14

## 2019-08-25 MED ORDER — ALBUTEROL SULFATE (2.5 MG/3ML) 0.083% IN NEBU: 2.5000 mg | INHALATION_SOLUTION | RESPIRATORY_TRACT | Status: DC | PRN

## 2019-08-25 MED ORDER — MESALAMINE ER 250 MG PO CPCR
1000.0000 mg | ORAL_CAPSULE | Freq: Two times a day (BID) | ORAL | Status: DC
  Administered 2019-08-26 – 2019-08-27 (×3): 1000 mg via ORAL
  Filled 2019-08-25 (×5): qty 4

## 2019-08-25 MED ORDER — AMLODIPINE BESYLATE 5 MG PO TABS
5.0000 mg | ORAL_TABLET | Freq: Every day | ORAL | Status: DC
  Administered 2019-08-25 – 2019-08-26 (×2): 5 mg via ORAL
  Filled 2019-08-25 (×2): qty 1

## 2019-08-25 MED ORDER — SODIUM CHLORIDE 0.9% FLUSH
3.0000 mL | Freq: Two times a day (BID) | INTRAVENOUS | Status: DC
  Administered 2019-08-25 – 2019-08-26 (×3): 3 mL via INTRAVENOUS

## 2019-08-25 MED ORDER — HYDRALAZINE HCL 20 MG/ML IJ SOLN: 5.0000 mg | INTRAMUSCULAR | Status: DC | PRN

## 2019-08-25 MED ORDER — NITROGLYCERIN 0.4 MG SL SUBL: 0.4000 mg | SUBLINGUAL_TABLET | SUBLINGUAL | Status: DC | PRN

## 2019-08-25 MED ORDER — MAGNESIUM SULFATE IN D5W 1-5 GM/100ML-% IV SOLN
1.0000 g | Freq: Once | INTRAVENOUS | Status: AC
  Administered 2019-08-25: 1 g via INTRAVENOUS
  Filled 2019-08-25: qty 100

## 2019-08-25 NOTE — TOC Initial Note (Signed)
Transition of Care [facility]) - Initial/Assessment Note    Patient Details  Name: Raven [name] MRN: [id number] Date of Birth: 08/12/1934  [facility] CM/SW Contact:    Anselm Pancoast, RN Phone Number: 08/25/2019, 2:47 PM  Clinical Narrative:                 Spoke with patient and son at bedside. Patient states she lives alone and is independent with ADL's. Patient drives and recently bought a new car. Son states he is the only family available to assist but does as needed. Patient states she has had home health in the past and is agreeable to home health if needed at this discharge. No preference for agencies at this time. Patient has no current needs or concerns and states she just wants to feel better and return home.   Expected Discharge Plan: [place] Barriers to Discharge: Continued Medical Work up   Patient Goals and CMS Choice Patient states their goals for this hospitalization and ongoing recovery are:: Get back home to her new car      Expected Discharge Plan and Services Expected Discharge Plan: [place]       Living arrangements for the past 2 months: Single Family Home                                      Prior Living Arrangements/Services Living arrangements for the past 2 months: Single Family Home Lives with:: Self Patient language and need for interpreter reviewed:: Yes Do you feel safe going back to the place where you live?: Yes      Need for Family Participation in Patient Care: Yes (Comment) Care giver support system in place?: Yes (comment) Current home services: DME (walker, wheelchair) Criminal Activity/Legal Involvement Pertinent to Current Situation/Hospitalization: No - Comment as needed  Activities of Daily Living      Permission Sought/Granted Permission sought to share information with : Case Manager Permission granted to share information with : Yes, Verbal Permission Granted  Share  Information with NAME: TOC Department           Emotional Assessment Appearance:: Appears stated age Attitude/Demeanor/Rapport: Engaged Affect (typically observed): Accepting Orientation: : Oriented to Self, Oriented to Place, Oriented to  Time, Oriented to Situation Alcohol / Substance Use: Never Used Psych Involvement: No (comment)  Admission diagnosis:  Acute on chronic diastolic CHF (congestive heart failure) ([place]) [I50.33] Patient Active Problem List   Diagnosis Date Noted  . Acute on chronic diastolic CHF (congestive heart failure) ([place]) 08/25/2019  . HLD (hyperlipidemia) 08/25/2019  . Depression   . CAD (coronary artery disease)   . Elevated troponin   . Hypokalemia   . Acute on chronic respiratory failure with hypoxia ([place])   . Steroid-dependent COPD ([place])   . SBO (small bowel obstruction) ([place]) 06/17/2019  . Crohn's disease of small intestine with intestinal obstruction ([place])   . COPD exacerbation ([place]) 10/20/2018  . COPD (chronic obstructive pulmonary disease) ([place]) 04/22/2016  . Acute bronchitis 04/17/2016  . Sepsis ([place]) 04/17/2016  . Peroneal tendonitis 07/18/2015  . Pneumonia 04/19/2015  . CAP (community acquired pneumonia) 11/02/2014   PCP:  Baxter Hire, MD Pharmacy:   [place], [place] - [place] [address] [place] [address] Phone: [phone] Fax: [phone]  [place], [place]  [place] [address] [place] [address] Phone: [phone] Fax: [phone]     Social Determinants of Health (SDOH) Interventions    Readmission Risk Interventions No flowsheet data found.

## 2019-08-25 NOTE — Plan of Care (Signed)
  Problem: Education: Goal: Knowledge of General Education information will improve Description: Including pain rating scale, medication(s)/side effects and non-pharmacologic comfort measures Outcome: Progressing   Problem: Clinical Measurements: Goal: Respiratory complications will improve Outcome: Progressing Goal: Cardiovascular complication will be avoided Outcome: Progressing   Problem: Safety: Goal: Ability to remain free from injury will improve Outcome: Progressing   Problem: Skin Integrity: Goal: Risk for impaired skin integrity will decrease Outcome: Progressing

## 2019-08-25 NOTE — ED Triage Notes (Signed)
Pt presents from home with shortness of breath and cough that has progressively worsened over the past 3 days. On 3L O2 at baseline. Reports she has not been taking her prescribed inhalers.

## 2019-08-25 NOTE — ED Notes (Signed)
Requested lab draw for blood cultures- unable to abotain from IV, previous RN endorses IV attempt x2.

## 2019-08-25 NOTE — ED Notes (Signed)
Lab with pt

## 2019-08-25 NOTE — H&P (Signed)
History and Physical    CERIA SUMINSKI XWR:[id number] DOB: 11/12/1934 DOA: 08/25/2019  Referring MD/NP/PA:   PCP: Baxter Hire, MD   Patient coming from:  The patient is coming from home.  At baseline, pt is independent for most of ADL.        Chief Complaint: SOB  HPI: Raven [name] is a 85 y.o. female with medical history significant of hypertension, hyperlipidemia, COPD on 3 L oxygen, asthma, PAD, depression, CAD, myocardial infarction, Crohn's disease, CHF, anemia, who presents with shortness of breath.  Patient has shortness of breath in the past 3 days, which has been progressively worsening.  Patient has cough, no chest pain, fever or chills. Patient was seen by pulmonologist on Monday, and started patient with doxycycline and steroid without significant improvement.  Patient has nausea, vomited twice in the early morning, which has resolved.  Currently patient does not have nausea, vomiting.  Patient has loose stool bowel movement, but denies diarrhea.  No abdominal pain.  Denies symptoms of UTI or unilateral weakness.   ED Course: pt was found to have BNP 635, troponin 75, lactic acid 1.6, WBC 9.0, pending COVID-19 PCR, potassium 2.9, renal function okay, temperature normal, blood pressure 142/62, heart rate 91, RR 15, oxygen saturation 91-94% on home level 3 L oxygen.  Chest x-ray showed interstitial pulmonary edema.  Patient is admitted to progressive bed as inpatient.  Review of Systems:   General: no fevers, chills, no body weight gain, has fatigue HEENT: no blurry vision, hearing changes or sore throat Respiratory: has dyspnea, coughing, wheezing CV: no chest pain, no palpitations GI: has nausea, vomiting, no abdominal pain, diarrhea, constipation GU: no dysuria, burning on urination, increased urinary frequency, hematuria  Ext: has leg edema Neuro: no unilateral weakness, numbness, or tingling, no vision change or hearing loss Skin: no rash, no skin tear. MSK: No  muscle spasm, no deformity, no limitation of range of movement in spin Heme: No easy bruising.  Travel history: No recent long distant travel.  Allergy:  Allergies  Allergen Reactions  . Penicillins Other (See Comments)    Reaction:  Unknown  Has patient had a PCN reaction causing immediate rash, facial/tongue/throat swelling, SOB or lightheadedness with hypotension: unknown Has patient had a PCN reaction causing severe rash involving mucus membranes or skin necrosis: unknown Has patient had a PCN reaction that required hospitalization No Has patient had a PCN reaction occurring within the last 10 years: No If all of the above answers are "NO", then may proceed with Cephalosporin use.   . Augmentin [Amoxicillin-Pot Clavulanate] Other (See Comments)    Reaction:  Unknown   . Indocin [Indomethacin] Other (See Comments)    Reaction:  Unknown   . Lodine [Etodolac] Other (See Comments)    Reaction:   GI upset   . Methotrexate Derivatives Other (See Comments)    Reaction:  GI upset   . Gold Rash  . Zantac [Ranitidine Hcl] Rash    Past Medical History:  Diagnosis Date  . Anemia   . Arthritis   . Asthma   . Complication of anesthesia   . COPD (chronic obstructive pulmonary disease) ([place])   . Crohn's disease ([place])   . Depression    after death of husband  . Hyperlipidemia   . Hypertension   . Myocardial infarction ([place])   . Osteopenia   . Peptic ulcer disease   . Pneumonia   . PONV (postoperative nausea and vomiting)     Past  Surgical History:  Procedure Laterality Date  . ABDOMINAL SURGERY    . CARDIAC CATHETERIZATION     with stent  . COLONOSCOPY WITH PROPOFOL N/A 09/27/2014   Procedure: COLONOSCOPY WITH PROPOFOL;  Surgeon: [name], MD;  [facility] END[facility];  Service: Gastroenterology;  Laterality: N/A;  . COLONOSCOPY WITH PROPOFOL N/A 06/21/2019   Procedure: COLONOSCOPY WITH PROPOFOL;  Surgeon: Lin Landsman, MD;  Location: [facility] ENDOSCOPY;  Service:  Gastroenterology;  Laterality: N/A;  . EYE SURGERY     bilateral cataract surgeries  . SHOULDER SURGERY Right    x 2   . SMALL INTESTINE SURGERY      Social History:  reports that she quit smoking about 47 years ago. She has never used smokeless tobacco. She reports current alcohol use. She reports that she does not use drugs.  Family History:  Family History  Problem Relation Age of Onset  . Hypertension Other      Prior to Admission medications   Medication Sig Start Date End Date Taking? Authorizing Provider  Adalimumab (HUMIRA) 40 MG/0.8ML PSKT Inject 40 mg into the skin every 14 (fourteen) days.     [provider]  albuterol (PROVENTIL HFA;VENTOLIN HFA) 108 (90 BASE) MCG/ACT inhaler Inhale 2 puffs into the lungs every 6 (six) hours as needed for wheezing or shortness of breath.    [provider]  amitriptyline (ELAVIL) 10 MG tablet Take 20 mg by mouth at bedtime.    [provider]  amLODipine-benazepril (LOTREL) 5-20 MG per capsule Take 1 capsule by mouth daily.     [provider]  ANORO ELLIPTA 62.5-25 MCG/INH AEPB Inhale 1 puff into the lungs daily. 05/29/19   [provider]  aspirin EC 81 MG tablet Take 81 mg by mouth daily.    [provider]  atorvastatin (LIPITOR) 80 MG tablet Take 80 mg by mouth at bedtime.     [provider]  azithromycin (ZITHROMAX) 250 MG tablet Take as directed 10/23/18   Fritzi Mandes, MD  benzonatate (TESSALON) 100 MG capsule Take by mouth 3 (three) times daily as needed for cough.    [provider]  Calcium-Phosphorus-Vitamin D (CITRACAL +D3 PO) Take 1 tablet by mouth daily.    [provider]  clopidogrel (PLAVIX) 75 MG tablet Take 75 mg by mouth daily.    [provider]  Cyanocobalamin (B-12 PO) Take 1 tablet by mouth daily. Triple B-12    [provider]  doxycycline (VIBRAMYCIN) 100 MG capsule Take 100 mg by mouth 2 (two) times daily. 08/21/19    [provider]  esomeprazole (NEXIUM) 40 MG capsule Take 40 mg by mouth daily. 05/29/19   [provider]  Fluticasone-Salmeterol (ADVAIR) 250-50 MCG/DOSE AEPB Inhale 1 puff into the lungs 2 (two) times daily.    [provider]  furosemide (LASIX) 20 MG tablet Take 1 tablet (20 mg total) by mouth daily. 10/23/18   Fritzi Mandes, MD  HYDROMET 5-1.5 MG/5ML syrup Take 5 mLs by mouth every 6 (six) hours as needed. 06/14/19   [provider]  leflunomide (ARAVA) 10 MG tablet Take 10 mg by mouth daily. 03/29/19   [provider]  mesalamine (PENTASA) 500 MG CR capsule Take 1,000 mg by mouth 2 (two) times daily.    [provider]  methylPREDNISolone (MEDROL DOSEPAK) 4 MG TBPK tablet Take 4 mg by mouth daily. 06/14/19   [provider]  metoprolol succinate (TOPROL-XL) 25 MG 24 hr tablet Take  25 mg by mouth daily.    [provider]  Multiple Vitamin (MULTIVITAMIN WITH MINERALS) TABS tablet Take 1 tablet by mouth daily.    [provider]  nitroGLYCERIN (NITROSTAT) 0.4 MG SL tablet Place 0.4 mg under the tongue every 5 (five) minutes as needed for chest pain.    [provider]  potassium chloride (K-DUR,KLOR-CON) 10 MEQ tablet Take 10 mEq by mouth daily.     [provider]  potassium chloride (KLOR-CON) 10 MEQ tablet Take 10 mEq by mouth daily. 05/29/19   [provider]  predniSONE (DELTASONE) 1 MG tablet Take 2 mg by mouth at bedtime. 07/24/19   [provider]  risedronate (ACTONEL) 150 MG tablet Take 150 mg by mouth every 30 (thirty) days. with water on empty stomach, nothing by mouth or lie down for next 30 minutes.    [provider]  senna (SENOKOT) 8.6 MG tablet Take 2 tablets by mouth at bedtime.     [provider]  thiamine (VITAMIN B-1) 100 MG tablet Take 100 mg by mouth daily.    [provider]  vitamin E 400 UNIT capsule Take 400 Units by mouth daily.     [provider]    Physical Exam: Vitals:   08/25/19 1413 08/25/19 1500 08/25/19 1528 08/25/19 1630  BP:  (!) 156/67  (!) 166/78  Pulse: 85  82   Resp:  (!) 21 18   Temp:      TempSrc:      SpO2: 93%  100%   Weight:      Height:       General: Not in acute distress HEENT:       Eyes: PERRL, EOMI, no scleral icterus.       ENT: No discharge from the ears and nose, no pharynx injection, no tonsillar enlargement.        Neck: No JVD, no bruit, no mass felt. Heme: No neck lymph node enlargement. Cardiac: S1/S2, RRR, No murmurs, No gallops or rubs. Respiratory: has rhonchi bilaterally GI: Soft, nondistended, nontender, no rebound pain, no organomegaly, BS present. GU: No hematuria Ext: 1+ pitting leg edema bilaterally. 2+DP/PT pulse bilaterally. Musculoskeletal: No joint deformities, No joint redness or warmth, no limitation of ROM in spin. Skin: No rashes.  Neuro: Alert, oriented X3, cranial nerves II-XII grossly intact, moves all extremities normally. Psych: Patient is not psychotic, no suicidal or hemocidal ideation.  Labs on Admission: I have personally reviewed following labs and imaging studies  CBC: Recent Labs  Lab 08/25/19 1041  WBC 9.0  NEUTROABS 6.6  HGB 12.5  HCT 37.2  MCV 95.4  PLT 176   Basic Metabolic Panel: Recent Labs  Lab 08/25/19 1041  NA 143  K 2.9*  CL 107  CO2 25  GLUCOSE 110*  BUN 18  CREATININE 0.84  CALCIUM 8.0*   GFR: Estimated Creatinine Clearance: 53.2 mL/min (by C-G formula based on SCr of 0.84 mg/dL). Liver Function Tests: Recent Labs  Lab 08/25/19 1041  AST 18  ALT 35  ALKPHOS 76  BILITOT 1.0  PROT 5.8*  ALBUMIN 3.5   No results for input(s): LIPASE, AMYLASE in the last 168 hours. No results for input(s): AMMONIA in the last 168 hours. Coagulation Profile: No results for input(s): INR, PROTIME in the last 168 hours. Cardiac Enzymes: No results for input(s): CKTOTAL, CKMB, CKMBINDEX, TROPONINI in the last 168  hours. BNP (last 3 results) No results for input(s): PROBNP in the last 8760  hours. HbA1C: No results for input(s): HGBA1C in the last 72 hours. CBG: No results for input(s): GLUCAP in the last 168 hours. Lipid Profile: No results for input(s): CHOL, HDL, LDLCALC, TRIG, CHOLHDL, LDLDIRECT in the last 72 hours. Thyroid Function Tests: No results for input(s): TSH, T4TOTAL, FREET4, T3FREE, THYROIDAB in the last 72 hours. Anemia Panel: No results for input(s): VITAMINB12, FOLATE, FERRITIN, TIBC, IRON, RETICCTPCT in the last 72 hours. Urine analysis:    Component Value Date/Time   COLORURINE AMBER (A) 06/17/2019 1336   APPEARANCEUR HAZY (A) 06/17/2019 1336   APPEARANCEUR Clear 06/08/2014 2052   LABSPEC 1.020 06/17/2019 1336   LABSPEC 1.015 06/08/2014 2052   PHURINE 5.0 06/17/2019 1336   GLUCOSEU NEGATIVE 06/17/2019 1336   GLUCOSEU Negative 06/08/2014 2052   HGBUR NEGATIVE 06/17/2019 1336   BILIRUBINUR NEGATIVE 06/17/2019 1336   BILIRUBINUR Negative 06/08/2014 2052   KETONESUR NEGATIVE 06/17/2019 1336   PROTEINUR 100 (A) 06/17/2019 1336   NITRITE NEGATIVE 06/17/2019 1336   LEUKOCYTESUR SMALL (A) 06/17/2019 1336   LEUKOCYTESUR Negative 06/08/2014 2052   Sepsis Labs: @LABRCNTIP (procalcitonin:4,lacticidven:4) ) Recent Results (from the past 240 hour(s))  SARS Coronavirus 2 by RT PCR (hospital order, performed in [place] hospital lab) Nasopharyngeal Nasopharyngeal Swab     Status: None   Collection Time: 08/25/19 10:41 AM   Specimen: Nasopharyngeal Swab  Result Value Ref Range Status   SARS Coronavirus 2 NEGATIVE NEGATIVE Final    Comment: (NOTE) SARS-CoV-2 target nucleic acids are NOT DETECTED.  The SARS-CoV-2 RNA is generally detectable in upper and lower respiratory specimens during the acute phase of infection. The lowest concentration of SARS-CoV-2 viral copies this assay can detect is 250 copies / mL. A negative result does not preclude SARS-CoV-2 infection and  should not be used as the sole basis for treatment or other patient management decisions.  A negative result may occur with improper specimen collection / handling, submission of specimen other than nasopharyngeal swab, presence of viral mutation(s) within the areas targeted by this assay, and inadequate number of viral copies (<250 copies / mL). A negative result must be combined with clinical observations, patient history, and epidemiological information.  Fact Sheet for Patients:   StrictlyIdeas.no  Fact Sheet for Healthcare Providers: BankingDealers.co.za  This test is not yet approved or  cleared by the [place] FDA and has been authorized for detection and/or diagnosis of SARS-CoV-2 by FDA under an Emergency Use Authorization (EUA).  This EUA will remain in effect (meaning this test can be used) for the duration of the COVID-19 declaration under Section 564(b)(1) of the Act, 21 U.S.C. section 360bbb-3(b)(1), unless the authorization is terminated or revoked sooner.  Performed at [facility], [address] [place], [place] [address]      Radiological Exams on Admission: DG Chest Portable 1 View  Result Date: 08/25/2019 CLINICAL DATA:  Worsening shortness of breath and cough for 3 days. History of asthma. EXAM: PORTABLE CHEST 1 VIEW COMPARISON:  October 21, 2018 FINDINGS: The right hemidiaphragm is elevated. The heart, hila, and mediastinum are unchanged. Increased interstitial markings are seen in the lungs. Mildly more focal opacity in left base. Mild atelectasis in the right base. No nodules or masses. No other acute abnormalities. IMPRESSION: 1. Diffuse increased interstitial opacities may represent pulmonary edema versus atypical infection. More focal opacity in left base could represent atelectasis or developing infiltrate. Recommend clinical correlation and short-term follow-up imaging to ensure resolution of  findings. Electronically Signed   By: Shanon Brow  Jimmye Norman III M.D   On: 08/25/2019 11:09     EKG: Reviewed independently, sinus rhythm, QTC 490, LAD, poor R wave progression, left bundle blockage  Assessment/Plan Principal Problem:   Acute on chronic respiratory failure with hypoxia (HCC) Active Problems:   COPD exacerbation (HCC)   Acute on chronic diastolic CHF (congestive heart failure) (HCC)   HLD (hyperlipidemia)   Depression   CAD (coronary artery disease)   Elevated troponin   Hypokalemia   HTN (hypertension)   Crohn's disease ([place])   Acute on chronic respiratory failure with hypoxia ([place]): Due to combination of CHF and COPD exacerbation.  Patient has leg edema, elevated BNP, chest x-ray showed interstitial pulmonary edema, consistent with CHF exacerbation.  Patient has cough and  rhonchi on auscultation, consistent with COPD exacerbation.  -Admitted to progressive bed as inpatient -Bronchodilators -IV Lasix -Nasal cannula oxygen to maintain oxygen saturation above 93%  COPD exacerbation ([place]): -Bronchodilators -Solu-Medrol 40 mg IV bid -Z pak  -Mucinex for cough  -Incentive spirometry -Follow up blood culture x2, sputum culture -Nasal cannula oxygen as needed to maintain O2 saturation 93% or greater  Acute on chronic diastolic CHF (congestive heart failure) ([place]): 2D echo on 10/21/2018 showed EF 96-28% with diastolic dysfunction.  BNP 635. -Lasix 40 mg bid by IV -2d echo -Daily weights -strict I/O's -Low salt diet -Fluid restriction -Obtain REDs Vest reading  Elevated troponin and hx of CAD: Troponin 75.  No chest pain.  Likely due to demand ischemia secondary to CHF exacerbation.  Patient has a left bundle blockage on EKG, which does not seem to be new. -Continue aspirin, Lipitor, metoprolol, Plavix -Trend troponin -Check A1c, FLP -Repeat EKG in the morning -Follow-up 2D echo  HLD (hyperlipidemia) -Lipitor  HTN:  -Continue home medications: Lotrel,  metoprolol -Hold HCTZ since patient is on IV Lasix -Newly added lisinopril 2.5 mg for CHF -hydralazine prn  Depression: no SI or HI -Continue home amitriptyline  Crohn's disease: -Continue mesalamine and leflunomide  Hypokalemia: K= 2.9  on admission. - Repleted - Check Mg level - Give 1 g of magnesium sulfate   DVT ppx: SQ Lovenox Code Status:  Partial code (I discussed with the patient in the presence of her son, and explained the meaning of CODE STATUS, patient wants to be partial code, OK for CPR, but no intubation). Family Communication: Yes, patient's son   at bed side Disposition Plan:  Anticipate discharge back to previous environment Consults called:  none Admission status: progressive unit as inpt       Status is: Inpatient  Remains inpatient appropriate because:Inpatient level of care appropriate due to severity of illness.  Patient has multiple comorbidities, now presents with respiratory failure with hypoxia due to combination of COPD and CHF exacerbation.  Patient also has elevated troponin, hypokalemia.  Her presentation is highly complicated.  Patient is at high risk of deteriorating.  Will need to be treated in hospital for at least 2 days.   Dispo: The patient is from: Home              Anticipated d/c is to: Home              Anticipated d/c date is: 2 days              Patient currently is not medically stable to d/c.           Date of Service 08/25/2019    Ivor [facility]   If 7PM-7AM, please contact  night-coverage [email or web address] 08/25/2019, 5:36 PM

## 2019-08-25 NOTE — ED Provider Notes (Signed)
[facility] Emergency Department Provider Note  ____________________________________________   First MD Initiated Contact with Patient 08/25/19 1026     (approximate)  I have reviewed the triage vital signs and the nursing notes.   HISTORY  Chief Complaint Cough and Shortness of Breath    HPI Raven [name] is a 85 y.o. female with past medical history as below here with cough, shortness of breath, wheezing.  Patient states that for the last week, she has had progressively worsening cough and shortness of breath.  She has had fatigue and chills.  She was seen by her pulmonologist on Monday and started on steroids as well as doxycycline.  She has been taking this as prescribed.  Since taking this, she has not noticed any kind of significant improvement.  She has had progressively worsening shortness of breath and cough.  She has had nausea.  She states she has had difficulty getting around her house due to this.  She feels similar to when she has had pneumonia in the past.  No abdominal pain.  No dysuria.  No known sick contacts.   Symptoms are worse with exertion.  No alleviating factors.       Past Medical History:  Diagnosis Date  . Anemia   . Arthritis   . Asthma   . Complication of anesthesia   . COPD (chronic obstructive pulmonary disease) ([place])   . Crohn's disease ([place])   . Depression    after death of husband  . Hyperlipidemia   . Hypertension   . Myocardial infarction ([place])   . Osteopenia   . Peptic ulcer disease   . Pneumonia   . PONV (postoperative nausea and vomiting)     Patient Active Problem List   Diagnosis Date Noted  . Acute on chronic diastolic CHF (congestive heart failure) ([place]) 08/25/2019  . HLD (hyperlipidemia) 08/25/2019  . Depression   . CAD (coronary artery disease)   . Elevated troponin   . Hypokalemia   . Acute on chronic respiratory failure with hypoxia ([place])   . 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 SBO (small  bowel obstruction) ([place]) 06/17/2019  . Crohn's disease of small intestine with intestinal obstruction ([place])   . COPD exacerbation ([place]) 10/20/2018  . COPD (chronic obstructive pulmonary disease) ([place]) 04/22/2016  . Acute bronchitis 04/17/2016  . Sepsis ([place]) 04/17/2016  . Peroneal tendonitis 07/18/2015  . Pneumonia 04/19/2015  . CAP (community acquired pneumonia) 11/02/2014    Past Surgical History:  Procedure Laterality Date  . ABDOMINAL SURGERY    . CARDIAC CATHETERIZATION     with stent  . COLONOSCOPY WITH PROPOFOL N/A 09/27/2014   Procedure: COLONOSCOPY WITH PROPOFOL;  Surgeon: [name], MD;  Location: [facility] ENDOSCOPY;  Service: Gastroenterology;  Laterality: N/A;  . COLONOSCOPY WITH PROPOFOL N/A 06/21/2019   Procedure: COLONOSCOPY WITH PROPOFOL;  Surgeon: Lin Landsman, MD;  Location[facility] ENDOSCOPY;  Service: Gastroenterology;  Laterality: N/A;  . EYE SURGERY     bilateral cataract surgeries  . SHOULDER SURGERY Right    x 2   . SMALL INTESTINE SURGERY      Prior to Admission medications   Medication Sig Start Date End Date Taking? Authorizing Provider  Adalimumab (HUMIRA) 40 MG/0.8ML PSKT Inject 40 mg into the skin every 14 (fourteen) days.     [provider]  albuterol (PROVENTIL HFA;VENTOLIN HFA) 108 (90 BASE) MCG/ACT inhaler Inhale 2 puffs into the lungs every 6 (six) hours as needed for wheezing  or shortness of breath.    [provider]  amitriptyline (ELAVIL) 10 MG tablet Take 20 mg by mouth at bedtime.    [provider]  amLODipine-benazepril (LOTREL) 5-20 MG per capsule Take 1 capsule by mouth daily.     [provider]  ANORO ELLIPTA 62.5-25 MCG/INH AEPB Inhale 1 puff into the lungs daily. 05/29/19   [provider]  aspirin EC 81 MG tablet Take 81 mg by mouth daily.    [provider]  atorvastatin (LIPITOR) 80 MG tablet Take 80 mg by mouth at bedtime.     [provider]  azithromycin (ZITHROMAX)  250 MG tablet Take as directed 10/23/18   Fritzi Mandes, MD  benzonatate (TESSALON) 100 MG capsule Take by mouth 3 (three) times daily as needed for cough.    [provider]  Calcium-Phosphorus-Vitamin D (CITRACAL +D3 PO) Take 1 tablet by mouth daily.    [provider]  clopidogrel (PLAVIX) 75 MG tablet Take 75 mg by mouth daily.    [provider]  Cyanocobalamin (B-12 PO) Take 1 tablet by mouth daily. Triple B-12    [provider]  doxycycline (VIBRAMYCIN) 100 MG capsule Take 100 mg by mouth 2 (two) times daily. 08/21/19   [provider]  esomeprazole (NEXIUM) 40 MG capsule Take 40 mg by mouth daily. 05/29/19   [provider]  Fluticasone-Salmeterol (ADVAIR) 250-50 MCG/DOSE AEPB Inhale 1 puff into the lungs 2 (two) times daily.    [provider]  furosemide (LASIX) 20 MG tablet Take 1 tablet (20 mg total) by mouth daily. 10/23/18   Fritzi Mandes, MD  HYDROMET 5-1.5 MG/5ML syrup Take 5 mLs by mouth every 6 (six) hours as needed. 06/14/19   [provider]  leflunomide (ARAVA) 10 MG tablet Take 10 mg by mouth daily. 03/29/19   [provider]  mesalamine (PENTASA) 500 MG CR capsule Take 1,000 mg by mouth 2 (two) times daily.    [provider]  methylPREDNISolone (MEDROL DOSEPAK) 4 MG TBPK tablet Take 4 mg by mouth daily. 06/14/19   [provider]  metoprolol succinate (TOPROL-XL) 25 MG 24 hr tablet Take 25 mg by mouth daily.    [provider]  Multiple Vitamin (MULTIVITAMIN WITH MINERALS) TABS tablet Take 1 tablet by mouth daily.    [provider]  nitroGLYCERIN (NITROSTAT) 0.4 MG SL tablet Place 0.4 mg under the tongue every 5 (five) minutes as needed for chest pain.    [provider]  potassium chloride (K-DUR,KLOR-CON) 10 MEQ tablet Take 10 mEq by mouth daily.     [provider]  potassium chloride (KLOR-CON) 10 MEQ tablet Take 10 mEq by mouth daily. 05/29/19    [provider]  predniSONE (DELTASONE) 1 MG tablet Take 2 mg by mouth at bedtime. 07/24/19   [provider]  risedronate (ACTONEL) 150 MG tablet Take 150 mg by mouth every 30 (thirty) days. with water on empty stomach, nothing by mouth or lie down for next 30 minutes.    [provider]  senna (SENOKOT) 8.6 MG tablet Take 2 tablets by mouth at bedtime.     [provider]  thiamine (VITAMIN B-1) 100 MG tablet Take 100 mg by mouth daily.    [provider]  vitamin E 400 UNIT capsule Take 400 Units by mouth daily.    [provider]    Allergies Penicillins, Augmentin [amoxicillin-pot clavulanate], Indocin [indomethacin], Lodine [etodolac], Methotrexate derivatives, Gold, and  Zantac [ranitidine hcl]  Family History  Problem Relation Age of Onset  . Hypertension Other     Social History Social History   Tobacco Use  . Smoking status: Former Smoker    Quit date: 1974    Years since quitting: 47.4  . Smokeless tobacco: Never Used  Substance Use Topics  . Alcohol use: Yes    Comment: occassional  . Drug use: No    Review of Systems  Review of Systems  Constitutional: Positive for chills and fatigue. Negative for fever.  HENT: Negative for congestion and sore throat.   Eyes: Negative for visual disturbance.  Respiratory: Positive for cough and wheezing. Negative for shortness of breath.   Cardiovascular: Negative for chest pain.  Gastrointestinal: Negative for abdominal pain, diarrhea, nausea and vomiting.  Genitourinary: Negative for flank pain.  Musculoskeletal: Negative for back pain and neck pain.  Skin: Negative for rash and wound.  Neurological: Positive for weakness.  All other systems reviewed and are negative.    ____________________________________________  PHYSICAL EXAM:      VITAL SIGNS: ED Triage Vitals [08/25/19 1020]  Enc Vitals Group     BP      Pulse      Resp      Temp      Temp src      SpO2        Weight 170 lb (77.1 kg)     Height 5' 6"  (1.676 m)     Head Circumference      Peak Flow      Pain Score 0     Pain Loc      Pain Edu?      Excl. in [place]?      Physical Exam Vitals and nursing note reviewed.  Constitutional:      General: She is not in acute distress.    Appearance: She is well-developed.  HENT:     Head: Normocephalic and atraumatic.  Eyes:     Conjunctiva/sclera: Conjunctivae normal.  Cardiovascular:     Rate and Rhythm: Normal rate and regular rhythm.     Heart sounds: Normal heart sounds. No murmur heard.  No friction rub.  Pulmonary:     Effort: Pulmonary effort is normal. No respiratory distress.     Breath sounds: Examination of the right-upper field reveals wheezing. Examination of the left-upper field reveals wheezing. Examination of the right-middle field reveals wheezing. Examination of the left-middle field reveals wheezing. Examination of the right-lower field reveals wheezing and rales. Examination of the left-lower field reveals wheezing and rales. Wheezing and rales present.  Abdominal:     General: There is no distension.     Palpations: Abdomen is soft.     Tenderness: There is no abdominal tenderness.  Musculoskeletal:     Cervical back: Neck supple.  Skin:    General: Skin is warm.     Capillary Refill: Capillary refill takes less than 2 seconds.  Neurological:     Mental Status: She is alert and oriented to person, place, and time.     Motor: No abnormal muscle tone.       ____________________________________________   LABS (all labs ordered are listed, but only abnormal results are displayed)  Labs Reviewed  CBC WITH DIFFERENTIAL/PLATELET - Abnormal; Notable for the following components:      Result Value   Abs Immature Granulocytes 0.10 (*)    All other components within normal limits  COMPREHENSIVE METABOLIC PANEL - Abnormal; Notable for the  following components:   Potassium 2.9 (*)    Glucose, Bld 110 (*)    Calcium  8.0 (*)    Total Protein 5.8 (*)    All other components within normal limits  BRAIN NATRIURETIC PEPTIDE - Abnormal; Notable for the following components:   B Natriuretic Peptide 635.0 (*)    All other components within normal limits  TROPONIN I (HIGH SENSITIVITY) - Abnormal; Notable for the following components:   Troponin I (High Sensitivity) 75 (*)    All other components within normal limits  SARS CORONAVIRUS 2 BY RT PCR (HOSPITAL ORDER, [facility])  CULTURE, BLOOD (ROUTINE X 2)  CULTURE, BLOOD (ROUTINE X 2)  LACTIC ACID, PLASMA  TROPONIN I (HIGH SENSITIVITY)    ____________________________________________  EKG:  ________________________________________  RADIOLOGY All imaging, including plain films, CT scans, and ultrasounds, independently reviewed by me, and interpretations confirmed via formal radiology reads.  ED MD interpretation:   CXR: Diffuse interstitial marking in lungs, likely pulm edema, less likely atelectasis  Official radiology report(s): DG Chest Portable 1 View  Result Date: 08/25/2019 CLINICAL DATA:  Worsening shortness of breath and cough for 3 days. History of asthma. EXAM: PORTABLE CHEST 1 VIEW COMPARISON:  October 21, 2018 FINDINGS: The right hemidiaphragm is elevated. The heart, hila, and mediastinum are unchanged. Increased interstitial markings are seen in the lungs. Mildly more focal opacity in left base. Mild atelectasis in the right base. No nodules or masses. No other acute abnormalities. IMPRESSION: 1. Diffuse increased interstitial opacities may represent pulmonary edema versus atypical infection. More focal opacity in left base could represent atelectasis or developing infiltrate. Recommend clinical correlation and short-term follow-up imaging to ensure resolution of findings. Electronically Signed   By: Dorise Bullion III M.D   On: 08/25/2019 11:09     ____________________________________________  PROCEDURES   Procedure(s) performed (including Critical Care):  .1-3 Lead EKG Interpretation Performed by: Duffy Bruce, MD Authorized by: Duffy Bruce, MD     Interpretation: normal     ECG rate:  90-100   ECG rate assessment: normal     Rhythm: sinus rhythm     Ectopy: none     Conduction: normal   Comments:     Indication: SOB, hypoxia    ____________________________________________  INITIAL IMPRESSION / MDM / [place] / ED COURSE  As part of my medical decision making, I reviewed the following data within the [place] notes reviewed and incorporated, Old chart reviewed, Notes from prior ED visits, and [place] Controlled Substance Database       *VILLA BURGIN was evaluated in Emergency Department on 08/25/2019 for the symptoms described in the history of present illness. She was evaluated in the context of the global COVID-19 pandemic, which necessitated consideration that the patient might be at risk for infection with the SARS-CoV-2 virus that causes COVID-19. Institutional protocols and algorithms that pertain to the evaluation of patients at risk for COVID-19 are in a state of rapid change based on information released by regulatory bodies including the CDC and federal and state organizations. These policies and algorithms were followed during the patient's care in the ED.  Some ED evaluations and interventions may be delayed as a result of limited staffing during the pandemic.*     Medical Decision Making:  85 yo F here with acute on chronic shortness of breath.  Clinically, patient appears overtly hypervolemic with pitting edema and bilateral rails.  She has some mild  wheezing but I suspect this is also chronic secondary to her known COPD.  She is satting well on her baseline 3 L nasal cannula.  Chest x-ray shows interstitial edema.  No leukocytosis noted.  Mild left shift likely  from her recent increase in steroid use.  CMP shows hypokalemia which has been replaced.  BNP elevated and troponin mildly elevated, with no chest pain.  Suspect primarily CHF, will start IV Lasix and admit.  ____________________________________________  FINAL CLINICAL IMPRESSION(S) / ED DIAGNOSES  Final diagnoses:  Acute on chronic systolic congestive heart failure (HCC)  Acute on chronic respiratory failure with hypoxia (HCC)     MEDICATIONS GIVEN DURING THIS VISIT:  Medications  furosemide (LASIX) injection 40 mg (has no administration in time range)  potassium chloride 10 mEq in 100 mL IVPB (has no administration in time range)  potassium chloride SA (KLOR-CON) CR tablet 40 mEq (has no administration in time range)  magnesium sulfate IVPB 1 g 100 mL (has no administration in time range)  ipratropium-albuterol (DUONEB) 0.5-2.5 (3) MG/3ML nebulizer solution 3 mL (has no administration in time range)  albuterol (PROVENTIL) (2.5 MG/3ML) 0.083% nebulizer solution 2.5 mg (has no administration in time range)  dextromethorphan-guaiFENesin (MUCINEX DM) 30-600 MG per 12 hr tablet 1 tablet (has no administration in time range)  methylPREDNISolone sodium succinate (SOLU-MEDROL) 40 mg/mL injection 40 mg (has no administration in time range)  ipratropium-albuterol (DUONEB) 0.5-2.5 (3) MG/3ML nebulizer solution 3 mL (3 mLs Nebulization Given 08/25/19 1058)  ipratropium-albuterol (DUONEB) 0.5-2.5 (3) MG/3ML nebulizer solution 3 mL (3 mLs Nebulization Given 08/25/19 1058)  ipratropium-albuterol (DUONEB) 0.5-2.5 (3) MG/3ML nebulizer solution 3 mL (3 mLs Nebulization Given 08/25/19 1058)     ED Discharge Orders    None       Note:  This document was prepared using Dragon voice recognition software and may include unintentional dictation errors.   Duffy Bruce, MD 08/25/19 1310

## 2019-08-26 ENCOUNTER — Inpatient Hospital Stay
Admit: 2019-08-26 | Discharge: 2019-08-26 | Disposition: A | Discharge status: Home / Self Care | Payer: Medicare Other | Attending: Internal Medicine | Admitting: Internal Medicine

## 2019-08-26 DIAGNOSIS — I5023 Acute on chronic systolic (congestive) heart failure: Secondary | ICD-10-CM

## 2019-08-26 LAB — HEMOGLOBIN A1C
Hgb A1c MFr Bld: 6.7 % — ABNORMAL HIGH (ref 4.8–5.6)
Mean Plasma Glucose: 145.59 mg/dL

## 2019-08-26 LAB — MAGNESIUM: Magnesium: 1.5 mg/dL — ABNORMAL LOW (ref 1.7–2.4)

## 2019-08-26 LAB — BASIC METABOLIC PANEL
Anion gap: 13 (ref 5–15)
BUN: 21 mg/dL (ref 8–23)
CO2: 23 mmol/L (ref 22–32)
Calcium: 7.9 mg/dL — ABNORMAL LOW (ref 8.9–10.3)
Chloride: 106 mmol/L (ref 98–111)
Creatinine, Ser: 0.86 mg/dL (ref 0.44–1.00)
GFR calc Af Amer: >60 mL/min (ref >60)
GFR calc non Af Amer: >60 mL/min (ref >60)
Glucose, Bld: 182 mg/dL — ABNORMAL HIGH (ref 70–99)
Potassium: 3.7 mmol/L (ref 3.5–5.1)
Sodium: 142 mmol/L (ref 135–145)

## 2019-08-26 LAB — LIPID PANEL
Cholesterol: 131 mg/dL (ref 0–200)
HDL: 53 mg/dL (ref >40)
LDL Cholesterol: 62 mg/dL (ref 0–99)
Total CHOL/HDL Ratio: 2.5 RATIO
Triglycerides: 78 mg/dL (ref <150)
VLDL: 16 mg/dL (ref 0–40)

## 2019-08-26 LAB — CBC
HCT: 35.9 % — ABNORMAL LOW (ref 36.0–46.0)
Hemoglobin: 11.8 g/dL — ABNORMAL LOW (ref 12.0–15.0)
MCH: 31.4 pg (ref 26.0–34.0)
MCHC: 32.9 g/dL (ref 30.0–36.0)
MCV: 95.5 fL (ref 80.0–100.0)
Platelets: 189 10*3/uL (ref 150–400)
RBC: 3.76 MIL/uL — ABNORMAL LOW (ref 3.87–5.11)
RDW: 14.3 % (ref 11.5–15.5)
WBC: 5.6 10*3/uL (ref 4.0–10.5)
nRBC: 0 % (ref 0.0–0.2)

## 2019-08-26 LAB — ECHOCARDIOGRAM COMPLETE
Height: 66 in
Weight: 2720 oz

## 2019-08-26 MED ORDER — ARFORMOTEROL TARTRATE 15 MCG/2ML IN NEBU
15.0000 ug | INHALATION_SOLUTION | Freq: Two times a day (BID) | RESPIRATORY_TRACT | Status: DC
  Administered 2019-08-26 – 2019-08-27 (×2): 15 ug via RESPIRATORY_TRACT
  Filled 2019-08-26 (×4): qty 2

## 2019-08-26 MED ORDER — GUAIFENESIN-DM 100-10 MG/5ML PO SYRP: 5.0000 mL | ORAL_SOLUTION | ORAL | Status: DC | PRN

## 2019-08-26 MED ORDER — MAGNESIUM SULFATE 50 % IJ SOLN: 4.0000 g | Freq: Once | INTRAVENOUS | Status: DC

## 2019-08-26 MED ORDER — MAGNESIUM SULFATE 4 GM/100ML IV SOLN
4.0000 g | Freq: Once | INTRAVENOUS | Status: AC
  Administered 2019-08-26: 4 g via INTRAVENOUS
  Filled 2019-08-26: qty 100

## 2019-08-26 MED ORDER — BUDESONIDE 0.25 MG/2ML IN SUSP
0.2500 mg | Freq: Two times a day (BID) | RESPIRATORY_TRACT | Status: DC
  Administered 2019-08-26 – 2019-08-27 (×3): 0.25 mg via RESPIRATORY_TRACT
  Filled 2019-08-26 (×2): qty 2

## 2019-08-26 MED ORDER — BENZONATATE 100 MG PO CAPS
100.0000 mg | ORAL_CAPSULE | Freq: Three times a day (TID) | ORAL | Status: DC
  Administered 2019-08-26 – 2019-08-27 (×3): 100 mg via ORAL
  Filled 2019-08-26 (×3): qty 1

## 2019-08-26 NOTE — Evaluation (Signed)
Occupational Therapy Evaluation Patient Details Name: Raven [name] MRN: [id number] DOB: 1934/10/02 Today's Date: 08/26/2019    History of Present Illness Pt 85 yrs old female refer for OT eval after she was admited thru ED for  evaluation of acute diastolic congestive heart failure.  Patient has a history of coronary artery disease and COPD on 3 L of oxygen at home.  Presents with 3-day history of increasing shortness of breath, he did 4 days ago with doxycycline and thyroids by her pulmonologist, presented to [facility] ED 08/25/2019 with nausea, vomiting and loose stools.  Oxygen saturation was 91 to 94% on 3 L, chest x-ray revealed interstitial pulmonary edema and possible left basilar opacity, BNP was 635, hs troponin was 75, 69, 46, 52.  ECG revealed sinus rhythm with left bundle branch block.  Most recent 2D echocardiogram 10/26/2018 revealed LVEF 50 to 55%.  Patient treated with intravenous furosemide, intravenous steroids, and bronchodilators with initial clinical improvement.  Patient denies chest pain.   Clinical Impression   Pt in bed upon arrival of OT. Motivated to participate in OT eval - per pt she was on 3L of O2 at home and independent in ADL's and IADL's. She did not had any falls the last 6 months and do not use AD. She drives and son lives close by. Pt was independent during evaluation in bed mobility and UB and LB ADL's - do get SOB - was CG for functional mobility to bathrooom and report effort level was 10/10 - she was initially unsteady  - but could be from being in bed since admission. Pt can benefit from OT services of energy conservation principles and dynamic balance    Follow Up Recommendations  No OT follow up    Equipment Recommendations       Recommendations for Other Services PT consult     Precautions / Restrictions Precautions Precautions: Fall      Mobility Bed Mobility Overal bed mobility: Independent                Transfers Overall transfer  level: Needs assistance Equipment used: None             General transfer comment: Sit<> stand S and transfers to bathroom - was unsteady initially - no AD - needed CG with O2 and IV lines    Balance Overall balance assessment: Needs assistance Sitting-balance support: Feet supported Sitting balance-Leahy Scale: Normal       Standing balance-Leahy Scale: Fair Standing balance comment: initially unsteady - but corrected self                           ADL either performed or assessed with clinical judgement   ADL Overall ADL's : Independent                                       General ADL Comments: UB dressing and bathing Independent, LB dressing and bathing I sitting EOB including foot wear  - take time and restbreaks - toiletting - was unsteady initially - but was in bed for about 24-48 hrs - did not use AD -CG toiletting     Vision Baseline Vision/History: Wears glasses Wears Glasses: Reading only Patient Visual Report: No change from baseline       Perception     Praxis      Pertinent Vitals/Pain Pain  Assessment: 0-10 Pain Score: 2  Pain Location: bilateral shoulders -has RC problems Pain Descriptors / Indicators: Aching Pain Intervention(s): Limited activity within patient's tolerance     Hand Dominance     Extremity/Trunk Assessment Upper Extremity Assessment Upper Extremity Assessment: LUE deficits/detail;RUE deficits/detail (bilateral shoulders decrease flexion and ABD -report RC issues - and can pick up over head if L help the R - but it does not limit her in her ADL's and light house work)           Research officer, trade union: No difficulties   Cognition Arousal/Alertness: Awake/alert Behavior During Therapy: WFL for tasks assessed/performed Overall Cognitive Status: Within Functional Limits for tasks assessed                                     General Comments       Exercises      Shoulder Instructions      Home Living Family/patient expects to be discharged to:: Private residence Living Arrangements: Alone Available Help at Discharge: Family (son lives about 2 miles away) Type of Home: House Home Access: Stairs to enter (3 with railing -use back stairs) Technical brewer of Steps: 3 Entrance Stairs-Rails: Right Home Layout: One level     Bathroom Shower/Tub: Walk-in shower (with built in seat and railing)   Bathroom Toilet: Handicapped height Bathroom Accessibility: Yes   Home Equipment: Cane - single point;Other (comment)   Additional Comments: Patient reports she does not use any AD at home. Only uses 3L of 02 via nasal cannula. And denies any falls the last 6 months      Prior Functioning/Environment Level of Independence: Independent        Comments: Per patient she is independent with all ADLs and iADLs. She just bought new car the last week.  Reports she drives and prior to COVID was going to church/active in the community. Feels like she has lost balance/become more weaker in her legs and would like HHPT        OT Problem List: Decreased strength;Decreased knowledge of use of DME or AE;Impaired balance (sitting and/or standing);Decreased activity tolerance      OT Treatment/Interventions: Self-care/ADL training;Therapeutic exercise;Patient/family education;Energy conservation;DME and/or AE instruction    OT Goals(Current goals can be found in the care plan section) Acute Rehab OT Goals Patient Stated Goal: Want to go home - but would like HHPT to increase leg strength OT Goal Formulation: With patient Time For Goal Achievement: 09/08/19 Potential to Achieve Goals: Good  OT Frequency: Min 2X/week   Barriers to D/C:            Co-evaluation              AM-PAC OT "6 Clicks" Daily Activity     Outcome Measure Help from another person eating meals?: None Help from another person taking care of personal grooming?:  None Help from another person toileting, which includes using toliet, bedpan, or urinal?: A Little Help from another person bathing (including washing, rinsing, drying)?: None Help from another person to put on and taking off regular upper body clothing?: None Help from another person to put on and taking off regular lower body clothing?: None 6 Click Score: 23   End of Session Equipment Utilized During Treatment: Gait belt  Activity Tolerance: Patient tolerated treatment well Patient left: in bed;with call bell/phone within reach;with bed alarm set  OT Visit Diagnosis: Unsteadiness  on feet (R26.81);Muscle weakness (generalized) (M62.81)                Time: 1335-1400 OT Time Calculation (min): 25 min Charges:  OT General Charges $OT Visit: 1 Visit OT Evaluation $OT Eval Low Complexity: 1 Low    Chriss Mannan OTR/L,CLT 08/26/2019, 2:25 PM

## 2019-08-26 NOTE — Plan of Care (Signed)
o

## 2019-08-26 NOTE — Consult Note (Signed)
[facility] Cardiology  CARDIOLOGY CONSULT NOTE  Patient ID: Raven [name] MRN: [id number] DOB/AGE: 11/08/1934 85 y.o.  Admit date: 08/25/2019 Referring Physician Blaine Hamper Primary Physician [facility] Primary Cardiologist Danetta Prom Reason for Consultation congestive heart failure  HPI: Patient referred for evaluation of acute diastolic congestive heart failure.  Patient has a history of coronary artery disease and COPD on 3 L of oxygen at home.  Presents with 3-day history of increasing shortness of breath, he did 4 days ago with doxycycline and thyroids by her pulmonologist, presented to [facility] ED 08/25/2019 with nausea, vomiting and loose stools.  Oxygen saturation was 91 to 94% on 3 L, chest x-ray revealed interstitial pulmonary edema and possible left basilar opacity, BNP was 635, hs troponin was 75, 69, 46, 52.  ECG revealed sinus rhythm with left bundle branch block.  Most recent 2D echocardiogram 10/26/2018 revealed LVEF 50 to 55%.  Patient treated with intravenous furosemide, intravenous steroids, and bronchodilators with initial clinical improvement.  Patient denies chest pain.  Review of systems complete and found to be negative unless listed above     Past Medical History:  Diagnosis Date  . Anemia   . Arthritis   . Asthma   . Complication of anesthesia   . COPD (chronic obstructive pulmonary disease) ([place])   . Crohn's disease ([place])   . Depression    after death of husband  . Hyperlipidemia   . Hypertension   . Myocardial infarction ([place])   . Osteopenia   . Peptic ulcer disease   . Pneumonia   . PONV (postoperative nausea and vomiting)     Past Surgical History:  Procedure Laterality Date  . ABDOMINAL SURGERY    . CARDIAC CATHETERIZATION     with stent  . COLONOSCOPY WITH PROPOFOL N/A 09/27/2014   Procedure: COLONOSCOPY WITH PROPOFOL;  Surgeon: [name], MD;  Location: [facility] ENDOSCOPY;  Service: Gastroenterology;  Laterality: N/A;  . COLONOSCOPY WITH PROPOFOL N/A 06/21/2019    Procedure: COLONOSCOPY WITH PROPOFOL;  Surgeon: Lin Landsman, MD;  Location: [facility] ENDOSCOPY;  Service: Gastroenterology;  Laterality: N/A;  . EYE SURGERY     bilateral cataract surgeries  . SHOULDER SURGERY Right    x 2   . SMALL INTESTINE SURGERY      Medications Prior to Admission  Medication Sig Dispense Refill Last Dose  . acetaminophen (TYLENOL) 500 MG tablet Take 500 mg by mouth every 6 (six) hours as needed.   Past Week at PRN  . Adalimumab (HUMIRA) 40 MG/0.8ML PSKT Inject 40 mg into the skin every 14 (fourteen) days.    08/11/2019 at 1000  . albuterol (PROVENTIL HFA;VENTOLIN HFA) 108 (90 BASE) MCG/ACT inhaler Inhale 2 puffs into the lungs every 6 (six) hours as needed for wheezing or shortness of breath.   Past Month at PRN  . amitriptyline (ELAVIL) 10 MG tablet Take 20 mg by mouth at bedtime.   Past Week at Unknown time  . amLODipine-benazepril (LOTREL) 5-20 MG per capsule Take 1 capsule by mouth daily.    Past Week at 0900  . ANORO ELLIPTA 62.5-25 MCG/INH AEPB Inhale 1 puff into the lungs daily.   08/24/2019 at 2130  . aspirin EC 81 MG tablet Take 81 mg by mouth daily.   Past Week at 0900  . atorvastatin (LIPITOR) 80 MG tablet Take 80 mg by mouth at bedtime.    Past Week at 0900  . Calcium-Phosphorus-Vitamin D (CITRACAL +D3 PO) Take 1 tablet by mouth daily.   Past  Week at 0900  . Cobalamin Combinations (VITAMIN B12-FOLIC ACID) 329-518 MCG TABS Take by mouth daily.   Past Week at 0900  . doxycycline (VIBRAMYCIN) 100 MG capsule Take 100 mg by mouth 2 (two) times daily.   08/25/2019 at Unknown time  . esomeprazole (NEXIUM) 40 MG capsule Take 40 mg by mouth daily.   Past Week at 0900  . furosemide (LASIX) 20 MG tablet Take 1 tablet (20 mg total) by mouth daily. 30 tablet 0 Past Week at 0900  . hydrochlorothiazide (HYDRODIURIL) 25 MG tablet Take 25 mg by mouth daily.   Past Week at 0900  . leflunomide (ARAVA) 10 MG tablet Take 10 mg by mouth daily.   Past Week at 0900  . mesalamine  (PENTASA) 500 MG CR capsule Take 1,000 mg by mouth 2 (two) times daily.   Past Week at 2100  . metoprolol succinate (TOPROL-XL) 50 MG 24 hr tablet Take 50 mg by mouth daily.    Past Week at 0900  . Multiple Vitamin (MULTIVITAMIN WITH MINERALS) TABS tablet Take 1 tablet by mouth daily.   Past Week at 0900  . potassium chloride (KLOR-CON) 10 MEQ tablet Take 10 mEq by mouth daily.   Past Week at 0900  . predniSONE (DELTASONE) 1 MG tablet Take by mouth. Take 2 tablets at bedtime   Past Week at 2100  . predniSONE (DELTASONE) 5 MG tablet Take 5 mg by mouth daily with breakfast.   Past Week at 0900  . risedronate (ACTONEL) 150 MG tablet Take 150 mg by mouth every 30 (thirty) days. with water on empty stomach, nothing by mouth or lie down for next 30 minutes.   Past Week at 0830  . senna (SENOKOT) 8.6 MG tablet Take 2 tablets by mouth at bedtime.    Past Week at 2100  . thiamine (VITAMIN B-1) 100 MG tablet Take 100 mg by mouth daily.   Past Month at Unknown time  . vitamin E 400 UNIT capsule Take 400 Units by mouth daily.   Past Week at Unknown time  . Fluticasone-Salmeterol (ADVAIR) 250-50 MCG/DOSE AEPB Inhale 1 puff into the lungs 2 (two) times daily. (Patient not taking: Reported on 08/25/2019)   Not Taking  . methylPREDNISolone (MEDROL DOSEPAK) 4 MG TBPK tablet Take 4 mg by mouth daily.    08/21/2019  . nitroGLYCERIN (NITROSTAT) 0.4 MG SL tablet Place 0.4 mg under the tongue every 5 (five) minutes as needed for chest pain.    at PRN   Social History   Socioeconomic History  . Marital status: Widowed    Spouse name: Not on file  . Number of children: Not on file  . Years of education: Not on file  . Highest education level: Not on file  Occupational History  . Not on file  Tobacco Use  . Smoking status: Former Smoker    Quit date: 1974    Years since quitting: 47.4  . Smokeless tobacco: Never Used  Substance and Sexual Activity  . Alcohol use: Yes    Comment: occassional  . Drug use: No  .  Sexual activity: Never    Birth control/protection: Abstinence  Other Topics Concern  . Not on file  Social History Narrative  . Not on file   Social Determinants of Health   Financial Resource Strain:   . Difficulty of Paying Living Expenses:   Food Insecurity:   . Worried About Charity fundraiser in the Last Year:   . [employer]  of Food in the Last Year:   Transportation Needs:   . Film/video editor (Medical):   [name] Lack of Transportation (Non-Medical):   Physical Activity:   . Days of Exercise per Week:   . Minutes of Exercise per Session:   Stress:   . Feeling of Stress :   Social Connections:   . Frequency of Communication with Friends and Family:   . Frequency of Social Gatherings with Friends and Family:   . Attends Religious Services:   . Active Member of Clubs or Organizations:   . Attends Archivist Meetings:   [name] Marital Status:   Intimate Partner Violence:   . Fear of Current or Ex-Partner:   . Emotionally Abused:   [name] Physically Abused:   . Sexually Abused:     Family History  Problem Relation Age of Onset  . Hypertension Other       Review of systems complete and found to be negative unless listed above      PHYSICAL EXAM  General: Well developed, well nourished, in no acute distress HEENT:  Normocephalic and atramatic Neck:  No JVD.  Lungs: Clear bilaterally to auscultation and percussion. Heart: HRRR . Normal S1 and S2 without gallops or murmurs.  Abdomen: Bowel sounds are positive, abdomen soft and non-tender  Msk:  Back normal, normal gait. Normal strength and tone for age. Extremities: No clubbing, cyanosis or edema.   Neuro: Alert and oriented X 3. Psych:  Good affect, responds appropriately  Labs:   Lab Results  Component Value Date   WBC 5.6 08/26/2019   HGB 11.8 (L) 08/26/2019   HCT 35.9 (L) 08/26/2019   MCV 95.5 08/26/2019   PLT 189 08/26/2019    Recent Labs  Lab 08/25/19 1041 08/25/19 1041 08/26/19 0452  NA  143   < > 142  K 2.9*   < > 3.7  CL 107   < > 106  CO2 25   < > 23  BUN 18   < > 21  CREATININE 0.84   < > 0.86  CALCIUM 8.0*   < > 7.9*  PROT 5.8*  --   --   BILITOT 1.0  --   --   ALKPHOS 76  --   --   ALT 35  --   --   AST 18  --   --   GLUCOSE 110*   < > 182*   < > = values in this interval not displayed.   Lab Results  Component Value Date   CKTOTAL 266 (H) 11/08/2014   TROPONINI 0.03 (HH) 04/22/2016    Lab Results  Component Value Date   CHOL 131 08/26/2019   CHOL 129 06/09/2014   Lab Results  Component Value Date   HDL 53 08/26/2019   HDL 52 06/09/2014   Lab Results  Component Value Date   LDLCALC 62 08/26/2019   LDLCALC 60 06/09/2014   Lab Results  Component Value Date   TRIG 78 08/26/2019   TRIG 87 06/09/2014   Lab Results  Component Value Date   CHOLHDL 2.5 08/26/2019   No results found for: LDLDIRECT    Radiology: DG Chest Portable 1 View  Result Date: 08/25/2019 CLINICAL DATA:  Worsening shortness of breath and cough for 3 days. History of asthma. EXAM: PORTABLE CHEST 1 VIEW COMPARISON:  October 21, 2018 FINDINGS: The right hemidiaphragm is elevated. The heart, hila, and mediastinum are unchanged. Increased interstitial markings are seen in the lungs.  Mildly more focal opacity in left base. Mild atelectasis in the right base. No nodules or masses. No other acute abnormalities. IMPRESSION: 1. Diffuse increased interstitial opacities may represent pulmonary edema versus atypical infection. More focal opacity in left base could represent atelectasis or developing infiltrate. Recommend clinical correlation and short-term follow-up imaging to ensure resolution of findings. Electronically Signed   By: Dorise Bullion III M.D   On: 08/25/2019 11:09    EKG: Sinus rhythm with left bundle branch block  ASSESSMENT AND PLAN:   1.  Respiratory failure, likely multifactorial, secondary to COPD exacerbation and acute diastolic congestive heart failure 2.  Acute  diastolic congestive heart failure, with known normal left ventricular function, treated with intravenous furosemide with initial clinical improvement 3.  COPD exacerbation 4.  CAD, history of non-STEMI, DES OM 16/06/2013, currently without chest pain, nondiagnostic ECG with sinus rhythm and left bundle branch block, mildly elevated high-sensitivity troponin (75, 69, 46, 52) without significant delta  Recommendations  1.  Agree with current therapy 2.  Continue diuresis 3.  Carefully monitor renal status 4.  Defer full dose anticoagulation 5.  Defer cardiac catheterization  Signed: Isaias Cowman MD,PhD, [facility] 08/26/2019, 9:26 AM

## 2019-08-26 NOTE — Evaluation (Signed)
Physical Therapy Evaluation Patient Details Name: Raven [name] MRN: [id number] DOB: January 19, 1935 Today's Date: 08/26/2019   History of Present Illness  Patient is 85 year old female with history of hypertension, hyperlipidemia, COPD on 3 L oxygen, asthma, PAD, depression, CAD, myocardial infarction, Crohn's disease, CHF, anemia, presented with shortness of breath.  Patient reported shortness of breath for the last 3 days, progressively worsening.  Patient was seen by her pulmonologist on Monday. Chest x-ray showed interstitial pulmonary edema.  Clinical Impression  Pt is a pleasant 85 year old F who was admitted for acute RF with hypoxia, with PMH as noted above. Pt performs bed mobility independently, transfers with supervision, and ambulation with CGA (without AD). Pt was previously independent with household and community ambulation without AD, using home O2 PRN (pt reports mostly at night, 2-3L). At start of session, pt attempts multiple times to get up and go to the bathroom on her own, stating she doesn't need oxygen, and forgetting she has an IV line; pt walks to bathroom without portable O2 (against advice of PT) and upon assessment, O2 saturation is 83%; donned O2, explained to patient importance of use of oxygen to maintain O2 sats >92%. During ambulation, with 2L O2 donned via [place], patient demos O2 ranging from 88-92%, improving with brief standing rest break. Pt tends to drift left and right during ambulation when support of IV pole is discontinued; recommend use of SPC for light support. Pt demonstrates deficits with safety awareness, activity tolerance, and balance requiring continued skilled PT intervention to promote safety with functional mobility.     Follow Up Recommendations Home health PT    Equipment Recommendations  None recommended by PT (pt has RW and cane)    Recommendations for Other Services       Precautions / Restrictions Precautions Precautions:  Fall Restrictions Weight Bearing Restrictions: No      Mobility  Bed Mobility Overal bed mobility: Independent                Transfers Overall transfer level: Needs assistance Equipment used: None Transfers: Sit to/from Stand Sit to Stand: Supervision         General transfer comment: Sit<> stand S and transfers to bathroom - was unsteady initially - no AD - needed CG with O2 and IV lines  Ambulation/Gait Ambulation/Gait assistance: Min guard Gait Distance (Feet): 250 Feet Assistive device: IV Pole (at times managing IV pole; though discontinued to better observe natural gait pattern, with pt becoming slightly less steady) Gait Pattern/deviations: Drifts right/left;Step-through pattern     General Gait Details: Initially pt managing IV pole; when discontinued IV pole, pt becomes slightly less steady and tends to drift more left and right. Gait otherwise normal.  Stairs            Wheelchair Mobility    Modified Rankin (Stroke Patients Only)       Balance Overall balance assessment: Needs assistance Sitting-balance support: Feet supported Sitting balance-Leahy Scale: Normal     Standing balance support: During functional activity;No upper extremity supported Standing balance-Leahy Scale: Fair Standing balance comment: Tends to drift left/right during gait without UE support; when turning, at times pt takes extra step to steady balance                             Pertinent Vitals/Pain Pain Assessment: No/denies pain Pain Score: 2  Pain Location: bilateral shoulders -has RC problems Pain Descriptors / Indicators: Aching  Pain Intervention(s): Monitored during session    Home Living Family/patient expects to be discharged to:: Private residence Living Arrangements: Alone Available Help at Discharge: Family;Available PRN/intermittently (son lives closest; daughter ~2 hours away) Type of Home: House Home Access: Stairs to enter Entrance  Stairs-Rails: Can reach both Entrance Stairs-Number of Steps: 3 Home Layout: One level Home Equipment: Cane - single point;Walker - 2 wheels;Grab bars - tub/shower Additional Comments: Patient reports she does not use any AD at home. Only uses 3L of 02 via nasal cannula. And denies any falls the last year    Prior Function Level of Independence: Independent         Comments: Per patient she is independent with all ADLs and iADLs. Reports she drives and prior to COVID was going to church/active in the community. Feels like she has lost balance/become more weaker in her legs and would like HHPT.     Hand Dominance        Extremity/Trunk Assessment   Upper Extremity Assessment Upper Extremity Assessment: Defer to OT evaluation    Lower Extremity Assessment Lower Extremity Assessment: Generalized weakness    Cervical / Trunk Assessment Cervical / Trunk Assessment: Normal  Communication   Communication: No difficulties  Cognition Arousal/Alertness: Awake/alert Behavior During Therapy: WFL for tasks assessed/performed Overall Cognitive Status: Within Functional Limits for tasks assessed                                 General Comments: Generally very pleasant and happy to participate, though poor safety awareness      General Comments      Exercises     Assessment/Plan    PT Assessment Patient needs continued PT services  PT Problem List Decreased mobility;Decreased safety awareness;Decreased activity tolerance       PT Treatment Interventions DME instruction;Therapeutic exercise;Gait training;Balance training;Stair training;Neuromuscular re-education;Functional mobility training;Patient/family education;Therapeutic activities    PT Goals (Current goals can be found in the Care Plan section)  Acute Rehab PT Goals Patient Stated Goal: Want to go home - but would like HHPT to increase strength and balance PT Goal Formulation: With patient Time For  Goal Achievement: 09/09/19 Potential to Achieve Goals: Good    Frequency Min 2X/week   Barriers to discharge        Co-evaluation               AM-PAC PT "6 Clicks" Mobility  Outcome Measure Help needed turning from your back to your side while in a flat bed without using bedrails?: A Little Help needed moving from lying on your back to sitting on the side of a flat bed without using bedrails?: A Little Help needed moving to and from a bed to a chair (including a wheelchair)?: A Little Help needed standing up from a chair using your arms (e.g., wheelchair or bedside chair)?: A Little Help needed to walk in hospital room?: A Little Help needed climbing 3-5 steps with a railing? : A Little 6 Click Score: 18    End of Session Equipment Utilized During Treatment: Oxygen;Gait belt Activity Tolerance: Patient tolerated treatment well Patient left: in chair;with call bell/phone within reach;with chair alarm set Nurse Communication: Mobility status PT Visit Diagnosis: Unsteadiness on feet (R26.81);Muscle weakness (generalized) (M62.81)    Time: 1856-3149 PT Time Calculation (min) (ACUTE ONLY): 40 min   Charges:   PT Evaluation $PT Eval Moderate Complexity: 1 Mod PT Treatments $Therapeutic Activity: 8-22  mins        Petra Kuba, PT, DPT 08/26/19, 4:14 PM

## 2019-08-26 NOTE — Progress Notes (Signed)
[facility]                                                                              Patient Demographics  Raven [name], is a 85 y.o. female, DOB - 1934-08-16, EGB:[id number]  Admit date - 08/25/2019   Admitting Physician Ivor Costa, MD  Outpatient Primary MD for the patient is Baxter Hire, MD  Outpatient specialists:   LOS - 1  days   Medical records reviewed and are as summarized below:    Chief Complaint  Patient presents with  . Cough  . Shortness of Breath       Brief summary   Patient is 85 year old female with history of hypertension, hyperlipidemia, COPD on 3 L oxygen, asthma, PAD, depression, CAD, myocardial infarction, Crohn's disease, CHF, anemia, presented with shortness of breath.  Patient reported shortness of breath for the last 3 days, progressively worsening.  No chest pain, fevers or chills but has coughing.  Patient was seen by her pulmonologist on Monday.    ED Course: pt was found to have BNP 635, troponin 75, lactic acid 1.6, WBC 9.0, pending COVID-19 PCR, potassium 2.9, renal function okay, temperature normal, blood pressure 142/62, heart rate 91, RR 15, oxygen saturation 91-94% on home level 3 L oxygen.  Chest x-ray showed interstitial pulmonary edema.  Patient is admitted to progressive bed as inpatient.   Assessment & Plan    Principal Problem:   Acute on chronic respiratory failure with hypoxia (HCC) -Likely due to the combination of acute on chronic diastolic CHF and COPD exacerbation.  Patient presented with elevated BNP 635, positive troponin, peripheral edema, chest x-ray with interstitial pulmonary edema consistent with CHF exacerbation.  Also noted to have diffuse bilateral wheezing -Continue IV Lasix 40 mg every 12 hours, strict I's and O's and daily weights -2D echo 10/21/2018 showed EF of 50 to 55%, impaired relaxation, will repeat 2D echo, cardiology consulted, appreciate recommendations -Negative balance of 1.53  L, fluid restriction   Principal problem Acute COPD exacerbation -Currently coughing and bilateral expiratory wheezing -Continue scheduled nebs, Pulmicort, Brovana, IV Solu-Medrol, Tessalon -Continue Zithromax. -O2 sats 100% on 2 L, home O2 evaluation  Elevated troponin, history of CAD -Follow 2D echo, likely due to demand ischemia secondary to CHF exacerbation. -EKG with LBBB, does not appear to be new -Trend troponin, cardiology following, appreciate recommendations  Hyperlipidemia Continue Lipitor   Essential hypertension BP currently stable, hold HCTZ as patient is on IV Lasix Continue metoprolol, lisinopril. DC lotrel -Continue hydralazine as needed  Depression -Stable, no SI or HI, continue amitriptyline  Crohn's disease Continue mesalamine, leflunomide  Hypokalemia Currently improved, 3.7  Hypomagnesemia Mag is 1.5, will replace  Code Status: Partial DVT Prophylaxis:  Lovenox Family Communication: Discussed all imaging results, lab results, explained to the patient  Disposition Plan:     Status is: Inpatient  Remains inpatient appropriate because:Inpatient level of care appropriate due to severity of illness   Dispo: The patient is from: Home              Anticipated d/c is to: Home  Anticipated d/c date is: 3 days              Patient currently is not medically stable to d/c.,  On IV Lasix, due to acute on chronic CHF, diffuse active wheezing      Time Spent in minutes   35 minutes  Procedures:  None  Consultants:   Cardiology  Antimicrobials:   Anti-infectives (From admission, onward)   Start     Dose/Rate Route Frequency Ordered Stop   08/26/19 1000  azithromycin (ZITHROMAX) tablet 250 mg     Discontinue    "Followed by" Linked Group Details   250 mg Oral Daily 08/25/19 2102 08/30/19 0959   08/25/19 2115  azithromycin (ZITHROMAX) tablet 500 mg       "Followed by" Linked Group Details   500 mg Oral Daily 08/25/19 2102  08/25/19 2200          Medications  Scheduled Meds: . amitriptyline  20 mg Oral QHS  . amLODipine  5 mg Oral Daily   And  . benazepril  20 mg Oral Daily  . arformoterol  15 mcg Nebulization BID  . aspirin EC  81 mg Oral Daily  . atorvastatin  80 mg Oral QHS  . azithromycin  250 mg Oral Daily  . benzonatate  100 mg Oral TID  . budesonide (PULMICORT) nebulizer solution  0.25 mg Nebulization BID  . calcium-vitamin D  1 tablet Oral Daily  . enoxaparin (LOVENOX) injection  40 mg Subcutaneous Q24H  . vitamin B-12  500 mcg Oral Daily   And  . folic acid  0.5 mg Oral Daily  . furosemide  40 mg Intravenous Q12H  . ipratropium-albuterol  3 mL Nebulization Q4H  . leflunomide  10 mg Oral Daily  . lisinopril  2.5 mg Oral Daily  . mesalamine  1,000 mg Oral BID  . methylPREDNISolone (SOLU-MEDROL) injection  40 mg Intravenous Q12H  . metoprolol succinate  50 mg Oral Daily  . multivitamin with minerals  1 tablet Oral Daily  . pantoprazole  40 mg Oral Daily  . sodium chloride flush  3 mL Intravenous Q12H  . thiamine  100 mg Oral Daily   Continuous Infusions: . sodium chloride     PRN Meds:.sodium chloride, acetaminophen, albuterol, guaiFENesin-dextromethorphan, hydrALAZINE, nitroGLYCERIN, ondansetron (ZOFRAN) IV, sodium chloride flush      Subjective:   Dahna Hattabaugh was seen and examined today.  Faint bilateral wheezing, still has shortness of breath, no change in baseline.  Coughing+. Patient denies dizziness, chest pain, abdominal pain, N/V/D/C, new weakness, numbess, tingling. No acute events overnight.    Objective:   Vitals:   08/26/19 0325 08/26/19 0737 08/26/19 1135 08/26/19 1142  BP: 126/80 133/61  127/70  Pulse: 78 86  80  Resp:  17  16  Temp: 97.8 F (36.6 C) 98.5 F (36.9 C)  98.1 F (36.7 C)  TempSrc: Oral     SpO2: 94% 97% 93% 100%  Weight:      Height:        Intake/Output Summary (Last 24 hours) at 08/26/2019 1211 Last data filed at 08/26/2019  2229 Gross per 24 hour  Intake 237 ml  Output 1775 ml  Net -1538 ml     Wt Readings from Last 3 Encounters:  08/25/19 77.1 kg  06/17/19 77.1 kg  10/23/18 77.5 kg     Exam  General: Alert and oriented x 3, NAD  Cardiovascular: S1 S2 auscultated, no murmurs, RRR  Respiratory: Bilateral expiratory wheezing  Gastrointestinal: Soft, nontender, nondistended, + bowel sounds  Ext: no pedal edema bilaterally  Neuro: AAOx3, Cr N's II- XII. Strength 5/5 upper and lower extremities bilaterally  Musculoskeletal: No digital cyanosis, clubbing  Skin: No rashes  Psych: Normal affect and demeanor, alert and oriented x3    Data Reviewed:  I have personally reviewed following labs and imaging studies  Micro Results Recent Results (from the past 240 hour(s))  SARS Coronavirus 2 by RT PCR (hospital order, performed in [facility] hospital lab) Nasopharyngeal Nasopharyngeal Swab     Status: None   Collection Time: 08/25/19 10:41 AM   Specimen: Nasopharyngeal Swab  Result Value Ref Range Status   SARS Coronavirus 2 NEGATIVE NEGATIVE Final    Comment: (NOTE) SARS-CoV-2 target nucleic acids are NOT DETECTED.  The SARS-CoV-2 RNA is generally detectable in upper and lower respiratory specimens during the acute phase of infection. The lowest concentration of SARS-CoV-2 viral copies this assay can detect is 250 copies / mL. A negative result does not preclude SARS-CoV-2 infection and should not be used as the sole basis for treatment or other patient management decisions.  A negative result may occur with improper specimen collection / handling, submission of specimen other than nasopharyngeal swab, presence of viral mutation(s) within the areas targeted by this assay, and inadequate number of viral copies (<250 copies / mL). A negative result must be combined with clinical observations, patient history, and epidemiological information.  Fact Sheet for Patients:    StrictlyIdeas.no  Fact Sheet for Healthcare Providers: BankingDealers.co.za  This test is not yet approved or  cleared by the [place] FDA and has been authorized for detection and/or diagnosis of SARS-CoV-2 by FDA under an Emergency Use Authorization (EUA).  This EUA will remain in effect (meaning this test can be used) for the duration of the COVID-19 declaration under Section 564(b)(1) of the Act, 21 U.S.C. section 360bbb-3(b)(1), unless the authorization is terminated or revoked sooner.  Performed at [facility], [address] [place], [place] [address]   Blood culture (routine x 2)     Status: None (Preliminary result)   Collection Time: 08/25/19 12:54 PM   Specimen: BLOOD  Result Value Ref Range Status   Specimen Description BLOOD RIGHT ANTECUBITAL  Final   Special Requests   Final    BOTTLES DRAWN AEROBIC AND ANAEROBIC Blood Culture adequate volume   Culture   Final    NO GROWTH < 24 HOURS Performed at [facility], [address], [place], [place] [address]    Report Status PENDING  Incomplete  Blood culture (routine x 2)     Status: None (Preliminary result)   Collection Time: 08/25/19  1:05 PM   Specimen: BLOOD  Result Value Ref Range Status   Specimen Description BLOOD BLOOD RIGHT FOREARM  Final   Special Requests   Final    BOTTLES DRAWN AEROBIC AND ANAEROBIC Blood Culture adequate volume   Culture   Final    NO GROWTH < 24 HOURS Performed at [facility], [address], [place], [place] [address]    Report Status PENDING  Incomplete    Radiology Reports DG Chest Portable 1 View  Result Date: 08/25/2019 CLINICAL DATA:  Worsening shortness of breath and cough for 3 days. History of asthma. EXAM: PORTABLE CHEST 1 VIEW COMPARISON:  October 21, 2018 FINDINGS: The right hemidiaphragm is elevated. The heart, hila, and mediastinum are unchanged. Increased interstitial markings are  seen in the lungs. Mildly more focal  opacity in left base. Mild atelectasis in the right base. No nodules or masses. No other acute abnormalities. IMPRESSION: 1. Diffuse increased interstitial opacities may represent pulmonary edema versus atypical infection. More focal opacity in left base could represent atelectasis or developing infiltrate. Recommend clinical correlation and short-term follow-up imaging to ensure resolution of findings. Electronically Signed   By: Dorise Bullion III M.D   On: 08/25/2019 11:09    Lab Data:  CBC: Recent Labs  Lab 08/25/19 1041 08/26/19 0452  WBC 9.0 5.6  NEUTROABS 6.6  --   HGB 12.5 11.8*  HCT 37.2 35.9*  MCV 95.4 95.5  PLT 183 185   Basic Metabolic Panel: Recent Labs  Lab 08/25/19 1041 08/26/19 0452  NA 143 142  K 2.9* 3.7  CL 107 106  CO2 25 23  GLUCOSE 110* 182*  BUN 18 21  CREATININE 0.84 0.86  CALCIUM 8.0* 7.9*  MG  --  1.5*   GFR: Estimated Creatinine Clearance: 52 mL/min (by C-G formula based on SCr of 0.86 mg/dL). Liver Function Tests: Recent Labs  Lab 08/25/19 1041  AST 18  ALT 35  ALKPHOS 76  BILITOT 1.0  PROT 5.8*  ALBUMIN 3.5   No results for input(s): LIPASE, AMYLASE in the last 168 hours. No results for input(s): AMMONIA in the last 168 hours. Coagulation Profile: No results for input(s): INR, PROTIME in the last 168 hours. Cardiac Enzymes: No results for input(s): CKTOTAL, CKMB, CKMBINDEX, TROPONINI in the last 168 hours. BNP (last 3 results) No results for input(s): PROBNP in the last 8760 hours. HbA1C: No results for input(s): HGBA1C in the last 72 hours. CBG: No results for input(s): GLUCAP in the last 168 hours. Lipid Profile: Recent Labs    08/26/19 0452  CHOL 131  HDL 53  LDLCALC 62  TRIG 78  CHOLHDL 2.5   Thyroid Function Tests: No results for input(s): TSH, T4TOTAL, FREET4, T3FREE, THYROIDAB in the last 72 hours. Anemia Panel: No results for input(s): VITAMINB12, FOLATE, FERRITIN, TIBC,  IRON, RETICCTPCT in the last 72 hours. Urine analysis:    Component Value Date/Time   COLORURINE AMBER (A) 06/17/2019 1336   APPEARANCEUR HAZY (A) 06/17/2019 1336   APPEARANCEUR Clear 06/08/2014 2052   LABSPEC 1.020 06/17/2019 1336   LABSPEC 1.015 06/08/2014 2052   PHURINE 5.0 06/17/2019 1336   GLUCOSEU NEGATIVE 06/17/2019 1336   GLUCOSEU Negative 06/08/2014 2052   HGBUR NEGATIVE 06/17/2019 1336   BILIRUBINUR NEGATIVE 06/17/2019 1336   BILIRUBINUR Negative 06/08/2014 2052   KETONESUR NEGATIVE 06/17/2019 1336   PROTEINUR 100 (A) 06/17/2019 1336   NITRITE NEGATIVE 06/17/2019 1336   LEUKOCYTESUR SMALL (A) 06/17/2019 1336   LEUKOCYTESUR Negative 06/08/2014 2052     Noreta Kue M.D. [facility] 08/26/2019, 12:11 PM   Call night coverage person covering after 7pm

## 2019-08-27 DIAGNOSIS — I2510 Atherosclerotic heart disease of native coronary artery without angina pectoris: Secondary | ICD-10-CM

## 2019-08-27 LAB — BASIC METABOLIC PANEL
Anion gap: 11 (ref 5–15)
BUN: 30 mg/dL — ABNORMAL HIGH (ref 8–23)
CO2: 27 mmol/L (ref 22–32)
Calcium: 8.4 mg/dL — ABNORMAL LOW (ref 8.9–10.3)
Chloride: 102 mmol/L (ref 98–111)
Creatinine, Ser: 0.89 mg/dL (ref 0.44–1.00)
GFR calc Af Amer: >60 mL/min (ref >60)
GFR calc non Af Amer: 60 mL/min — ABNORMAL LOW (ref >60)
Glucose, Bld: 162 mg/dL — ABNORMAL HIGH (ref 70–99)
Potassium: 4.3 mmol/L (ref 3.5–5.1)
Sodium: 140 mmol/L (ref 135–145)

## 2019-08-27 MED ORDER — PREDNISONE 20 MG PO TABS: 40.0000 mg | ORAL_TABLET | Freq: Once | ORAL | Status: DC

## 2019-08-27 MED ORDER — PREDNISONE 20 MG PO TABS
ORAL_TABLET | ORAL | 0 refills | Status: DC
Start: 2019-08-27 — End: 2019-10-25

## 2019-08-27 MED ORDER — AZITHROMYCIN 250 MG PO TABS: 250.0000 mg | ORAL_TABLET | Freq: Every day | ORAL | 0 refills | Status: AC

## 2019-08-27 MED ORDER — FUROSEMIDE 40 MG PO TABS: 40.0000 mg | ORAL_TABLET | Freq: Every day | ORAL | Status: DC

## 2019-08-27 MED ORDER — LISINOPRIL 2.5 MG PO TABS: 2.5000 mg | ORAL_TABLET | Freq: Every day | ORAL | 3 refills | Status: DC

## 2019-08-27 MED ORDER — CLONAZEPAM 0.25 MG PO TBDP
0.2500 mg | ORAL_TABLET | Freq: Every day | ORAL | Status: DC
  Administered 2019-08-27: 0.25 mg via ORAL
  Filled 2019-08-27: qty 1

## 2019-08-27 MED ORDER — POTASSIUM CHLORIDE ER 10 MEQ PO TBCR: 10.0000 meq | EXTENDED_RELEASE_TABLET | Freq: Every day | ORAL | 3 refills | Status: DC

## 2019-08-27 MED ORDER — CLONAZEPAM 0.25 MG PO TBDP: 0.2500 mg | ORAL_TABLET | Freq: Every day | ORAL | 0 refills | Status: DC | PRN

## 2019-08-27 MED ORDER — FUROSEMIDE 40 MG PO TABS: 40.0000 mg | ORAL_TABLET | Freq: Every day | ORAL | 3 refills | Status: DC

## 2019-08-27 NOTE — Discharge Summary (Signed)
Physician Discharge Summary   Patient ID: Raven [name] MRN: [id number] DOB/AGE: November 06, 1934 85 y.o.  Admit date: 08/25/2019 Discharge date: 08/27/2019  Primary Care Physician:  Baxter Hire, MD   Recommendations for Outpatient Follow-up:  1. Follow up with PCP in 1-2 weeks 2. Please obtain BMP in one week 3. Lasix increased to 40 mg daily  Home Health: Home health PT OT Equipment/Devices:   Discharge Condition: stable  CODE STATUS: Partial Diet recommendation: Heart healthy diet  Discharge Diagnoses:    . COPD exacerbation ([place]) . Acute on chronic respiratory failure with hypoxia ([place]) . Acute on chronic diastolic CHF (congestive heart failure) ([place]) . HLD (hyperlipidemia) . Depression, anxiety  . CAD (coronary artery disease) . Elevated troponin . Hypokalemia . HTN (hypertension) . Crohn's disease ([place]) Hypomagnesemia   Consults: Cardiology    Allergies:   Allergies  Allergen Reactions  . Penicillins Other (See Comments)    Reaction:  Unknown  Has patient had a PCN reaction causing immediate rash, facial/tongue/throat swelling, SOB or lightheadedness with hypotension: unknown Has patient had a PCN reaction causing severe rash involving mucus membranes or skin necrosis: unknown Has patient had a PCN reaction that required hospitalization No Has patient had a PCN reaction occurring within the last 10 years: No If all of the above answers are "NO", then may proceed with Cephalosporin use.   . Augmentin [Amoxicillin-Pot Clavulanate] Other (See Comments)    Reaction:  Unknown   . Indocin [Indomethacin] Other (See Comments)    Reaction:  Unknown   . Lodine [Etodolac] Other (See Comments)    Reaction:   GI upset   . Methotrexate Derivatives Other (See Comments)    Reaction:  GI upset   . Gold Rash  . Zantac [Ranitidine Hcl] Rash     DISCHARGE MEDICATIONS: Allergies as of 08/27/2019      Reactions   Penicillins Other (See Comments)   Reaction:   Unknown  Has patient had a PCN reaction causing immediate rash, facial/tongue/throat swelling, SOB or lightheadedness with hypotension: unknown Has patient had a PCN reaction causing severe rash involving mucus membranes or skin necrosis: unknown Has patient had a PCN reaction that required hospitalization No Has patient had a PCN reaction occurring within the last 10 years: No If all of the above answers are "NO", then may proceed with Cephalosporin use.   Augmentin [amoxicillin-pot Clavulanate] Other (See Comments)   Reaction:  Unknown    Indocin [indomethacin] Other (See Comments)   Reaction:  Unknown    Lodine [etodolac] Other (See Comments)   Reaction:   GI upset    Methotrexate Derivatives Other (See Comments)   Reaction:  GI upset    Gold Rash   Zantac [ranitidine Hcl] Rash      Medication List    STOP taking these medications   amLODipine-benazepril 5-20 MG capsule Commonly known as: LOTREL   doxycycline 100 MG capsule Commonly known as: VIBRAMYCIN   hydrochlorothiazide 25 MG tablet Commonly known as: HYDRODIURIL   methylPREDNISolone 4 MG Tbpk tablet Commonly known as: MEDROL DOSEPAK     TAKE these medications   acetaminophen 500 MG tablet Commonly known as: TYLENOL Take 500 mg by mouth every 6 (six) hours as needed.   albuterol 108 (90 Base) MCG/ACT inhaler Commonly known as: VENTOLIN HFA Inhale 2 puffs into the lungs every 6 (six) hours as needed for wheezing or shortness of breath.   amitriptyline 10 MG tablet Commonly known as: ELAVIL Take 20 mg by mouth  at bedtime.   Anoro Ellipta 62.5-25 MCG/INH Aepb Generic drug: umeclidinium-vilanterol Inhale 1 puff into the lungs daily.   aspirin EC 81 MG tablet Take 81 mg by mouth daily.   atorvastatin 80 MG tablet Commonly known as: LIPITOR Take 80 mg by mouth at bedtime.   azithromycin 250 MG tablet Commonly known as: ZITHROMAX Take 1 tablet (250 mg total) by mouth daily for 3 days.   CITRACAL +D3  PO Take 1 tablet by mouth daily.   clonazePAM 0.25 MG disintegrating tablet Commonly known as: KLONOPIN Take 1 tablet (0.25 mg total) by mouth daily as needed (anxiety).   esomeprazole 40 MG capsule Commonly known as: NEXIUM Take 40 mg by mouth daily.   furosemide 40 MG tablet Commonly known as: LASIX Take 1 tablet (40 mg total) by mouth daily. What changed:   medication strength  how much to take   Humira 40 MG/0.8ML Pskt Generic drug: Adalimumab Inject 40 mg into the skin every 14 (fourteen) days.   leflunomide 10 MG tablet Commonly known as: ARAVA Take 10 mg by mouth daily.   lisinopril 2.5 MG tablet Commonly known as: ZESTRIL Take 1 tablet (2.5 mg total) by mouth daily. Start taking on: August 28, 2019   mesalamine 500 MG CR capsule Commonly known as: PENTASA Take 1,000 mg by mouth 2 (two) times daily.   metoprolol succinate 50 MG 24 hr tablet Commonly known as: TOPROL-XL Take 50 mg by mouth daily.   multivitamin with minerals Tabs tablet Take 1 tablet by mouth daily.   nitroGLYCERIN 0.4 MG SL tablet Commonly known as: NITROSTAT Place 0.4 mg under the tongue every 5 (five) minutes as needed for chest pain.   potassium chloride 10 MEQ tablet Commonly known as: KLOR-CON Take 1 tablet (10 mEq total) by mouth daily.   predniSONE 1 MG tablet Commonly known as: DELTASONE Take by mouth. Take 2 tablets at bedtime What changed: Another medication with the same name was added. Make sure you understand how and when to take each.   predniSONE 5 MG tablet Commonly known as: DELTASONE Take 5 mg by mouth daily with breakfast. What changed: Another medication with the same name was added. Make sure you understand how and when to take each.   predniSONE 20 MG tablet Commonly known as: DELTASONE Please continue prednisone 40 mg daily for 5 days.  After the completion, continue with your daily maintenance dose of prednisone. What changed: You were already taking a  medication with the same name, and this prescription was added. Make sure you understand how and when to take each.   risedronate 150 MG tablet Commonly known as: ACTONEL Take 150 mg by mouth every 30 (thirty) days. with water on empty stomach, nothing by mouth or lie down for next 30 minutes.   senna 8.6 MG tablet Commonly known as: SENOKOT Take 2 tablets by mouth at bedtime.   thiamine 100 MG tablet Commonly known as: Vitamin B-1 Take 100 mg by mouth daily.   Vitamin B12-Folic Acid 762-263 MCG Tabs Take by mouth daily.   vitamin E 180 MG (400 UNITS) capsule Take 400 Units by mouth daily.        Brief H and P: For complete details please refer to admission H and P, but in brief **Patient is 85 year old female with history of hypertension, hyperlipidemia, COPD on 3 L oxygen, asthma, PAD, depression, CAD, myocardial infarction, Crohn's disease, CHF, anemia, presented with shortness of breath.  Patient reported shortness of breath for  the last 3 days, progressively worsening.  No chest pain, fevers or chills but has coughing.  Patient was seen by her pulmonologist on Monday.    ED Course:pt was found to have BNP 635, troponin 75, lactic acid 1.6, WBC 9.0, pending COVID-19 PCR, potassium 2.9, renal function okay, temperature normal, blood pressure 142/62, heart rate 91, RR 15, oxygen saturation 91-94% on home level 3 L oxygen.Chest x-ray showed interstitial pulmonary edema. Patient is admitted to progressive bed as inpatient.[facility] Course:  Acute on chronic respiratory failure with hypoxia (HCC) -Likely due to the combination of acute on chronic diastolic CHF and COPD exacerbation.  Patient presented with elevated BNP 635, positive troponin, peripheral edema, chest x-ray with interstitial pulmonary edema consistent with CHF exacerbation.  Also noted to have diffuse bilateral wheezing -Patient was placed on IV Lasix 40 mg every 12 hours, strict I's and O's and daily weights.   Negative balance of 2 L of the time of discharge. -Echo showed EF of 40 to 46%, diastolic parameters indeterminate -Patient was followed by cardiology, recommended discharge home on Lasix 40 mg daily, outpatient follow-up with Dr Saralyn Pilar in a week.    Acute COPD exacerbation -Much improved, expiratory wheezing has improved -Patient was placed on scheduled nebs, Pulmicort, Brovana, IV Solu-Medrol -Continue Zithromax for 3 more days. Patient was placed on oral prednisone 40 mg daily for 5 days.  She was recommended to hold off on the maintenance dose of prednisone (for Crohn's disease) until she has completed the higher dose of prednisone  Elevated troponin, history of CAD - likely due to demand ischemia secondary to CHF exacerbation. -EKG with LBBB, does not appear to be new.  2D echo showed EF of 40 to 45%, indeterminate diastolic parameters Cleared by cardiology to discharge home, follow-up outpatient in a week   Hyperlipidemia Continue Lipitor   Essential hypertension BP currently stable, discontinued Lotrel, patient is now on higher dose of Lasix Continue metoprolol, lisinopril. DC lotrel   Depression -Stable, no SI or HI, continue amitriptyline She has been feeling very anxious, requested anxiolytic.  For now placed on small dose of Klonopin as needed, recommended to follow-up with her PCP.  Crohn's disease Continue mesalamine, leflunomide  Hypokalemia Resolved, 4.3  Hypomagnesemia Magnesium was replaced  Day of Discharge S: No acute issues, no wheezing, shortness of breath.  Feels a whole lot better, hoping to go home today  BP 129/64 (BP Location: Left Arm)   Pulse 74   Temp 97.6 F (36.4 C) (Oral)   Resp 18   Ht 5' 6"  (1.676 m)   Wt 77.2 kg   SpO2 98%   BMI 27.45 kg/m   Physical Exam: General: Alert and awake oriented x3 not in any acute distress. HEENT: anicteric sclera, pupils reactive to light and accommodation CVS: S1-S2 clear no murmur  rubs or gallops Chest: clear to auscultation bilaterally, no wheezing rales or rhonchi Abdomen: soft nontender, nondistended, normal bowel sounds Extremities: no cyanosis, clubbing or edema noted bilaterally Neuro: Cranial nerves II-XII intact, no focal neurological deficits    Get Medicines reviewed and adjusted: Please take all your medications with you for your next visit with your Primary MD  Please request your Primary MD to go over all hospital tests and procedure/radiological results at the follow up. Please ask your Primary MD to get all Hospital records sent to his/her office.  If you experience worsening of your admission symptoms, develop shortness of breath, life threatening emergency, suicidal or homicidal thoughts  you must seek medical attention immediately by calling 911 or calling your MD immediately  if symptoms less severe.  You must read complete instructions/literature along with all the possible adverse reactions/side effects for all the Medicines you take and that have been prescribed to you. Take any new Medicines after you have completely understood and accept all the possible adverse reactions/side effects.   Do not drive when taking pain medications.   Do not take more than prescribed Pain, Sleep and Anxiety Medications  Special Instructions: If you have smoked or chewed Tobacco  in the last 2 yrs please stop smoking, stop any regular Alcohol  and or any Recreational drug use.  Wear Seat belts while driving.  Please note  You were cared for by a hospitalist during your hospital stay. Once you are discharged, your primary care physician will handle any further medical issues. Please note that NO REFILLS for any discharge medications will be authorized once you are discharged, as it is imperative that you return to your primary care physician (or establish a relationship with a primary care physician if you do not have one) for your aftercare needs so that they can  reassess your need for medications and monitor your lab values.   The results of significant diagnostics from this hospitalization (including imaging, microbiology, ancillary and laboratory) are listed below for reference.      Procedures/Studies:  DG Chest Portable 1 View  Result Date: 08/25/2019 CLINICAL DATA:  Worsening shortness of breath and cough for 3 days. History of asthma. EXAM: PORTABLE CHEST 1 VIEW COMPARISON:  October 21, 2018 FINDINGS: The right hemidiaphragm is elevated. The heart, hila, and mediastinum are unchanged. Increased interstitial markings are seen in the lungs. Mildly more focal opacity in left base. Mild atelectasis in the right base. No nodules or masses. No other acute abnormalities. IMPRESSION: 1. Diffuse increased interstitial opacities may represent pulmonary edema versus atypical infection. More focal opacity in left base could represent atelectasis or developing infiltrate. Recommend clinical correlation and short-term follow-up imaging to ensure resolution of findings. Electronically Signed   By: Dorise Bullion III M.D   On: 08/25/2019 11:09   ECHOCARDIOGRAM COMPLETE  Result Date: 08/26/2019    ECHOCARDIOGRAM REPORT   Patient Name:   Raven [name] Date of Exam: 08/26/2019 Medical Rec #:  [id number]       Height:       66.0 in Accession #:    [id number]      Weight:       170.0 lb Date of Birth:  Jan 25, 1935       BSA:          1.866 m Patient Age:    20 years        BP:           126/80 mmHg Patient Gender: F               HR:           78 bpm. Exam Location:  [facility] Procedure: 2D Echo Indications:     Acute Diastolic CHF  History:         Patient has prior history of Echocardiogram examinations. CHF,                  Previous Myocardial Infarction, COPD; Risk Factors:Hypertension                  and Dyslipidemia.  Sonographer:     L Thornton-Maynard Referring Phys:  [place] Diagnosing Phys: Isaias Cowman MD IMPRESSIONS  1. Left ventricular ejection  fraction, by estimation, is 40 to 45%. The left ventricle has mildly decreased function. The left ventricle has no regional wall motion abnormalities. Left ventricular diastolic parameters are indeterminate.  2. Right ventricular systolic function is normal. The right ventricular size is normal. There is mildly elevated pulmonary artery systolic pressure.  3. The mitral valve is normal in structure. Moderate mitral valve regurgitation. No evidence of mitral stenosis.  4. The aortic valve is normal in structure. Aortic valve regurgitation is not visualized. No aortic stenosis is present.  5. The inferior vena cava is normal in size with greater than 50% respiratory variability, suggesting right atrial pressure of 3 mmHg. FINDINGS  Left Ventricle: Left ventricular ejection fraction, by estimation, is 40 to 45%. The left ventricle has mildly decreased function. The left ventricle has no regional wall motion abnormalities. The left ventricular internal cavity size was normal in size. There is no left ventricular hypertrophy. Left ventricular diastolic parameters are indeterminate. Right Ventricle: The right ventricular size is normal. No increase in right ventricular wall thickness. Right ventricular systolic function is normal. There is mildly elevated pulmonary artery systolic pressure. The tricuspid regurgitant velocity is 2.93  m/s, and with an assumed right atrial pressure of 10 mmHg, the estimated right ventricular systolic pressure is 16.1 mmHg. Left Atrium: Left atrial size was normal in size. Right Atrium: Right atrial size was normal in size. Pericardium: There is no evidence of pericardial effusion. Mitral Valve: The mitral valve is normal in structure. Normal mobility of the mitral valve leaflets. Moderate mitral valve regurgitation. No evidence of mitral valve stenosis. MV peak gradient, 6.2 mmHg. The mean mitral valve gradient is 4.0 mmHg. Tricuspid Valve: The tricuspid valve is normal in structure.  Tricuspid valve regurgitation is mild . No evidence of tricuspid stenosis. Aortic Valve: The aortic valve is normal in structure. Aortic valve regurgitation is not visualized. Aortic regurgitation PHT measures 287 msec. No aortic stenosis is present. Aortic valve mean gradient measures 3.0 mmHg. Aortic valve peak gradient measures 6.1 mmHg. Aortic valve area, by VTI measures 1.83 cm. Pulmonic Valve: The pulmonic valve was normal in structure. Pulmonic valve regurgitation is not visualized. No evidence of pulmonic stenosis. Aorta: The aortic root is normal in size and structure. Venous: The inferior vena cava is normal in size with greater than 50% respiratory variability, suggesting right atrial pressure of 3 mmHg. IAS/Shunts: No atrial level shunt detected by color flow Doppler.  LEFT VENTRICLE PLAX 2D LVIDd:         4.74 cm  Diastology LVIDs:         4.04 cm  LV e' lateral:   6.22 cm/s LV PW:         1.36 cm  LV E/e' lateral: 18.6 LV IVS:        1.53 cm  LV e' medial:    3.86 cm/s LVOT diam:     2.00 cm  LV E/e' medial:  30.1 LV SV:         44 LV SV Index:   23 LVOT Area:     3.14 cm  RIGHT VENTRICLE RV S prime:     9.64 cm/s TAPSE (M-mode): 1.9 cm LEFT ATRIUM             Index LA diam:        4.00 cm 2.14 cm/m LA Vol (A2C):   46.2 ml 24.75 ml/m LA Vol (A4C):  78.7 ml 42.17 ml/m LA Biplane Vol: 65.5 ml 35.10 ml/m  AORTIC VALVE AV Area (Vmax):    1.55 cm AV Area (Vmean):   1.65 cm AV Area (VTI):     1.83 cm AV Vmax:           123.00 cm/s AV Vmean:          84.700 cm/s AV VTI:            0.238 m AV Peak Grad:      6.1 mmHg AV Mean Grad:      3.0 mmHg LVOT Vmax:         60.70 cm/s LVOT Vmean:        44.500 cm/s LVOT VTI:          0.139 m LVOT/AV VTI ratio: 0.58 AI PHT:            287 msec  AORTA Ao Root diam: 2.90 cm MITRAL VALVE                TRICUSPID VALVE MV Area (PHT): 4.00 cm     TR Peak grad:   34.3 mmHg MV Peak grad:  6.2 mmHg     TR Vmax:        293.00 cm/s MV Mean grad:  4.0 mmHg MV Vmax:        1.25 m/s     SHUNTS MV Vmean:      93.7 cm/s    Systemic VTI:  0.14 m MV E velocity: 116.00 cm/s  Systemic Diam: 2.00 cm MV A velocity: 103.00 cm/s MV E/A ratio:  1.13 Isaias Cowman MD Electronically signed by Isaias Cowman MD Signature Date/Time: 08/26/2019/1:00:21 PM    Final       LAB RESULTS: Basic Metabolic Panel: Recent Labs  Lab 08/26/19 0452 08/27/19 0632  NA 142 140  K 3.7 4.3  CL 106 102  CO2 23 27  GLUCOSE 182* 162*  BUN 21 30*  CREATININE 0.86 0.89  CALCIUM 7.9* 8.4*  MG 1.5*  --    Liver Function Tests: Recent Labs  Lab 08/25/19 1041  AST 18  ALT 35  ALKPHOS 76  BILITOT 1.0  PROT 5.8*  ALBUMIN 3.5   No results for input(s): LIPASE, AMYLASE in the last 168 hours. No results for input(s): AMMONIA in the last 168 hours. CBC: Recent Labs  Lab 08/25/19 1041 08/25/19 1041 08/26/19 0452  WBC 9.0  --  5.6  NEUTROABS 6.6  --   --   HGB 12.5  --  11.8*  HCT 37.2  --  35.9*  MCV 95.4   < > 95.5  PLT 183  --  189   < > = values in this interval not displayed.   Cardiac Enzymes: No results for input(s): CKTOTAL, CKMB, CKMBINDEX, TROPONINI in the last 168 hours. BNP: Invalid input(s): POCBNP CBG: No results for input(s): GLUCAP in the last 168 hours.     Disposition and Follow-up: Discharge Instructions    (HEART FAILURE PATIENTS) Call MD:  Anytime you have any of the following symptoms: 1) 3 pound weight gain in 24 hours or 5 pounds in 1 week 2) shortness of breath, with or without a dry hacking cough 3) swelling in the hands, feet or stomach 4) if you have to sleep on extra pillows at night in order to breathe.   Complete by: As directed    Diet - low sodium heart healthy   Complete by: As directed  Discharge instructions   Complete by: As directed    Please HOLD your maintenance dose prednisone (5 mg a.m., 1 mg p.m.) until you have completed prednisone 40 mg daily for 5 days.   Increase activity slowly   Complete by: As directed         DISPOSITION: Home with home health   DISCHARGE FOLLOW-UP  Follow-up Information    Paraschos, Alexander, MD. Schedule an appointment as soon as possible for a visit in 1 week(s).   Specialty: Cardiology Contact information: [facility] [place] [address] [phone]        Baxter Hire, MD Follow up in 2 week(s).   Specialty: Internal Medicine Contact information: [place] [address] [phone]                Time coordinating discharge:  35 minutes  Signed:   Estill Cotta M.D. [facility] 08/27/2019, 11:49 AM

## 2019-08-27 NOTE — Progress Notes (Signed)
Patient discharged to home. Tele and IV d/c'd. Patient verbalizes understanding of discharge instructions. 

## 2019-08-27 NOTE — Plan of Care (Signed)
  Problem: Clinical Measurements: Goal: Ability to maintain clinical measurements within normal limits will improve Outcome: Progressing   

## 2019-08-27 NOTE — TOC Progression Note (Signed)
Transition of Care [facility]) - Progression Note    Patient Details  Name: Raven [name] MRN: [id number] Date of Birth: [birth year]  Transition of Care [facility]) CM/SW [place], LCSW Phone Number: 08/27/2019, 1:59 PM  Clinical Narrative: Advanced will service pt. Referral has been made. No additional needs at this time.      Expected Discharge Plan: [place] Barriers to Discharge: Continued Medical Work up  Expected Discharge Plan and Services Expected Discharge Plan: [place] arrangements for the past 2 months: Single Family Home Expected Discharge Date: 08/27/19                                     Social Determinants of Health (SDOH) Interventions    Readmission Risk Interventions No flowsheet data found.

## 2019-08-27 NOTE — Progress Notes (Signed)
Initial Nutrition Assessment  RD working remotely.  DOCUMENTATION CODES:   Not applicable  INTERVENTION:  Encouraged adequate intake of calories and protein at meals. Patient reports she has a good appetite now and is eating well so she does not need an oral nutrition supplement at this time.  NUTRITION DIAGNOSIS:   Increased nutrient needs related to catabolic illness (COPD, CHF) as evidenced by estimated needs.  GOAL:   Patient will meet greater than or equal to 90% of their needs  MONITOR:   PO intake, Supplement acceptance, Labs, Weight trends, I & O's  REASON FOR ASSESSMENT:   Malnutrition Screening Tool    ASSESSMENT:   85 year old female with PMHx of arthritis, asthma, COPD, Crohn's disease, HTN, HLD, hx MI, PUD, osteopenia, admitted with acute on chronic diastolic CHF and acute exacerbation of COPD.   Spoke with patient over the phone. She reports her appetite was decreased for several days PTA due to not feeling well. She reports she typically has a good appetite at baseline and eats 2 meals per day plus an occasional evening snack. She reports choosing adequate source of protein at her meals. She follows low-sodium diet and denies any educational needs at this time. She reports she weighs herself daily. Patient reports her appetite has returned to normal and she is eating well here. She is eating 80-100% of her meals. She reports she does not need any oral nutrition supplements at this time.  Patient reports her UBW is 170 lbs. She is currently 77.2 kg (170.1 lbs).  Medications reviewed and include: azithromycin, [place] with D, vitamin B12 500 micrograms daily, folic acid 0.5 mg daily, Lasix 40 mg Q12hrs IV, lisinopril, Solu-Medrol 40 mg Q12hrs IV, MVI daily, Protonix, thiamine 100 mg daily.  Labs reviewed: BUN 30.  Unable to determine if patient meets criteria for malnutrition at this time.  NUTRITION - FOCUSED PHYSICAL EXAM:  Unable to complete at this time as RD  is working remotely.  Diet Order:   Diet Order            Diet 2 gram sodium Room service appropriate? Yes; Fluid consistency: Thin  Diet effective now                EDUCATION NEEDS:   No education needs have been identified at this time  Skin:  Skin Assessment: Reviewed RN Assessment  Last BM:  08/26/2019  Height:   Ht Readings from Last 1 Encounters:  08/25/19 5' 6"  (1.676 m)   Weight:   Wt Readings from Last 1 Encounters:  08/27/19 77.2 kg   BMI:  Body mass index is 27.45 kg/m.  Estimated Nutritional Needs:   Kcal:  1800-2000  Protein:  90-100 grams  Fluid:  per MD  Jacklynn Barnacle, MS, RD, LDN Pager number available on Amion

## 2019-08-27 NOTE — Progress Notes (Signed)
[facility] Cardiology  SUBJECTIVE: Patient sitting up in bed, reports feeling better, denies chest pain, proved shortness of breath   Vitals:   08/27/19 0402 08/27/19 0820 08/27/19 0855 08/27/19 1133  BP: 121/63 (!) 143/72  129/64  Pulse: 76 78  74  Resp:  18  18  Temp: 97.8 F (36.6 C) 98 F (36.7 C)  97.6 F (36.4 C)  TempSrc: Oral   Oral  SpO2: 98% 99% 93% 98%  Weight: 77.2 kg     Height:         Intake/Output Summary (Last 24 hours) at 08/27/2019 1133 Last data filed at 08/27/2019 0950 Gross per 24 hour  Intake 960 ml  Output 1400 ml  Net -440 ml      PHYSICAL EXAM  General: Well developed, well nourished, in no acute distress HEENT:  Normocephalic and atramatic Neck:  No JVD.  Lungs: Clear bilaterally to auscultation and percussion. Heart: HRRR . Normal S1 and S2 without gallops or murmurs.  Abdomen: Bowel sounds are positive, abdomen soft and non-tender  Msk:  Back normal, normal gait. Normal strength and tone for age. Extremities: No clubbing, cyanosis or edema.   Neuro: Alert and oriented X 3. Psych:  Good affect, responds appropriately   LABS: Basic Metabolic Panel: Recent Labs    08/26/19 0452 08/27/19 0632  NA 142 140  K 3.7 4.3  CL 106 102  CO2 23 27  GLUCOSE 182* 162*  BUN 21 30*  CREATININE 0.86 0.89  CALCIUM 7.9* 8.4*  MG 1.5*  --    Liver Function Tests: Recent Labs    08/25/19 1041  AST 18  ALT 35  ALKPHOS 76  BILITOT 1.0  PROT 5.8*  ALBUMIN 3.5   No results for input(s): LIPASE, AMYLASE in the last 72 hours. CBC: Recent Labs    08/25/19 1041 08/26/19 0452  WBC 9.0 5.6  NEUTROABS 6.6  --   HGB 12.5 11.8*  HCT 37.2 35.9*  MCV 95.4 95.5  PLT 183 189   Cardiac Enzymes: No results for input(s): CKTOTAL, CKMB, CKMBINDEX, TROPONINI in the last 72 hours. BNP: Invalid input(s): POCBNP D-Dimer: No results for input(s): DDIMER in the last 72 hours. Hemoglobin A1C: Recent Labs    08/26/19 0452  HGBA1C 6.7*   Fasting Lipid  Panel: Recent Labs    08/26/19 0452  CHOL 131  HDL 53  LDLCALC 62  TRIG 78  CHOLHDL 2.5   Thyroid Function Tests: No results for input(s): TSH, T4TOTAL, T3FREE, THYROIDAB in the last 72 hours.  Invalid input(s): FREET3 Anemia Panel: No results for input(s): VITAMINB12, FOLATE, FERRITIN, TIBC, IRON, RETICCTPCT in the last 72 hours.  ECHOCARDIOGRAM COMPLETE  Result Date: 08/26/2019    ECHOCARDIOGRAM REPORT   Patient Name:   Raven [name] Date of Exam: 08/26/2019 Medical Rec #:  [id number]       Height:       66.0 in Accession #:    [id number]      Weight:       170.0 lb Date of Birth:  June 23, 1934       BSA:          1.866 m Patient Age:    85 years        BP:           126/80 mmHg Patient Gender: F               HR:           78  bpm. Exam Location:  [facility] Procedure: 2D Echo Indications:     Acute Diastolic CHF  History:         Patient has prior history of Echocardiogram examinations. CHF,                  Previous Myocardial Infarction, COPD; Risk Factors:Hypertension                  and Dyslipidemia.  Sonographer:     L Thornton-Maynard Referring Phys:  [id number] NIU Diagnosing Phys: Isaias Cowman MD IMPRESSIONS  1. Left ventricular ejection fraction, by estimation, is 40 to 45%. The left ventricle has mildly decreased function. The left ventricle has no regional wall motion abnormalities. Left ventricular diastolic parameters are indeterminate.  2. Right ventricular systolic function is normal. The right ventricular size is normal. There is mildly elevated pulmonary artery systolic pressure.  3. The mitral valve is normal in structure. Moderate mitral valve regurgitation. No evidence of mitral stenosis.  4. The aortic valve is normal in structure. Aortic valve regurgitation is not visualized. No aortic stenosis is present.  5. The inferior vena cava is normal in size with greater than 50% respiratory variability, suggesting right atrial pressure of 3 mmHg. FINDINGS  Left Ventricle: Left  ventricular ejection fraction, by estimation, is 40 to 45%. The left ventricle has mildly decreased function. The left ventricle has no regional wall motion abnormalities. The left ventricular internal cavity size was normal in size. There is no left ventricular hypertrophy. Left ventricular diastolic parameters are indeterminate. Right Ventricle: The right ventricular size is normal. No increase in right ventricular wall thickness. Right ventricular systolic function is normal. There is mildly elevated pulmonary artery systolic pressure. The tricuspid regurgitant velocity is 2.93  m/s, and with an assumed right atrial pressure of 10 mmHg, the estimated right ventricular systolic pressure is 39.7 mmHg. Left Atrium: Left atrial size was normal in size. Right Atrium: Right atrial size was normal in size. Pericardium: There is no evidence of pericardial effusion. Mitral Valve: The mitral valve is normal in structure. Normal mobility of the mitral valve leaflets. Moderate mitral valve regurgitation. No evidence of mitral valve stenosis. MV peak gradient, 6.2 mmHg. The mean mitral valve gradient is 4.0 mmHg. Tricuspid Valve: The tricuspid valve is normal in structure. Tricuspid valve regurgitation is mild . No evidence of tricuspid stenosis. Aortic Valve: The aortic valve is normal in structure. Aortic valve regurgitation is not visualized. Aortic regurgitation PHT measures 287 msec. No aortic stenosis is present. Aortic valve mean gradient measures 3.0 mmHg. Aortic valve peak gradient measures 6.1 mmHg. Aortic valve area, by VTI measures 1.83 cm. Pulmonic Valve: The pulmonic valve was normal in structure. Pulmonic valve regurgitation is not visualized. No evidence of pulmonic stenosis. Aorta: The aortic root is normal in size and structure. Venous: The inferior vena cava is normal in size with greater than 50% respiratory variability, suggesting right atrial pressure of 3 mmHg. IAS/Shunts: No atrial level shunt  detected by color flow Doppler.  LEFT VENTRICLE PLAX 2D LVIDd:         4.74 cm  Diastology LVIDs:         4.04 cm  LV e' lateral:   6.22 cm/s LV PW:         1.36 cm  LV E/e' lateral: 18.6 LV IVS:        1.53 cm  LV e' medial:    3.86 cm/s LVOT diam:     2.00 cm  LV E/e' medial:  30.1 LV SV:         44 LV SV Index:   23 LVOT Area:     3.14 cm  RIGHT VENTRICLE RV S prime:     9.64 cm/s TAPSE (M-mode): 1.9 cm LEFT ATRIUM             Index LA diam:        4.00 cm 2.14 cm/m LA Vol (A2C):   46.2 ml 24.75 ml/m LA Vol (A4C):   78.7 ml 42.17 ml/m LA Biplane Vol: 65.5 ml 35.10 ml/m  AORTIC VALVE AV Area (Vmax):    1.55 cm AV Area (Vmean):   1.65 cm AV Area (VTI):     1.83 cm AV Vmax:           123.00 cm/s AV Vmean:          84.700 cm/s AV VTI:            0.238 m AV Peak Grad:      6.1 mmHg AV Mean Grad:      3.0 mmHg LVOT Vmax:         60.70 cm/s LVOT Vmean:        44.500 cm/s LVOT VTI:          0.139 m LVOT/AV VTI ratio: 0.58 AI PHT:            287 msec  AORTA Ao Root diam: 2.90 cm MITRAL VALVE                TRICUSPID VALVE MV Area (PHT): 4.00 cm     TR Peak grad:   34.3 mmHg MV Peak grad:  6.2 mmHg     TR Vmax:        293.00 cm/s MV Mean grad:  4.0 mmHg MV Vmax:       1.25 m/s     SHUNTS MV Vmean:      93.7 cm/s    Systemic VTI:  0.14 m MV E velocity: 116.00 cm/s  Systemic Diam: 2.00 cm MV A velocity: 103.00 cm/s MV E/A ratio:  1.13 Isaias Cowman MD Electronically signed by Isaias Cowman MD Signature Date/Time: 08/26/2019/1:00:21 PM    Final      Echo LV EF 40 to 45%  TELEMETRY: Normal sinus rhythm 100 bpm:  ASSESSMENT AND PLAN:  Principal Problem:   Acute on chronic respiratory failure with hypoxia (HCC) Active Problems:   COPD exacerbation (HCC)   Acute on chronic diastolic CHF (congestive heart failure) (HCC)   HLD (hyperlipidemia)   Depression   CAD (coronary artery disease)   Elevated troponin   Hypokalemia   HTN (hypertension)   Crohn's disease ([place])    1.  Respiratory  failure, likely multifactorial, primarily due to COPD exacerbation, with element of acute diastolic congestive heart failure, improved 2.  Acute diastolic congestive heart failure, with normal left ventricular function, improved with intravenous furosemide and diuresis 3.  COPD exacerbation, on home O2 4.  CAD, status post non-STEMI and DES OM1 08/10/2013, without chest pain, with nondiagnostic ECG with left bundle branch block, mildly elevated high-sensitivity troponin (75, 69, 46, 52), without significant delta  Recommendations  1.  Agree with current therapy 2.  Agree may discharge home 3.  Agree with uptitrate furosemide to 40 mg p.o. daily 4.  Follow-up in 1 week     Isaias Cowman, MD, PhD, [facility] 08/27/2019 11:33 AM

## 2019-08-30 LAB — CULTURE, BLOOD (ROUTINE X 2)
Culture: NO GROWTH
Culture: NO GROWTH
Special Requests: ADEQUATE
Special Requests: ADEQUATE

## 2019-09-16 ENCOUNTER — Emergency Department: Payer: Medicare Other

## 2019-09-16 ENCOUNTER — Emergency Department
Admission: EM | Admit: 2019-09-16 | Discharge: 2019-09-16 | Disposition: A | Discharge status: Home / Self Care | Payer: Medicare Other | Attending: Emergency Medicine | Admitting: Emergency Medicine

## 2019-09-16 ENCOUNTER — Other Ambulatory Visit: Payer: Self-pay

## 2019-09-16 ENCOUNTER — Encounter: Payer: Self-pay | Admitting: Emergency Medicine

## 2019-09-16 DIAGNOSIS — W19XXXA Unspecified fall, initial encounter: Secondary | ICD-10-CM

## 2019-09-16 DIAGNOSIS — Z87891 Personal history of nicotine dependence: Secondary | ICD-10-CM | POA: Insufficient documentation

## 2019-09-16 DIAGNOSIS — W010XXA Fall on same level from slipping, tripping and stumbling without subsequent striking against object, initial encounter: Secondary | ICD-10-CM | POA: Insufficient documentation

## 2019-09-16 DIAGNOSIS — S59912A Unspecified injury of left forearm, initial encounter: Secondary | ICD-10-CM | POA: Diagnosis present

## 2019-09-16 DIAGNOSIS — I2510 Atherosclerotic heart disease of native coronary artery without angina pectoris: Secondary | ICD-10-CM | POA: Diagnosis not present

## 2019-09-16 DIAGNOSIS — Z79899 Other long term (current) drug therapy: Secondary | ICD-10-CM | POA: Diagnosis not present

## 2019-09-16 DIAGNOSIS — S01111A Laceration without foreign body of right eyelid and periocular area, initial encounter: Secondary | ICD-10-CM | POA: Diagnosis not present

## 2019-09-16 DIAGNOSIS — Z7901 Long term (current) use of anticoagulants: Secondary | ICD-10-CM | POA: Insufficient documentation

## 2019-09-16 DIAGNOSIS — J449 Chronic obstructive pulmonary disease, unspecified: Secondary | ICD-10-CM | POA: Insufficient documentation

## 2019-09-16 DIAGNOSIS — Z7982 Long term (current) use of aspirin: Secondary | ICD-10-CM | POA: Diagnosis not present

## 2019-09-16 DIAGNOSIS — S52502A Unspecified fracture of the lower end of left radius, initial encounter for closed fracture: Secondary | ICD-10-CM | POA: Diagnosis not present

## 2019-09-16 DIAGNOSIS — Y93H2 Activity, gardening and landscaping: Secondary | ICD-10-CM | POA: Diagnosis not present

## 2019-09-16 DIAGNOSIS — I10 Essential (primary) hypertension: Secondary | ICD-10-CM | POA: Diagnosis not present

## 2019-09-16 DIAGNOSIS — Y999 Unspecified external cause status: Secondary | ICD-10-CM | POA: Diagnosis not present

## 2019-09-16 DIAGNOSIS — S0990XA Unspecified injury of head, initial encounter: Secondary | ICD-10-CM | POA: Diagnosis not present

## 2019-09-16 DIAGNOSIS — Y92007 Garden or yard of unspecified non-institutional (private) residence as the place of occurrence of the external cause: Secondary | ICD-10-CM | POA: Insufficient documentation

## 2019-09-16 NOTE — ED Triage Notes (Signed)
Pt to ER states she tripped over a hose this morning hitting her head on the concrete.  Pt denies LOC, states she does take blood thinners.  Pt with noted laceration lateral to right eye, bleeding controlled.  Pt also c/o left arm pain, no deformity noted, ROM intact.

## 2019-09-16 NOTE — ED Notes (Signed)
EDP at bedside  

## 2019-09-16 NOTE — ED Notes (Signed)
Pt verbalized understanding of discharge instructions. NAD at this time. 

## 2019-09-16 NOTE — Discharge Instructions (Signed)
Please follow-up with Dr. Sabra Heck in approximately 1 week for recheck/reevaluation.  Return to the emergency department for any worsening pain, or any other symptom personally concerning to yourself.

## 2019-09-16 NOTE — ED Provider Notes (Signed)
[facility] Emergency Department Provider Note  Time seen: 9:25 AM  I have reviewed the triage vital signs and the nursing notes.   HISTORY  Chief Complaint Fall   HPI Raven [name] is a 85 y.o. female with a past medical history of anemia, arthritis, COPD, hypertension, hyperlipidemia, presents to the emergency department after a fall.  According to the patient she was watering her flowers when she tripped over the hose causing her to fall forward hitting her head on the ground.  Patient has a small laceration just lateral of her right eyebrow.  Largely hemostatic.  Patient has pain to her right hand and left wrist as well.  Has been ambulatory.  Denies LOC.  No vomiting.   Past Medical History:  Diagnosis Date  . Anemia   . Arthritis   . Asthma   . Complication of anesthesia   . COPD (chronic obstructive pulmonary disease) ([place])   . Crohn's disease ([place])   . Depression    after death of husband  . Hyperlipidemia   . Hypertension   . Myocardial infarction ([place])   . Osteopenia   . Peptic ulcer disease   . Pneumonia   . PONV (postoperative nausea and vomiting)     Patient Active Problem List   Diagnosis Date Noted  . Acute on chronic diastolic CHF (congestive heart failure) ([place]) 08/25/2019  . HLD (hyperlipidemia) 08/25/2019  . HTN (hypertension) 08/25/2019  . Depression   . CAD (coronary artery disease)   . Elevated troponin   . Hypokalemia   . Crohn's disease ([place])   . Acute on chronic respiratory failure with hypoxia ([place])   . 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 SBO (small bowel obstruction) ([place]) 06/17/2019  . Crohn's disease of small intestine with intestinal obstruction ([place])   . COPD exacerbation ([place]) 10/20/2018  . COPD (chronic obstructive pulmonary disease) ([place]) 04/22/2016  . Acute bronchitis 04/17/2016  . Sepsis ([place]) 04/17/2016  . Peroneal tendonitis 07/18/2015  . Pneumonia 04/19/2015  . CAP (community acquired pneumonia)  11/02/2014    Past Surgical History:  Procedure Laterality Date  . ABDOMINAL SURGERY    . CARDIAC CATHETERIZATION     with stent  . COLONOSCOPY WITH PROPOFOL N/A 09/27/2014   Procedure: COLONOSCOPY WITH PROPOFOL;  Surgeon: [name], MD;  Location: [facility] ENDOSCOPY;  Service: Gastroenterology;  Laterality: N/A;  . COLONOSCOPY WITH PROPOFOL N/A 06/21/2019   Procedure: COLONOSCOPY WITH PROPOFOL;  Surgeon: Lin Landsman, MD;  Location: [facility] ENDOSCOPY;  Service: Gastroenterology;  Laterality: N/A;  . EYE SURGERY     bilateral cataract surgeries  . SHOULDER SURGERY Right    x 2   . SMALL INTESTINE SURGERY      Prior to Admission medications   Medication Sig Start Date End Date Taking? Authorizing Provider  acetaminophen (TYLENOL) 500 MG tablet Take 500 mg by mouth every 6 (six) hours as needed.    [provider]  Adalimumab (HUMIRA) 40 MG/0.8ML PSKT Inject 40 mg into the skin every 14 (fourteen) days.     [provider]  albuterol (PROVENTIL HFA;VENTOLIN HFA) 108 (90 BASE) MCG/ACT inhaler Inhale 2 puffs into the lungs every 6 (six) hours as needed for wheezing or shortness of breath.    [provider]  amitriptyline (ELAVIL) 10 MG tablet Take 20 mg by mouth at bedtime.    [provider]  ANORO ELLIPTA 62.5-25 MCG/INH AEPB Inhale 1 puff into the lungs daily. 05/29/19   [provider]  aspirin EC 81 MG tablet Take 81 mg by mouth daily.    [provider]  atorvastatin (LIPITOR) 80 MG tablet Take 80 mg by mouth at bedtime.     [provider]  Calcium-Phosphorus-Vitamin D (CITRACAL +D3 PO) Take 1 tablet by mouth daily.    [provider]  clonazePAM (KLONOPIN) 0.25 MG disintegrating tablet Take 1 tablet (0.25 mg total) by mouth daily as needed (anxiety). 08/27/19   Rai, Vernelle Emerald, MD  Cobalamin Combinations (VITAMIN B12-FOLIC ACID) 935-701 MCG TABS Take by mouth daily.    [provider]  esomeprazole  (NEXIUM) 40 MG capsule Take 40 mg by mouth daily. 05/29/19   [provider]  furosemide (LASIX) 40 MG tablet Take 1 tablet (40 mg total) by mouth daily. 08/27/19   Rai, Vernelle Emerald, MD  leflunomide (ARAVA) 10 MG tablet Take 10 mg by mouth daily. 03/29/19   [provider]  lisinopril (ZESTRIL) 2.5 MG tablet Take 1 tablet (2.5 mg total) by mouth daily. 08/28/19   Rai, Vernelle Emerald, MD  mesalamine (PENTASA) 500 MG CR capsule Take 1,000 mg by mouth 2 (two) times daily.    [provider]  metoprolol succinate (TOPROL-XL) 50 MG 24 hr tablet Take 50 mg by mouth daily.     [provider]  Multiple Vitamin (MULTIVITAMIN WITH MINERALS) TABS tablet Take 1 tablet by mouth daily.    [provider]  nitroGLYCERIN (NITROSTAT) 0.4 MG SL tablet Place 0.4 mg under the tongue every 5 (five) minutes as needed for chest pain.    [provider]  potassium chloride (KLOR-CON) 10 MEQ tablet Take 1 tablet (10 mEq total) by mouth daily. 08/27/19   Rai, Vernelle Emerald, MD  predniSONE (DELTASONE) 1 MG tablet Take by mouth. Take 2 tablets at bedtime    [provider]  predniSONE (DELTASONE) 20 MG tablet Please continue prednisone 40 mg daily for 5 days.  After the completion, continue with your daily maintenance dose of prednisone. 08/27/19   Rai, Ripudeep K, MD  predniSONE (DELTASONE) 5 MG tablet Take 5 mg by mouth daily with breakfast.    [provider]  risedronate (ACTONEL) 150 MG tablet Take 150 mg by mouth every 30 (thirty) days. with water on empty stomach, nothing by mouth or lie down for next 30 minutes.    [provider]  senna (SENOKOT) 8.6 MG tablet Take 2 tablets by mouth at bedtime.     [provider]  thiamine (VITAMIN B-1) 100 MG tablet Take 100 mg by mouth daily.    [provider]  vitamin E 400 UNIT capsule Take 400 Units by mouth daily.    [provider]    Allergies  Allergen Reactions  . Penicillins  Other (See Comments)    Reaction:  Unknown  Has patient had a PCN reaction causing immediate rash, facial/tongue/throat swelling, SOB or lightheadedness with hypotension: unknown Has patient had a PCN reaction causing severe rash involving mucus membranes or skin necrosis: unknown Has patient had a PCN reaction that required hospitalization No Has patient had a PCN reaction occurring within the last 10 years: No If all of the above answers are "NO", then may proceed with Cephalosporin use.   . Augmentin [Amoxicillin-Pot Clavulanate] Other (See Comments)    Reaction:  Unknown   . Indocin [Indomethacin] Other (See Comments)    Reaction:  Unknown   . Lodine [Etodolac] Other (See Comments)    Reaction:  GI upset   . Methotrexate Derivatives Other (See Comments)    Reaction:  GI upset   . Gold Rash  . Zantac [Ranitidine Hcl] Rash    Family History  Problem Relation Age of Onset  . Hypertension Other     Social History Social History   Tobacco Use  . Smoking status: Former Smoker    Quit date: 1974    Years since quitting: 47.5  . Smokeless tobacco: Never Used  Substance Use Topics  . Alcohol use: Yes    Comment: occassional  . Drug use: No    Review of Systems Constitutional: Negative for fever Cardiovascular: Negative for chest pain. Respiratory: Negative for shortness of breath. Gastrointestinal: Negative for abdominal pain Musculoskeletal: Left wrist pain, right hand pain Neurological: Negative for headache All other ROS negative  ____________________________________________   PHYSICAL EXAM:  VITAL SIGNS: ED Triage Vitals  Enc Vitals Group     BP 09/16/19 0758 (!) 165/76     Pulse Rate 09/16/19 0758 (!) 103     Resp 09/16/19 0758 18     Temp 09/16/19 0758 97.7 F (36.5 C)     Temp Source 09/16/19 0758 Oral     SpO2 09/16/19 0758 94 %     Weight 09/16/19 0754 164 lb (74.4 kg)     Height 09/16/19 0754 5' 6"  (1.676 m)     Head Circumference --      Peak  Flow --      Pain Score 09/16/19 0753 6     Pain Loc --      Pain Edu? --      Excl. in [place]? --     Constitutional: Alert and oriented. Well appearing and in no distress. Eyes: Normal exam ENT      Head: Small laceration lateral to the right eyebrow, hemostatic dried blood present.      Nose: No congestion/rhinnorhea.      Mouth/Throat: Mucous membranes are moist. Cardiovascular: Normal rate, regular rhythm. Respiratory: Normal respiratory effort without tachypnea nor retractions. Breath sounds are clear Gastrointestinal: Soft and nontender. No distention.   Musculoskeletal: Mild pain to the center of the right hand.  Mild tenderness to the left wrist but good range of motion.  Neurovascular intact distally. Neurologic:  Normal speech and language. No gross focal neurologic deficits Skin: Bruising to the center of her right hand, mild bruising to the lateral aspect the left wrist.  Small laceration lateral to the right eyebrow. Psychiatric: Mood and affect are normal.  ____________________________________________     RADIOLOGY  CT scan of the head is negative for acute abnormality. X-ray shows possible impaction fracture.  ____________________________________________   INITIAL IMPRESSION / ASSESSMENT AND PLAN / ED COURSE  Pertinent labs & imaging results that were available during my care of the patient were reviewed by me and considered in my medical decision making (see chart for details).   Patient presents to the emergency department for mechanical fall this morning.  Patient did hit her head, is on anticoagulation per patient.  Patient CT scan of the head is negative for acute abnormality.  We will clean the dried blood likely will require Dermabond only.  Patient is x-ray shows possible impaction fracture.  We will obtain dedicated left wrist films to further evaluate.  Patient also states mild right hand pain we will obtain right hand x-rays as well.  Overall patient  appears extremely well.  Wrist x-ray confirms distal metaphysis impaction fracture.  We will place in  a sugar tong splint and sling.  Small amount Dermabond applied to laceration with good result.  Patient will be discharged with orthopedic follow-up in 1 week for reevaluation.  APRYLE STOWELL was evaluated in Emergency Department on 09/16/2019 for the symptoms described in the history of present illness. She was evaluated in the context of the global COVID-19 pandemic, which necessitated consideration that the patient might be at risk for infection with the SARS-CoV-2 virus that causes COVID-19. Institutional protocols and algorithms that pertain to the evaluation of patients at risk for COVID-19 are in a state of rapid change based on information released by regulatory bodies including the CDC and federal and state organizations. These policies and algorithms were followed during the patient's care in the ED.  ____________________________________________   FINAL CLINICAL IMPRESSION(S) / ED DIAGNOSES  Fall Left distal radius fracture   Harvest Dark, MD 09/16/19 1005

## 2019-10-24 NOTE — Progress Notes (Signed)
Patient ID: Raven [name], female    DOB: 1934/06/01, 85 y.o.   MRN: [id number]  HPI  Raven [name] is a 85 y/o female with a history of asthma, hyperlipidemia, HTN, COPD, CAD (MI), anemia, depression, previous tobacco use and chronic heart failure.   Echo report from 08/26/19 reviewed and showed an EF of 40-45% along with mildly elevated PA pressure and moderate MR.   Was in the ED 09/16/19 due to a mechanical fall after tripping over a garden hose. Laceration noticed above right eyebrow. Head CT negative. Laceration closed. Left wrist xray done due to pain and showed distal wrist fracture. Put in splint and sling and released. Admitted 08/25/19 due to HF/ COPD exacerbation. Cardiology consult obtained. Initially given IV lasix with transition to oral diuretics. Given antibiotics, nebulizers and steroids. Elevated troponin thought to be due to demand ischemia. Discharged after 2 days.   She presents today for her initial visit with a chief complaint of minimal shortness of breath upon moderate exertion. She describes this as chronic in nature having been present for several years. She has associated fatigue, cough, wheezing, pedal edema, light-headedness and chronic difficulty sleeping along with this. She denies any abdominal distention, palpitations, chest pain or weight gain.   She says that she feels like her diuretic worked better when she took it at bedtime instead of in the morning. She says that she's up and down all night anyways so it doesn't matter if she takes the diuretic at bedtime.   Past Medical History:  Diagnosis Date  . Anemia   . Arthritis   . Asthma   . CHF (congestive heart failure) ([place])   . Complication of anesthesia   . COPD (chronic obstructive pulmonary disease) ([place])   . Crohn's disease ([place])   . Depression    after death of husband  . Hyperlipidemia   . Hypertension   . Myocardial infarction ([place])   . Osteopenia   . Peptic ulcer disease   . Pneumonia   . PONV  (postoperative nausea and vomiting)    Past Surgical History:  Procedure Laterality Date  . ABDOMINAL SURGERY    . CARDIAC CATHETERIZATION     with stent  . COLONOSCOPY WITH PROPOFOL N/A 09/27/2014   Procedure: COLONOSCOPY WITH PROPOFOL;  Surgeon: [name], MD;  Location: [facility] ENDOSCOPY;  Service: Gastroenterology;  Laterality: N/A;  . COLONOSCOPY WITH PROPOFOL N/A 06/21/2019   Procedure: COLONOSCOPY WITH PROPOFOL;  Surgeon: Lin Landsman, MD;  Location: [facility] ENDOSCOPY;  Service: Gastroenterology;  Laterality: N/A;  . EYE SURGERY     bilateral cataract surgeries  . SHOULDER SURGERY Right    x 2   . SMALL INTESTINE SURGERY     Family History  Problem Relation Age of Onset  . Hypertension Other    Social History   Tobacco Use  . Smoking status: Former Smoker    Quit date: 1974    Years since quitting: 47.6  . Smokeless tobacco: Never Used  Substance Use Topics  . Alcohol use: Yes    Comment: occassional   Allergies  Allergen Reactions  . Penicillins Other (See Comments)    Reaction:  Unknown  Has patient had a PCN reaction causing immediate rash, facial/tongue/throat swelling, SOB or lightheadedness with hypotension: unknown Has patient had a PCN reaction causing severe rash involving mucus membranes or skin necrosis: unknown Has patient had a PCN reaction that required hospitalization No Has patient had a PCN reaction occurring within the last  10 years: No If all of the above answers are "NO", then may proceed with Cephalosporin use.   . Augmentin [Amoxicillin-Pot Clavulanate] Other (See Comments)    Reaction:  Unknown   . Indocin [Indomethacin] Other (See Comments)    Reaction:  Unknown   . Lodine [Etodolac] Other (See Comments)    Reaction:   GI upset   . Methotrexate Derivatives Other (See Comments)    Reaction:  GI upset   . Gold Rash  . Zantac [Ranitidine Hcl] Rash   Prior to Admission medications   Medication Sig Start Date End Date Taking? Authorizing  Provider  acetaminophen (TYLENOL) 500 MG tablet Take 500 mg by mouth every 6 (six) hours as needed.   Yes [provider]  Adalimumab (HUMIRA) 40 MG/0.8ML PSKT Inject 40 mg into the skin every 14 (fourteen) days.    Yes [provider]  albuterol (PROVENTIL HFA;VENTOLIN HFA) 108 (90 BASE) MCG/ACT inhaler Inhale 2 puffs into the lungs every 6 (six) hours as needed for wheezing or shortness of breath.   Yes [provider]  amitriptyline (ELAVIL) 10 MG tablet Take 20 mg by mouth at bedtime.   Yes [provider]  ANORO ELLIPTA 62.5-25 MCG/INH AEPB Inhale 1 puff into the lungs daily. 05/29/19  Yes [provider]  aspirin EC 81 MG tablet Take 81 mg by mouth daily.   Yes [provider]  atorvastatin (LIPITOR) 80 MG tablet Take 80 mg by mouth at bedtime.    Yes [provider]  Calcium-Phosphorus-Vitamin D (CITRACAL +D3 PO) Take 1 tablet by mouth daily.   Yes [provider]  clonazePAM (KLONOPIN) 0.25 MG disintegrating tablet Take 1 tablet (0.25 mg total) by mouth daily as needed (anxiety). 08/27/19  Yes Rai, Ripudeep K, MD  Cobalamin Combinations (VITAMIN B12-FOLIC ACID) 798-921 MCG TABS Take by mouth daily.   Yes [provider]  esomeprazole (NEXIUM) 40 MG capsule Take 40 mg by mouth daily. 05/29/19  Yes [provider]  furosemide (LASIX) 40 MG tablet Take 1 tablet (40 mg total) by mouth daily. 08/27/19  Yes Rai, Ripudeep K, MD  leflunomide (ARAVA) 10 MG tablet Take 10 mg by mouth daily. 03/29/19  Yes [provider]  lisinopril (ZESTRIL) 2.5 MG tablet Take 1 tablet (2.5 mg total) by mouth daily. 08/28/19  Yes Rai, Ripudeep K, MD  mesalamine (PENTASA) 500 MG CR capsule Take 1,000 mg by mouth 2 (two) times daily.   Yes [provider]  metoprolol succinate (TOPROL-XL) 50 MG 24 hr tablet Take 50 mg by mouth daily.    Yes [provider]  Multiple Vitamin (MULTIVITAMIN WITH MINERALS) TABS  tablet Take 1 tablet by mouth daily.   Yes [provider]  nitroGLYCERIN (NITROSTAT) 0.4 MG SL tablet Place 0.4 mg under the tongue every 5 (five) minutes as needed for chest pain.   Yes [provider]  potassium chloride (KLOR-CON) 10 MEQ tablet Take 1 tablet (10 mEq total) by mouth daily. 08/27/19  Yes Rai, Ripudeep K, MD  predniSONE (DELTASONE) 1 MG tablet Take by mouth. Take 2 tablets at bedtime   Yes [provider]  predniSONE (DELTASONE) 5 MG tablet Take 5 mg by mouth daily with breakfast.   Yes [provider]  risedronate (ACTONEL) 150 MG tablet Take 150 mg by mouth every 30 (thirty) days. with water on empty stomach, nothing by mouth or lie down for next 30 minutes.   Yes [provider]  senna Donavan Burnet)  8.6 MG tablet Take 2 tablets by mouth at bedtime.    Yes [provider]  thiamine (VITAMIN B-1) 100 MG tablet Take 100 mg by mouth daily.   Yes [provider]  vitamin E 400 UNIT capsule Take 400 Units by mouth daily.   Yes [provider]     Review of Systems  Constitutional: Positive for fatigue. Negative for appetite change.  HENT: Negative for congestion, postnasal drip and sore throat.   Eyes: Negative.   Respiratory: Positive for cough, shortness of breath and wheezing.   Cardiovascular: Positive for leg swelling. Negative for chest pain and palpitations.  Gastrointestinal: Negative for abdominal distention and abdominal pain.  Endocrine: Negative.   Genitourinary: Negative.   Musculoskeletal: Positive for arthralgias (left wrist). Negative for back pain.  Skin: Negative.   Allergic/Immunologic: Negative.   Neurological: Positive for light-headedness. Negative for dizziness.  Hematological: Negative for adenopathy. Bruises/bleeds easily.  Psychiatric/Behavioral: Positive for sleep disturbance (intermittently; sleeping on 1 pillow). Negative for dysphoric mood. The patient is not nervous/anxious.     Vitals:   10/25/19 1026  BP: (!) 142/96  Pulse: 83  Resp: 18  SpO2: 97%  Weight: 168 lb (76.2 kg)  Height: 5' 6"  (1.676 m)   Wt Readings from Last 3 Encounters:  10/25/19 168 lb (76.2 kg)  09/16/19 164 lb (74.4 kg)  08/27/19 170 lb 1.6 oz (77.2 kg)   Lab Results  Component Value Date   CREATININE 0.89 08/27/2019   CREATININE 0.86 08/26/2019   CREATININE 0.84 08/25/2019     Physical Exam Vitals and nursing note reviewed.  Constitutional:      Appearance: She is well-developed.  HENT:     Head: Normocephalic and atraumatic.  Neck:     Vascular: No JVD.  Cardiovascular:     Rate and Rhythm: Normal rate and regular rhythm.  Pulmonary:     Effort: Pulmonary effort is normal. No respiratory distress.     Breath sounds: No wheezing or rales.  Abdominal:     Palpations: Abdomen is soft.     Tenderness: There is no abdominal tenderness.  Musculoskeletal:     Cervical back: Neck supple.     Right lower leg: No tenderness. Edema (1+ pitting) present.     Left lower leg: No tenderness. Edema (1+ pitting) present.  Skin:    General: Skin is warm and dry.  Neurological:     General: No focal deficit present.     Mental Status: She is alert and oriented to person, place, and time.  Psychiatric:        Mood and Affect: Mood normal.        Behavior: Behavior normal.    Assessment & Plan:  1: Chronic heart failure with reduced ejection fraction- - NYHA class II - euvolemic today - weighing daily; reminded to call for an overnight weight gain of >2 pounds or a weekly weight gain of >5 pounds - using Raven [name] seasoning on her foods and tries to look at food labels; written dietary information and low sodium cookbook were given to her - discussed possibly changing her lisinopril to entresto in the future - saw cardiology (Raven [name]) 09/18/19 - BNP 08/25/19 was 635.0 - reports receiving both COVID vaccines  2: HTN- - BP mildly elevated today - saw PCP Raven [name])  09/22/19 - BMP 08/27/19 reviewed and showed sodium 140, potassium 4.3, creatinine 0.89 and GFR 60  3: COPD- - saw pulmonology Raven [name]) 08/21/19 - wearing oxygen at 3L  QHS and PRN during the day  4: Lymphedema- - stage 2 - planning on starting pulmonary rehab soon - elevates her legs but not as consistently as she should; encouraged her to elevate her legs when sitting for long periods of time - has compression socks and she was instructed to put them on every morning with removal at bedtime - advised that she could take her diuretic at bedtime if she preferred; she says that her medications are pre-packaged and she doesn't know which tablet is her diuretic; explained that she would have to take them to her pharmacy and see if they can show her which one it is - consider compression boots if edema persists   Patient did not bring her medications nor a list. Each medication was verbally reviewed with the patient and she was encouraged to bring the bottles to every visit to confirm accuracy of list.  Return in 6 weeks or sooner for any questions/problems before then.

## 2019-10-25 ENCOUNTER — Other Ambulatory Visit: Payer: Self-pay

## 2019-10-25 ENCOUNTER — Encounter: Payer: Self-pay | Admitting: Family

## 2019-10-25 ENCOUNTER — Ambulatory Visit: Payer: Medicare Other | Attending: Family | Admitting: Family

## 2019-10-25 VITALS — BP 142/96 | HR 83 | Resp 18 | Ht 66.0 in | Wt 168.0 lb

## 2019-10-25 DIAGNOSIS — M1990 Unspecified osteoarthritis, unspecified site: Secondary | ICD-10-CM | POA: Diagnosis not present

## 2019-10-25 DIAGNOSIS — R05 Cough: Secondary | ICD-10-CM | POA: Insufficient documentation

## 2019-10-25 DIAGNOSIS — I252 Old myocardial infarction: Secondary | ICD-10-CM | POA: Diagnosis not present

## 2019-10-25 DIAGNOSIS — Z7982 Long term (current) use of aspirin: Secondary | ICD-10-CM | POA: Diagnosis not present

## 2019-10-25 DIAGNOSIS — E785 Hyperlipidemia, unspecified: Secondary | ICD-10-CM | POA: Insufficient documentation

## 2019-10-25 DIAGNOSIS — Z79899 Other long term (current) drug therapy: Secondary | ICD-10-CM | POA: Insufficient documentation

## 2019-10-25 DIAGNOSIS — F329 Major depressive disorder, single episode, unspecified: Secondary | ICD-10-CM | POA: Insufficient documentation

## 2019-10-25 DIAGNOSIS — R42 Dizziness and giddiness: Secondary | ICD-10-CM | POA: Insufficient documentation

## 2019-10-25 DIAGNOSIS — I110 Hypertensive heart disease with heart failure: Secondary | ICD-10-CM | POA: Insufficient documentation

## 2019-10-25 DIAGNOSIS — R0602 Shortness of breath: Secondary | ICD-10-CM | POA: Diagnosis present

## 2019-10-25 DIAGNOSIS — Z7901 Long term (current) use of anticoagulants: Secondary | ICD-10-CM | POA: Insufficient documentation

## 2019-10-25 DIAGNOSIS — M8580 Other specified disorders of bone density and structure, unspecified site: Secondary | ICD-10-CM | POA: Insufficient documentation

## 2019-10-25 DIAGNOSIS — Z87891 Personal history of nicotine dependence: Secondary | ICD-10-CM | POA: Diagnosis not present

## 2019-10-25 DIAGNOSIS — I10 Essential (primary) hypertension: Secondary | ICD-10-CM

## 2019-10-25 DIAGNOSIS — J441 Chronic obstructive pulmonary disease with (acute) exacerbation: Secondary | ICD-10-CM | POA: Insufficient documentation

## 2019-10-25 DIAGNOSIS — I890 Lymphedema, not elsewhere classified: Secondary | ICD-10-CM | POA: Diagnosis not present

## 2019-10-25 DIAGNOSIS — I2510 Atherosclerotic heart disease of native coronary artery without angina pectoris: Secondary | ICD-10-CM | POA: Diagnosis not present

## 2019-10-25 DIAGNOSIS — K5090 Crohn's disease, unspecified, without complications: Secondary | ICD-10-CM | POA: Insufficient documentation

## 2019-10-25 DIAGNOSIS — I5022 Chronic systolic (congestive) heart failure: Secondary | ICD-10-CM | POA: Diagnosis not present

## 2019-10-25 DIAGNOSIS — J449 Chronic obstructive pulmonary disease, unspecified: Secondary | ICD-10-CM

## 2019-10-25 NOTE — Patient Instructions (Signed)
Continue weighing daily and call for an overnight weight gain of > 2 pounds or a weekly weight gain of >5 pounds. 

## 2019-11-22 ENCOUNTER — Inpatient Hospital Stay
Admission: EM | Admit: 2019-11-22 | Discharge: 2019-11-26 | DRG: 291 | Disposition: A | Discharge status: Home / Self Care | Payer: Medicare Other | Attending: Internal Medicine | Admitting: Internal Medicine

## 2019-11-22 ENCOUNTER — Encounter: Payer: Self-pay | Admitting: Emergency Medicine

## 2019-11-22 ENCOUNTER — Emergency Department: Payer: Medicare Other

## 2019-11-22 ENCOUNTER — Other Ambulatory Visit: Payer: Self-pay

## 2019-11-22 DIAGNOSIS — Z20822 Contact with and (suspected) exposure to covid-19: Secondary | ICD-10-CM | POA: Diagnosis present

## 2019-11-22 DIAGNOSIS — I5023 Acute on chronic systolic (congestive) heart failure: Secondary | ICD-10-CM | POA: Diagnosis present

## 2019-11-22 DIAGNOSIS — I252 Old myocardial infarction: Secondary | ICD-10-CM | POA: Diagnosis not present

## 2019-11-22 DIAGNOSIS — Z7982 Long term (current) use of aspirin: Secondary | ICD-10-CM

## 2019-11-22 DIAGNOSIS — E876 Hypokalemia: Secondary | ICD-10-CM | POA: Diagnosis present

## 2019-11-22 DIAGNOSIS — E785 Hyperlipidemia, unspecified: Secondary | ICD-10-CM | POA: Diagnosis present

## 2019-11-22 DIAGNOSIS — I509 Heart failure, unspecified: Secondary | ICD-10-CM

## 2019-11-22 DIAGNOSIS — E875 Hyperkalemia: Secondary | ICD-10-CM | POA: Diagnosis not present

## 2019-11-22 DIAGNOSIS — Z888 Allergy status to other drugs, medicaments and biological substances status: Secondary | ICD-10-CM

## 2019-11-22 DIAGNOSIS — Z880 Allergy status to penicillin: Secondary | ICD-10-CM

## 2019-11-22 DIAGNOSIS — I10 Essential (primary) hypertension: Secondary | ICD-10-CM | POA: Diagnosis present

## 2019-11-22 DIAGNOSIS — Z8249 Family history of ischemic heart disease and other diseases of the circulatory system: Secondary | ICD-10-CM

## 2019-11-22 DIAGNOSIS — M069 Rheumatoid arthritis, unspecified: Secondary | ICD-10-CM | POA: Diagnosis not present

## 2019-11-22 DIAGNOSIS — J9621 Acute and chronic respiratory failure with hypoxia: Secondary | ICD-10-CM

## 2019-11-22 DIAGNOSIS — I110 Hypertensive heart disease with heart failure: Principal | ICD-10-CM | POA: Diagnosis present

## 2019-11-22 DIAGNOSIS — R531 Weakness: Secondary | ICD-10-CM

## 2019-11-22 DIAGNOSIS — J449 Chronic obstructive pulmonary disease, unspecified: Secondary | ICD-10-CM | POA: Diagnosis present

## 2019-11-22 DIAGNOSIS — K5090 Crohn's disease, unspecified, without complications: Secondary | ICD-10-CM | POA: Diagnosis present

## 2019-11-22 DIAGNOSIS — I2510 Atherosclerotic heart disease of native coronary artery without angina pectoris: Secondary | ICD-10-CM | POA: Diagnosis present

## 2019-11-22 DIAGNOSIS — Z87891 Personal history of nicotine dependence: Secondary | ICD-10-CM

## 2019-11-22 DIAGNOSIS — F329 Major depressive disorder, single episode, unspecified: Secondary | ICD-10-CM | POA: Diagnosis present

## 2019-11-22 DIAGNOSIS — Z955 Presence of coronary angioplasty implant and graft: Secondary | ICD-10-CM

## 2019-11-22 DIAGNOSIS — M79604 Pain in right leg: Secondary | ICD-10-CM | POA: Diagnosis not present

## 2019-11-22 DIAGNOSIS — Z79899 Other long term (current) drug therapy: Secondary | ICD-10-CM

## 2019-11-22 DIAGNOSIS — I739 Peripheral vascular disease, unspecified: Secondary | ICD-10-CM | POA: Diagnosis present

## 2019-11-22 DIAGNOSIS — Z881 Allergy status to other antibiotic agents status: Secondary | ICD-10-CM | POA: Diagnosis not present

## 2019-11-22 DIAGNOSIS — Z7952 Long term (current) use of systemic steroids: Secondary | ICD-10-CM

## 2019-11-22 DIAGNOSIS — I5022 Chronic systolic (congestive) heart failure: Secondary | ICD-10-CM | POA: Diagnosis present

## 2019-11-22 DIAGNOSIS — Z23 Encounter for immunization: Secondary | ICD-10-CM | POA: Diagnosis not present

## 2019-11-22 DIAGNOSIS — F418 Other specified anxiety disorders: Secondary | ICD-10-CM | POA: Diagnosis present

## 2019-11-22 DIAGNOSIS — Z9981 Dependence on supplemental oxygen: Secondary | ICD-10-CM

## 2019-11-22 DIAGNOSIS — M79605 Pain in left leg: Secondary | ICD-10-CM | POA: Diagnosis not present

## 2019-11-22 DIAGNOSIS — J9601 Acute respiratory failure with hypoxia: Secondary | ICD-10-CM | POA: Diagnosis not present

## 2019-11-22 DIAGNOSIS — J9611 Chronic respiratory failure with hypoxia: Secondary | ICD-10-CM

## 2019-11-22 LAB — CBC
HCT: 36.9 % (ref 36.0–46.0)
Hemoglobin: 12 g/dL (ref 12.0–15.0)
MCH: 31.1 pg (ref 26.0–34.0)
MCHC: 32.5 g/dL (ref 30.0–36.0)
MCV: 95.6 fL (ref 80.0–100.0)
Platelets: 216 10*3/uL (ref 150–400)
RBC: 3.86 MIL/uL — ABNORMAL LOW (ref 3.87–5.11)
RDW: 14.5 % (ref 11.5–15.5)
WBC: 7.6 10*3/uL (ref 4.0–10.5)
nRBC: 0 % (ref 0.0–0.2)

## 2019-11-22 LAB — BASIC METABOLIC PANEL
Anion gap: 13 (ref 5–15)
BUN: 24 mg/dL — ABNORMAL HIGH (ref 8–23)
CO2: 25 mmol/L (ref 22–32)
Calcium: 9 mg/dL (ref 8.9–10.3)
Chloride: 107 mmol/L (ref 98–111)
Creatinine, Ser: 0.89 mg/dL (ref 0.44–1.00)
GFR calc Af Amer: >60 mL/min (ref >60)
GFR calc non Af Amer: 60 mL/min — ABNORMAL LOW (ref >60)
Glucose, Bld: 112 mg/dL — ABNORMAL HIGH (ref 70–99)
Potassium: 3 mmol/L — ABNORMAL LOW (ref 3.5–5.1)
Sodium: 145 mmol/L (ref 135–145)

## 2019-11-22 LAB — SARS CORONAVIRUS 2 BY RT PCR (HOSPITAL ORDER, PERFORMED IN ~~LOC~~ HOSPITAL LAB): SARS Coronavirus 2: NEGATIVE

## 2019-11-22 LAB — BRAIN NATRIURETIC PEPTIDE: B Natriuretic Peptide: 2993.8 pg/mL — ABNORMAL HIGH (ref 0.0–100.0)

## 2019-11-22 MED ORDER — METOPROLOL SUCCINATE ER 50 MG PO TB24
50.0000 mg | ORAL_TABLET | Freq: Every day | ORAL | Status: DC
  Administered 2019-11-23 – 2019-11-26 (×4): 50 mg via ORAL
  Filled 2019-11-22 (×5): qty 1

## 2019-11-22 MED ORDER — FUROSEMIDE 10 MG/ML IJ SOLN
80.0000 mg | Freq: Two times a day (BID) | INTRAMUSCULAR | Status: DC
  Administered 2019-11-23 – 2019-11-25 (×6): 80 mg via INTRAVENOUS
  Filled 2019-11-22 (×6): qty 8

## 2019-11-22 MED ORDER — SODIUM CHLORIDE 0.9% FLUSH: 3.0000 mL | INTRAVENOUS | Status: DC | PRN

## 2019-11-22 MED ORDER — ATORVASTATIN CALCIUM 80 MG PO TABS
80.0000 mg | ORAL_TABLET | Freq: Every day | ORAL | Status: DC
  Administered 2019-11-22 – 2019-11-23 (×2): 80 mg via ORAL
  Filled 2019-11-22 (×2): qty 4

## 2019-11-22 MED ORDER — SODIUM CHLORIDE 0.9 % IV SOLN: 250.0000 mL | INTRAVENOUS | Status: DC | PRN

## 2019-11-22 MED ORDER — ENOXAPARIN SODIUM 40 MG/0.4ML ~~LOC~~ SOLN
40.0000 mg | SUBCUTANEOUS | Status: DC
  Administered 2019-11-23 – 2019-11-25 (×3): 40 mg via SUBCUTANEOUS
  Filled 2019-11-22 (×4): qty 0.4

## 2019-11-22 MED ORDER — FUROSEMIDE 10 MG/ML IJ SOLN
80.0000 mg | Freq: Once | INTRAMUSCULAR | Status: AC
  Administered 2019-11-22: 80 mg via INTRAVENOUS
  Filled 2019-11-22: qty 8

## 2019-11-22 MED ORDER — ONDANSETRON HCL 4 MG/2ML IJ SOLN: 4.0000 mg | Freq: Four times a day (QID) | INTRAMUSCULAR | Status: DC | PRN

## 2019-11-22 MED ORDER — POTASSIUM CHLORIDE CRYS ER 20 MEQ PO TBCR
40.0000 meq | EXTENDED_RELEASE_TABLET | Freq: Once | ORAL | Status: AC
  Administered 2019-11-22: 40 meq via ORAL
  Filled 2019-11-22: qty 2

## 2019-11-22 MED ORDER — SODIUM CHLORIDE 0.9% FLUSH
3.0000 mL | Freq: Two times a day (BID) | INTRAVENOUS | Status: DC
  Administered 2019-11-22 – 2019-11-26 (×7): 3 mL via INTRAVENOUS

## 2019-11-22 MED ORDER — ACETAMINOPHEN 325 MG PO TABS: 650.0000 mg | ORAL_TABLET | ORAL | Status: DC | PRN

## 2019-11-22 MED ORDER — LISINOPRIL 5 MG PO TABS
2.5000 mg | ORAL_TABLET | Freq: Every day | ORAL | Status: DC
  Administered 2019-11-23 – 2019-11-26 (×4): 2.5 mg via ORAL
  Filled 2019-11-22 (×5): qty 1

## 2019-11-22 NOTE — ED Triage Notes (Addendum)
Pt in via POV, reports swelling and pain to RLE x 2 days, went to [facility] where she was sent here for rule out DVT.  No redness or warmth noted at this time.  Pt reports Lasix was upped to 60m/day x 7 days but when she went back to 471mday, swelling has increased.  Denies any other complaints.  Vitals WDL, NAD noted at this time.

## 2019-11-22 NOTE — ED Provider Notes (Signed)
[facility] Emergency Department Provider Note ____________________________________________   First MD Initiated Contact with Patient 11/22/19 1936     (approximate)  I have reviewed the triage vital signs and the nursing notes.   HISTORY  Chief Complaint Leg Swelling  HPI PEOLA JOYNT is a 85 y.o. female with history of asthma, COPD, hypertension, MI, CHF, and anemia presents to the emergency department for treatment and evaluation of right lower extremity swelling for the past 2 days.  Patient presented to the walk-in clinic and they advised her to come here to rule out DVT.  Patient was taking Lasix 80 mg/day for a week and then she went back to her normal dose of 40 mg/day and the swelling has increased since that time. Positive for shortness of breath.  No calf pain or tenderness. She has gained about 18 pounds since extremity swelling started.     Past Medical History:  Diagnosis Date   Anemia    Arthritis    Asthma    CHF (congestive heart failure) (HCC)    Complication of anesthesia    COPD (chronic obstructive pulmonary disease) (HCC)    Crohn's disease (HCC)    Depression    after death of husband   Hyperlipidemia    Hypertension    Myocardial infarction ([place])    Osteopenia    Peptic ulcer disease    Pneumonia    PONV (postoperative nausea and vomiting)     Patient Active Problem List   Diagnosis Date Noted   Acute on chronic systolic CHF (congestive heart failure) ([place]) 11/22/2019   Acute on chronic systolic (congestive) heart failure ([place]) 11/22/2019   Chronic respiratory failure with hypoxia ([place]) 11/22/2019   HLD (hyperlipidemia) 08/25/2019   HTN (hypertension) 08/25/2019   Depression    CAD (coronary artery disease)    Elevated troponin    Hypokalemia    Crohn's disease ([place])    Acute on chronic respiratory failure with hypoxia (HCC)    Steroid-dependent COPD ([place])    SBO (small bowel  obstruction) ([place]) 06/17/2019   Crohn's disease of small intestine with intestinal obstruction ([place])    COPD exacerbation ([place]) 10/20/2018   COPD (chronic obstructive pulmonary disease) ([place]) 04/22/2016   Acute bronchitis 04/17/2016   Sepsis ([place]) 04/17/2016   Peroneal tendonitis 07/18/2015   Pneumonia 04/19/2015   CAP (community acquired pneumonia) 11/02/2014    Past Surgical History:  Procedure Laterality Date   ABDOMINAL SURGERY     CARDIAC CATHETERIZATION     with stent   COLONOSCOPY WITH PROPOFOL N/A 09/27/2014   Procedure: COLONOSCOPY WITH PROPOFOL;  Surgeon: [name], MD;  Location: [facility] ENDOSCOPY;  Service: Gastroenterology;  Laterality: N/A;   COLONOSCOPY WITH PROPOFOL N/A 06/21/2019   Procedure: COLONOSCOPY WITH PROPOFOL;  Surgeon: Lin Landsman, MD;  Location: [facility] ENDOSCOPY;  Service: Gastroenterology;  Laterality: N/A;   EYE SURGERY     bilateral cataract surgeries   SHOULDER SURGERY Right    x 2    SMALL INTESTINE SURGERY      Prior to Admission medications   Medication Sig Start Date End Date Taking? Authorizing Provider  acetaminophen (TYLENOL) 500 MG tablet Take 500 mg by mouth every 6 (six) hours as needed.    [provider]  Adalimumab (HUMIRA) 40 MG/0.8ML PSKT Inject 40 mg into the skin every 14 (fourteen) days.     [provider]  albuterol (PROVENTIL HFA;VENTOLIN HFA) 108 (90 BASE) MCG/ACT inhaler Inhale 2 puffs into  the lungs every 6 (six) hours as needed for wheezing or shortness of breath.    [provider]  amitriptyline (ELAVIL) 10 MG tablet Take 20 mg by mouth at bedtime.    [provider]  ANORO ELLIPTA 62.5-25 MCG/INH AEPB Inhale 1 puff into the lungs daily. 05/29/19   [provider]  aspirin EC 81 MG tablet Take 81 mg by mouth daily.    [provider]  atorvastatin (LIPITOR) 80 MG tablet Take 80 mg by mouth at bedtime.     [provider]    Calcium-Phosphorus-Vitamin D (CITRACAL +D3 PO) Take 1 tablet by mouth daily.    [provider]  clonazePAM (KLONOPIN) 0.25 MG disintegrating tablet Take 1 tablet (0.25 mg total) by mouth daily as needed (anxiety). 08/27/19   Rai, Vernelle Emerald, MD  Cobalamin Combinations (VITAMIN B12-FOLIC ACID) 196-222 MCG TABS Take by mouth daily.    [provider]  esomeprazole (NEXIUM) 40 MG capsule Take 40 mg by mouth daily. 05/29/19   [provider]  furosemide (LASIX) 40 MG tablet Take 1 tablet (40 mg total) by mouth daily. 08/27/19   Rai, Vernelle Emerald, MD  leflunomide (ARAVA) 10 MG tablet Take 10 mg by mouth daily. 03/29/19   [provider]  lisinopril (ZESTRIL) 2.5 MG tablet Take 1 tablet (2.5 mg total) by mouth daily. 08/28/19   Rai, Vernelle Emerald, MD  mesalamine (PENTASA) 500 MG CR capsule Take 1,000 mg by mouth 2 (two) times daily.    [provider]  metoprolol succinate (TOPROL-XL) 50 MG 24 hr tablet Take 50 mg by mouth daily.     [provider]  Multiple Vitamin (MULTIVITAMIN WITH MINERALS) TABS tablet Take 1 tablet by mouth daily.    [provider]  nitroGLYCERIN (NITROSTAT) 0.4 MG SL tablet Place 0.4 mg under the tongue every 5 (five) minutes as needed for chest pain.    [provider]  potassium chloride (KLOR-CON) 10 MEQ tablet Take 1 tablet (10 mEq total) by mouth daily. 08/27/19   Rai, Vernelle Emerald, MD  predniSONE (DELTASONE) 1 MG tablet Take by mouth. Take 2 tablets at bedtime    [provider]  predniSONE (DELTASONE) 5 MG tablet Take 5 mg by mouth daily with breakfast.    [provider]  risedronate (ACTONEL) 150 MG tablet Take 150 mg by mouth every 30 (thirty) days. with water on empty stomach, nothing by mouth or lie down for next 30 minutes.    [provider]  senna (SENOKOT) 8.6 MG tablet Take 2 tablets by mouth at bedtime.     [provider]  thiamine (VITAMIN B-1) 100 MG tablet Take  100 mg by mouth daily.    [provider]  vitamin E 400 UNIT capsule Take 400 Units by mouth daily.    [provider]    Allergies Penicillins, Augmentin [amoxicillin-pot clavulanate], Indocin [indomethacin], Lodine [etodolac], Methotrexate derivatives, Gold, and Zantac [ranitidine hcl]  Family History  Problem Relation Age of Onset   Hypertension Other     Social History Social History   Tobacco Use   Smoking status: Former Smoker    Quit date: 1974    Years since quitting: 47.7   Smokeless tobacco: Never Used  Scientific laboratory technician Use: Never used  Substance Use Topics   Alcohol use: Yes    Comment: occassional   Drug use: No    Review of Systems  Constitutional: No fever/chills Eyes: No visual  changes. ENT: No sore throat. Cardiovascular: Denies chest pain. Respiratory: Positive for shortness of breath. Gastrointestinal: No abdominal pain.  No nausea, no vomiting.  No diarrhea.  No constipation. Genitourinary: Negative for dysuria. Musculoskeletal: Negative for back pain. Skin: Negative for rash. Neurological: Negative for headaches, focal weakness or numbness. ____________________________________________   PHYSICAL EXAM:  VITAL SIGNS: ED Triage Vitals  Enc Vitals Group     BP 11/22/19 1733 (!) 155/75     Pulse Rate 11/22/19 1733 85     Resp 11/22/19 1733 16     Temp 11/22/19 1733 99.2 F (37.3 C)     Temp Source 11/22/19 1733 Oral     SpO2 11/22/19 1733 94 %     Weight 11/22/19 1734 176 lb (79.8 kg)     Height 11/22/19 1734 5' 6"  (1.676 m)     Head Circumference --      Peak Flow --      Pain Score 11/22/19 1734 4     Pain Loc --      Pain Edu? --      Excl. in [place]? --     Constitutional: Alert and oriented. Obvious dyspnea appearing and in no acute distress. Eyes: Conjunctivae are normal. Head: Atraumatic. Nose: No congestion/rhinnorhea. Mouth/Throat: Mucous membranes are moist.  Oropharynx non-erythematous. Neck: No  stridor.   Cardiovascular: Normal rate, regular rhythm. Grossly normal heart sounds.  Good peripheral circulation. Bilateral lower extremity 2+ pitting edema > on right.  Respiratory: Increased respiratory effort.  No retractions. Lungs CTAB. Gastrointestinal: Soft and nontender. No abdominal bruits. No CVA tenderness. Genitourinary:  Musculoskeletal: No lower extremity tenderness nor edema.  No joint effusions. Neurologic:  Normal speech and language. No gross focal neurologic deficits are appreciated. No gait instability. Skin:  Skin is warm, dry and intact. No rash noted. Psychiatric: Mood and affect are normal. Speech and behavior are normal.  ____________________________________________   LABS (all labs ordered are listed, but only abnormal results are displayed)  Labs Reviewed  BASIC METABOLIC PANEL - Abnormal; Notable for the following components:      Result Value   Potassium 3.0 (*)    Glucose, Bld 112 (*)    BUN 24 (*)    GFR calc non Af Amer 60 (*)    All other components within normal limits  CBC - Abnormal; Notable for the following components:   RBC 3.86 (*)    All other components within normal limits  BRAIN NATRIURETIC PEPTIDE - Abnormal; Notable for the following components:   B Natriuretic Peptide 2,993.8 (*)    All other components within normal limits  SARS CORONAVIRUS 2 BY RT PCR (HOSPITAL ORDER, [place] [facility])  BASIC METABOLIC PANEL   ____________________________________________  EKG  ED ECG REPORT I, Boneta Standre, FNP-BC personally viewed and interpreted this ECG.   Date: 11/22/2019  EKG Time: 2133  Rate: 85  Rhythm: normal sinus rhythm, PAC's noted  Axis: normal  Intervals:none and left anterior fascicular block  ST&T Change: no ST elevation  ____________________________________________  RADIOLOGY  ED MD interpretation:      I, Sherrie George, personally viewed and evaluated these images (plain radiographs) as part  of my medical decision making, as well as reviewing the written report by the radiologist.  Official radiology report(s): DG Chest 2 View  Result Date: 11/22/2019 CLINICAL DATA:  Pain and swelling two right lower extremity for 2 days. EXAM: CHEST - 2 VIEW COMPARISON:  10/21/2018 FINDINGS: Mild cardiac enlargement. Aortic  atherosclerosis. Elevated right hemidiaphragm, chronic. Diffuse pulmonary vascular congestion. Bibasilar atelectasis versus scarring. IMPRESSION: Cardiac enlargement and diffuse pulmonary vascular congestion. Electronically Signed   By: Kerby Moors M.D.   On: 11/22/2019 21:20   US Venous Img Lower Unilateral Right  Result Date: 11/22/2019 CLINICAL DATA:  Right lower extremity swelling for 2 days EXAM: RIGHT LOWER EXTREMITY VENOUS DOPPLER ULTRASOUND TECHNIQUE: Gray-scale sonography with compression, as well as color and duplex ultrasound, were performed to evaluate the deep venous system(s) from the level of the common femoral vein through the popliteal and proximal calf veins. COMPARISON:  Contralateral Doppler ultrasound 03/01/2016 FINDINGS: VENOUS Normal compressibility of the common femoral, superficial femoral, and popliteal veins, as well as the visualized calf veins. Visualized portions of profunda femoral vein and great saphenous vein unremarkable. No filling defects to suggest DVT on grayscale or color Doppler imaging. Doppler waveforms show normal direction of venous flow, normal respiratory plasticity and response to augmentation. Limited views of the contralateral common femoral vein are unremarkable. OTHER None. Limitations: none IMPRESSION: No femoropopliteal DVT nor evidence of DVT within the visualized calf veins. If clinical symptoms are inconsistent or if there are persistent or worsening symptoms, further imaging (possibly involving the iliac veins) may be warranted. Electronically Signed   By: Lovena Le M.D.   On: 11/22/2019 18:40     ____________________________________________   PROCEDURES  Procedure(s) performed (including Critical Care):  Procedures  ____________________________________________   INITIAL IMPRESSION / ASSESSMENT AND PLAN     85 year old female presenting to the emergency department for treatment and evaluation of lower extremity swelling.  See HPI for further details.  Labs drawn while awaiting ER room assignment are still pending.  Plan will be to await results.  Venous image of the right lower extremity is negative for DVT.  DIFFERENTIAL DIAGNOSIS  DVT, PE, CHF  ED COURSE  Initial labs were drawn were not processed.  Labs recollected and resubmitted to the lab.  Continue to await results.  Patient is very dyspneic with any exertion.  She is unable to rest on the stretcher with head of bed at 30 degrees.  BNP is elevated to 2993.  Last draw 2 months ago BNP was under 700.  Patient states that she has gained approximately 18 pounds since her Lasix was cut in half.  She is unsure why her dosage was reduced.  Plan will be to get an EKG, order Lasix, and a chest x-ray as well as COVID-19 screening and admit her through the hospitalist service.  Patient is aware and agrees to the plan.  Dr. Damita Dunnings has agreed to admit.     ___________________________________________   FINAL CLINICAL IMPRESSION(S) / ED DIAGNOSES  Final diagnoses:  Acute on chronic congestive heart failure, unspecified heart failure type [facility])     ED Discharge Orders    None       MONCERRATH BERHE was evaluated in Emergency Department on 11/22/2019 for the symptoms described in the history of present illness. She was evaluated in the context of the global COVID-19 pandemic, which necessitated consideration that the patient might be at risk for infection with the SARS-CoV-2 virus that causes COVID-19. Institutional protocols and algorithms that pertain to the evaluation of patients at risk for COVID-19 are in a state  of rapid change based on information released by regulatory bodies including the CDC and federal and state organizations. These policies and algorithms were followed during the patient's care in the ED.   Note:  This document was  prepared using Systems analyst and may include unintentional dictation errors.   Victorino Dike, FNP 11/22/19 2258    Merlyn Lot, MD 11/22/19 2316

## 2019-11-22 NOTE — H&P (Signed)
History and Physical    Raven [name] PJK:[id number] DOB: 09/08/1934 DOA: 11/22/2019  PCP: Baxter Hire, MD   Patient coming from: Home  I have personally briefly reviewed patient's old medical records in [place]  Chief Complaint: Lower extremity edema, shortness of breath  HPI: Raven [name] is a 85 y.o. female with medical history significant for HTN, COPD on home O2 at 3 L, PAD, CAD, Crohn's disease, systolic heart failure who was sent from PCPs office for evaluation of lower extremity edema.  Patient reports progressively worsening lower extremity edema over 1 week period not responding to increased home Lasix therapy.  She also reports dyspnea on minimal exertion and inability to lie flat to sleep having to prop herself up on several pillows or sleep sitting up.  Last hospitalization was in June 2021 for worsening respiratory failure related to CHF and COPD.  EF was 40 to 45% on 08/2019 she denies increased wheezing, coughing, fever chills or chest pain. ED Course: On arrival, vitals were within normal limits, BNP elevated at close to 3000.  Chest x-ray with increased pulmonary vascular congestion.  Troponin pending at time of admission.  Covid also pending.  Hospitalist consulted for admission.  EKG showed sinus rhythm with no acute ST-T wave changes.  Review of Systems: As per HPI otherwise all other systems on review of systems negative.    Past Medical History:  Diagnosis Date  . Anemia   . Arthritis   . Asthma   . CHF (congestive heart failure) ([place])   . Complication of anesthesia   . COPD (chronic obstructive pulmonary disease) ([place])   . Crohn's disease ([place])   . Depression    after death of husband  . Hyperlipidemia   . Hypertension   . Myocardial infarction ([place])   . Osteopenia   . Peptic ulcer disease   . Pneumonia   . PONV (postoperative nausea and vomiting)     Past Surgical History:  Procedure Laterality Date  . ABDOMINAL SURGERY    . CARDIAC  CATHETERIZATION     with stent  . COLONOSCOPY WITH PROPOFOL N/A 09/27/2014   Procedure: COLONOSCOPY WITH PROPOFOL;  Surgeon: [name], MD;  Location: [facility] ENDOSCOPY;  Service: Gastroenterology;  Laterality: N/A;  . COLONOSCOPY WITH PROPOFOL N/A 06/21/2019   Procedure: COLONOSCOPY WITH PROPOFOL;  Surgeon: Lin Landsman, MD;  Location: [facility] ENDOSCOPY;  Service: Gastroenterology;  Laterality: N/A;  . EYE SURGERY     bilateral cataract surgeries  . SHOULDER SURGERY Right    x 2   . SMALL INTESTINE SURGERY       reports that she quit smoking about 47 years ago. She has never used smokeless tobacco. She reports current alcohol use. She reports that she does not use drugs.  Allergies  Allergen Reactions  . Penicillins Other (See Comments)    Reaction:  Unknown  Has patient had a PCN reaction causing immediate rash, facial/tongue/throat swelling, SOB or lightheadedness with hypotension: unknown Has patient had a PCN reaction causing severe rash involving mucus membranes or skin necrosis: unknown Has patient had a PCN reaction that required hospitalization No Has patient had a PCN reaction occurring within the last 10 years: No If all of the above answers are "NO", then may proceed with Cephalosporin use.   . Augmentin [Amoxicillin-Pot Clavulanate] Other (See Comments)    Reaction:  Unknown   . Indocin [Indomethacin] Other (See Comments)    Reaction:  Unknown   . Lodine [  Etodolac] Other (See Comments)    Reaction:   GI upset   . Methotrexate Derivatives Other (See Comments)    Reaction:  GI upset   . Gold Rash  . Zantac [Ranitidine Hcl] Rash    Family History  Problem Relation Age of Onset  . Hypertension Other       Prior to Admission medications   Medication Sig Start Date End Date Taking? Authorizing Provider  acetaminophen (TYLENOL) 500 MG tablet Take 500 mg by mouth every 6 (six) hours as needed.    [provider]  Adalimumab (HUMIRA) 40 MG/0.8ML PSKT Inject  40 mg into the skin every 14 (fourteen) days.     [provider]  albuterol (PROVENTIL HFA;VENTOLIN HFA) 108 (90 BASE) MCG/ACT inhaler Inhale 2 puffs into the lungs every 6 (six) hours as needed for wheezing or shortness of breath.    [provider]  amitriptyline (ELAVIL) 10 MG tablet Take 20 mg by mouth at bedtime.    [provider]  ANORO ELLIPTA 62.5-25 MCG/INH AEPB Inhale 1 puff into the lungs daily. 05/29/19   [provider]  aspirin EC 81 MG tablet Take 81 mg by mouth daily.    [provider]  atorvastatin (LIPITOR) 80 MG tablet Take 80 mg by mouth at bedtime.     [provider]  Calcium-Phosphorus-Vitamin D (CITRACAL +D3 PO) Take 1 tablet by mouth daily.    [provider]  clonazePAM (KLONOPIN) 0.25 MG disintegrating tablet Take 1 tablet (0.25 mg total) by mouth daily as needed (anxiety). 08/27/19   Rai, Vernelle Emerald, MD  Cobalamin Combinations (VITAMIN B12-FOLIC ACID) 502-774 MCG TABS Take by mouth daily.    [provider]  esomeprazole (NEXIUM) 40 MG capsule Take 40 mg by mouth daily. 05/29/19   [provider]  furosemide (LASIX) 40 MG tablet Take 1 tablet (40 mg total) by mouth daily. 08/27/19   Rai, Vernelle Emerald, MD  leflunomide (ARAVA) 10 MG tablet Take 10 mg by mouth daily. 03/29/19   [provider]  lisinopril (ZESTRIL) 2.5 MG tablet Take 1 tablet (2.5 mg total) by mouth daily. 08/28/19   Rai, Vernelle Emerald, MD  mesalamine (PENTASA) 500 MG CR capsule Take 1,000 mg by mouth 2 (two) times daily.    [provider]  metoprolol succinate (TOPROL-XL) 50 MG 24 hr tablet Take 50 mg by mouth daily.     [provider]  Multiple Vitamin (MULTIVITAMIN WITH MINERALS) TABS tablet Take 1 tablet by mouth daily.    [provider]  nitroGLYCERIN (NITROSTAT) 0.4 MG SL tablet Place 0.4 mg under the tongue every 5 (five) minutes as needed for chest pain.    [provider]    potassium chloride (KLOR-CON) 10 MEQ tablet Take 1 tablet (10 mEq total) by mouth daily. 08/27/19   Rai, Vernelle Emerald, MD  predniSONE (DELTASONE) 1 MG tablet Take by mouth. Take 2 tablets at bedtime    [provider]  predniSONE (DELTASONE) 5 MG tablet Take 5 mg by mouth daily with breakfast.    [provider]  risedronate (ACTONEL) 150 MG tablet Take 150 mg by mouth every 30 (thirty) days. with water on empty stomach, nothing by mouth or lie down for next 30 minutes.    [provider]  senna (SENOKOT) 8.6 MG tablet Take 2 tablets by mouth at bedtime.     [provider]  thiamine (VITAMIN B-1) 100 MG tablet Take 100 mg by mouth daily.  [provider]  vitamin E 400 UNIT capsule Take 400 Units by mouth daily.    [provider]    Physical Exam: Vitals:   11/22/19 1733 11/22/19 1734  BP: (!) 155/75   Pulse: 85   Resp: 16   Temp: 99.2 F (37.3 C)   TempSrc: Oral   SpO2: 94%   Weight:  79.8 kg  Height:  5' 6"  (1.676 m)     Vitals:   11/22/19 1733 11/22/19 1734  BP: (!) 155/75   Pulse: 85   Resp: 16   Temp: 99.2 F (37.3 C)   TempSrc: Oral   SpO2: 94%   Weight:  79.8 kg  Height:  5' 6"  (1.676 m)      Constitutional: Alert and oriented x 3 .  Mild conversational dyspnea HEENT:      Head: Normocephalic and atraumatic.         Eyes: PERLA, EOMI, Conjunctivae are normal. Sclera is non-icteric.       Mouth/Throat: Mucous membranes are moist.       Neck: Supple with no signs of meningismus. Cardiovascular: Regular rate and rhythm. No murmurs, gallops, or rubs. 2+ symmetrical distal pulses are present . No JVD. No 2-3+LE edema Respiratory: Respiratory effort increased.bibasilar rales Gastrointestinal: Soft, non tender, and non distended with positive bowel sounds. No rebound or guarding. Genitourinary: No CVA tenderness. Musculoskeletal: Nontender with normal range of motion in all extremities. No cyanosis, or erythema  of extremities. Neurologic: Normal speech and language. Face is symmetric. Moving all extremities. No gross focal neurologic deficits . Skin: Skin is warm, dry.  No rash or ulcers Psychiatric: Mood and affect are normal Speech and behavior are normal   Labs on Admission: I have personally reviewed following labs and imaging studies  CBC: Recent Labs  Lab 11/22/19 1945  WBC 7.6  HGB 12.0  HCT 36.9  MCV 95.6  PLT 650   Basic Metabolic Panel: Recent Labs  Lab 11/22/19 1945  NA 145  K 3.0*  CL 107  CO2 25  GLUCOSE 112*  BUN 24*  CREATININE 0.89  CALCIUM 9.0   GFR: Estimated Creatinine Clearance: 51 mL/min (by C-G formula based on SCr of 0.89 mg/dL). Liver Function Tests: No results for input(s): AST, ALT, ALKPHOS, BILITOT, PROT, ALBUMIN in the last 168 hours. No results for input(s): LIPASE, AMYLASE in the last 168 hours. No results for input(s): AMMONIA in the last 168 hours. Coagulation Profile: No results for input(s): INR, PROTIME in the last 168 hours. Cardiac Enzymes: No results for input(s): CKTOTAL, CKMB, CKMBINDEX, TROPONINI in the last 168 hours. BNP (last 3 results) No results for input(s): PROBNP in the last 8760 hours. HbA1C: No results for input(s): HGBA1C in the last 72 hours. CBG: No results for input(s): GLUCAP in the last 168 hours. Lipid Profile: No results for input(s): CHOL, HDL, LDLCALC, TRIG, CHOLHDL, LDLDIRECT in the last 72 hours. Thyroid Function Tests: No results for input(s): TSH, T4TOTAL, FREET4, T3FREE, THYROIDAB in the last 72 hours. Anemia Panel: No results for input(s): VITAMINB12, FOLATE, FERRITIN, TIBC, IRON, RETICCTPCT in the last 72 hours. Urine analysis:    Component Value Date/Time   COLORURINE AMBER (A) 06/17/2019 1336   APPEARANCEUR HAZY (A) 06/17/2019 1336   APPEARANCEUR Clear 06/08/2014 2052   LABSPEC 1.020 06/17/2019 1336   LABSPEC 1.015 06/08/2014 2052   PHURINE 5.0 06/17/2019 1336   GLUCOSEU NEGATIVE 06/17/2019  1336   GLUCOSEU Negative 06/08/2014 2052   HGBUR NEGATIVE 06/17/2019 1336  BILIRUBINUR NEGATIVE 06/17/2019 1336   BILIRUBINUR Negative 06/08/2014 2052   KETONESUR NEGATIVE 06/17/2019 1336   PROTEINUR 100 (A) 06/17/2019 1336   NITRITE NEGATIVE 06/17/2019 1336   LEUKOCYTESUR SMALL (A) 06/17/2019 1336   LEUKOCYTESUR Negative 06/08/2014 2052    Radiological Exams on Admission: DG Chest 2 View  Result Date: 11/22/2019 CLINICAL DATA:  Pain and swelling two right lower extremity for 2 days. EXAM: CHEST - 2 VIEW COMPARISON:  10/21/2018 FINDINGS: Mild cardiac enlargement. Aortic atherosclerosis. Elevated right hemidiaphragm, chronic. Diffuse pulmonary vascular congestion. Bibasilar atelectasis versus scarring. IMPRESSION: Cardiac enlargement and diffuse pulmonary vascular congestion. Electronically Signed   By: Kerby Moors M.D.   On: 11/22/2019 21:20   US Venous Img Lower Unilateral Right  Result Date: 11/22/2019 CLINICAL DATA:  Right lower extremity swelling for 2 days EXAM: RIGHT LOWER EXTREMITY VENOUS DOPPLER ULTRASOUND TECHNIQUE: Gray-scale sonography with compression, as well as color and duplex ultrasound, were performed to evaluate the deep venous system(s) from the level of the common femoral vein through the popliteal and proximal calf veins. COMPARISON:  Contralateral Doppler ultrasound 03/01/2016 FINDINGS: VENOUS Normal compressibility of the common femoral, superficial femoral, and popliteal veins, as well as the visualized calf veins. Visualized portions of profunda femoral vein and great saphenous vein unremarkable. No filling defects to suggest DVT on grayscale or color Doppler imaging. Doppler waveforms show normal direction of venous flow, normal respiratory plasticity and response to augmentation. Limited views of the contralateral common femoral vein are unremarkable. OTHER None. Limitations: none IMPRESSION: No femoropopliteal DVT nor evidence of DVT within the visualized calf  veins. If clinical symptoms are inconsistent or if there are persistent or worsening symptoms, further imaging (possibly involving the iliac veins) may be warranted. Electronically Signed   By: Lovena Le M.D.   On: 11/22/2019 18:40    EKG: Independently reviewed. Interpretation : Normal sinus rhythm with no acute ST-T wave changes  Assessment/Plan 85 year old female medical history significant for HTN, COPD on home O2 at 3 L, PAD, CAD, Crohn's disease, systolic heart failure who was sent from PCPs office for evaluation of lower extremity edema    Acute on chronic systolic CHF (congestive heart failure) ([place]) -Patient presents with 18 pound weight gain in the past week, bilateral lower extremity edema, elevated BNP of 3000 with chest x-ray showing increased pulmonary vascular congestion -IV Lasix -Continue home lisinopril and metoprolol -Echocardiogram June 2021 with EF 40 to 45%.  Will not repeat at this time -Low-sodium diet, intake and output monitoring and fluid restriction -Get baseline troponin and trend to evaluate for ACS    COPD (chronic obstructive pulmonary disease) (HCC) Chronic respiratory failure -Not acutely exacerbated -Continue home inhalers -Continue home oxygen    Depression -Continue home meds    CAD (coronary artery disease) -Continue antiplatelets, statins and beta-blockers    HTN (hypertension) -Continue lisinopril and metoprolol  Hypokalemia -Oral repletion  DVT prophylaxis: Lovenox  Code Status: full code  Family Communication:  none  Disposition Plan: Back to previous home environment Consults called: none  Status:At the time of admission, it appears that the appropriate admission status for this patient is INPATIENT. This is judged to be reasonable and necessary in order to provide the required intensity of service to ensure the patient's safety given the presenting symptoms, physical exam findings, and initial radiographic and laboratory data in  the context of their  Comorbid conditions.   Patient requires inpatient status due to high intensity of service, high risk for further deterioration and  high frequency of surveillance required.   I certify that at the point of admission it is my clinical judgment that the patient will require inpatient hospital care spanning beyond [place] MD [facility]     11/22/2019, 9:35 PM

## 2019-11-23 DIAGNOSIS — I10 Essential (primary) hypertension: Secondary | ICD-10-CM

## 2019-11-23 DIAGNOSIS — M069 Rheumatoid arthritis, unspecified: Secondary | ICD-10-CM | POA: Insufficient documentation

## 2019-11-23 DIAGNOSIS — E785 Hyperlipidemia, unspecified: Secondary | ICD-10-CM

## 2019-11-23 DIAGNOSIS — J449 Chronic obstructive pulmonary disease, unspecified: Secondary | ICD-10-CM

## 2019-11-23 LAB — BASIC METABOLIC PANEL
Anion gap: 13 (ref 5–15)
BUN: 22 mg/dL (ref 8–23)
CO2: 29 mmol/L (ref 22–32)
Calcium: 8.6 mg/dL — ABNORMAL LOW (ref 8.9–10.3)
Chloride: 104 mmol/L (ref 98–111)
Creatinine, Ser: 0.84 mg/dL (ref 0.44–1.00)
GFR calc Af Amer: >60 mL/min (ref >60)
GFR calc non Af Amer: >60 mL/min (ref >60)
Glucose, Bld: 96 mg/dL (ref 70–99)
Potassium: 3 mmol/L — ABNORMAL LOW (ref 3.5–5.1)
Sodium: 146 mmol/L — ABNORMAL HIGH (ref 135–145)

## 2019-11-23 MED ORDER — LEFLUNOMIDE 20 MG PO TABS
10.0000 mg | ORAL_TABLET | Freq: Every day | ORAL | Status: DC
  Administered 2019-11-23 – 2019-11-26 (×4): 10 mg via ORAL
  Filled 2019-11-23 (×5): qty 0.5

## 2019-11-23 MED ORDER — POTASSIUM CHLORIDE CRYS ER 20 MEQ PO TBCR
40.0000 meq | EXTENDED_RELEASE_TABLET | Freq: Once | ORAL | Status: AC
  Administered 2019-11-23: 40 meq via ORAL
  Filled 2019-11-23: qty 2

## 2019-11-23 MED ORDER — ASPIRIN EC 81 MG PO TBEC
81.0000 mg | DELAYED_RELEASE_TABLET | Freq: Every day | ORAL | Status: DC
  Administered 2019-11-23 – 2019-11-26 (×4): 81 mg via ORAL
  Filled 2019-11-23 (×4): qty 1

## 2019-11-23 MED ORDER — POTASSIUM CHLORIDE CRYS ER 20 MEQ PO TBCR
40.0000 meq | EXTENDED_RELEASE_TABLET | Freq: Two times a day (BID) | ORAL | Status: DC
  Administered 2019-11-23: 40 meq via ORAL
  Filled 2019-11-23: qty 2

## 2019-11-23 MED ORDER — UMECLIDINIUM-VILANTEROL 62.5-25 MCG/INH IN AEPB
1.0000 | INHALATION_SPRAY | Freq: Every day | RESPIRATORY_TRACT | Status: DC
  Administered 2019-11-23 – 2019-11-26 (×4): 1 via RESPIRATORY_TRACT
  Filled 2019-11-23: qty 14

## 2019-11-23 MED ORDER — SPIRONOLACTONE 25 MG PO TABS
12.5000 mg | ORAL_TABLET | Freq: Every day | ORAL | Status: DC
  Administered 2019-11-23 – 2019-11-26 (×4): 12.5 mg via ORAL
  Filled 2019-11-23: qty 1
  Filled 2019-11-23 (×2): qty 0.5
  Filled 2019-11-23 (×2): qty 1
  Filled 2019-11-23 (×2): qty 0.5

## 2019-11-23 MED ORDER — AMITRIPTYLINE HCL 10 MG PO TABS
20.0000 mg | ORAL_TABLET | Freq: Every day | ORAL | Status: DC
  Administered 2019-11-23: 10 mg via ORAL
  Administered 2019-11-24 – 2019-11-25 (×2): 20 mg via ORAL
  Filled 2019-11-23 (×5): qty 2

## 2019-11-23 MED ORDER — ADULT MULTIVITAMIN W/MINERALS CH
1.0000 | ORAL_TABLET | Freq: Every day | ORAL | Status: DC
  Administered 2019-11-24 – 2019-11-25 (×2): 1 via ORAL
  Filled 2019-11-23 (×4): qty 1

## 2019-11-23 MED ORDER — ALBUTEROL SULFATE (2.5 MG/3ML) 0.083% IN NEBU: 3.0000 mL | INHALATION_SOLUTION | Freq: Four times a day (QID) | RESPIRATORY_TRACT | Status: DC | PRN

## 2019-11-23 MED ORDER — PREDNISONE 10 MG PO TABS
5.0000 mg | ORAL_TABLET | Freq: Every day | ORAL | Status: DC
  Administered 2019-11-24 – 2019-11-26 (×3): 5 mg via ORAL
  Filled 2019-11-23 (×3): qty 1

## 2019-11-23 MED ORDER — THIAMINE HCL 100 MG PO TABS
100.0000 mg | ORAL_TABLET | Freq: Every day | ORAL | Status: DC
  Administered 2019-11-24 – 2019-11-25 (×2): 100 mg via ORAL
  Filled 2019-11-23 (×4): qty 1

## 2019-11-23 MED ORDER — PANTOPRAZOLE SODIUM 40 MG PO TBEC
40.0000 mg | DELAYED_RELEASE_TABLET | Freq: Every day | ORAL | Status: DC
  Administered 2019-11-23 – 2019-11-26 (×4): 40 mg via ORAL
  Filled 2019-11-23 (×4): qty 1

## 2019-11-23 MED ORDER — MESALAMINE ER 250 MG PO CPCR
1000.0000 mg | ORAL_CAPSULE | Freq: Two times a day (BID) | ORAL | Status: DC
  Administered 2019-11-23 – 2019-11-26 (×4): 1000 mg via ORAL
  Filled 2019-11-23 (×8): qty 4

## 2019-11-23 MED ORDER — PREDNISONE 1 MG PO TABS
2.0000 mg | ORAL_TABLET | Freq: Every day | ORAL | Status: DC
  Administered 2019-11-23 – 2019-11-25 (×3): 2 mg via ORAL
  Filled 2019-11-23 (×4): qty 2

## 2019-11-23 MED ORDER — CLONAZEPAM 0.25 MG PO TBDP: 0.2500 mg | ORAL_TABLET | Freq: Every day | ORAL | Status: DC | PRN

## 2019-11-23 NOTE — ED Notes (Signed)
Called report to [facility] on 2A

## 2019-11-23 NOTE — ED Notes (Signed)
This RN will give meds once verified by pharm. Previously notified pharm meds need to be verified.

## 2019-11-23 NOTE — ED Notes (Signed)
Pt's purewick failed. Pt switched to hospital bed, clean linens, clean brief, new purewick, new yellow socks. Pt became sob when transferring, but recovered quickly upon resting.

## 2019-11-23 NOTE — TOC Initial Note (Signed)
Transition of Care [facility] - Initial/Assessment Note    Patient Details  Name: Raven [name] MRN: [id number] Date of Birth: 01/14/1935  [facility] of [facility]) CM/SW Contact:    Anselm Pancoast, RN Phone Number: 11/23/2019, 10:27 AM  Clinical Narrative:                 Spoke with patient at bedside. Patient reports she lives alone and is independent with all her needs. Patient is able to drive herself to appointments as needed including grocery store and pharmacy. Has strong family support who all call daily and visit daily. Patient has life alert and cell phone that she typically has on person at all times. Patient open to possible home health if needed however at this time does not feel as though it is needed. Reports fall a couple of months prior that resulted in arm fracture after tripping over garden hose while watering flowers. Reports no concerns or repeated falls since accident. Patient has completed both COVID vaccines. No current needs at this time.  Expected Discharge Plan: Home/Self Care Barriers to Discharge: Continued Medical Work up   Patient Goals and CMS Choice Patient states their goals for this hospitalization and ongoing recovery are:: Return home      Expected Discharge Plan and Services Expected Discharge Plan: Home/Self Care       Living arrangements for the past 2 months: Single Family Home                                      Prior Living Arrangements/Services Living arrangements for the past 2 months: Single Family Home Lives with:: Self Patient language and need for interpreter reviewed:: Yes Do you feel safe going back to the place where you live?: Yes        Care giver support system in place?: Yes (comment)   Criminal Activity/Legal Involvement Pertinent to Current Situation/Hospitalization: No - Comment as needed  Activities of Daily Living      Permission Sought/Granted Permission sought to share information with : Family  Supports Permission granted to share information with : Yes, Verbal Permission Granted              Emotional Assessment Appearance:: Well-Groomed, Appears younger than stated age Attitude/Demeanor/Rapport: Ambitious, Engaged, Self-Confident Affect (typically observed): Accepting, Pleasant, Appropriate Orientation: : Oriented to Self, Oriented to Place, Oriented to  Time, Oriented to Situation Alcohol / Substance Use: Not Applicable Psych Involvement: No (comment)  Admission diagnosis:  Acute on chronic systolic (congestive) heart failure (HCC) [I50.23] Patient Active Problem List   Diagnosis Date Noted  . Acute on chronic systolic CHF (congestive heart failure) ([place]) 11/22/2019  . Acute on chronic systolic (congestive) heart failure ([place]) 11/22/2019  . Chronic respiratory failure with hypoxia ([place]) 11/22/2019  . HLD (hyperlipidemia) 08/25/2019  . HTN (hypertension) 08/25/2019  . Depression   . CAD (coronary artery disease)   . Elevated troponin   . Hypokalemia   . Crohn's disease ([place])   . Acute on chronic respiratory failure with hypoxia ([place])   . Steroid-dependent COPD ([place])   . SBO (small bowel obstruction) ([place]) 06/17/2019  . Crohn's disease of small intestine with intestinal obstruction ([place])   . COPD exacerbation ([place]) 10/20/2018  . COPD (chronic obstructive pulmonary disease) ([place]) 04/22/2016  . Acute bronchitis 04/17/2016  . Sepsis ([place]) 04/17/2016  . Peroneal tendonitis 07/18/2015  . Pneumonia 04/19/2015  .  CAP (community acquired pneumonia) 11/02/2014   PCP:  Baxter Hire, MD Pharmacy:   [place], [place] - [place] [place] [address] [place] [address] Phone: [phone] Fax: [phone]  [place], [place] - [place] [address] Phone: [phone] Fax: [phone]     Social Determinants of Health (SDOH) Interventions    Readmission Risk Interventions No  flowsheet data found.

## 2019-11-23 NOTE — ED Notes (Signed)
Pt does have meal tray at bedside, but doesn't want to eat at the moment

## 2019-11-23 NOTE — Progress Notes (Signed)
Patient ID: [name], female   DOB: Aug 01, 1934, 85 y.o.   MRN: [id number] [facility] PROGRESS NOTE  LERLINE VALDIVIA JME:[id number] DOB: 10/17/1934 DOA: 11/22/2019 PCP: Baxter Hire, MD  HPI/Subjective: Patient coming in with weight gain which she says at least 7 pounds.  Coming in with leg swelling can hardly bend her leg.  Patient has some shortness of breath.  Admitted with heart failure exacerbation.  Objective: Vitals:   11/23/19 0853 11/23/19 0935  BP:  (!) 146/95  Pulse:  83  Resp:  15  Temp: (!) 97.5 F (36.4 C)   SpO2:  97%    Intake/Output Summary (Last 24 hours) at 11/23/2019 1545 Last data filed at 11/23/2019 0847 Gross per 24 hour  Intake --  Output 1550 ml  Net -1550 ml   Filed Weights   11/22/19 1734  Weight: 79.8 kg    ROS: Review of Systems  Respiratory: Positive for cough and shortness of breath.   Cardiovascular: Negative for chest pain.  Gastrointestinal: Negative for abdominal pain, nausea and vomiting.   Exam: Physical Exam HENT:     Head: Normocephalic.     Nose: No mucosal edema.     Mouth/Throat:     Pharynx: No oropharyngeal exudate.  Eyes:     General: Lids are normal.     Conjunctiva/sclera: Conjunctivae normal.  Cardiovascular:     Rate and Rhythm: Normal rate and regular rhythm.     Heart sounds: Normal heart sounds, S1 normal and S2 normal.  Pulmonary:     Breath sounds: Examination of the right-lower field reveals decreased breath sounds and rales. Examination of the left-lower field reveals decreased breath sounds and rales. Decreased breath sounds and rales present. No rhonchi.  Abdominal:     Palpations: Abdomen is soft.     Tenderness: There is no abdominal tenderness.  Musculoskeletal:     Right ankle: Swelling present.     Left ankle: Swelling present.  Skin:    General: Skin is warm.     Findings: No rash.  Neurological:     Mental Status: She is alert and oriented to person, place, and time.        Data Reviewed: Basic Metabolic Panel: Recent Labs  Lab 11/22/19 1945 11/23/19 0633  NA 145 146*  K 3.0* 3.0*  CL 107 104  CO2 25 29  GLUCOSE 112* 96  BUN 24* 22  CREATININE 0.89 0.84  CALCIUM 9.0 8.6*   CBC: Recent Labs  Lab 11/22/19 1945  WBC 7.6  HGB 12.0  HCT 36.9  MCV 95.6  PLT 216   BNP (last 3 results) Recent Labs    08/25/19 1041 11/22/19 1945  BNP 635.0* 2,993.8*     Recent Results (from the past 240 hour(s))  SARS Coronavirus 2 by RT PCR (hospital order, performed in [facility] hospital lab) Nasopharyngeal Nasopharyngeal Swab     Status: None   Collection Time: 11/22/19  7:45 PM   Specimen: Nasopharyngeal Swab  Result Value Ref Range Status   SARS Coronavirus 2 NEGATIVE NEGATIVE Final    Comment: (NOTE) SARS-CoV-2 target nucleic acids are NOT DETECTED.  The SARS-CoV-2 RNA is generally detectable in upper and lower respiratory specimens during the acute phase of infection. The lowest concentration of SARS-CoV-2 viral copies this assay can detect is 250 copies / mL. A negative result does not preclude SARS-CoV-2 infection and should not be used as the sole basis for treatment or other patient management decisions.  A negative result may occur with improper specimen collection / handling, submission of specimen other than nasopharyngeal swab, presence of viral mutation(s) within the areas targeted by this assay, and inadequate number of viral copies (<250 copies / mL). A negative result must be combined with clinical observations, patient history, and epidemiological information.  Fact Sheet for Patients:   StrictlyIdeas.no  Fact Sheet for Healthcare Providers: BankingDealers.co.za  This test is not yet approved or  cleared by the [place] FDA and has been authorized for detection and/or diagnosis of SARS-CoV-2 by FDA under an Emergency Use Authorization (EUA).  This EUA will  remain in effect (meaning this test can be used) for the duration of the COVID-19 declaration under Section 564(b)(1) of the Act, 21 U.S.C. section 360bbb-3(b)(1), unless the authorization is terminated or revoked sooner.  Performed at [facility], [address] [place], [place] [address]      Studies: DG Chest 2 View  Result Date: 11/22/2019 CLINICAL DATA:  Pain and swelling two right lower extremity for 2 days. EXAM: CHEST - 2 VIEW COMPARISON:  10/21/2018 FINDINGS: Mild cardiac enlargement. Aortic atherosclerosis. Elevated right hemidiaphragm, chronic. Diffuse pulmonary vascular congestion. Bibasilar atelectasis versus scarring. IMPRESSION: Cardiac enlargement and diffuse pulmonary vascular congestion. Electronically Signed   By: Kerby Moors M.D.   On: 11/22/2019 21:20   US Venous Img Lower Unilateral Right  Result Date: 11/22/2019 CLINICAL DATA:  Right lower extremity swelling for 2 days EXAM: RIGHT LOWER EXTREMITY VENOUS DOPPLER ULTRASOUND TECHNIQUE: Gray-scale sonography with compression, as well as color and duplex ultrasound, were performed to evaluate the deep venous system(s) from the level of the common femoral vein through the popliteal and proximal calf veins. COMPARISON:  Contralateral Doppler ultrasound 03/01/2016 FINDINGS: VENOUS Normal compressibility of the common femoral, superficial femoral, and popliteal veins, as well as the visualized calf veins. Visualized portions of profunda femoral vein and great saphenous vein unremarkable. No filling defects to suggest DVT on grayscale or color Doppler imaging. Doppler waveforms show normal direction of venous flow, normal respiratory plasticity and response to augmentation. Limited views of the contralateral common femoral vein are unremarkable. OTHER None. Limitations: none IMPRESSION: No femoropopliteal DVT nor evidence of DVT within the visualized calf veins. If clinical symptoms are inconsistent or if there are  persistent or worsening symptoms, further imaging (possibly involving the iliac veins) may be warranted. Electronically Signed   By: Lovena Le M.D.   On: 11/22/2019 18:40    Scheduled Meds: . amitriptyline  20 mg Oral QHS  . aspirin EC  81 mg Oral Daily  . atorvastatin  80 mg Oral q1800  . enoxaparin (LOVENOX) injection  40 mg Subcutaneous Q24H  . furosemide  80 mg Intravenous BID  . leflunomide  10 mg Oral Daily  . lisinopril  2.5 mg Oral Daily  . mesalamine  1,000 mg Oral BID  . metoprolol succinate  50 mg Oral Daily  . multivitamin with minerals  1 tablet Oral Daily  . pantoprazole  40 mg Oral Daily  . potassium chloride  40 mEq Oral BID  . predniSONE  2 mg Oral QHS  . [START ON 11/24/2019] predniSONE  5 mg Oral Q breakfast  . sodium chloride flush  3 mL Intravenous Q12H  . spironolactone  12.5 mg Oral Daily  . [START ON 11/24/2019] thiamine  100 mg Oral Daily  . umeclidinium-vilanterol  1 puff Inhalation Daily   Continuous Infusions: . sodium chloride      Assessment/Plan:  1.  Acute on chronic systolic congestive heart failure.  Patient with weight gain and swelling of the lower extremities and shortness of breath.  Patient was placed on Lasix 80 mg IV twice daily.  Patient already on Toprol-XL and low-dose lisinopril.  Add low-dose Aldactone.  Consider Wilder Glade.  TED hose.  Cardiology can consider switching over to [facility] as outpatient since the patient is already on lisinopril which we need a 3-day washout. 2. COPD unspecified.  Continue inhalers. 3. Rheumatoid arthritis on chronic prednisone, leflunomide 4. Hyperlipidemia unspecified on atorvastatin 5. Essential hypertension on lisinopril and Toprol. 6. Weakness we will get physical therapy evaluation    Code Status:     Code Status Orders  (From admission, onward)         Start     Ordered   11/22/19 2132  Full code  Continuous        11/22/19 2134        Code Status History    Date Active Date Inactive  Code Status Order ID Comments User Context   08/25/2019 2102 08/27/2019 1931 Partial Code [id number]  Ivor Costa, MD Inpatient   08/25/2019 1729 08/25/2019 2102 Partial Code [id number]  Ivor Costa, MD ED   06/17/2019 1809 06/21/2019 2316 DNR [id number]  Fritzi Mandes, MD ED   10/20/2018 1500 10/23/2018 1317 DNR [id number]  Hillary Bow, MD ED   04/22/2016 0945 04/24/2016 1909 Full Code [id number]  Bettey Costa, MD Inpatient   04/22/2016 0932 04/22/2016 0945 DNR [id number]  Bettey Costa, MD Inpatient   04/17/2016 1853 04/20/2016 1352 Full Code [id number]  Vaughan Basta, MD Inpatient   04/19/2015 2024 04/21/2015 1714 Full Code [id number]  Dustin Flock, MD Inpatient   11/02/2014 1202 11/08/2014 1741 Full Code [id number]  Bettey Costa, MD Inpatient   Advance Care Planning Activity    Advance Directive Documentation     Most Recent Value  Type of Advance Directive Healthcare Power of [place], Living will  Pre-existing out of facility DNR order (yellow form or pink MOST form) --  "MOST" Form in Place? --     Family Communication: Spoke with daughter on the phone Disposition Plan: Status is: Inpatient  Dispo: The patient is from: Home              Anticipated d/c is to: Home              Anticipated d/c date is: Likely will need a few days of diuresis prior to disposition              Patient currently being treated for acute systolic congestive heart failure.  Consultants:  Cardiology  Time spent: 28 minutes  [place]

## 2019-11-23 NOTE — ED Notes (Signed)
Pharm states they will send 2nd 62m dose of amitriptyline to complete 285mdose to room 235.

## 2019-11-23 NOTE — Consult Note (Signed)
CARDIOLOGY CONSULT NOTE               Patient ID: Raven [name] MRN: [id number] DOB/AGE: 03-28-1934 85 y.o.  Admit date: 11/22/2019 Referring Physician Dr. Leslye Peer  Primary Physician Dr. Harrel Lemon  Primary Cardiologist Dr. Saralyn Pilar  Reason for Consultation CHF  HPI: Ms. Raven [name] is an 85 year old female with a past medical history significant for coronary artery disease with history of NSTEMI s/p PCI with a Promus stent to the OM1 on 08/10/13 at [facility] [place], chronic HFpEF, oxygen dependent COPD on 3L, RA, hyperlipidemia, and hypertension who presented to the ED on 11/22/19 for a few day history of right lower extremity swelling with associated shortness of breath and weight gain. Workup in the ED was significant for BNP of 2993, potassium of 3.0, venous US negative for a DVT, chest xray revealing diffuse pulmonary vascular congestion, and ECG revealing NSR with occasional PACs.   She is followed in outpatient cardiology by Dr. Saralyn Pilar.  Most recent echocardiogram through [facility] on 08/26/19 revealed normal RV systolic function with mildly reduced LV systolic function, an EF estimated between 40-45% with mildly elevated pulmonary pressures, moderate MR. Most recent stress test in 2017 revealed hypokinesis of the anterior, apical, and inferior walls with a moderate perfusion abnormality in the lateral region on stress images.   11/23/19: [name] is currently sitting up in bed, eating breakfast, in no acute distress.  She reports significant improvement in shortness of breath, orthopnea, and lower extremity swelling. She admits to mild chest discomfort when taking a deep breath in, but denies any recent exertional chest pain or chest pressure.  She also denies palpitations, heart racing, or syncopal/presyncopal episodes.    Review of systems complete and found to be negative unless listed above     Past Medical History:  Diagnosis Date  . Anemia   . Arthritis   . Asthma     . CHF (congestive heart failure) ([place])   . Complication of anesthesia   . COPD (chronic obstructive pulmonary disease) ([place])   . Crohn's disease ([place])   . Depression    after death of husband  . Hyperlipidemia   . Hypertension   . Myocardial infarction ([place])   . Osteopenia   . Peptic ulcer disease   . Pneumonia   . PONV (postoperative nausea and vomiting)     Past Surgical History:  Procedure Laterality Date  . ABDOMINAL SURGERY    . CARDIAC CATHETERIZATION     with stent  . COLONOSCOPY WITH PROPOFOL N/A 09/27/2014   Procedure: COLONOSCOPY WITH PROPOFOL;  Surgeon: [name], MD;  Location: [facility] ENDOSCOPY;  Service: Gastroenterology;  Laterality: N/A;  . COLONOSCOPY WITH PROPOFOL N/A 06/21/2019   Procedure: COLONOSCOPY WITH PROPOFOL;  Surgeon: Lin Landsman, MD;  Location: [facility] ENDOSCOPY;  Service: Gastroenterology;  Laterality: N/A;  . EYE SURGERY     bilateral cataract surgeries  . SHOULDER SURGERY Right    x 2   . SMALL INTESTINE SURGERY      (Not in a hospital admission)  Social History   Socioeconomic History  . Marital status: Widowed    Spouse name: Not on file  . Number of children: Not on file  . Years of education: Not on file  . Highest education level: Not on file  Occupational History  . Not on file  Tobacco Use  . Smoking status: Former Smoker    Quit date: 1974    Years since  quitting: 47.7  . Smokeless tobacco: Never Used  Vaping Use  . Vaping Use: Never used  Substance and Sexual Activity  . Alcohol use: Yes    Comment: occassional  . Drug use: No  . Sexual activity: Never    Birth control/protection: Abstinence  Other Topics Concern  . Not on file  Social History Narrative  . Not on file   Social Determinants of Health   Financial Resource Strain:   . Difficulty of Paying Living Expenses: Not on file  Food Insecurity:   . Worried About Charity fundraiser in the Last Year: Not on file  . Ran Out of Food in the Last Year: Not on  file  Transportation Needs:   . Lack of Transportation (Medical): Not on file  . Lack of Transportation (Non-Medical): Not on file  Physical Activity:   . Days of Exercise per Week: Not on file  . Minutes of Exercise per Session: Not on file  Stress:   . Feeling of Stress : Not on file  Social Connections:   . Frequency of Communication with Friends and Family: Not on file  . Frequency of Social Gatherings with Friends and Family: Not on file  . Attends Religious Services: Not on file  . Active Member of Clubs or Organizations: Not on file  . Attends Archivist Meetings: Not on file  . Marital Status: Not on file  Intimate Partner Violence:   . Fear of Current or Ex-Partner: Not on file  . Emotionally Abused: Not on file  . Physically Abused: Not on file  . Sexually Abused: Not on file    Family History  Problem Relation Age of Onset  . Hypertension Other       Review of systems complete and found to be negative unless listed above      PHYSICAL EXAM  General: Well developed, well nourished, in no acute distress HEENT:  Normocephalic and atramatic Neck:  No JVD.  Lungs: On supplemental O2. Clear bilaterally to auscultation and percussion. Heart: HRRR . Normal S1 and S2 without gallops or murmurs.  Abdomen: Bowel sounds are positive, abdomen soft and non-tender  Msk:  Back normal.  Normal strength and tone for age. Extremities: No clubbing, cyanosis or edema.   Neuro: Alert and oriented X 3. Psych:  Good affect, responds appropriately  Labs:   Lab Results  Component Value Date   WBC 7.6 11/22/2019   HGB 12.0 11/22/2019   HCT 36.9 11/22/2019   MCV 95.6 11/22/2019   PLT 216 11/22/2019    Recent Labs  Lab 11/23/19 0633  NA 146*  K 3.0*  CL 104  CO2 29  BUN 22  CREATININE 0.84  CALCIUM 8.6*  GLUCOSE 96   Lab Results  Component Value Date   CKTOTAL 266 (H) 11/08/2014   TROPONINI 0.03 (HH) 04/22/2016    Lab Results  Component Value Date    CHOL 131 08/26/2019   CHOL 129 06/09/2014   Lab Results  Component Value Date   HDL 53 08/26/2019   HDL 52 06/09/2014   Lab Results  Component Value Date   LDLCALC 62 08/26/2019   LDLCALC 60 06/09/2014   Lab Results  Component Value Date   TRIG 78 08/26/2019   TRIG 87 06/09/2014   Lab Results  Component Value Date   CHOLHDL 2.5 08/26/2019   No results found for: LDLDIRECT    Radiology: DG Chest 2 View  Result Date: 11/22/2019 CLINICAL DATA:  Pain and swelling two right lower extremity for 2 days. EXAM: CHEST - 2 VIEW COMPARISON:  10/21/2018 FINDINGS: Mild cardiac enlargement. Aortic atherosclerosis. Elevated right hemidiaphragm, chronic. Diffuse pulmonary vascular congestion. Bibasilar atelectasis versus scarring. IMPRESSION: Cardiac enlargement and diffuse pulmonary vascular congestion. Electronically Signed   By: Kerby Moors M.D.   On: 11/22/2019 21:20   US Venous Img Lower Unilateral Right  Result Date: 11/22/2019 CLINICAL DATA:  Right lower extremity swelling for 2 days EXAM: RIGHT LOWER EXTREMITY VENOUS DOPPLER ULTRASOUND TECHNIQUE: Gray-scale sonography with compression, as well as color and duplex ultrasound, were performed to evaluate the deep venous system(s) from the level of the common femoral vein through the popliteal and proximal calf veins. COMPARISON:  Contralateral Doppler ultrasound 03/01/2016 FINDINGS: VENOUS Normal compressibility of the common femoral, superficial femoral, and popliteal veins, as well as the visualized calf veins. Visualized portions of profunda femoral vein and great saphenous vein unremarkable. No filling defects to suggest DVT on grayscale or color Doppler imaging. Doppler waveforms show normal direction of venous flow, normal respiratory plasticity and response to augmentation. Limited views of the contralateral common femoral vein are unremarkable. OTHER None. Limitations: none IMPRESSION: No femoropopliteal DVT nor evidence of DVT within  the visualized calf veins. If clinical symptoms are inconsistent or if there are persistent or worsening symptoms, further imaging (possibly involving the iliac veins) may be warranted. Electronically Signed   By: Lovena Le M.D.   On: 11/22/2019 18:40    EKG: Normal sinus rhythm, PACs, rate of 85bpm   ASSESSMENT AND PLAN:  1.  Acute on chronic HFrEF   -With recent 18 pound weight gain on 24m Lasix po daily   -Will continue with IV Lasix 872mBID for now and transition to oral when appropriate; continue close monitoring of potassium, renal function   -Discussed the importance of sodium and fluid restriction   2.  History of coronary artery disease   -History of PCI in 2015 with no recent ischemic symptoms   -Continue aspirin 9145maily, atorvastatin 34m60mily, lisinopril 2.5mg 58mly, and metoprolol 50mg 76my   3.  Hypokalemia   -Continue to monitor and replete as needed   4.  COPD   -Oxygen dependent; continue current medications   The history, physical exam findings, and plan of care were all discussed with Dr. KennetBartholome Billall decision making was made in collaboration.   Signed: NicoleAvie Arenas9/16/2021, 8:53 AM

## 2019-11-23 NOTE — ED Notes (Signed)
Sent a message to pharmacy and [place] RN called as well about missing dose

## 2019-11-24 ENCOUNTER — Encounter: Payer: Self-pay | Admitting: Internal Medicine

## 2019-11-24 DIAGNOSIS — R531 Weakness: Secondary | ICD-10-CM

## 2019-11-24 DIAGNOSIS — M79605 Pain in left leg: Secondary | ICD-10-CM

## 2019-11-24 DIAGNOSIS — E876 Hypokalemia: Secondary | ICD-10-CM

## 2019-11-24 DIAGNOSIS — M79604 Pain in right leg: Secondary | ICD-10-CM

## 2019-11-24 LAB — BASIC METABOLIC PANEL
Anion gap: 14 (ref 5–15)
BUN: 20 mg/dL (ref 8–23)
CO2: 28 mmol/L (ref 22–32)
Calcium: 7.9 mg/dL — ABNORMAL LOW (ref 8.9–10.3)
Chloride: 100 mmol/L (ref 98–111)
Creatinine, Ser: 0.96 mg/dL (ref 0.44–1.00)
GFR calc Af Amer: >60 mL/min (ref >60)
GFR calc non Af Amer: 55 mL/min — ABNORMAL LOW (ref >60)
Glucose, Bld: 106 mg/dL — ABNORMAL HIGH (ref 70–99)
Potassium: 3.3 mmol/L — ABNORMAL LOW (ref 3.5–5.1)
Sodium: 142 mmol/L (ref 135–145)

## 2019-11-24 LAB — MAGNESIUM: Magnesium: 1.5 mg/dL — ABNORMAL LOW (ref 1.7–2.4)

## 2019-11-24 LAB — CK: Total CK: 71 U/L (ref 38–234)

## 2019-11-24 MED ORDER — POTASSIUM CHLORIDE CRYS ER 20 MEQ PO TBCR
40.0000 meq | EXTENDED_RELEASE_TABLET | Freq: Three times a day (TID) | ORAL | Status: DC
  Administered 2019-11-24 – 2019-11-25 (×4): 40 meq via ORAL
  Filled 2019-11-24 (×4): qty 2

## 2019-11-24 MED ORDER — MAGNESIUM SULFATE 2 GM/50ML IV SOLN
2.0000 g | Freq: Once | INTRAVENOUS | Status: AC
  Administered 2019-11-24: 2 g via INTRAVENOUS
  Filled 2019-11-24: qty 50

## 2019-11-24 MED ORDER — INFLUENZA VAC A&B SA ADJ QUAD 0.5 ML IM PRSY
0.5000 mL | PREFILLED_SYRINGE | INTRAMUSCULAR | Status: AC
  Administered 2019-11-26: 0.5 mL via INTRAMUSCULAR
  Filled 2019-11-24: qty 0.5

## 2019-11-24 NOTE — Evaluation (Signed)
Physical Therapy Evaluation Patient Details Name: Raven [name] MRN: [id number] DOB: [birth year] Today's Date: 11/24/2019   History of Present Illness  Pt is an 85 year old female with a past medical history significant for coronary artery disease with history of NSTEMI s/p PCI with a Promus stent to the OM1 on 08/10/13 at [facility], chronic HFpEF, oxygen dependent COPD on 3L, RA, hyperlipidemia, and hypertension who presented to the ED on 11/22/19 for a few day history of right lower extremity swelling with associated shortness of breath and weight gain. Workup in the ED was significant for BNP of 2993, potassium of 3.0, venous US negative for a DVT, chest xray revealing diffuse pulmonary vascular congestion, and ECG revealing NSR with occasional PACs.    Clinical Impression  Pt alert, agreeable to PT. Reported at baseline she is independent, as DME as needed and a supportive family. Pt did state she had a recent hospitalization and received HHPT, did have a fall with therapy.  The patient demonstrated bed mobility modI, and sit <> stand modI. Ambulated ~80f with IV pole, decreased gait velocity and activity tolerance noted. Pt able to ambulate without UE support, but improved pt comfort and safety noted with IV pole. Pt educated on use of RW during hospital admission and as needed at home moving forward, pt agreeable.  Overall the patient demonstrated mild deficits (see "PT Problem List") that impede the patient's functional abilities, safety, and mobility and would benefit from skilled PT intervention. Recommendation is HHPT.  Of note, pt at 85% on 1L at start of session, placed on 2 then 3L for mobility to maintain spO2 >90%, RN notified of pt performance and left on 2L with RN consent. Pt stated she has been using her oxygen at home for the last several weeks during the day as needed.      Follow Up Recommendations Home health PT    Equipment Recommendations  None recommended by PT     Recommendations for Other Services       Precautions / Restrictions Precautions Precautions: Fall Precaution Comments: watch O2      Mobility  Bed Mobility Overal bed mobility: Modified Independent                Transfers Overall transfer level: Modified independent Equipment used: 1 person hand held assist                Ambulation/Gait Ambulation/Gait assistance: Min guard Gait Distance (Feet): 85 Feet Assistive device: IV Pole       General Gait Details: Pt able to ambulate without IV pole, but comfort and pt gait velocity improved with UE support. Educated on use of RW during hospital admission and potentially at home as needed  SFinancial traderRankin (Stroke Patients Only)       Balance Overall balance assessment: Needs assistance Sitting-balance support: Feet supported Sitting balance-Leahy Scale: Normal       Standing balance-Leahy Scale: Good                               Pertinent Vitals/Pain Pain Assessment: No/denies pain    Home Living Family/patient expects to be discharged to:: Private residence Living Arrangements: Alone Available Help at Discharge: Family;Available PRN/intermittently Type of Home: House Home Access: Stairs to enter Entrance Stairs-Rails: Can reach both;Right;Left Entrance Stairs-Number of Steps: 3  Home Layout: One level Home Equipment: Cane - single point;Walker - 2 wheels;Grab bars - tub/shower Additional Comments: Patient reports she does not use any AD at home. Only uses 3L of 02 via nasal cannula. And denies any falls the last year    Prior Function Level of Independence: Independent         Comments: Per patient she is independent with all ADLs and iADLs. Reports she drives and prior to COVID was going to church/active in the community. Feels like she has lost balance/become more weaker in her legs and would like HHPT.     Hand Dominance    Dominant Hand: Right    Extremity/Trunk Assessment   Upper Extremity Assessment Upper Extremity Assessment: Overall WFL for tasks assessed    Lower Extremity Assessment Lower Extremity Assessment: Generalized weakness    Cervical / Trunk Assessment Cervical / Trunk Assessment: Normal  Communication   Communication: No difficulties  Cognition Arousal/Alertness: Awake/alert Behavior During Therapy: WFL for tasks assessed/performed Overall Cognitive Status: Within Functional Limits for tasks assessed                                        General Comments      Exercises Other Exercises Other Exercises: spO2 monitored throughout, on 3L via  for ambulation   Assessment/Plan    PT Assessment Patient needs continued PT services  PT Problem List Decreased mobility;Decreased activity tolerance;Decreased balance       PT Treatment Interventions DME instruction;Therapeutic exercise;Gait training;Balance training;Stair training;Neuromuscular re-education;Functional mobility training;Therapeutic activities;Patient/family education    PT Goals (Current goals can be found in the Care Plan section)  Acute Rehab PT Goals Patient Stated Goal: to return to PLOF PT Goal Formulation: With patient Time For Goal Achievement: 12/08/19 Potential to Achieve Goals: Good    Frequency Min 2X/week   Barriers to discharge        Co-evaluation               AM-PAC PT "6 Clicks" Mobility  Outcome Measure Help needed turning from your back to your side while in a flat bed without using bedrails?: None Help needed moving from lying on your back to sitting on the side of a flat bed without using bedrails?: None Help needed moving to and from a bed to a chair (including a wheelchair)?: None Help needed standing up from a chair using your arms (e.g., wheelchair or bedside chair)?: None Help needed to walk in hospital room?: A Little Help needed climbing 3-5 steps with  a railing? : A Little 6 Click Score: 22    End of Session Equipment Utilized During Treatment: Gait belt;Oxygen (1-3L) Activity Tolerance: Patient tolerated treatment well Patient left: in chair;with call bell/phone within reach Nurse Communication: Mobility status PT Visit Diagnosis: Other abnormalities of gait and mobility (R26.89);Muscle weakness (generalized) (M62.81)    Time: 4580-9983 PT Time Calculation (min) (ACUTE ONLY): 29 min   Charges:   PT Evaluation $PT Eval Low Complexity: 1 Low PT Treatments $Therapeutic Exercise: 8-22 mins      Lieutenant Diego PT, DPT 10:41 AM,11/24/19

## 2019-11-24 NOTE — Progress Notes (Signed)
[facility]    SUBJECTIVE: Raven [name] is an 86 year old female with a past medical history significant for coronary artery disease with history of NSTEMI s/p PCI with a Promus stent to the OM1 on 08/10/13 at [facility], chronic HFpEF, oxygen dependent COPD on 3L, RA, hyperlipidemia, and hypertension who presented to the ED on 11/22/19 for a few day history of right lower extremity swelling with associated shortness of breath and weight gain. Workup in the ED was significant for BNP of 2993, potassium of 3.0, venous US negative for a DVT, chest xray revealing diffuse pulmonary vascular congestion, and ECG revealing NSR with occasional PACs.   She is followed in outpatient cardiology by Dr. Saralyn Pilar.  Most recent echocardiogram through [facility] on 08/26/19 revealed normal RV systolic function with mildly reduced LV systolic function, an EF estimated between 40-45% with mildly elevated pulmonary pressures, moderate MR. Most recent stress test in 2017 revealed hypokinesis of the anterior, apical, and inferior walls with a moderate perfusion abnormality in the lateral region on stress images.   11/23/19: Raven [name] is currently sitting up in bed, eating breakfast, in no acute distress.  She reports significant improvement in shortness of breath, orthopnea, and lower extremity swelling. She admits to mild chest discomfort when taking a deep breath in, but denies any recent exertional chest pain or chest pressure.  She also denies palpitations, heart racing, or syncopal/presyncopal episodes.    11/24/19:  She admits to low energy, fatigue, and overall "not feeling well."  Symptoms of shortness of breath, lower extremity swelling, and orthopnea have all improved. She continues to deny chest pain, chest pressure, or palpitations.  Denies syncopal or presyncopal episodes.    Vitals:   11/24/19 0013 11/24/19 0439 11/24/19 0500 11/24/19 0744  BP: (!) 127/57 138/77  (!) 145/65  Pulse: 82 84  81  Resp:     18  Temp: 98.1 F (36.7 C) 98.5 F (36.9 C)  98.4 F (36.9 C)  TempSrc: Oral Oral  Oral  SpO2: 95% 98%  96%  Weight: 75.4 kg  75.4 kg   Height: 5' 7"  (1.702 m)        Intake/Output Summary (Last 24 hours) at 11/24/2019 0851 Last data filed at 11/24/2019 7989 Gross per 24 hour  Intake --  Output 300 ml  Net -300 ml      PHYSICAL EXAM  General: Well developed, well nourished, in no acute distress HEENT:  Normocephalic and atramatic Neck:  No JVD.  Lungs: Clear bilaterally to auscultation and percussion. Heart: HRRR . Normal S1 and S2 without gallops or murmurs.  Abdomen: Bowel sounds are positive, abdomen soft and non-tender  Msk:  Back normal, normal gait. Normal strength and tone for age. Extremities: No clubbing, cyanosis or edema.   Neuro: Alert and oriented X 3. Psych:  Good affect, responds appropriately   LABS: Basic Metabolic Panel: Recent Labs    11/23/19 0633 11/24/19 0500  NA 146* 142  K 3.0* 3.3*  CL 104 100  CO2 29 28  GLUCOSE 96 106*  BUN 22 20  CREATININE 0.84 0.96  CALCIUM 8.6* 7.9*  MG  --  1.5*   Liver Function Tests: No results for input(s): AST, ALT, ALKPHOS, BILITOT, PROT, ALBUMIN in the last 72 hours. No results for input(s): LIPASE, AMYLASE in the last 72 hours. CBC: Recent Labs    11/22/19 1945  WBC 7.6  HGB 12.0  HCT 36.9  MCV 95.6  PLT 216  Cardiac Enzymes: No results for input(s): CKTOTAL, CKMB, CKMBINDEX, TROPONINI in the last 72 hours. BNP: Invalid input(s): POCBNP D-Dimer: No results for input(s): DDIMER in the last 72 hours. Hemoglobin A1C: No results for input(s): HGBA1C in the last 72 hours. Fasting Lipid Panel: No results for input(s): CHOL, HDL, LDLCALC, TRIG, CHOLHDL, LDLDIRECT in the last 72 hours. Thyroid Function Tests: No results for input(s): TSH, T4TOTAL, T3FREE, THYROIDAB in the last 72 hours.  Invalid input(s): FREET3 Anemia Panel: No results for input(s): VITAMINB12, FOLATE, FERRITIN, TIBC,  IRON, RETICCTPCT in the last 72 hours.  DG Chest 2 View  Result Date: 11/22/2019 CLINICAL DATA:  Pain and swelling two right lower extremity for 2 days. EXAM: CHEST - 2 VIEW COMPARISON:  10/21/2018 FINDINGS: Mild cardiac enlargement. Aortic atherosclerosis. Elevated right hemidiaphragm, chronic. Diffuse pulmonary vascular congestion. Bibasilar atelectasis versus scarring. IMPRESSION: Cardiac enlargement and diffuse pulmonary vascular congestion. Electronically Signed   By: Kerby Moors M.D.   On: 11/22/2019 21:20   US Venous Img Lower Unilateral Right  Result Date: 11/22/2019 CLINICAL DATA:  Right lower extremity swelling for 2 days EXAM: RIGHT LOWER EXTREMITY VENOUS DOPPLER ULTRASOUND TECHNIQUE: Gray-scale sonography with compression, as well as color and duplex ultrasound, were performed to evaluate the deep venous system(s) from the level of the common femoral vein through the popliteal and proximal calf veins. COMPARISON:  Contralateral Doppler ultrasound 03/01/2016 FINDINGS: VENOUS Normal compressibility of the common femoral, superficial femoral, and popliteal veins, as well as the visualized calf veins. Visualized portions of profunda femoral vein and great saphenous vein unremarkable. No filling defects to suggest DVT on grayscale or color Doppler imaging. Doppler waveforms show normal direction of venous flow, normal respiratory plasticity and response to augmentation. Limited views of the contralateral common femoral vein are unremarkable. OTHER None. Limitations: none IMPRESSION: No femoropopliteal DVT nor evidence of DVT within the visualized calf veins. If clinical symptoms are inconsistent or if there are persistent or worsening symptoms, further imaging (possibly involving the iliac veins) may be warranted. Electronically Signed   By: Lovena Le M.D.   On: 11/22/2019 18:40    Echo: 08/26/19 revealed normal RV systolic function with mildly reduced LV systolic function, an EF estimated  between 40-45% with mildly elevated pulmonary pressures, moderate MR.  TELEMETRY: Normal sinus rhythm   ASSESSMENT AND PLAN:  Principal Problem:   Acute on chronic systolic CHF (congestive heart failure) (HCC) Active Problems:   COPD (chronic obstructive pulmonary disease) (HCC)   Depression   CAD (coronary artery disease)   HTN (hypertension)   Acute on chronic systolic (congestive) heart failure (HCC)   Chronic respiratory failure with hypoxia ([place])   1.  Acute on chronic HFrEF              -With recent 18 pound weight gain on 48m Lasix po daily              -Will continue with IV Lasix 842mBID for now and transition to oral when appropriate; continue close monitoring of potassium, renal function              -Discussed the importance of sodium and fluid restriction   2.  History of coronary artery disease              -History of PCI in 2015 with no recent ischemic symptoms              -Continue aspirin 9124maily, atorvastatin 76m34mily, lisinopril 2.5mg 21mly, and metoprolol 50mg26m  daily   3.  Hypokalemia              -Continue to monitor and replete potassium/magensium as needed   4.  COPD              -Oxygen dependent; continue current medications    The history, physical exam findings, and plan of care were all discussed with Dr. Bartholome Bill, and all decision making was made in collaboration.   Avie Arenas  PA-C 11/24/2019 8:51 AM

## 2019-11-24 NOTE — ED Notes (Addendum)
Pt NSR on cardiac monitor. This RN transported pt to floor.

## 2019-11-24 NOTE — Progress Notes (Signed)
Mobility Specialist - Progress Note   11/24/19 1415  Mobility  Activity Refused mobility  Mobility performed by Mobility specialist     Pt politely refused session this morning d/t "feeling tired" and "worn out". Will re-attempt session at a later date/time.   Enya Bureau Mobility Specialist  11/24/19, 2:15 PM

## 2019-11-24 NOTE — Care Management Important Message (Signed)
Important Message  Patient Details  Name: Raven [name] MRN: [id number] Date of Birth: 1934/03/19   Medicare Important Message Given:  Yes  Initial Medicare IM given by Patient Access Associate on 11/23/2019 at 8:13am.     Dannette Barbara 11/24/2019, 8:49 AM

## 2019-11-24 NOTE — Progress Notes (Signed)
  Heart Failure Nurse Navigator Note  HFrEF 40 to 45%  Initial Visit  She presented from home with lower extremity edema, increasing SOB,orthopnea and PND.   BNP 2,993 CXR revealed vascular congestion.   Comorbidities:  Anemia COPD Coronary artery disease Depression Hypertension Hyperlipidemia  Medications: Lipitor 80 mg daily Lasix 80 mg IV BID Zestril 2.5 mg daily Spironolactone 12.5 mg daily   Labs:  Sodium 142, potassium 3.3,BUN 20, creatinine 0.96, magnesium 1.5.  BNP on admission 2,993.  Weight 75.4  Kg down from 79.8 kg  Intake-0 Output- 951 ml  Spoke with patient, she states prior to admission she noted that she would gain weight then would lose and then gain again, noted the lower extremity edema and being more SOB.  When discussing her diet she admits to eating regularly at restaurants and also to eating whole cans of soup at one sitting. Has far as fluid intake she has approximately 4-12 glasses.  Discussed 2000 mg  Intake of sodium daily.  She states if she is eating potatoes or tomato sandwich she is going to add salt.  Discussed the zones of the magnet, reinforced calling doctor for 2 pound weigh gain over night  or 5 pounds in a week.  Will continue to follow.  Pricilla Riffle RN,CHFN

## 2019-11-24 NOTE — Plan of Care (Signed)
  Problem: Education: Goal: Knowledge of General Education information will improve Description: Including pain rating scale, medication(s)/side effects and non-pharmacologic comfort measures Outcome: Progressing Note: Patient profile completed. No complaints of pain. REDs reading performed. Patient is in agreement with plan of care.

## 2019-11-24 NOTE — Progress Notes (Signed)
Patient ID: Raven [name], female   DOB: [birth year], 85 y.o.   MRN: [id number] [facility] PROGRESS NOTE  LARYN VENNING NOM:[id number] DOB: 1934/06/27 DOA: 11/22/2019 PCP: Baxter Hire, MD  HPI/Subjective: Patient complains that her legs are so tender and aching and the skin is tight.  Her leg swelling is better.  Breathing a little bit better.  Came in with shortness of breath and found to have CHF exacerbation.  Patient states that she is urinating but not a lot.  Objective: Vitals:   11/24/19 0744 11/24/19 1108  BP: (!) 145/65 111/83  Pulse: 81 69  Resp: 18 17  Temp: 98.4 F (36.9 C) 97.9 F (36.6 C)  SpO2: 96% 98%    Intake/Output Summary (Last 24 hours) at 11/24/2019 1446 Last data filed at 11/24/2019 1330 Gross per 24 hour  Intake 240 ml  Output 800 ml  Net -560 ml   Filed Weights   11/22/19 1734 11/24/19 0013 11/24/19 0500  Weight: 79.8 kg 75.4 kg 75.4 kg    ROS: Review of Systems  Respiratory: Positive for cough and shortness of breath.   Cardiovascular: Negative for chest pain.  Gastrointestinal: Negative for abdominal pain, nausea and vomiting.  Musculoskeletal: Positive for myalgias.   Exam: Physical Exam HENT:     Head: Normocephalic.     Mouth/Throat:     Pharynx: No oropharyngeal exudate.  Eyes:     General: Lids are normal.     Conjunctiva/sclera: Conjunctivae normal.     Pupils: Pupils are equal, round, and reactive to light.  Cardiovascular:     Rate and Rhythm: Normal rate and regular rhythm.     Heart sounds: Normal heart sounds, S1 normal and S2 normal.  Pulmonary:     Breath sounds: Examination of the right-lower field reveals decreased breath sounds and rhonchi. Examination of the left-lower field reveals decreased breath sounds and rhonchi. Decreased breath sounds and rhonchi present. No wheezing or rales.  Abdominal:     Palpations: Abdomen is soft.     Tenderness: There is no abdominal tenderness.  Musculoskeletal:     Right  lower leg: Swelling present.     Left lower leg: Swelling present.  Skin:    General: Skin is warm.     Findings: No rash.  Neurological:     Mental Status: She is alert and oriented to person, place, and time.       Data Reviewed: Basic Metabolic Panel: Recent Labs  Lab 11/22/19 1945 11/23/19 0633 11/24/19 0500  NA 145 146* 142  K 3.0* 3.0* 3.3*  CL 107 104 100  CO2 25 29 28   GLUCOSE 112* 96 106*  BUN 24* 22 20  CREATININE 0.89 0.84 0.96  CALCIUM 9.0 8.6* 7.9*  MG  --   --  1.5*   CBC: Recent Labs  Lab 11/22/19 1945  WBC 7.6  HGB 12.0  HCT 36.9  MCV 95.6  PLT 216   BNP (last 3 results) Recent Labs    08/25/19 1041 11/22/19 1945  BNP 635.0* 2,993.8*     Recent Results (from the past 240 hour(s))  SARS Coronavirus 2 by RT PCR (hospital order, performed in [facility] hospital lab) Nasopharyngeal Nasopharyngeal Swab     Status: None   Collection Time: 11/22/19  7:45 PM   Specimen: Nasopharyngeal Swab  Result Value Ref Range Status   SARS Coronavirus 2 NEGATIVE NEGATIVE Final    Comment: (NOTE) SARS-CoV-2 target nucleic acids are NOT DETECTED.  The  SARS-CoV-2 RNA is generally detectable in upper and lower respiratory specimens during the acute phase of infection. The lowest concentration of SARS-CoV-2 viral copies this assay can detect is 250 copies / mL. A negative result does not preclude SARS-CoV-2 infection and should not be used as the sole basis for treatment or other patient management decisions.  A negative result may occur with improper specimen collection / handling, submission of specimen other than nasopharyngeal swab, presence of viral mutation(s) within the areas targeted by this assay, and inadequate number of viral copies (<250 copies / mL). A negative result must be combined with clinical observations, patient history, and epidemiological information.  Fact Sheet for Patients:   StrictlyIdeas.no  Fact Sheet  for Healthcare Providers: BankingDealers.co.za  This test is not yet approved or  cleared by the [place] FDA and has been authorized for detection and/or diagnosis of SARS-CoV-2 by FDA under an Emergency Use Authorization (EUA).  This EUA will remain in effect (meaning this test can be used) for the duration of the COVID-19 declaration under Section 564(b)(1) of the Act, 21 U.S.C. section 360bbb-3(b)(1), unless the authorization is terminated or revoked sooner.  Performed at [facility], [address] [place], [place] [address]      Studies: DG Chest 2 View  Result Date: 11/22/2019 CLINICAL DATA:  Pain and swelling two right lower extremity for 2 days. EXAM: CHEST - 2 VIEW COMPARISON:  10/21/2018 FINDINGS: Mild cardiac enlargement. Aortic atherosclerosis. Elevated right hemidiaphragm, chronic. Diffuse pulmonary vascular congestion. Bibasilar atelectasis versus scarring. IMPRESSION: Cardiac enlargement and diffuse pulmonary vascular congestion. Electronically Signed   By: Kerby Moors M.D.   On: 11/22/2019 21:20   US Venous Img Lower Unilateral Right  Result Date: 11/22/2019 CLINICAL DATA:  Right lower extremity swelling for 2 days EXAM: RIGHT LOWER EXTREMITY VENOUS DOPPLER ULTRASOUND TECHNIQUE: Gray-scale sonography with compression, as well as color and duplex ultrasound, were performed to evaluate the deep venous system(s) from the level of the common femoral vein through the popliteal and proximal calf veins. COMPARISON:  Contralateral Doppler ultrasound 03/01/2016 FINDINGS: VENOUS Normal compressibility of the common femoral, superficial femoral, and popliteal veins, as well as the visualized calf veins. Visualized portions of profunda femoral vein and great saphenous vein unremarkable. No filling defects to suggest DVT on grayscale or color Doppler imaging. Doppler waveforms show normal direction of venous flow, normal respiratory plasticity and  response to augmentation. Limited views of the contralateral common femoral vein are unremarkable. OTHER None. Limitations: none IMPRESSION: No femoropopliteal DVT nor evidence of DVT within the visualized calf veins. If clinical symptoms are inconsistent or if there are persistent or worsening symptoms, further imaging (possibly involving the iliac veins) may be warranted. Electronically Signed   By: Lovena Le M.D.   On: 11/22/2019 18:40    Scheduled Meds: . amitriptyline  20 mg Oral QHS  . aspirin EC  81 mg Oral Daily  . enoxaparin (LOVENOX) injection  40 mg Subcutaneous Q24H  . furosemide  80 mg Intravenous BID  . [START ON 11/25/2019] influenza vaccine adjuvanted  0.5 mL Intramuscular Tomorrow-1000  . leflunomide  10 mg Oral Daily  . lisinopril  2.5 mg Oral Daily  . mesalamine  1,000 mg Oral BID  . metoprolol succinate  50 mg Oral Daily  . multivitamin with minerals  1 tablet Oral Daily  . pantoprazole  40 mg Oral Daily  . potassium chloride  40 mEq Oral TID  . predniSONE  2 mg Oral QHS  .  predniSONE  5 mg Oral Q breakfast  . sodium chloride flush  3 mL Intravenous Q12H  . spironolactone  12.5 mg Oral Daily  . thiamine  100 mg Oral Daily  . umeclidinium-vilanterol  1 puff Inhalation Daily   Continuous Infusions: . sodium chloride      Assessment/Plan:  1. Acute on chronic systolic congestive heart failure.  Patient with weight gain and swelling of the lower extremities.  Patient also has shortness of breath.  Continue Lasix 80 mg IV twice a day.  Patient already on Toprol and low-dose lisinopril.  I added Aldactone yesterday.  TED hose.  Monitor urine output and kidney function closely. 2. COPD unspecified.  Continue inhalers. 3. Hypomagnesemia and hypokalemia replace magnesium 2 g IV today and oral potassium. 4. Leg pains.  Add on a CPK and give a statin holiday.  Ultrasound right lower extremity negative for DVT. 5. Hyperlipidemia unspecified.  Hold atorvastatin for right  now 6. Essential hypertension on lisinopril, Toprol and Aldactone 7. Weakness.  Physical therapy evaluation appreciated    Code Status:     Code Status Orders  (From admission, onward)         Start     Ordered   11/22/19 2132  Full code  Continuous        11/22/19 2134        Code Status History    Date Active Date Inactive Code Status Order ID Comments User Context   08/25/2019 2102 08/27/2019 1931 Partial Code [id number]  Ivor Costa, MD Inpatient   08/25/2019 1729 08/25/2019 2102 Partial Code [id number]  Ivor Costa, MD ED   06/17/2019 1809 06/21/2019 2316 DNR [id number]  Fritzi Mandes, MD ED   10/20/2018 1500 10/23/2018 1317 DNR [id number]  Hillary Bow, MD ED   04/22/2016 0945 04/24/2016 1909 Full Code [id number]  Bettey Costa, MD Inpatient   04/22/2016 0932 04/22/2016 0945 DNR [id number]  Bettey Costa, MD Inpatient   04/17/2016 1853 04/20/2016 1352 Full Code [id number]  Vaughan Basta, MD Inpatient   04/19/2015 2024 04/21/2015 1714 Full Code [id number]  Dustin Flock, MD Inpatient   11/02/2014 1202 11/08/2014 1741 Full Code [id number]  Bettey Costa, MD Inpatient   Advance Care Planning Activity    Advance Directive Documentation     Most Recent Value  Type of Advance Directive Healthcare Power of [place], Living will  Pre-existing out of facility DNR order (yellow form or pink MOST form) --  "MOST" Form in Place? --     Family Communication: Spoke with son at the bedside Disposition Plan: Status is: Inpatient  Dispo: The patient is from: Home              Anticipated d/c is to: Home              Anticipated d/c date is: May need a few more days of diuresis              Patient currently currently treating for acute systolic congestive heart failure.  Consultants:  Cardiology  Time spent: 27 minutes  [place]

## 2019-11-25 DIAGNOSIS — J9601 Acute respiratory failure with hypoxia: Secondary | ICD-10-CM

## 2019-11-25 LAB — BASIC METABOLIC PANEL
Anion gap: 12 (ref 5–15)
BUN: 25 mg/dL — ABNORMAL HIGH (ref 8–23)
CO2: 26 mmol/L (ref 22–32)
Calcium: 8.4 mg/dL — ABNORMAL LOW (ref 8.9–10.3)
Chloride: 103 mmol/L (ref 98–111)
Creatinine, Ser: 0.95 mg/dL (ref 0.44–1.00)
GFR calc Af Amer: >60 mL/min (ref >60)
GFR calc non Af Amer: 55 mL/min — ABNORMAL LOW (ref >60)
Glucose, Bld: 131 mg/dL — ABNORMAL HIGH (ref 70–99)
Potassium: 4.3 mmol/L (ref 3.5–5.1)
Sodium: 141 mmol/L (ref 135–145)

## 2019-11-25 LAB — BRAIN NATRIURETIC PEPTIDE: B Natriuretic Peptide: 967.5 pg/mL — ABNORMAL HIGH (ref 0.0–100.0)

## 2019-11-25 MED ORDER — POTASSIUM CHLORIDE CRYS ER 20 MEQ PO TBCR
40.0000 meq | EXTENDED_RELEASE_TABLET | Freq: Two times a day (BID) | ORAL | Status: DC
  Administered 2019-11-25: 40 meq via ORAL
  Filled 2019-11-25: qty 2

## 2019-11-25 MED ORDER — METOLAZONE 5 MG PO TABS
5.0000 mg | ORAL_TABLET | Freq: Once | ORAL | Status: AC
  Administered 2019-11-25: 5 mg via ORAL
  Filled 2019-11-25: qty 1

## 2019-11-25 NOTE — Progress Notes (Signed)
Patient Name: Raven [name] Date of Encounter: 11/25/2019  Hospital Problem List     Principal Problem:   Acute on chronic systolic CHF (congestive heart failure) (HCC) Active Problems:   COPD (chronic obstructive pulmonary disease) (HCC)   Depression   CAD (coronary artery disease)   HTN (hypertension)   Acute on chronic systolic (congestive) heart failure (HCC)   Chronic respiratory failure with hypoxia (HCC)   Hypomagnesemia   Pain in both lower extremities   Weakness    Patient Profile     86 year old female with a past medical history significant for coronary artery disease with history of NSTEMI s/p PCI with a Promus stent to the OM1 on 08/10/13 at [facility], chronic HFpEF, oxygen dependent COPD on 3L, RA, hyperlipidemia, and hypertension who presented to the ED on 11/22/19 for a few day history of right lower extremity swelling with associated shortness of breath and weight gain. Workup in the ED was significant for BNP of 2993, potassium of 3.0, venous US negative for a DVT, chest xray revealing diffuse pulmonary vascular congestion, and ECG revealing NSR with occasional PACs.   She is followed in outpatient cardiology by Dr. Saralyn Pilar. Most recent echocardiogram through [facility] on 08/26/19 revealed normal RV systolic function with mildly reduced LV systolic function, an EF estimated between 40-45% with mildly elevated pulmonary pressures, moderate MR. Most recent stress test in 2017 revealed hypokinesis of the anterior, apical, and inferior walls with a moderate perfusion abnormality in the lateral region on stress images.   Subjective   Able to ambulate but has sats in the low 90's on 2 liters.   Inpatient Medications    . amitriptyline  20 mg Oral QHS  . aspirin EC  81 mg Oral Daily  . enoxaparin (LOVENOX) injection  40 mg Subcutaneous Q24H  . furosemide  80 mg Intravenous BID  . influenza vaccine adjuvanted  0.5 mL Intramuscular Tomorrow-1000  .  leflunomide  10 mg Oral Daily  . lisinopril  2.5 mg Oral Daily  . mesalamine  1,000 mg Oral BID  . metolazone  5 mg Oral Once  . metoprolol succinate  50 mg Oral Daily  . multivitamin with minerals  1 tablet Oral Daily  . pantoprazole  40 mg Oral Daily  . potassium chloride  40 mEq Oral BID  . predniSONE  2 mg Oral QHS  . predniSONE  5 mg Oral Q breakfast  . sodium chloride flush  3 mL Intravenous Q12H  . spironolactone  12.5 mg Oral Daily  . thiamine  100 mg Oral Daily  . umeclidinium-vilanterol  1 puff Inhalation Daily    Vital Signs    Vitals:   11/25/19 0505 11/25/19 0750 11/25/19 0952 11/25/19 1215  BP: 135/63 (!) 142/62  127/63  Pulse: 71 72  77  Resp: 20 18  19   Temp: 98.4 F (36.9 C) 97.6 F (36.4 C)  98.3 F (36.8 C)  TempSrc: Oral Oral  Oral  SpO2: (!) 84% 100%  99%  Weight:   75.6 kg   Height:        Intake/Output Summary (Last 24 hours) at 11/25/2019 1505 Last data filed at 11/24/2019 2045 Gross per 24 hour  Intake 120 ml  Output 700 ml  Net -580 ml   Filed Weights   11/24/19 0013 11/24/19 0500 11/25/19 0952  Weight: 75.4 kg 75.4 kg 75.6 kg    Physical Exam    GEN: Well nourished, well developed, in  no acute distress.  HEENT: normal.  Neck: Supple, no JVD, carotid bruits, or masses. Cardiac: RRR, no murmurs, rubs, or gallops. No clubbing, cyanosis, edema.  Radials/DP/PT 2+ and equal bilaterally.  Respiratory:  Respirations regular and unlabored, clear to auscultation bilaterally. GI: Soft, nontender, nondistended, BS + x 4. MS: no deformity or atrophy. Skin: warm and dry, no rash. Neuro:  Strength and sensation are intact. Psych: Normal affect.  Labs    CBC Recent Labs    11/22/19 1945  WBC 7.6  HGB 12.0  HCT 36.9  MCV 95.6  PLT 342   Basic Metabolic Panel Recent Labs    11/24/19 0500 11/25/19 0534  NA 142 141  K 3.3* 4.3  CL 100 103  CO2 28 26  GLUCOSE 106* 131*  BUN 20 25*  CREATININE 0.96 0.95  CALCIUM 7.9* 8.4*  MG 1.5*   --    Liver Function Tests No results for input(s): AST, ALT, ALKPHOS, BILITOT, PROT, ALBUMIN in the last 72 hours. No results for input(s): LIPASE, AMYLASE in the last 72 hours. Cardiac Enzymes Recent Labs    11/24/19 0500  CKTOTAL 71   BNP Recent Labs    11/22/19 1945 11/25/19 0534  BNP 2,993.8* 967.5*   D-Dimer No results for input(s): DDIMER in the last 72 hours. Hemoglobin A1C No results for input(s): HGBA1C in the last 72 hours. Fasting Lipid Panel No results for input(s): CHOL, HDL, LDLCALC, TRIG, CHOLHDL, LDLDIRECT in the last 72 hours. Thyroid Function Tests No results for input(s): TSH, T4TOTAL, T3FREE, THYROIDAB in the last 72 hours.  Invalid input(s): FREET3  Telemetry    nsr  ECG    nsr  Radiology    DG Chest 2 View  Result Date: 11/22/2019 CLINICAL DATA:  Pain and swelling two right lower extremity for 2 days. EXAM: CHEST - 2 VIEW COMPARISON:  10/21/2018 FINDINGS: Mild cardiac enlargement. Aortic atherosclerosis. Elevated right hemidiaphragm, chronic. Diffuse pulmonary vascular congestion. Bibasilar atelectasis versus scarring. IMPRESSION: Cardiac enlargement and diffuse pulmonary vascular congestion. Electronically Signed   By: Kerby Moors M.D.   On: 11/22/2019 21:20   US Venous Img Lower Unilateral Right  Result Date: 11/22/2019 CLINICAL DATA:  Right lower extremity swelling for 2 days EXAM: RIGHT LOWER EXTREMITY VENOUS DOPPLER ULTRASOUND TECHNIQUE: Gray-scale sonography with compression, as well as color and duplex ultrasound, were performed to evaluate the deep venous system(s) from the level of the common femoral vein through the popliteal and proximal calf veins. COMPARISON:  Contralateral Doppler ultrasound 03/01/2016 FINDINGS: VENOUS Normal compressibility of the common femoral, superficial femoral, and popliteal veins, as well as the visualized calf veins. Visualized portions of profunda femoral vein and great saphenous vein unremarkable. No  filling defects to suggest DVT on grayscale or color Doppler imaging. Doppler waveforms show normal direction of venous flow, normal respiratory plasticity and response to augmentation. Limited views of the contralateral common femoral vein are unremarkable. OTHER None. Limitations: none IMPRESSION: No femoropopliteal DVT nor evidence of DVT within the visualized calf veins. If clinical symptoms are inconsistent or if there are persistent or worsening symptoms, further imaging (possibly involving the iliac veins) may be warranted. Electronically Signed   By: Lovena Le M.D.   On: 11/22/2019 18:40    Assessment & Plan     Principal Problem:   Acute on chronic systolic CHF (congestive heart failure) (HCC) Active Problems:   COPD (chronic obstructive pulmonary disease) (HCC)   Depression   CAD (coronary artery disease)   HTN (hypertension)  Acute on chronic systolic (congestive) heart failure (HCC)   Chronic respiratory failure with hypoxia (HCC)   1. Acute on chronic HFrEF  -With recent 18 pound weight gain on out patient regimen of  17m Lasix po daily  -Will continue with IV Lasix 81mBID for now and transition to oral when appropriate; continue close monitoring of potassium, renal function  -Discussed the importance of sodium and fluid restriction at home   2. History of coronary artery disease  -History of PCI in 2015 with no recent ischemic symptoms  -Continue aspirin 9138maily, atorvastatin 76m64mily, lisinopril 2.5mg 30mly, and metoprolol 50mg 57my   3. Hypokalemia  -Continue to monitor and replete potassium/magensium as needed   4. COPD  -Oxygen dependent; continue current medications. Still somewhat sob with ambulation. Will conitnue with bronchodilators and diuresis. May be ready for discharge in am.    Signed, KennetJavier Docker MD 11/25/2019, 3:05 PM  Pager: [phone]

## 2019-11-25 NOTE — Progress Notes (Signed)
Mobility Specialist - Progress Note   11/25/19 1221  Mobility  Activity Ambulated in room;Ambulated in hall  Level of Assistance Contact guard assist, steadying assist  Assistive Device None  Distance Ambulated (ft) 120 ft  Mobility Response Tolerated well  Mobility performed by Mobility specialist  $Mobility charge 1 Mobility    Pre-mobility: 72 HR, 102/72 BP, 100% SpO2 During mobility: 92 HR, 88% SpO2 Post-mobility: 90 HR, 91% SpO2   Pt sitting on recliner upon arrival. Pt agreed to mobility, but wanted to order lunch first. Mobility specialist assisted pt w/ ordering her lunch. Pt currently on 1L O2 [place]. Pt able to S2S w/ SBA. Pt ambulated 120' total in room and hallway w no AD. CGA utilized for safety and steadying. Noted pt would have LOB and SOB when increasing her pace during session. Pt encouraged to take her time. Pt's O2 desat to 88% while ambulating w/ 1L O2 [place]. Pt educated on utilizing PLB to manage her SOB and O2. Pt showed understanding and utilized PLB the rest of the session. Overall, pt tolerated session well. Pt left in recliner w/ all needs placed in reach. Doctor entered room at the end of session. Nurse was notified.    Shaiden Aldous Mobility Specialist  11/25/19, 12:31 PM

## 2019-11-25 NOTE — Progress Notes (Signed)
Patient ID: Raven [name], female   DOB: 1934-10-07, 85 y.o.   MRN: [id number] [facility] PROGRESS NOTE  Raven [name] XTA:[id number] DOB: [birth year] DOA: 11/22/2019 PCP: Baxter Hire, MD  HPI/Subjective: Patient feeling better today sitting up in chair.  Still with some cough.  Still little short of breath but better than when she came in.  Weights have been stable last couple days  Objective: Vitals:   11/25/19 0750 11/25/19 1215  BP: (!) 142/62 127/63  Pulse: 72 77  Resp: 18 19  Temp: 97.6 F (36.4 C) 98.3 F (36.8 C)  SpO2: 100% 99%    Intake/Output Summary (Last 24 hours) at 11/25/2019 1505 Last data filed at 11/24/2019 2045 Gross per 24 hour  Intake 120 ml  Output 700 ml  Net -580 ml   Filed Weights   11/24/19 0013 11/24/19 0500 11/25/19 0952  Weight: 75.4 kg 75.4 kg 75.6 kg    ROS: Review of Systems  Respiratory: Positive for cough and shortness of breath.   Cardiovascular: Negative for chest pain.  Gastrointestinal: Negative for abdominal pain, constipation, nausea and vomiting.  Musculoskeletal: Positive for joint pain and myalgias.   Exam: Physical Exam HENT:     Head: Normocephalic.     Nose: No mucosal edema.     Mouth/Throat:     Pharynx: No oropharyngeal exudate.  Eyes:     General: Lids are normal.     Conjunctiva/sclera: Conjunctivae normal.  Cardiovascular:     Rate and Rhythm: Normal rate and regular rhythm.     Heart sounds: Normal heart sounds, S1 normal and S2 normal.  Pulmonary:     Breath sounds: Examination of the right-lower field reveals decreased breath sounds and rales. Examination of the left-lower field reveals decreased breath sounds and rales. Decreased breath sounds and rales present. No wheezing or rhonchi.  Abdominal:     Palpations: Abdomen is soft.     Tenderness: There is no abdominal tenderness.  Musculoskeletal:     Right lower leg: Swelling present.     Left lower leg: Swelling present.  Skin:    General:  Skin is warm.     Findings: No rash.  Neurological:     Mental Status: She is alert and oriented to person, place, and time.       Data Reviewed: Basic Metabolic Panel: Recent Labs  Lab 11/22/19 1945 11/23/19 0633 11/24/19 0500 11/25/19 0534  NA 145 146* 142 141  K 3.0* 3.0* 3.3* 4.3  CL 107 104 100 103  CO2 25 29 28 26   GLUCOSE 112* 96 106* 131*  BUN 24* 22 20 25*  CREATININE 0.89 0.84 0.96 0.95  CALCIUM 9.0 8.6* 7.9* 8.4*  MG  --   --  1.5*  --    CBC: Recent Labs  Lab 11/22/19 1945  WBC 7.6  HGB 12.0  HCT 36.9  MCV 95.6  PLT 216   Cardiac Enzymes: Recent Labs  Lab 11/24/19 0500  CKTOTAL 71   BNP (last 3 results) Recent Labs    08/25/19 1041 11/22/19 1945 11/25/19 0534  BNP 635.0* 2,993.8* 967.5*     Recent Results (from the past 240 hour(s))  SARS Coronavirus 2 by RT PCR (hospital order, performed in [facility] hospital lab) Nasopharyngeal Nasopharyngeal Swab     Status: None   Collection Time: 11/22/19  7:45 PM   Specimen: Nasopharyngeal Swab  Result Value Ref Range Status   SARS Coronavirus 2 NEGATIVE NEGATIVE Final  Comment: (NOTE) SARS-CoV-2 target nucleic acids are NOT DETECTED.  The SARS-CoV-2 RNA is generally detectable in upper and lower respiratory specimens during the acute phase of infection. The lowest concentration of SARS-CoV-2 viral copies this assay can detect is 250 copies / mL. A negative result does not preclude SARS-CoV-2 infection and should not be used as the sole basis for treatment or other patient management decisions.  A negative result may occur with improper specimen collection / handling, submission of specimen other than nasopharyngeal swab, presence of viral mutation(s) within the areas targeted by this assay, and inadequate number of viral copies (<250 copies / mL). A negative result must be combined with clinical observations, patient history, and epidemiological information.  Fact Sheet for Patients:    StrictlyIdeas.no  Fact Sheet for Healthcare Providers: BankingDealers.co.za  This test is not yet approved or  cleared by the [place] FDA and has been authorized for detection and/or diagnosis of SARS-CoV-2 by FDA under an Emergency Use Authorization (EUA).  This EUA will remain in effect (meaning this test can be used) for the duration of the COVID-19 declaration under Section 564(b)(1) of the Act, 21 U.S.C. section 360bbb-3(b)(1), unless the authorization is terminated or revoked sooner.  Performed at [facility], [address] [place], [place] [address]      Scheduled Meds: . amitriptyline  20 mg Oral QHS  . aspirin EC  81 mg Oral Daily  . enoxaparin (LOVENOX) injection  40 mg Subcutaneous Q24H  . furosemide  80 mg Intravenous BID  . influenza vaccine adjuvanted  0.5 mL Intramuscular Tomorrow-1000  . leflunomide  10 mg Oral Daily  . lisinopril  2.5 mg Oral Daily  . mesalamine  1,000 mg Oral BID  . metolazone  5 mg Oral Once  . metoprolol succinate  50 mg Oral Daily  . multivitamin with minerals  1 tablet Oral Daily  . pantoprazole  40 mg Oral Daily  . potassium chloride  40 mEq Oral BID  . predniSONE  2 mg Oral QHS  . predniSONE  5 mg Oral Q breakfast  . sodium chloride flush  3 mL Intravenous Q12H  . spironolactone  12.5 mg Oral Daily  . thiamine  100 mg Oral Daily  . umeclidinium-vilanterol  1 puff Inhalation Daily   Continuous Infusions: . sodium chloride      Assessment/Plan:  1. Acute on chronic systolic congestive heart failure.  Patient had weight gain and swelling of the lower extremities at home.  Patient also has shortness of breath.  Continue Lasix 80 mg IV twice a day.  Patient already on Toprol and low-dose lisinopril.  Continue Aldactone.  TED hose.  We will give a dose of Zaroxolyn today.  Would like to see her weight decrease a little bit. 2. Acute hypoxic respiratory failure.  Oxygen  dropped down to 88% with ambulation today.  She does wear oxygen at night standing.  May end up needing oxygen 24/7. 3. COPD unspecified.  Continue inhalers. 4. Hypomagnesemia and hypokalemia.  Magnesium replaced yesterday.  Potassium in normal range decrease supplementation to twice a day. 5. Leg pains.  Continue statin holiday.  Ultrasound right lower extremity negative for DVT.  CPK normal range. 6. Hyperlipidemia unspecified.  Statin holiday 7. Essential hypertension on lisinopril, Toprol and Aldactone 8. Weakness.  Physical therapy evaluation appreciated      Code Status:     Code Status Orders  (From admission, onward)         Start  Ordered   11/22/19 2132  Full code  Continuous        11/22/19 2134        Code Status History    Date Active Date Inactive Code Status Order ID Comments User Context   08/25/2019 2102 08/27/2019 1931 Partial Code [id number]  Ivor Costa, MD Inpatient   08/25/2019 1729 08/25/2019 2102 Partial Code [id number]  Ivor Costa, MD ED   06/17/2019 1809 06/21/2019 2316 DNR [id number]  Fritzi Mandes, MD ED   10/20/2018 1500 10/23/2018 1317 DNR [id number]  Hillary Bow, MD ED   04/22/2016 0945 04/24/2016 1909 Full Code [id number]  Bettey Costa, MD Inpatient   04/22/2016 0932 04/22/2016 0945 DNR [id number]  Bettey Costa, MD Inpatient   04/17/2016 1853 04/20/2016 1352 Full Code [id number]  Vaughan Basta, MD Inpatient   04/19/2015 2024 04/21/2015 1714 Full Code [id number]  Dustin Flock, MD Inpatient   11/02/2014 1202 11/08/2014 1741 Full Code [id number]  Bettey Costa, MD Inpatient   Advance Care Planning Activity    Advance Directive Documentation     Most Recent Value  Type of Advance Directive Healthcare Power of [place], Living will  Pre-existing out of facility DNR order (yellow form or pink MOST form) --  "MOST" Form in Place? --     Family Communication: Spoke with daughter on the phone Disposition Plan: Status is: Inpatient  Dispo: The patient is from:  Home              Anticipated d/c is to: Home              Anticipated d/c date is: Evaluate on a day-to-day basis on when to go home              Patient currently treated for acute systolic congestive heart failure with IV Lasix twice daily.  We will give a dose of Zaroxolyn today since weights have been stable.  Time spent: 28 minutes.  Case discussed with cardiology.  [place]  [facility] [employer]

## 2019-11-26 DIAGNOSIS — E875 Hyperkalemia: Secondary | ICD-10-CM

## 2019-11-26 DIAGNOSIS — J9621 Acute and chronic respiratory failure with hypoxia: Secondary | ICD-10-CM

## 2019-11-26 LAB — BASIC METABOLIC PANEL
Anion gap: 11 (ref 5–15)
BUN: 36 mg/dL — ABNORMAL HIGH (ref 8–23)
CO2: 30 mmol/L (ref 22–32)
Calcium: 9.2 mg/dL (ref 8.9–10.3)
Chloride: 99 mmol/L (ref 98–111)
Creatinine, Ser: 1.21 mg/dL — ABNORMAL HIGH (ref 0.44–1.00)
GFR calc Af Amer: 48 mL/min — ABNORMAL LOW (ref >60)
GFR calc non Af Amer: 41 mL/min — ABNORMAL LOW (ref >60)
Glucose, Bld: 136 mg/dL — ABNORMAL HIGH (ref 70–99)
Potassium: 5.1 mmol/L (ref 3.5–5.1)
Sodium: 140 mmol/L (ref 135–145)

## 2019-11-26 MED ORDER — FUROSEMIDE 40 MG PO TABS
40.0000 mg | ORAL_TABLET | Freq: Two times a day (BID) | ORAL | 0 refills | Status: DC
Start: 2019-11-26 — End: 2020-05-23

## 2019-11-26 MED ORDER — SPIRONOLACTONE 25 MG PO TABS
12.5000 mg | ORAL_TABLET | Freq: Every day | ORAL | 0 refills | Status: DC
Start: 2019-11-26 — End: 2019-12-06

## 2019-11-26 NOTE — Progress Notes (Signed)
Raven [name] to be D/C'd Home per MD order.  Discussed prescriptions and follow up appointments with the patient. Prescriptions electronically submitted.  medication list explained in detail. Pt verbalized understanding.  Allergies as of 11/26/2019      Reactions   Penicillins Other (See Comments)   Reaction:  Unknown  Has patient had a PCN reaction causing immediate rash, facial/tongue/throat swelling, SOB or lightheadedness with hypotension: unknown Has patient had a PCN reaction causing severe rash involving mucus membranes or skin necrosis: unknown Has patient had a PCN reaction that required hospitalization No Has patient had a PCN reaction occurring within the last 10 years: No If all of the above answers are "NO", then may proceed with Cephalosporin use.   Augmentin [amoxicillin-pot Clavulanate] Other (See Comments)   Reaction:  Unknown    Indocin [indomethacin] Other (See Comments)   Reaction:  Unknown    Lodine [etodolac] Other (See Comments)   Reaction:   GI upset    Methotrexate Derivatives Other (See Comments)   Reaction:  GI upset    Gold Rash   Zantac [ranitidine Hcl] Rash      Medication List    STOP taking these medications   atorvastatin 80 MG tablet Commonly known as: LIPITOR   nitroGLYCERIN 0.4 MG SL tablet Commonly known as: NITROSTAT   potassium chloride 10 MEQ tablet Commonly known as: KLOR-CON     TAKE these medications   acetaminophen 500 MG tablet Commonly known as: TYLENOL Take 500 mg by mouth every 6 (six) hours as needed.   albuterol 108 (90 Base) MCG/ACT inhaler Commonly known as: VENTOLIN HFA Inhale 2 puffs into the lungs every 6 (six) hours as needed for wheezing or shortness of breath.   amitriptyline 10 MG tablet Commonly known as: ELAVIL Take 20 mg by mouth at bedtime.   Anoro Ellipta 62.5-25 MCG/INH Aepb Generic drug: umeclidinium-vilanterol Inhale 1 puff into the lungs daily.   aspirin EC 81 MG tablet Take 81 mg by mouth  daily.   CITRACAL +D3 PO Take 1 tablet by mouth daily.   clonazePAM 0.25 MG disintegrating tablet Commonly known as: KLONOPIN Take 1 tablet (0.25 mg total) by mouth daily as needed (anxiety).   esomeprazole 40 MG capsule Commonly known as: NEXIUM Take 40 mg by mouth daily.   furosemide 40 MG tablet Commonly known as: LASIX Take 1 tablet (40 mg total) by mouth 2 (two) times daily. What changed: when to take this   Humira 40 MG/0.8ML Pskt Generic drug: Adalimumab Inject 40 mg into the skin every 14 (fourteen) days.   leflunomide 10 MG tablet Commonly known as: ARAVA Take 10 mg by mouth daily.   lisinopril 2.5 MG tablet Commonly known as: ZESTRIL Take 1 tablet (2.5 mg total) by mouth daily.   mesalamine 500 MG CR capsule Commonly known as: PENTASA Take 1,000 mg by mouth 2 (two) times daily.   metoprolol succinate 50 MG 24 hr tablet Commonly known as: TOPROL-XL Take 50 mg by mouth daily.   multivitamin with minerals Tabs tablet Take 1 tablet by mouth daily.   predniSONE 1 MG tablet Commonly known as: DELTASONE Take by mouth. Take 2 tablets at bedtime   predniSONE 5 MG tablet Commonly known as: DELTASONE Take 5 mg by mouth daily with breakfast.   risedronate 150 MG tablet Commonly known as: ACTONEL Take 150 mg by mouth every 30 (thirty) days. with water on empty stomach, nothing by mouth or lie down for next 30 minutes.  senna 8.6 MG tablet Commonly known as: SENOKOT Take 2 tablets by mouth at bedtime.   spironolactone 25 MG tablet Commonly known as: ALDACTONE Take 0.5 tablets (12.5 mg total) by mouth daily.   thiamine 100 MG tablet Commonly known as: Vitamin B-1 Take 100 mg by mouth daily.   Vitamin B12-Folic Acid 292-909 MCG Tabs Take by mouth daily.   vitamin E 180 MG (400 UNITS) capsule Take 400 Units by mouth daily.       Vitals:   11/26/19 0752 11/26/19 1114  BP: 135/69 137/66  Pulse: 75 86  Resp: 18 16  Temp:  97.7 F (36.5 C)  SpO2:  100% 93%    Skin clean, dry and intact without evidence of skin break down, no evidence of skin tears noted.  Pt denies pain at this time. No complaints noted.  An After Visit Summary was printed and given to the patient. Patient waiting on ride to be discharged home. With personal o2 tank  Raven [name]

## 2019-11-26 NOTE — Progress Notes (Signed)
SATURATION QUALIFICATIONS: (This note is used to comply with regulatory documentation for home oxygen)  Patient Saturations on Room Air at Rest = 92%  Patient Saturations on Room Air while Ambulating = 85%  Patient Saturations on 1 Liters of oxygen while Ambulating = 93%  Please briefly explain why patient needs home oxygen:

## 2019-11-26 NOTE — Plan of Care (Signed)
  Problem: Clinical Measurements: Goal: Ability to maintain clinical measurements within normal limits will improve Outcome: Progressing Goal: Respiratory complications will improve Outcome: Progressing Goal: Cardiovascular complication will be avoided Outcome: Progressing   Problem: Education: Goal: Ability to demonstrate management of disease process will improve Outcome: Progressing   Problem: Activity: Goal: Capacity to carry out activities will improve Outcome: Progressing   Problem: Cardiac: Goal: Ability to achieve and maintain adequate cardiopulmonary perfusion will improve Outcome: Progressing

## 2019-11-26 NOTE — Progress Notes (Signed)
Patient Name: Raven [name] Date of Encounter: 11/26/2019  Hospital Problem List     Principal Problem:   Acute on chronic systolic CHF (congestive heart failure) (HCC) Active Problems:   COPD (chronic obstructive pulmonary disease) (HCC)   Acute respiratory failure with hypoxia (HCC)   Depression   CAD (coronary artery disease)   HTN (hypertension)   Acute on chronic systolic (congestive) heart failure (HCC)   Chronic respiratory failure with hypoxia (HCC)   Hypomagnesemia   Pain in both lower extremities   Weakness    Patient Profile     85 year old female with a past medical history significant for coronary artery disease with history of NSTEMI s/p PCI with a Promus stent to the OM1 on 08/10/13 at [facility], chronic HFpEF, oxygen dependent COPD on 3L, RA, hyperlipidemia, and hypertension who presented to the ED on 11/22/19 for a few day history of right lower extremity swelling with associated shortness of breath and weight gain. Workup in the ED was significant for BNP of 2993, potassium of 3.0, venous US negative for a DVT, chest xray revealing diffuse pulmonary vascular congestion, and ECG revealing NSR with occasional PACs.   She is followed in outpatient cardiology by Dr. Saralyn Pilar. Most recent echocardiogram through [facility] on 08/26/19 revealed normal RV systolic function with mildly reduced LV systolic function, an EF estimated between 40-45% with mildly elevated pulmonary pressures, moderate MR. Most recent stress test in 2017 revealed hypokinesis of the anterior, apical, and inferior walls with a moderate perfusion abnormality in the lateral region on stress images.    Subjective   Less short of breath. Appears to be at her baseline. Has oxygen at home and uses it at night and occasionally during the day.   Inpatient Medications    . amitriptyline  20 mg Oral QHS  . aspirin EC  81 mg Oral Daily  . enoxaparin (LOVENOX) injection  40 mg Subcutaneous  Q24H  . influenza vaccine adjuvanted  0.5 mL Intramuscular Tomorrow-1000  . leflunomide  10 mg Oral Daily  . lisinopril  2.5 mg Oral Daily  . mesalamine  1,000 mg Oral BID  . metoprolol succinate  50 mg Oral Daily  . multivitamin with minerals  1 tablet Oral Daily  . pantoprazole  40 mg Oral Daily  . predniSONE  2 mg Oral QHS  . predniSONE  5 mg Oral Q breakfast  . sodium chloride flush  3 mL Intravenous Q12H  . spironolactone  12.5 mg Oral Daily  . thiamine  100 mg Oral Daily  . umeclidinium-vilanterol  1 puff Inhalation Daily    Vital Signs    Vitals:   11/25/19 1215 11/25/19 1513 11/26/19 0457 11/26/19 0752  BP: 127/63 (!) 141/74 (!) 152/81 135/69  Pulse: 77 93 66 75  Resp: 19 18  18   Temp: 98.3 F (36.8 C) 98.4 F (36.9 C) (!) 97.5 F (36.4 C)   TempSrc: Oral  Oral   SpO2: 99% 95% (!) 89% 100%  Weight:   73.9 kg   Height:        Intake/Output Summary (Last 24 hours) at 11/26/2019 0956 Last data filed at 11/26/2019 0506 Gross per 24 hour  Intake --  Output 2400 ml  Net -2400 ml   Filed Weights   11/24/19 0500 11/25/19 0952 11/26/19 0457  Weight: 75.4 kg 75.6 kg 73.9 kg    Physical Exam    GEN: Well nourished, well developed, in no acute distress.  HEENT: normal.  Neck: Supple, no JVD, carotid bruits, or masses. Cardiac: RRR, no murmurs, rubs, or gallops. No clubbing, cyanosis, edema.  Radials/DP/PT 2+ and equal bilaterally.  Respiratory:  Respirations regular and unlabored, faint crackles at bases.  GI: Soft, nontender, nondistended, BS + x 4. MS: no deformity or atrophy. Skin: warm and dry, no rash. Neuro:  Strength and sensation are intact. Psych: Normal affect.  Labs    CBC No results for input(s): WBC, NEUTROABS, HGB, HCT, MCV, PLT in the last 72 hours. Basic Metabolic Panel Recent Labs    11/24/19 0500 11/24/19 0500 11/25/19 0534 11/26/19 0357  NA 142   < > 141 140  K 3.3*   < > 4.3 5.1  CL 100   < > 103 99  CO2 28   < > 26 30  GLUCOSE  106*   < > 131* 136*  BUN 20   < > 25* 36*  CREATININE 0.96   < > 0.95 1.21*  CALCIUM 7.9*   < > 8.4* 9.2  MG 1.5*  --   --   --    < > = values in this interval not displayed.   Liver Function Tests No results for input(s): AST, ALT, ALKPHOS, BILITOT, PROT, ALBUMIN in the last 72 hours. No results for input(s): LIPASE, AMYLASE in the last 72 hours. Cardiac Enzymes Recent Labs    11/24/19 0500  CKTOTAL 71   BNP Recent Labs    11/25/19 0534  BNP 967.5*   D-Dimer No results for input(s): DDIMER in the last 72 hours. Hemoglobin A1C No results for input(s): HGBA1C in the last 72 hours. Fasting Lipid Panel No results for input(s): CHOL, HDL, LDLCALC, TRIG, CHOLHDL, LDLDIRECT in the last 72 hours. Thyroid Function Tests No results for input(s): TSH, T4TOTAL, T3FREE, THYROIDAB in the last 72 hours.  Invalid input(s): FREET3  Telemetry    nsr  ECG    nsr  Radiology    DG Chest 2 View  Result Date: 11/22/2019 CLINICAL DATA:  Pain and swelling two right lower extremity for 2 days. EXAM: CHEST - 2 VIEW COMPARISON:  10/21/2018 FINDINGS: Mild cardiac enlargement. Aortic atherosclerosis. Elevated right hemidiaphragm, chronic. Diffuse pulmonary vascular congestion. Bibasilar atelectasis versus scarring. IMPRESSION: Cardiac enlargement and diffuse pulmonary vascular congestion. Electronically Signed   By: Kerby Moors M.D.   On: 11/22/2019 21:20   US Venous Img Lower Unilateral Right  Result Date: 11/22/2019 CLINICAL DATA:  Right lower extremity swelling for 2 days EXAM: RIGHT LOWER EXTREMITY VENOUS DOPPLER ULTRASOUND TECHNIQUE: Gray-scale sonography with compression, as well as color and duplex ultrasound, were performed to evaluate the deep venous system(s) from the level of the common femoral vein through the popliteal and proximal calf veins. COMPARISON:  Contralateral Doppler ultrasound 03/01/2016 FINDINGS: VENOUS Normal compressibility of the common femoral, superficial  femoral, and popliteal veins, as well as the visualized calf veins. Visualized portions of profunda femoral vein and great saphenous vein unremarkable. No filling defects to suggest DVT on grayscale or color Doppler imaging. Doppler waveforms show normal direction of venous flow, normal respiratory plasticity and response to augmentation. Limited views of the contralateral common femoral vein are unremarkable. OTHER None. Limitations: none IMPRESSION: No femoropopliteal DVT nor evidence of DVT within the visualized calf veins. If clinical symptoms are inconsistent or if there are persistent or worsening symptoms, further imaging (possibly involving the iliac veins) may be warranted. Electronically Signed   By: Lovena Le M.D.   On: 11/22/2019 18:40  Assessment & Plan     Principal Problem: Acute on chronic systolic CHF (congestive heart failure) (HCC) Active Problems: COPD (chronic obstructive pulmonary disease) (HCC) Depression CAD (coronary artery disease) HTN (hypertension) Acute on chronic systolic (congestive) heart failure (HCC) Chronic respiratory failure with hypoxia ([place])  1. Acute on chronic HFrEF  -BNP improved and weight down. Appears stable for discharge with early outpatient follow up.  -Will continue with lasix 40 mg bid  -Discussed the importance of sodium and fluid restriction at home   2. History of coronary artery disease  -History of PCI in 2015 with no recent ischemic symptoms  -Continue aspirin 84m daily,  lisinopril 2.518mdaily, and metoprolol 5070maily Will hold atorvastatin at present due to leg discomfort.   3. Hypokalemia  -Continue to monitor and repletepotassium/magensiumto keep K greater than  4 and mg greater than 2. Agree with discharge on lasix 40 mg daily.    4. COPD  -Oxygen dependent; continue current medications. Still somewhat sob with  ambulation. Will conitnue with bronchodilators and diuresis. OK for discharge from cardiac standpoint. Will need oxygen 24/7 due to desat with ambulation to the mid 80's  Signed, KenChrissie Noa Karrin Eisenmenger MD 11/26/2019, 9:56 AM  Pager: [phone]

## 2019-11-26 NOTE — Discharge Summary (Signed)
[facility] [place] [facility] [place] NAME: Raven [name]    MR#:  [id number]  DATE OF BIRTH:  03-25-1934  DATE OF ADMISSION:  11/22/2019 ADMITTING PHYSICIAN: Athena Masse, MD  DATE OF DISCHARGE: 11/26/2019  1:13 PM  PRIMARY CARE PHYSICIAN: Baxter Hire, MD    ADMISSION DIAGNOSIS:  Acute on chronic systolic (congestive) heart failure (HCC) [I50.23] Acute on chronic congestive heart failure, unspecified heart failure type ([place]) [I50.9]  DISCHARGE DIAGNOSIS:  Principal Problem:   Acute on chronic systolic CHF (congestive heart failure) (HCC) Active Problems:   COPD (chronic obstructive pulmonary disease) (HCC)   Acute respiratory failure with hypoxia (HCC)   Depression   CAD (coronary artery disease)   HTN (hypertension)   Acute on chronic systolic (congestive) heart failure (HCC)   Chronic respiratory failure with hypoxia (HCC)   Hypomagnesemia   Pain in both lower extremities   Weakness   SECONDARY DIAGNOSIS:   Past Medical History:  Diagnosis Date   Anemia    Arthritis    Asthma    CHF (congestive heart failure) (HCC)    Complication of anesthesia    COPD (chronic obstructive pulmonary disease) (HCC)    Crohn's disease (HCC)    Depression    after death of husband   Hyperlipidemia    Hypertension    Myocardial infarction ([place])    Osteopenia    Peptic ulcer disease    Pneumonia    PONV (postoperative nausea and vomiting)     HOSPITAL COURSE:   1.  Acute on chronic systolic congestive heart failure.  Patient came in with significant weight gain and swelling of lower extremities.  Patient also had shortness of breath and cough.  Patient was given Lasix 80 mg twice a day IV during the hospital course.  Patient was given 1 dose of Zaroxolyn the day before discharge.  The patient's weight did come down to 73.963 kg upon discharge.  The patient's creatinine did bump up up to 1.21 upon discharge.  I held Lasix on the  morning of discharge but will need Lasix 40 mg twice daily upon disposition.  The patient was started on low-dose Aldactone.  Patient already on Toprol and low-dose of lisinopril.  Can consider [facility] as outpatient.  Follow-up at the CHF clinic.  Seen in the hospital by cardiology.  Close clinical follow-up with a BMP at follow-up appointment. 2.  Acute on chronic hypoxic respiratory failure, COPD.  The patient's oxygen did drop down to 85% while ambulating on room air.  The patient requires oxygen 24/7.  She already has an indigent machine at home and oxygen at her bed. 3.  Hypomagnesemia and hypokalemia.  Magnesium replaced IV during the hospital course.  Potassium over replaced up to 5.1.  We will hold potassium replacement upon discharge since the patient is already on lisinopril and Aldactone.  Check a BMP in follow-up appointment. 4.  Leg pains.  Ultrasound the right lower extremity negative for DVT.  CPK normal range I will give a statin holiday and see if this improves pain.  At the time of discharge leg sensitivity has improved. 5.  Hyperlipidemia unspecified.  Continue statin holiday. 6.  Essential hypertension on lisinopril, Toprol and Aldactone   DISCHARGE CONDITIONS:  Satisfactory  CONSULTS OBTAINED:  Treatment Team:  Teodoro Spray, MD  DRUG ALLERGIES:   Allergies  Allergen Reactions   Penicillins Other (See Comments)    Reaction:  Unknown  Has patient had a  PCN reaction causing immediate rash, facial/tongue/throat swelling, SOB or lightheadedness with hypotension: unknown Has patient had a PCN reaction causing severe rash involving mucus membranes or skin necrosis: unknown Has patient had a PCN reaction that required hospitalization No Has patient had a PCN reaction occurring within the last 10 years: No If all of the above answers are "NO", then may proceed with Cephalosporin use.    Augmentin [Amoxicillin-Pot Clavulanate] Other (See Comments)    Reaction:  Unknown     Indocin [Indomethacin] Other (See Comments)    Reaction:  Unknown    Lodine [Etodolac] Other (See Comments)    Reaction:   GI upset    Methotrexate Derivatives Other (See Comments)    Reaction:  GI upset    Gold Rash   Zantac [Ranitidine Hcl] Rash    DISCHARGE MEDICATIONS:   Allergies as of 11/26/2019      Reactions   Penicillins Other (See Comments)   Reaction:  Unknown  Has patient had a PCN reaction causing immediate rash, facial/tongue/throat swelling, SOB or lightheadedness with hypotension: unknown Has patient had a PCN reaction causing severe rash involving mucus membranes or skin necrosis: unknown Has patient had a PCN reaction that required hospitalization No Has patient had a PCN reaction occurring within the last 10 years: No If all of the above answers are "NO", then may proceed with Cephalosporin use.   Augmentin [amoxicillin-pot Clavulanate] Other (See Comments)   Reaction:  Unknown    Indocin [indomethacin] Other (See Comments)   Reaction:  Unknown    Lodine [etodolac] Other (See Comments)   Reaction:   GI upset    Methotrexate Derivatives Other (See Comments)   Reaction:  GI upset    Gold Rash   Zantac [ranitidine Hcl] Rash      Medication List    STOP taking these medications   atorvastatin 80 MG tablet Commonly known as: LIPITOR   nitroGLYCERIN 0.4 MG SL tablet Commonly known as: NITROSTAT   potassium chloride 10 MEQ tablet Commonly known as: KLOR-CON     TAKE these medications   acetaminophen 500 MG tablet Commonly known as: TYLENOL Take 500 mg by mouth every 6 (six) hours as needed.   albuterol 108 (90 Base) MCG/ACT inhaler Commonly known as: VENTOLIN HFA Inhale 2 puffs into the lungs every 6 (six) hours as needed for wheezing or shortness of breath.   amitriptyline 10 MG tablet Commonly known as: ELAVIL Take 20 mg by mouth at bedtime.   Anoro Ellipta 62.5-25 MCG/INH Aepb Generic drug: umeclidinium-vilanterol Inhale 1 puff into  the lungs daily.   aspirin EC 81 MG tablet Take 81 mg by mouth daily.   CITRACAL +D3 PO Take 1 tablet by mouth daily.   clonazePAM 0.25 MG disintegrating tablet Commonly known as: KLONOPIN Take 1 tablet (0.25 mg total) by mouth daily as needed (anxiety).   esomeprazole 40 MG capsule Commonly known as: NEXIUM Take 40 mg by mouth daily.   furosemide 40 MG tablet Commonly known as: LASIX Take 1 tablet (40 mg total) by mouth 2 (two) times daily. What changed: when to take this   Humira 40 MG/0.8ML Pskt Generic drug: Adalimumab Inject 40 mg into the skin every 14 (fourteen) days.   leflunomide 10 MG tablet Commonly known as: ARAVA Take 10 mg by mouth daily.   lisinopril 2.5 MG tablet Commonly known as: ZESTRIL Take 1 tablet (2.5 mg total) by mouth daily.   mesalamine 500 MG CR capsule Commonly known as: PENTASA Take  1,000 mg by mouth 2 (two) times daily.   metoprolol succinate 50 MG 24 hr tablet Commonly known as: TOPROL-XL Take 50 mg by mouth daily.   multivitamin with minerals Tabs tablet Take 1 tablet by mouth daily.   predniSONE 1 MG tablet Commonly known as: DELTASONE Take by mouth. Take 2 tablets at bedtime   predniSONE 5 MG tablet Commonly known as: DELTASONE Take 5 mg by mouth daily with breakfast.   risedronate 150 MG tablet Commonly known as: ACTONEL Take 150 mg by mouth every 30 (thirty) days. with water on empty stomach, nothing by mouth or lie down for next 30 minutes.   senna 8.6 MG tablet Commonly known as: SENOKOT Take 2 tablets by mouth at bedtime.   spironolactone 25 MG tablet Commonly known as: ALDACTONE Take 0.5 tablets (12.5 mg total) by mouth daily.   thiamine 100 MG tablet Commonly known as: Vitamin B-1 Take 100 mg by mouth daily.   Vitamin B12-Folic Acid 465-035 MCG Tabs Take by mouth daily.   vitamin E 180 MG (400 UNITS) capsule Take 400 Units by mouth daily.        DISCHARGE INSTRUCTIONS:   Follow-up PMD 5  days Follow-up Dr. Lorinda Creed cardiology Follow-up CHF clinic  If you experience worsening of your admission symptoms, develop shortness of breath, life threatening emergency, suicidal or homicidal thoughts you must seek medical attention immediately by calling 911 or calling your MD immediately  if symptoms less severe.  You Must read complete instructions/literature along with all the possible adverse reactions/side effects for all the Medicines you take and that have been prescribed to you. Take any new Medicines after you have completely understood and accept all the possible adverse reactions/side effects.   Please note  You were cared for by a hospitalist during your hospital stay. If you have any questions about your discharge medications or the care you received while you were in the hospital after you are discharged, you can call the unit and asked to speak with the hospitalist on call if the hospitalist that took care of you is not available. Once you are discharged, your primary care physician will handle any further medical issues. Please note that NO REFILLS for any discharge medications will be authorized once you are discharged, as it is imperative that you return to your primary care physician (or establish a relationship with a primary care physician if you do not have one) for your aftercare needs so that they can reassess your need for medications and monitor your lab values.    Today   CHIEF COMPLAINT:   Chief Complaint  Patient presents with   Leg Swelling    HISTORY OF PRESENT ILLNESS:  Raven [name]  is a 85 y.o. female came in with leg swelling and pain.    VITAL SIGNS:  Blood pressure 137/66, pulse 86, temperature 97.7 F (36.5 C), temperature source Oral, resp. rate 16, height 5' 7"  (1.702 m), weight 73.9 kg, SpO2 93 %.  I/O:    Intake/Output Summary (Last 24 hours) at 11/26/2019 1644 Last data filed at 11/26/2019 0506 Gross per 24 hour  Intake --  Output  2100 ml  Net -2100 ml    PHYSICAL EXAMINATION:  GENERAL:  85 y.o.-year-old patient lying in the bed with no acute distress.  EYES:  No scleral icterus.  HEENT: Head atraumatic, normocephalic. Oropharynx and nasopharynx clear.  LUNGS: Normal breath sounds bilaterally, no wheezing, rales,rhonchi or crepitation. No use of accessory muscles of respiration.  CARDIOVASCULAR: S1, S2 normal. No murmurs, rubs, or gallops.  ABDOMEN: Soft, non-tender, non-distended.  EXTREMITIES: Trace pedal edema.  NEUROLOGIC: Cranial nerves II through XII are intact. Muscle strength 5/5 in all extremities. Sensation intact. Gait not checked.  PSYCHIATRIC: The patient is alert and oriented x 3.  SKIN: No obvious rash, lesion, or ulcer.   DATA REVIEW:   CBC Recent Labs  Lab 11/22/19 1945  WBC 7.6  HGB 12.0  HCT 36.9  PLT 216    Chemistries  Recent Labs  Lab 11/24/19 0500 11/25/19 0534 11/26/19 0357  NA 142   < > 140  K 3.3*   < > 5.1  CL 100   < > 99  CO2 28   < > 30  GLUCOSE 106*   < > 136*  BUN 20   < > 36*  CREATININE 0.96   < > 1.21*  CALCIUM 7.9*   < > 9.2  MG 1.5*  --   --    < > = values in this interval not displayed.    Microbiology Results  Results for orders placed or performed during the hospital encounter of 11/22/19  SARS Coronavirus 2 by RT PCR (hospital order, performed in [facility] hospital lab) Nasopharyngeal Nasopharyngeal Swab     Status: None   Collection Time: 11/22/19  7:45 PM   Specimen: Nasopharyngeal Swab  Result Value Ref Range Status   SARS Coronavirus 2 NEGATIVE NEGATIVE Final    Comment: (NOTE) SARS-CoV-2 target nucleic acids are NOT DETECTED.  The SARS-CoV-2 RNA is generally detectable in upper and lower respiratory specimens during the acute phase of infection. The lowest concentration of SARS-CoV-2 viral copies this assay can detect is 250 copies / mL. A negative result does not preclude SARS-CoV-2 infection and should not be used as the sole basis  for treatment or other patient management decisions.  A negative result may occur with improper specimen collection / handling, submission of specimen other than nasopharyngeal swab, presence of viral mutation(s) within the areas targeted by this assay, and inadequate number of viral copies (<250 copies / mL). A negative result must be combined with clinical observations, patient history, and epidemiological information.  Fact Sheet for Patients:   StrictlyIdeas.no  Fact Sheet for Healthcare Providers: BankingDealers.co.za  This test is not yet approved or  cleared by the [place] FDA and has been authorized for detection and/or diagnosis of SARS-CoV-2 by FDA under an Emergency Use Authorization (EUA).  This EUA will remain in effect (meaning this test can be used) for the duration of the COVID-19 declaration under Section 564(b)(1) of the Act, 21 U.S.C. section 360bbb-3(b)(1), unless the authorization is terminated or revoked sooner.  Performed at [facility], [address], [place], [place] [address]     Management plans discussed with the patient, family and they are in agreement.  CODE STATUS:     Code Status Orders  (From admission, onward)         Start     Ordered   11/22/19 2132  Full code  Continuous        11/22/19 2134        Code Status History    Date Active Date Inactive Code Status Order ID Comments User Context   08/25/2019 2102 08/27/2019 1931 Partial Code [id number]  Ivor Costa, MD Inpatient   08/25/2019 1729 08/25/2019 2102 Partial Code [id number]  Ivor Costa, MD ED   06/17/2019 1809 06/21/2019 2316 DNR [id number]  Fritzi Mandes,  MD ED   10/20/2018 1500 10/23/2018 1317 DNR [id number]  Hillary Bow, MD ED   04/22/2016 0945 04/24/2016 1909 Full Code [id number]  Bettey Costa, MD Inpatient   04/22/2016 0932 04/22/2016 0945 DNR [id number]  Bettey Costa, MD Inpatient   04/17/2016 1853 04/20/2016 1352 Full Code  [id number]  Vaughan Basta, MD Inpatient   04/19/2015 2024 04/21/2015 1714 Full Code [id number]  Dustin Flock, MD Inpatient   11/02/2014 1202 11/08/2014 1741 Full Code [id number]  Bettey Costa, MD Inpatient   Advance Care Planning Activity    Advance Directive Documentation     Most Recent Value  Type of Advance Directive Healthcare Power of [place], Living will  Pre-existing out of facility DNR order (yellow form or pink MOST form) --  "MOST" Form in Place? --      TOTAL TIME TAKING CARE OF THIS PATIENT: 35 minutes.    Loletha Grayer M.D on 11/26/2019 at 4:44 PM  Between 7am to 6pm - Pager - [phone]  After 6pm go to [email or web address] - password [email or web address]  [facility]  CC: Primary care physician; Baxter Hire, MD

## 2019-12-06 ENCOUNTER — Other Ambulatory Visit: Payer: Self-pay

## 2019-12-06 ENCOUNTER — Ambulatory Visit: Payer: Medicare Other | Attending: Family | Admitting: Family

## 2019-12-06 ENCOUNTER — Encounter: Payer: Self-pay | Admitting: Family

## 2019-12-06 VITALS — BP 143/68 | HR 83 | Resp 18 | Ht 67.0 in | Wt 163.5 lb

## 2019-12-06 DIAGNOSIS — Z8249 Family history of ischemic heart disease and other diseases of the circulatory system: Secondary | ICD-10-CM | POA: Diagnosis not present

## 2019-12-06 DIAGNOSIS — Z881 Allergy status to other antibiotic agents status: Secondary | ICD-10-CM | POA: Insufficient documentation

## 2019-12-06 DIAGNOSIS — M1990 Unspecified osteoarthritis, unspecified site: Secondary | ICD-10-CM | POA: Diagnosis not present

## 2019-12-06 DIAGNOSIS — Z880 Allergy status to penicillin: Secondary | ICD-10-CM | POA: Insufficient documentation

## 2019-12-06 DIAGNOSIS — Z7982 Long term (current) use of aspirin: Secondary | ICD-10-CM | POA: Insufficient documentation

## 2019-12-06 DIAGNOSIS — I509 Heart failure, unspecified: Secondary | ICD-10-CM | POA: Diagnosis not present

## 2019-12-06 DIAGNOSIS — I10 Essential (primary) hypertension: Secondary | ICD-10-CM

## 2019-12-06 DIAGNOSIS — E785 Hyperlipidemia, unspecified: Secondary | ICD-10-CM | POA: Diagnosis not present

## 2019-12-06 DIAGNOSIS — F329 Major depressive disorder, single episode, unspecified: Secondary | ICD-10-CM | POA: Diagnosis not present

## 2019-12-06 DIAGNOSIS — Z87891 Personal history of nicotine dependence: Secondary | ICD-10-CM | POA: Diagnosis not present

## 2019-12-06 DIAGNOSIS — Z7952 Long term (current) use of systemic steroids: Secondary | ICD-10-CM | POA: Insufficient documentation

## 2019-12-06 DIAGNOSIS — I110 Hypertensive heart disease with heart failure: Secondary | ICD-10-CM | POA: Insufficient documentation

## 2019-12-06 DIAGNOSIS — Z79899 Other long term (current) drug therapy: Secondary | ICD-10-CM | POA: Insufficient documentation

## 2019-12-06 DIAGNOSIS — M8580 Other specified disorders of bone density and structure, unspecified site: Secondary | ICD-10-CM | POA: Diagnosis not present

## 2019-12-06 DIAGNOSIS — I890 Lymphedema, not elsewhere classified: Secondary | ICD-10-CM | POA: Insufficient documentation

## 2019-12-06 DIAGNOSIS — I5022 Chronic systolic (congestive) heart failure: Secondary | ICD-10-CM | POA: Insufficient documentation

## 2019-12-06 DIAGNOSIS — D649 Anemia, unspecified: Secondary | ICD-10-CM | POA: Insufficient documentation

## 2019-12-06 DIAGNOSIS — J449 Chronic obstructive pulmonary disease, unspecified: Secondary | ICD-10-CM | POA: Insufficient documentation

## 2019-12-06 DIAGNOSIS — I252 Old myocardial infarction: Secondary | ICD-10-CM | POA: Diagnosis not present

## 2019-12-06 DIAGNOSIS — I2510 Atherosclerotic heart disease of native coronary artery without angina pectoris: Secondary | ICD-10-CM | POA: Insufficient documentation

## 2019-12-06 DIAGNOSIS — Z20822 Contact with and (suspected) exposure to covid-19: Secondary | ICD-10-CM | POA: Insufficient documentation

## 2019-12-06 DIAGNOSIS — K5090 Crohn's disease, unspecified, without complications: Secondary | ICD-10-CM | POA: Insufficient documentation

## 2019-12-06 MED ORDER — SACUBITRIL-VALSARTAN 24-26 MG PO TABS: 1.0000 | ORAL_TABLET | Freq: Two times a day (BID) | ORAL | 3 refills | Status: DC

## 2019-12-06 NOTE — Patient Instructions (Addendum)
Continue weighing daily and call for an overnight weight gain of > 2 pounds or a weekly weight gain of >5 pounds.   Stop taking lisinopril, skip one day and then begin taking entresto 24/69m twice daily

## 2019-12-06 NOTE — Progress Notes (Signed)
Patient ID: Raven [name], female    DOB: 1934/08/27, 85 y.o.   MRN: [id number]  HPI  Ms Thaker is a 85 y/o female with a history of asthma, hyperlipidemia, HTN, COPD, CAD (MI), anemia, depression, previous tobacco use and chronic heart failure.   Echo report from 08/26/19 reviewed and showed an EF of 40-45% along with mildly elevated PA pressure and moderate MR.   Was in the ED 11/22/2019 for leg swelling and pain. Patient was admitted for CHF exacerbation and was d/c on 11/26/2019. She was given IV lasix to remove excess fluid. Patient had a negative COVID test during admission.  She presents today for her follow up after hospitalization. Patient reports she has been recovering well and starting to feel like herself again, but still having some exertional shortness of breath and legs swelling at times.    Past Medical History:  Diagnosis Date  . Anemia   . Arthritis   . Asthma   . CHF (congestive heart failure) ([place])   . Complication of anesthesia   . COPD (chronic obstructive pulmonary disease) ([place])   . Crohn's disease ([place])   . Depression    after death of husband  . Hyperlipidemia   . Hypertension   . Myocardial infarction ([place])   . Osteopenia   . Peptic ulcer disease   . Pneumonia   . PONV (postoperative nausea and vomiting)    Past Surgical History:  Procedure Laterality Date  . ABDOMINAL SURGERY    . CARDIAC CATHETERIZATION     with stent  . COLONOSCOPY WITH PROPOFOL N/A 09/27/2014   Procedure: COLONOSCOPY WITH PROPOFOL;  Surgeon: [name], MD;  Location: [facility] ENDOSCOPY;  Service: Gastroenterology;  Laterality: N/A;  . COLONOSCOPY WITH PROPOFOL N/A 06/21/2019   Procedure: COLONOSCOPY WITH PROPOFOL;  Surgeon: Lin Landsman, MD;  Location: [facility] ENDOSCOPY;  Service: Gastroenterology;  Laterality: N/A;  . EYE SURGERY     bilateral cataract surgeries  . SHOULDER SURGERY Right    x 2   . SMALL INTESTINE SURGERY     Family History  Problem Relation Age of Onset   . Hypertension Other    Social History   Tobacco Use  . Smoking status: Former Smoker    Quit date: 1974    Years since quitting: 47.7  . Smokeless tobacco: Never Used  Substance Use Topics  . Alcohol use: Yes    Comment: occassional   Allergies  Allergen Reactions  . Penicillins Other (See Comments)    Reaction:  Unknown  Has patient had a PCN reaction causing immediate rash, facial/tongue/throat swelling, SOB or lightheadedness with hypotension: unknown Has patient had a PCN reaction causing severe rash involving mucus membranes or skin necrosis: unknown Has patient had a PCN reaction that required hospitalization No Has patient had a PCN reaction occurring within the last 10 years: No If all of the above answers are "NO", then may proceed with Cephalosporin use.   . Augmentin [Amoxicillin-Pot Clavulanate] Other (See Comments)    Reaction:  Unknown   . Indocin [Indomethacin] Other (See Comments)    Reaction:  Unknown   . Lodine [Etodolac] Other (See Comments)    Reaction:   GI upset   . Methotrexate Derivatives Other (See Comments)    Reaction:  GI upset   . Gold Rash  . Zantac [Ranitidine Hcl] Rash   Prior to Admission medications   Medication Sig Start Date End Date Taking? Authorizing Provider  acetaminophen (TYLENOL) 500 MG tablet  Take 500 mg by mouth every 6 (six) hours as needed.   Yes [provider]  Adalimumab (HUMIRA) 40 MG/0.8ML PSKT Inject 40 mg into the skin every 14 (fourteen) days.    Yes [provider]  albuterol (PROVENTIL HFA;VENTOLIN HFA) 108 (90 BASE) MCG/ACT inhaler Inhale 2 puffs into the lungs every 6 (six) hours as needed for wheezing or shortness of breath.   Yes [provider]  amitriptyline (ELAVIL) 10 MG tablet Take 20 mg by mouth at bedtime.   Yes [provider]  ANORO ELLIPTA 62.5-25 MCG/INH AEPB Inhale 1 puff into the lungs daily. 05/29/19  Yes [provider]  aspirin EC 81 MG tablet Take 81 mg  by mouth daily.   Yes [provider]  atorvastatin (LIPITOR) 80 MG tablet Take 80 mg by mouth at bedtime.    Yes [provider]  Calcium-Phosphorus-Vitamin D (CITRACAL +D3 PO) Take 1 tablet by mouth daily.   Yes [provider]  clonazePAM (KLONOPIN) 0.25 MG disintegrating tablet Take 1 tablet (0.25 mg total) by mouth daily as needed (anxiety). 08/27/19  Yes Rai, Ripudeep K, MD  Cobalamin Combinations (VITAMIN B12-FOLIC ACID) 824-235 MCG TABS Take by mouth daily.   Yes [provider]  esomeprazole (NEXIUM) 40 MG capsule Take 40 mg by mouth daily. 05/29/19  Yes [provider]  furosemide (LASIX) 40 MG tablet Take 1 tablet (40 mg total) by mouth daily. 08/27/19  Yes Rai, Ripudeep K, MD  leflunomide (ARAVA) 10 MG tablet Take 10 mg by mouth daily. 03/29/19  Yes [provider]  lisinopril (ZESTRIL) 2.5 MG tablet Take 1 tablet (2.5 mg total) by mouth daily. 08/28/19  Yes Rai, Ripudeep K, MD  mesalamine (PENTASA) 500 MG CR capsule Take 1,000 mg by mouth 2 (two) times daily.   Yes [provider]  metoprolol succinate (TOPROL-XL) 50 MG 24 hr tablet Take 50 mg by mouth daily.    Yes [provider]  Multiple Vitamin (MULTIVITAMIN WITH MINERALS) TABS tablet Take 1 tablet by mouth daily.   Yes [provider]  nitroGLYCERIN (NITROSTAT) 0.4 MG SL tablet Place 0.4 mg under the tongue every 5 (five) minutes as needed for chest pain.   Yes [provider]  potassium chloride (KLOR-CON) 10 MEQ tablet Take 1 tablet (10 mEq total) by mouth daily. 08/27/19  Yes Rai, Ripudeep K, MD  predniSONE (DELTASONE) 1 MG tablet Take by mouth. Take 2 tablets at bedtime   Yes [provider]  predniSONE (DELTASONE) 5 MG tablet Take 5 mg by mouth daily with breakfast.   Yes [provider]  risedronate (ACTONEL) 150 MG tablet Take 150 mg by mouth every 30 (thirty) days. with water on empty stomach, nothing by mouth or lie down  for next 30 minutes.   Yes [provider]  senna (SENOKOT) 8.6 MG tablet Take 2 tablets by mouth at bedtime.    Yes [provider]  thiamine (VITAMIN B-1) 100 MG tablet Take 100 mg by mouth daily.   Yes [provider]  vitamin E 400 UNIT capsule Take 400 Units by mouth daily.   Yes [provider]     Review of Systems  Constitutional: Positive for fatigue. Negative for appetite change.  HENT: Negative for congestion, postnasal drip and sore throat.   Eyes: Negative.   Respiratory: Positive for cough, shortness of breath and wheezing.   Cardiovascular: Positive for leg swelling.  Gastrointestinal: Negative for abdominal distention.  Endocrine: Negative.  Genitourinary: Negative.   Musculoskeletal: Positive for arthralgias (left wrist). Negative for back pain.  Skin: Negative.   Allergic/Immunologic: Negative.   Neurological: Positive for light-headedness. Negative for dizziness.  Hematological: Negative for adenopathy. Bruises/bleeds easily.  Psychiatric/Behavioral: Positive for sleep disturbance (intermittently; sleeping on 1 pillow). Negative for dysphoric mood. The patient is not nervous/anxious.    Vitals:   12/06/19 1025  BP: (!) 143/68  Pulse: 83  Resp: 18  SpO2: 95%  Weight: 163 lb 8 oz (74.2 kg)  Height: 5' 7"  (1.702 m)   Wt Readings from Last 3 Encounters:  12/06/19 163 lb 8 oz (74.2 kg)  11/26/19 163 lb (73.9 kg)  10/25/19 168 lb (76.2 kg)   Lab Results  Component Value Date   CREATININE 1.21 (H) 11/26/2019   CREATININE 0.95 11/25/2019   CREATININE 0.96 11/24/2019     Physical Exam Vitals and nursing note reviewed.  Constitutional:      Appearance: She is well-developed.  HENT:     Head: Normocephalic and atraumatic.  Neck:     Vascular: No JVD.  Cardiovascular:     Rate and Rhythm: Normal rate and regular rhythm.  Pulmonary:     Effort: Pulmonary effort is normal. No respiratory distress.     Breath sounds: No  wheezing or rales.  Abdominal:     Palpations: Abdomen is soft.     Tenderness: There is no abdominal tenderness.  Musculoskeletal:     Cervical back: Neck supple.     Right lower leg: No tenderness. Edema (1+ pitting) present.     Left lower leg: No tenderness. Edema (1+ pitting) present.  Skin:    General: Skin is warm and dry.  Neurological:     General: No focal deficit present.     Mental Status: She is alert and oriented to person, place, and time.  Psychiatric:        Mood and Affect: Mood normal.        Behavior: Behavior normal.    Assessment & Plan:  1: Chronic heart failure with reduced ejection fraction- - NYHA class II - Trace pitting edema around the ankles - weighing daily; reminded to call for an overnight weight gain of >2 pounds or a weekly weight gain of >5 pounds - Using seasoning mix on foods to reduce use of salt, but reports using "some" salt for her potatoes and tomatoes - saw cardiology ([facility]) 12/04/19 - BNP 11/25/2019 was 967.5 - reports receiving both COVID vaccines -Patient to start on Entresto 24/26 mg BID due to elevated BP. Information for wash out period between lisonopril given and information on first month program reviewed. Patient is agreeable to plan of care.   2: HTN- - BP mildly elevated today - saw PCP Edwina Barth) 12/04/19 - BMP 11/26/19 reviewed and showed sodium 140, potassium 5.1, creatinine 1.21 and GFR 41   3: COPD- - Plan to see Pulmonology Raul Del) 12/07/2019 - wearing oxygen at 1L QHS and PRN during the day - Patient has reportedly gotten her Flu shot for the year  4: Lymphedema- - stage 2 - planning on starting pulmonary rehab soon - elevates her legs but not as consistently as she should; encouraged her to elevate her legs when sitting for long periods of time - has compression socks and she was instructed to put them on every morning with removal at bedtime    Patient utilizes bubble packs from the pharmacy and brought  her medication list. Patient had been previously taking her  lasix x1 daily instead of 2 post hospital stay.   Return in 6 weeks or sooner for any questions/problems before then.

## 2019-12-07 ENCOUNTER — Other Ambulatory Visit: Payer: Self-pay | Admitting: Specialist

## 2019-12-07 DIAGNOSIS — R918 Other nonspecific abnormal finding of lung field: Secondary | ICD-10-CM

## 2019-12-07 DIAGNOSIS — R0609 Other forms of dyspnea: Secondary | ICD-10-CM

## 2020-01-05 ENCOUNTER — Ambulatory Visit: Payer: Medicare Other | Attending: Specialist

## 2020-01-18 ENCOUNTER — Ambulatory Visit: Payer: Medicare Other | Attending: Family | Admitting: Family

## 2020-01-18 ENCOUNTER — Telehealth: Payer: Self-pay | Admitting: Family

## 2020-01-18 ENCOUNTER — Encounter: Payer: Self-pay | Admitting: Family

## 2020-01-18 ENCOUNTER — Other Ambulatory Visit: Payer: Self-pay

## 2020-01-18 VITALS — BP 154/74 | HR 95 | Resp 16 | Ht 65.0 in | Wt 167.5 lb

## 2020-01-18 DIAGNOSIS — Z79899 Other long term (current) drug therapy: Secondary | ICD-10-CM | POA: Insufficient documentation

## 2020-01-18 DIAGNOSIS — Z87891 Personal history of nicotine dependence: Secondary | ICD-10-CM | POA: Diagnosis not present

## 2020-01-18 DIAGNOSIS — J449 Chronic obstructive pulmonary disease, unspecified: Secondary | ICD-10-CM | POA: Diagnosis not present

## 2020-01-18 DIAGNOSIS — I110 Hypertensive heart disease with heart failure: Secondary | ICD-10-CM | POA: Diagnosis not present

## 2020-01-18 DIAGNOSIS — E785 Hyperlipidemia, unspecified: Secondary | ICD-10-CM | POA: Diagnosis not present

## 2020-01-18 DIAGNOSIS — Z7952 Long term (current) use of systemic steroids: Secondary | ICD-10-CM | POA: Diagnosis not present

## 2020-01-18 DIAGNOSIS — I10 Essential (primary) hypertension: Secondary | ICD-10-CM

## 2020-01-18 DIAGNOSIS — Z8249 Family history of ischemic heart disease and other diseases of the circulatory system: Secondary | ICD-10-CM | POA: Diagnosis not present

## 2020-01-18 DIAGNOSIS — I252 Old myocardial infarction: Secondary | ICD-10-CM | POA: Insufficient documentation

## 2020-01-18 DIAGNOSIS — I5022 Chronic systolic (congestive) heart failure: Secondary | ICD-10-CM | POA: Insufficient documentation

## 2020-01-18 DIAGNOSIS — I2510 Atherosclerotic heart disease of native coronary artery without angina pectoris: Secondary | ICD-10-CM | POA: Diagnosis not present

## 2020-01-18 DIAGNOSIS — I890 Lymphedema, not elsewhere classified: Secondary | ICD-10-CM | POA: Diagnosis not present

## 2020-01-18 LAB — BASIC METABOLIC PANEL
Anion gap: 11 (ref 5–15)
BUN: 19 mg/dL (ref 8–23)
CO2: 23 mmol/L (ref 22–32)
Calcium: 8.2 mg/dL — ABNORMAL LOW (ref 8.9–10.3)
Chloride: 108 mmol/L (ref 98–111)
Creatinine, Ser: 0.93 mg/dL (ref 0.44–1.00)
GFR, Estimated: >60 mL/min (ref >60)
Glucose, Bld: 124 mg/dL — ABNORMAL HIGH (ref 70–99)
Potassium: 3.7 mmol/L (ref 3.5–5.1)
Sodium: 142 mmol/L (ref 135–145)

## 2020-01-18 MED ORDER — AZITHROMYCIN 250 MG PO TABS: ORAL_TABLET | ORAL | 0 refills | Status: DC

## 2020-01-18 NOTE — Telephone Encounter (Signed)
Left message regarding lab results obtained earlier today. Potassium and renal function are all normal so she can take an additional 62m furosemide for 2 days as we previously discussed. She is to call back for any questions.

## 2020-01-18 NOTE — Progress Notes (Signed)
Patient ID: Raven [name], female    DOB: [birth year], 85 y.o.   MRN: [id number]  HPI  Raven [name] is a 85 y/o female with a history of asthma, hyperlipidemia, HTN, COPD, CAD (MI), anemia, depression, previous tobacco use and chronic heart failure.   Echo report from 08/26/19 reviewed and showed an EF of 40-45% along with mildly elevated PA pressure and moderate MR.   Was in the ED 09/16/19 due to a mechanical fall after tripping over a garden hose. Laceration noticed above right eyebrow. Head CT negative. Laceration closed. Left wrist xray done due to pain and showed distal wrist fracture. Put in splint and sling and released. Admitted 08/25/19 due to HF/ COPD exacerbation. Cardiology consult obtained. Initially given IV lasix with transition to oral diuretics. Given antibiotics, nebulizers and steroids. Elevated troponin thought to be due to demand ischemia. Discharged after 2 days.   Raven [name] presents today for a follow-up visit with a chief complaint of moderate shortness of breath upon minimal exertion. Raven [name] describes this as chronic in nature having been present for several years although feels like it has worsened since Raven [name] woke up this morning. Raven [name] has associated fatigue, cough, expiratory wheezing, pedal edema, light-headedness, intermittent difficulty sleeping and slight weight gain along with this. Raven [name] denies any abdominal distention, palpitations, chest pain or fever.   Wearing her oxygen at 3L at bedtime and PRN during the day. Did not come with it on because her portable tank was out. Has tolerated the change to entresto without known side effects.   Past Medical History:  Diagnosis Date  . Anemia   . Arthritis   . Asthma   . CHF (congestive heart failure) ([place])   . Complication of anesthesia   . COPD (chronic obstructive pulmonary disease) ([place])   . Crohn's disease ([place])   . Depression    after death of husband  . Hyperlipidemia   . Hypertension   . Myocardial infarction ([place])   .  Osteopenia   . Peptic ulcer disease   . Pneumonia   . PONV (postoperative nausea and vomiting)    Past Surgical History:  Procedure Laterality Date  . ABDOMINAL SURGERY    . CARDIAC CATHETERIZATION     with stent  . COLONOSCOPY WITH PROPOFOL N/A 09/27/2014   Procedure: COLONOSCOPY WITH PROPOFOL;  Surgeon: [name], MD;  Location: [facility] ENDOSCOPY;  Service: Gastroenterology;  Laterality: N/A;  . COLONOSCOPY WITH PROPOFOL N/A 06/21/2019   Procedure: COLONOSCOPY WITH PROPOFOL;  Surgeon: Lin Landsman, MD;  Location: [facility] ENDOSCOPY;  Service: Gastroenterology;  Laterality: N/A;  . EYE SURGERY     bilateral cataract surgeries  . SHOULDER SURGERY Right    x 2   . SMALL INTESTINE SURGERY     Family History  Problem Relation Age of Onset  . Hypertension Other    Social History   Tobacco Use  . Smoking status: Former Smoker    Quit date: 1974    Years since quitting: 47.8  . Smokeless tobacco: Never Used  Substance Use Topics  . Alcohol use: Yes    Comment: occassional   Allergies  Allergen Reactions  . Penicillins Other (See Comments)    Reaction:  Unknown  Has patient had a PCN reaction causing immediate rash, facial/tongue/throat swelling, SOB or lightheadedness with hypotension: unknown Has patient had a PCN reaction causing severe rash involving mucus membranes or skin necrosis: unknown Has patient had a PCN reaction that required hospitalization No Has patient had  a PCN reaction occurring within the last 10 years: No If all of the above answers are "NO", then may proceed with Cephalosporin use.   . Augmentin [Amoxicillin-Pot Clavulanate] Other (See Comments)    Reaction:  Unknown   . Indocin [Indomethacin] Other (See Comments)    Reaction:  Unknown   . Lodine [Etodolac] Other (See Comments)    Reaction:   GI upset   . Methotrexate Derivatives Other (See Comments)    Reaction:  GI upset   . Gold Rash  . Zantac [Ranitidine Hcl] Rash   Prior to Admission  medications   Medication Sig Start Date End Date Taking? Authorizing Provider  acetaminophen (TYLENOL) 500 MG tablet Take 500 mg by mouth every 6 (six) hours as needed.   Yes [provider]  Adalimumab (HUMIRA) 40 MG/0.8ML PSKT Inject 40 mg into the skin every 30 (thirty) days.    Yes [provider]  albuterol (PROVENTIL HFA;VENTOLIN HFA) 108 (90 BASE) MCG/ACT inhaler Inhale 2 puffs into the lungs every 6 (six) hours as needed for wheezing or shortness of breath.   Yes [provider]  amitriptyline (ELAVIL) 10 MG tablet Take 20 mg by mouth at bedtime.   Yes [provider]  ANORO ELLIPTA 62.5-25 MCG/INH AEPB Inhale 1 puff into the lungs daily. 05/29/19  Yes [provider]  aspirin EC 81 MG tablet Take 81 mg by mouth daily.   Yes [provider]  atorvastatin (LIPITOR) 80 MG tablet Take 80 mg by mouth daily.   Yes [provider]  Calcium-Phosphorus-Vitamin D (CITRACAL +D3 PO) Take 1 tablet by mouth daily.   Yes [provider]  clonazePAM (KLONOPIN) 0.25 MG disintegrating tablet Take 1 tablet (0.25 mg total) by mouth daily as needed (anxiety). 08/27/19  Yes Rai, Ripudeep K, MD  Cobalamin Combinations (VITAMIN B12-FOLIC ACID) 621-308 MCG TABS Take by mouth daily.   Yes [provider]  esomeprazole (NEXIUM) 40 MG capsule Take 40 mg by mouth daily. 05/29/19  Yes [provider]  folic acid (FOLVITE) 657 MCG tablet Take 400 mcg by mouth daily.   Yes [provider]  furosemide (LASIX) 40 MG tablet Take 1 tablet (40 mg total) by mouth 2 (two) times daily. 11/26/19  Yes Wieting, Richard, MD  leflunomide (ARAVA) 10 MG tablet Take 10 mg by mouth daily. 03/29/19  Yes [provider]  mesalamine (PENTASA) 500 MG CR capsule Take 1,000 mg by mouth 2 (two) times daily.   Yes [provider]  metoprolol succinate (TOPROL-XL) 50 MG 24 hr tablet Take 50 mg by mouth daily.    Yes [provider]  Multiple Vitamin (MULTIVITAMIN WITH MINERALS) TABS tablet Take 1 tablet by mouth daily.   Yes [provider]  potassium chloride (KLOR-CON) 10 MEQ tablet Take 10 mEq by mouth daily.   Yes [provider]  predniSONE (DELTASONE) 1 MG tablet Take by mouth. Take 2 tablets at bedtime   Yes [provider]  predniSONE (DELTASONE) 5 MG tablet Take 5 mg by mouth daily with breakfast.   Yes [provider]  risedronate (ACTONEL) 150 MG tablet Take 150 mg by mouth every 30 (thirty) days. with water on empty stomach, nothing by mouth or lie down for next 30 minutes.   Yes [provider]  sacubitril-valsartan (ENTRESTO) 24-26 MG Take 1 tablet by mouth 2 (two) times daily. 12/06/19  Yes Derrika Ruffalo A, FNP  senna (SENOKOT) 8.6 MG tablet Take 2 tablets by  mouth at bedtime.    Yes [provider]  thiamine (VITAMIN B-1) 100 MG tablet Take 100 mg by mouth daily.   Yes [provider]  vitamin E 400 UNIT capsule Take 400 Units by mouth daily.   Yes [provider]  azithromycin (ZITHROMAX Z-PAK) 250 MG tablet Take as directed 01/18/20   Alisa Graff, FNP    Review of Systems  Constitutional: Positive for fatigue. Negative for appetite change and fever.  HENT: Negative for congestion, postnasal drip and sore throat.   Eyes: Negative.   Respiratory: Positive for cough, shortness of breath and wheezing ("very little").   Cardiovascular: Positive for leg swelling. Negative for chest pain and palpitations.  Gastrointestinal: Negative for abdominal distention and abdominal pain.  Endocrine: Negative.   Genitourinary: Negative.   Musculoskeletal: Positive for arthralgias (left wrist). Negative for back pain.  Skin: Negative.   Allergic/Immunologic: Negative.   Neurological: Positive for light-headedness. Negative for dizziness.  Hematological: Negative for adenopathy. Bruises/bleeds easily.  Psychiatric/Behavioral: Positive for  sleep disturbance (intermittently; sleeping on 1 pillow). Negative for dysphoric mood. The patient is not nervous/anxious.    Vitals:   01/18/20 1001 01/18/20 1016  BP: (!) 154/74   Pulse: (!) 101 95  Resp: 16   SpO2: 97% 96%  Weight: 167 lb 8 oz (76 kg)   Height: 5' 5"  (1.651 m)    Wt Readings from Last 3 Encounters:  01/18/20 167 lb 8 oz (76 kg)  12/06/19 163 lb 8 oz (74.2 kg)  11/26/19 163 lb (73.9 kg)    Lab Results  Component Value Date   CREATININE 1.21 (H) 11/26/2019   CREATININE 0.95 11/25/2019   CREATININE 0.96 11/24/2019    Physical Exam Vitals and nursing note reviewed.  Constitutional:      Appearance: Raven [name] is well-developed.  HENT:     Head: Normocephalic and atraumatic.  Neck:     Vascular: No JVD.  Cardiovascular:     Rate and Rhythm: Normal rate and regular rhythm.  Pulmonary:     Effort: Pulmonary effort is normal. No respiratory distress.     Breath sounds: Examination of the left-lower field reveals wheezing and rales. Wheezing and rales present.     Comments: Harsh, loose cough Abdominal:     Palpations: Abdomen is soft.     Tenderness: There is no abdominal tenderness.  Musculoskeletal:     Cervical back: Neck supple.     Right lower leg: No tenderness. Edema (1+ pitting) present.     Left lower leg: No tenderness. Edema (1+ pitting) present.  Skin:    General: Skin is warm and dry.  Neurological:     General: No focal deficit present.     Mental Status: Raven [name] is alert and oriented to person, place, and time.  Psychiatric:        Mood and Affect: Mood normal.        Behavior: Behavior normal.    Assessment & Plan:  1: Chronic heart failure with reduced ejection fraction- - NYHA class III - euvolemic today - weighing daily; reminded to call for an overnight weight gain of >2 pounds or a weekly weight gain of >5 pounds - weight up 4 pounds from last visit here 6 weeks ago - using Mrs Deliah [place] seasoning on her foods and tries to look at food  labels - entresto 24/25m started at last visit; will draw BMP today - will call patient after getting lab results back as Raven [name] may need  an extra dose of furosemide for a couple of days - saw cardiology (Paraschos) 09/18/19 - BNP 08/25/19 was 635.0 - reports receiving both COVID vaccines & the flu vaccine for this season  2: HTN- - BP mildly elevated - saw PCP Edwina Barth) 09/22/19 - BMP 12/04/19 reviewed and showed sodium 141, potassium 4.3, creatinine 1.2 and GFR 43  3: COPD- - saw pulmonology Raul Del) 12/07/19 - wearing oxygen at 3L QHS and PRN during the day - due to upcoming weekend and her reported frequent bouts with pneumonia, will send in RX for zpack; Raven [name] was instructed that if her cough was worsening Raven [name] could pick up the antibiotic and start it  4: Lymphedema- - stage 2 - currently not able to exercise much because of her shortness of breath - elevates her legs but not as consistently as Raven [name] should; encouraged her to elevate her legs when sitting for long periods of time - has compression socks and Raven [name] was instructed to put them on every morning with removal at bedtime - consider compression boots if edema persists   Patient did not bring her medications nor a list. Each medication was verbally reviewed with the patient and Raven [name] was encouraged to bring the bottles to every visit to confirm accuracy of list.  Return in 1 month or sooner for any questions/problems before then.

## 2020-01-18 NOTE — Patient Instructions (Addendum)
Continue weighing daily and call for an overnight weight gain of > 2 pounds or a weekly weight gain of >5 pounds.  Antibiotic sent to pharmacy and you can pick it up if you need it

## 2020-02-14 NOTE — Progress Notes (Signed)
Patient ID: Raven [name], female    DOB: [birth year], 85 y.o.   MRN: [id number]  HPI  Raven [name] is a 85 y/o female with a history of asthma, hyperlipidemia, HTN, COPD, CAD (MI), anemia, depression, previous tobacco use and chronic heart failure.   Echo report from 08/26/19 reviewed and showed an EF of 40-45% along with mildly elevated PA pressure and moderate MR.   Was in the ED 09/16/19 due to a mechanical fall after tripping over a garden hose. Laceration noticed above right eyebrow. Head CT negative. Laceration closed. Left wrist xray done due to pain and showed distal wrist fracture. Put in splint and sling and released. Admitted 08/25/19 due to HF/ COPD exacerbation. Cardiology consult obtained. Initially given IV lasix with transition to oral diuretics. Given antibiotics, nebulizers and steroids. Elevated troponin thought to be due to demand ischemia. Discharged after 2 days.   She presents today for a follow-up visit with a chief complaint of mild shortness of breath upon moderate exertion. She describes this as chronic in nature having been present for several years but does report that her breathing has improved since she was last here. She has associated fatigue, cough, pedal edema and chronic difficulty sleeping along with this. She denies any dizziness, abdominal distention, palpitations, chest pain, wheezing or weight gain.   Says that she took the antibiotic that I had prescribed for her and then subsequently called Raven [name] and was prescribed doxycycline which she just finished.   Past Medical History:  Diagnosis Date  . Anemia   . Arthritis   . Asthma   . CHF (congestive heart failure) ([place])   . Complication of anesthesia   . COPD (chronic obstructive pulmonary disease) ([place])   . Crohn's disease ([place])   . Depression    after death of husband  . Hyperlipidemia   . Hypertension   . Myocardial infarction ([place])   . Osteopenia   . Peptic ulcer disease   . Pneumonia    . PONV (postoperative nausea and vomiting)    Past Surgical History:  Procedure Laterality Date  . ABDOMINAL SURGERY    . CARDIAC CATHETERIZATION     with stent  . COLONOSCOPY WITH PROPOFOL N/A 09/27/2014   Procedure: COLONOSCOPY WITH PROPOFOL;  Surgeon: [name], MD;  Location: [facility] ENDOSCOPY;  Service: Gastroenterology;  Laterality: N/A;  . COLONOSCOPY WITH PROPOFOL N/A 06/21/2019   Procedure: COLONOSCOPY WITH PROPOFOL;  Surgeon: Lin Landsman, MD;  Location: [facility] ENDOSCOPY;  Service: Gastroenterology;  Laterality: N/A;  . EYE SURGERY     bilateral cataract surgeries  . SHOULDER SURGERY Right    x 2   . SMALL INTESTINE SURGERY     Family History  Problem Relation Age of Onset  . Hypertension Other    Social History   Tobacco Use  . Smoking status: Former Smoker    Quit date: 1974    Years since quitting: 47.9  . Smokeless tobacco: Never Used  Substance Use Topics  . Alcohol use: Yes    Comment: occassional   Allergies  Allergen Reactions  . Penicillins Other (See Comments)    Reaction:  Unknown  Has patient had a PCN reaction causing immediate rash, facial/tongue/throat swelling, SOB or lightheadedness with hypotension: unknown Has patient had a PCN reaction causing severe rash involving mucus membranes or skin necrosis: unknown Has patient had a PCN reaction that required hospitalization No Has patient had a PCN reaction occurring within the last 10 years:  No If all of the above answers are "NO", then may proceed with Cephalosporin use.   . Augmentin [Amoxicillin-Pot Clavulanate] Other (See Comments)    Reaction:  Unknown   . Indocin [Indomethacin] Other (See Comments)    Reaction:  Unknown   . Lodine [Etodolac] Other (See Comments)    Reaction:   GI upset   . Methotrexate Derivatives Other (See Comments)    Reaction:  GI upset   . Gold Rash  . Zantac [Ranitidine Hcl] Rash   Prior to Admission medications   Medication Sig Start Date End Date Taking?  Authorizing Provider  acetaminophen (TYLENOL) 500 MG tablet Take 500 mg by mouth every 6 (six) hours as needed.   Yes [provider]  Adalimumab 40 MG/0.8ML PSKT Inject 40 mg into the skin every 30 (thirty) days.    Yes [provider]  albuterol (PROVENTIL HFA;VENTOLIN HFA) 108 (90 BASE) MCG/ACT inhaler Inhale 2 puffs into the lungs every 6 (six) hours as needed for wheezing or shortness of breath.   Yes [provider]  amitriptyline (ELAVIL) 10 MG tablet Take 20 mg by mouth at bedtime.   Yes [provider]  ANORO ELLIPTA 62.5-25 MCG/INH AEPB Inhale 1 puff into the lungs daily. 05/29/19  Yes [provider]  aspirin EC 81 MG tablet Take 81 mg by mouth daily.   Yes [provider]  atorvastatin (LIPITOR) 80 MG tablet Take 80 mg by mouth daily.   Yes [provider]  azithromycin (ZITHROMAX Z-PAK) 250 MG tablet Take as directed 01/18/20  Yes Marabella Popiel A, FNP  Calcium-Phosphorus-Vitamin D (CITRACAL +D3 PO) Take 1 tablet by mouth daily.   Yes [provider]  clonazePAM (KLONOPIN) 0.25 MG disintegrating tablet Take 1 tablet (0.25 mg total) by mouth daily as needed (anxiety). 08/27/19  Yes Rai, Ripudeep K, MD  Cobalamin Combinations (VITAMIN B12-FOLIC ACID) 161-096 MCG TABS Take by mouth daily.   Yes [provider]  esomeprazole (NEXIUM) 40 MG capsule Take 40 mg by mouth daily. 05/29/19  Yes [provider]  folic acid (FOLVITE) 045 MCG tablet Take 400 mcg by mouth daily.   Yes [provider]  furosemide (LASIX) 40 MG tablet Take 1 tablet (40 mg total) by mouth 2 (two) times daily. 11/26/19  Yes Wieting, Richard, MD  leflunomide (ARAVA) 10 MG tablet Take 10 mg by mouth daily. 03/29/19  Yes [provider]  mesalamine (PENTASA) 500 MG CR capsule Take 1,000 mg by mouth 2 (two) times daily.   Yes [provider]  metoprolol succinate (TOPROL-XL) 50 MG 24 hr tablet Take 50 mg by mouth  daily.    Yes [provider]  Multiple Vitamin (MULTIVITAMIN WITH MINERALS) TABS tablet Take 1 tablet by mouth daily.   Yes [provider]  potassium chloride (KLOR-CON) 10 MEQ tablet Take 10 mEq by mouth daily.   Yes [provider]  predniSONE (DELTASONE) 1 MG tablet Take by mouth. Take 2 tablets at bedtime   Yes [provider]  predniSONE (DELTASONE) 5 MG tablet Take 5 mg by mouth daily with breakfast.   Yes [provider]  risedronate (ACTONEL) 150 MG tablet Take 150 mg by mouth every 30 (thirty) days. with water on empty stomach, nothing by mouth or lie down for next 30 minutes.   Yes [provider]  sacubitril-valsartan (ENTRESTO) 24-26 MG Take 1 tablet by mouth 2 (two) times daily. 12/06/19  Yes Raven Graff, FNP  senna Donavan Burnet)  8.6 MG tablet Take 2 tablets by mouth at bedtime.   Yes [provider]  thiamine (VITAMIN B-1) 100 MG tablet Take 100 mg by mouth daily.   Yes [provider]  vitamin E 400 UNIT capsule Take 400 Units by mouth daily.   Yes [provider]    Review of Systems  Constitutional: Positive for fatigue. Negative for appetite change and fever.  HENT: Negative for congestion, postnasal drip and sore throat.   Eyes: Negative.   Respiratory: Positive for cough and shortness of breath. Negative for wheezing.   Cardiovascular: Positive for leg swelling ("improving"). Negative for chest pain and palpitations.  Gastrointestinal: Negative for abdominal distention and abdominal pain.  Endocrine: Negative.   Genitourinary: Negative.   Musculoskeletal: Positive for arthralgias (left wrist). Negative for back pain.  Skin: Negative.   Allergic/Immunologic: Negative.   Neurological: Negative for dizziness and light-headedness.  Hematological: Negative for adenopathy. Bruises/bleeds easily.  Psychiatric/Behavioral: Positive for sleep disturbance (intermittently; sleeping on 1 pillow).  Negative for dysphoric mood. The patient is not nervous/anxious.    Vitals:   02/15/20 1027  BP: 136/62  Pulse: 84  Resp: 16  SpO2: 96%  Weight: 165 lb 2 oz (74.9 kg)  Height: 5' 5"  (1.651 m)   Wt Readings from Last 3 Encounters:  02/15/20 165 lb 2 oz (74.9 kg)  01/18/20 167 lb 8 oz (76 kg)  12/06/19 163 lb 8 oz (74.2 kg)   Lab Results  Component Value Date   CREATININE 0.93 01/18/2020   CREATININE 1.21 (H) 11/26/2019   CREATININE 0.95 11/25/2019    Physical Exam Vitals and nursing note reviewed.  Constitutional:      Appearance: She is well-developed.  HENT:     Head: Normocephalic and atraumatic.  Neck:     Vascular: No JVD.  Cardiovascular:     Rate and Rhythm: Normal rate and regular rhythm.  Pulmonary:     Effort: Pulmonary effort is normal. No respiratory distress.     Breath sounds: No wheezing or rales.  Abdominal:     Palpations: Abdomen is soft.     Tenderness: There is no abdominal tenderness.  Musculoskeletal:        General: No tenderness.     Cervical back: Neck supple.     Right lower leg: No tenderness. Edema (trace pitting) present.     Left lower leg: No tenderness. Edema (trace pitting) present.  Skin:    General: Skin is warm and dry.  Neurological:     General: No focal deficit present.     Mental Status: She is alert and oriented to person, place, and time.  Psychiatric:        Mood and Affect: Mood normal.        Behavior: Behavior normal.    Assessment & Plan:  1: Chronic heart failure with reduced ejection fraction- - NYHA class II - euvolemic today - weighing daily; reminded to call for an overnight weight gain of >2 pounds or a weekly weight gain of >5 pounds - weight down 2 pounds from last visit here 1 month ago - using Raven [name] seasoning on her foods and tries to look at food labels - saw cardiology (Raven [name]) 09/18/19 - BNP 11/25/19 was 967.5 - reports receiving both COVID vaccines & the flu vaccine for this season  2:  HTN- - BP looks good today - saw PCP Raven [name]) 09/22/19 - BMP 01/18/20 reviewed and showed sodium 142, potassium 3.7, creatinine 0.93 and GFR >  60  3: COPD- - saw pulmonology Raven [name]) 12/07/19 - wearing oxygen at 3L QHS and PRN during the day  4: Lymphedema- - stage 2 - currently not able to exercise much  - trying to elevate her legs more often with improvement in her swelling - has compression socks and she was instructed to put them on every morning with removal at bedtime - consider compression boots if edema persists   Medication bubble packs were reviewed.   Return in 6 months or sooner for any questions/problems before then.

## 2020-02-15 ENCOUNTER — Encounter: Payer: Self-pay | Admitting: Family

## 2020-02-15 ENCOUNTER — Other Ambulatory Visit: Payer: Self-pay

## 2020-02-15 ENCOUNTER — Ambulatory Visit: Payer: Medicare Other | Attending: Family | Admitting: Family

## 2020-02-15 VITALS — BP 136/62 | HR 84 | Resp 16 | Ht 65.0 in | Wt 165.1 lb

## 2020-02-15 DIAGNOSIS — Z7982 Long term (current) use of aspirin: Secondary | ICD-10-CM | POA: Diagnosis not present

## 2020-02-15 DIAGNOSIS — Z8249 Family history of ischemic heart disease and other diseases of the circulatory system: Secondary | ICD-10-CM | POA: Insufficient documentation

## 2020-02-15 DIAGNOSIS — Z79899 Other long term (current) drug therapy: Secondary | ICD-10-CM | POA: Insufficient documentation

## 2020-02-15 DIAGNOSIS — I110 Hypertensive heart disease with heart failure: Secondary | ICD-10-CM | POA: Diagnosis not present

## 2020-02-15 DIAGNOSIS — I890 Lymphedema, not elsewhere classified: Secondary | ICD-10-CM | POA: Diagnosis not present

## 2020-02-15 DIAGNOSIS — Z7952 Long term (current) use of systemic steroids: Secondary | ICD-10-CM | POA: Insufficient documentation

## 2020-02-15 DIAGNOSIS — I509 Heart failure, unspecified: Secondary | ICD-10-CM | POA: Diagnosis present

## 2020-02-15 DIAGNOSIS — E785 Hyperlipidemia, unspecified: Secondary | ICD-10-CM | POA: Diagnosis not present

## 2020-02-15 DIAGNOSIS — J449 Chronic obstructive pulmonary disease, unspecified: Secondary | ICD-10-CM | POA: Insufficient documentation

## 2020-02-15 DIAGNOSIS — I252 Old myocardial infarction: Secondary | ICD-10-CM | POA: Insufficient documentation

## 2020-02-15 DIAGNOSIS — I5022 Chronic systolic (congestive) heart failure: Secondary | ICD-10-CM

## 2020-02-15 DIAGNOSIS — Z87891 Personal history of nicotine dependence: Secondary | ICD-10-CM | POA: Diagnosis not present

## 2020-02-15 DIAGNOSIS — I10 Essential (primary) hypertension: Secondary | ICD-10-CM

## 2020-02-15 DIAGNOSIS — I2510 Atherosclerotic heart disease of native coronary artery without angina pectoris: Secondary | ICD-10-CM | POA: Insufficient documentation

## 2020-02-15 NOTE — Patient Instructions (Signed)
Continue weighing daily and call for an overnight weight gain of > 2 pounds or a weekly weight gain of >5 pounds. 

## 2020-02-29 ENCOUNTER — Other Ambulatory Visit: Payer: Self-pay | Admitting: Family

## 2020-03-08 ENCOUNTER — Other Ambulatory Visit: Payer: Self-pay

## 2020-03-08 ENCOUNTER — Inpatient Hospital Stay
Admission: EM | Admit: 2020-03-08 | Discharge: 2020-03-11 | DRG: 291 | Disposition: A | Discharge status: Home Health | Payer: Medicare Other | Attending: Internal Medicine | Admitting: Internal Medicine

## 2020-03-08 ENCOUNTER — Emergency Department: Payer: Medicare Other

## 2020-03-08 ENCOUNTER — Encounter: Payer: Self-pay | Admitting: Emergency Medicine

## 2020-03-08 DIAGNOSIS — I2510 Atherosclerotic heart disease of native coronary artery without angina pectoris: Secondary | ICD-10-CM | POA: Diagnosis present

## 2020-03-08 DIAGNOSIS — J9611 Chronic respiratory failure with hypoxia: Secondary | ICD-10-CM | POA: Diagnosis present

## 2020-03-08 DIAGNOSIS — K529 Noninfective gastroenteritis and colitis, unspecified: Secondary | ICD-10-CM | POA: Diagnosis present

## 2020-03-08 DIAGNOSIS — Z880 Allergy status to penicillin: Secondary | ICD-10-CM

## 2020-03-08 DIAGNOSIS — Z20822 Contact with and (suspected) exposure to covid-19: Secondary | ICD-10-CM | POA: Diagnosis present

## 2020-03-08 DIAGNOSIS — J449 Chronic obstructive pulmonary disease, unspecified: Secondary | ICD-10-CM | POA: Diagnosis present

## 2020-03-08 DIAGNOSIS — Z79899 Other long term (current) drug therapy: Secondary | ICD-10-CM

## 2020-03-08 DIAGNOSIS — E785 Hyperlipidemia, unspecified: Secondary | ICD-10-CM | POA: Diagnosis present

## 2020-03-08 DIAGNOSIS — K219 Gastro-esophageal reflux disease without esophagitis: Secondary | ICD-10-CM | POA: Diagnosis present

## 2020-03-08 DIAGNOSIS — Z6826 Body mass index (BMI) 26.0-26.9, adult: Secondary | ICD-10-CM

## 2020-03-08 DIAGNOSIS — R778 Other specified abnormalities of plasma proteins: Secondary | ICD-10-CM | POA: Diagnosis not present

## 2020-03-08 DIAGNOSIS — R112 Nausea with vomiting, unspecified: Secondary | ICD-10-CM

## 2020-03-08 DIAGNOSIS — F418 Other specified anxiety disorders: Secondary | ICD-10-CM | POA: Diagnosis present

## 2020-03-08 DIAGNOSIS — Z7952 Long term (current) use of systemic steroids: Secondary | ICD-10-CM

## 2020-03-08 DIAGNOSIS — I5023 Acute on chronic systolic (congestive) heart failure: Secondary | ICD-10-CM | POA: Diagnosis present

## 2020-03-08 DIAGNOSIS — I110 Hypertensive heart disease with heart failure: Principal | ICD-10-CM | POA: Diagnosis present

## 2020-03-08 DIAGNOSIS — T502X5A Adverse effect of carbonic-anhydrase inhibitors, benzothiadiazides and other diuretics, initial encounter: Secondary | ICD-10-CM | POA: Diagnosis present

## 2020-03-08 DIAGNOSIS — I252 Old myocardial infarction: Secondary | ICD-10-CM

## 2020-03-08 DIAGNOSIS — E876 Hypokalemia: Secondary | ICD-10-CM | POA: Diagnosis present

## 2020-03-08 DIAGNOSIS — R630 Anorexia: Secondary | ICD-10-CM | POA: Diagnosis present

## 2020-03-08 DIAGNOSIS — Z87891 Personal history of nicotine dependence: Secondary | ICD-10-CM

## 2020-03-08 DIAGNOSIS — R7401 Elevation of levels of liver transaminase levels: Secondary | ICD-10-CM | POA: Diagnosis present

## 2020-03-08 DIAGNOSIS — Z888 Allergy status to other drugs, medicaments and biological substances status: Secondary | ICD-10-CM

## 2020-03-08 DIAGNOSIS — R197 Diarrhea, unspecified: Secondary | ICD-10-CM | POA: Diagnosis present

## 2020-03-08 DIAGNOSIS — I10 Essential (primary) hypertension: Secondary | ICD-10-CM | POA: Diagnosis present

## 2020-03-08 DIAGNOSIS — Y929 Unspecified place or not applicable: Secondary | ICD-10-CM

## 2020-03-08 DIAGNOSIS — K5090 Crohn's disease, unspecified, without complications: Secondary | ICD-10-CM | POA: Diagnosis present

## 2020-03-08 DIAGNOSIS — Z8249 Family history of ischemic heart disease and other diseases of the circulatory system: Secondary | ICD-10-CM

## 2020-03-08 DIAGNOSIS — I429 Cardiomyopathy, unspecified: Secondary | ICD-10-CM | POA: Diagnosis present

## 2020-03-08 DIAGNOSIS — I248 Other forms of acute ischemic heart disease: Secondary | ICD-10-CM | POA: Diagnosis present

## 2020-03-08 DIAGNOSIS — M069 Rheumatoid arthritis, unspecified: Secondary | ICD-10-CM | POA: Diagnosis present

## 2020-03-08 DIAGNOSIS — Z7982 Long term (current) use of aspirin: Secondary | ICD-10-CM

## 2020-03-08 DIAGNOSIS — R0600 Dyspnea, unspecified: Secondary | ICD-10-CM | POA: Diagnosis present

## 2020-03-08 LAB — URINALYSIS, COMPLETE (UACMP) WITH MICROSCOPIC
Bilirubin Urine: NEGATIVE
Glucose, UA: NEGATIVE mg/dL
Hgb urine dipstick: NEGATIVE
Ketones, ur: 20 mg/dL — AB
Leukocytes,Ua: NEGATIVE
Nitrite: NEGATIVE
Protein, ur: NEGATIVE mg/dL
Specific Gravity, Urine: 1.041 — ABNORMAL HIGH (ref 1.005–1.030)
pH: 5 (ref 5.0–8.0)

## 2020-03-08 LAB — COMPREHENSIVE METABOLIC PANEL
ALT: 32 U/L (ref 0–44)
AST: 28 U/L (ref 15–41)
Albumin: 3.4 g/dL — ABNORMAL LOW (ref 3.5–5.0)
Alkaline Phosphatase: 76 U/L (ref 38–126)
Anion gap: 10 (ref 5–15)
BUN: 19 mg/dL (ref 8–23)
CO2: 26 mmol/L (ref 22–32)
Calcium: 7.3 mg/dL — ABNORMAL LOW (ref 8.9–10.3)
Chloride: 109 mmol/L (ref 98–111)
Creatinine, Ser: 0.84 mg/dL (ref 0.44–1.00)
GFR, Estimated: >60 mL/min (ref >60)
Glucose, Bld: 71 mg/dL (ref 70–99)
Potassium: 2.6 mmol/L — CL (ref 3.5–5.1)
Sodium: 145 mmol/L (ref 135–145)
Total Bilirubin: 1.1 mg/dL (ref 0.3–1.2)
Total Protein: 5.7 g/dL — ABNORMAL LOW (ref 6.5–8.1)

## 2020-03-08 LAB — PHOSPHORUS: Phosphorus: 2.8 mg/dL (ref 2.5–4.6)

## 2020-03-08 LAB — CBC
HCT: 38.5 % (ref 36.0–46.0)
Hemoglobin: 12.4 g/dL (ref 12.0–15.0)
MCH: 30.6 pg (ref 26.0–34.0)
MCHC: 32.2 g/dL (ref 30.0–36.0)
MCV: 95.1 fL (ref 80.0–100.0)
Platelets: 213 10*3/uL (ref 150–400)
RBC: 4.05 MIL/uL (ref 3.87–5.11)
RDW: 15.1 % (ref 11.5–15.5)
WBC: 9.1 10*3/uL (ref 4.0–10.5)
nRBC: 0.2 % (ref 0.0–0.2)

## 2020-03-08 LAB — RESP PANEL BY RT-PCR (FLU A&B, COVID) ARPGX2
Influenza A by PCR: NEGATIVE
Influenza B by PCR: NEGATIVE
SARS Coronavirus 2 by RT PCR: NEGATIVE

## 2020-03-08 LAB — TROPONIN I (HIGH SENSITIVITY)
Troponin I (High Sensitivity): 259 ng/L (ref <18)
Troponin I (High Sensitivity): 240 ng/L (ref <18)
Troponin I (High Sensitivity): 224 ng/L (ref <18)
Troponin I (High Sensitivity): 213 ng/L (ref <18)

## 2020-03-08 LAB — LIPASE, BLOOD: Lipase: 22 U/L (ref 11–51)

## 2020-03-08 LAB — BRAIN NATRIURETIC PEPTIDE: B Natriuretic Peptide: 501.8 pg/mL — ABNORMAL HIGH (ref 0.0–100.0)

## 2020-03-08 LAB — MAGNESIUM: Magnesium: 1.7 mg/dL (ref 1.7–2.4)

## 2020-03-08 MED ORDER — SODIUM CHLORIDE 0.9 % IV BOLUS
500.0000 mL | Freq: Once | INTRAVENOUS | Status: AC
  Administered 2020-03-08: 500 mL via INTRAVENOUS

## 2020-03-08 MED ORDER — ASPIRIN EC 81 MG PO TBEC
81.0000 mg | DELAYED_RELEASE_TABLET | Freq: Every day | ORAL | Status: DC
  Administered 2020-03-09 – 2020-03-11 (×3): 81 mg via ORAL
  Filled 2020-03-08 (×3): qty 1

## 2020-03-08 MED ORDER — DM-GUAIFENESIN ER 30-600 MG PO TB12: 1.0000 | ORAL_TABLET | Freq: Two times a day (BID) | ORAL | Status: DC | PRN

## 2020-03-08 MED ORDER — LEFLUNOMIDE 20 MG PO TABS
10.0000 mg | ORAL_TABLET | Freq: Every day | ORAL | Status: DC
  Administered 2020-03-08 – 2020-03-11 (×4): 10 mg via ORAL
  Filled 2020-03-08 (×4): qty 0.5

## 2020-03-08 MED ORDER — ACETAMINOPHEN 325 MG PO TABS
650.0000 mg | ORAL_TABLET | Freq: Four times a day (QID) | ORAL | Status: DC | PRN
  Administered 2020-03-10: 650 mg via ORAL
  Filled 2020-03-08: qty 2

## 2020-03-08 MED ORDER — CALCIUM-PHOSPHORUS-VITAMIN D 250-107-500 MG-MG-UNIT PO CHEW: CHEWABLE_TABLET | Freq: Every day | ORAL | Status: DC

## 2020-03-08 MED ORDER — AMITRIPTYLINE HCL 10 MG PO TABS
20.0000 mg | ORAL_TABLET | Freq: Every day | ORAL | Status: DC
  Administered 2020-03-08 – 2020-03-10 (×3): 20 mg via ORAL
  Filled 2020-03-08 (×6): qty 2

## 2020-03-08 MED ORDER — CALCIUM CARBONATE ANTACID 500 MG PO CHEW
200.0000 mg | CHEWABLE_TABLET | Freq: Every day | ORAL | Status: DC
  Administered 2020-03-10: 09:00:00 200 mg via ORAL
  Filled 2020-03-08 (×3): qty 1

## 2020-03-08 MED ORDER — POTASSIUM CHLORIDE 10 MEQ/100ML IV SOLN
10.0000 meq | Freq: Once | INTRAVENOUS | Status: AC
  Administered 2020-03-08: 10 meq via INTRAVENOUS

## 2020-03-08 MED ORDER — SACUBITRIL-VALSARTAN 24-26 MG PO TABS
1.0000 | ORAL_TABLET | Freq: Two times a day (BID) | ORAL | Status: DC
  Administered 2020-03-08 – 2020-03-11 (×6): 1 via ORAL
  Filled 2020-03-08 (×8): qty 1

## 2020-03-08 MED ORDER — ONDANSETRON HCL 4 MG/2ML IJ SOLN
INTRAMUSCULAR | Status: AC
  Administered 2020-03-08: 4 mg via INTRAVENOUS
  Filled 2020-03-08: qty 2

## 2020-03-08 MED ORDER — HYDROCORTISONE NA SUCCINATE PF 100 MG IJ SOLR
50.0000 mg | Freq: Once | INTRAMUSCULAR | Status: AC
  Administered 2020-03-08: 50 mg via INTRAVENOUS
  Filled 2020-03-08: qty 2

## 2020-03-08 MED ORDER — VITAMIN B12-FOLIC ACID 500-400 MCG PO TABS: 1.0000 | ORAL_TABLET | ORAL | Status: DC

## 2020-03-08 MED ORDER — METOPROLOL SUCCINATE ER 50 MG PO TB24
50.0000 mg | ORAL_TABLET | Freq: Every day | ORAL | Status: DC
  Administered 2020-03-08 – 2020-03-11 (×4): 50 mg via ORAL
  Filled 2020-03-08 (×4): qty 1

## 2020-03-08 MED ORDER — PANTOPRAZOLE SODIUM 40 MG PO TBEC
40.0000 mg | DELAYED_RELEASE_TABLET | Freq: Every day | ORAL | Status: DC
  Administered 2020-03-09 – 2020-03-11 (×3): 40 mg via ORAL
  Filled 2020-03-08 (×3): qty 1

## 2020-03-08 MED ORDER — MESALAMINE ER 250 MG PO CPCR
1000.0000 mg | ORAL_CAPSULE | Freq: Two times a day (BID) | ORAL | Status: DC
  Administered 2020-03-08: 1000 mg via ORAL
  Administered 2020-03-09: 500 mg via ORAL
  Filled 2020-03-08 (×3): qty 4

## 2020-03-08 MED ORDER — CHOLECALCIFEROL 10 MCG (400 UNIT) PO TABS
400.0000 [IU] | ORAL_TABLET | Freq: Every day | ORAL | Status: DC
  Administered 2020-03-10: 400 [IU] via ORAL
  Filled 2020-03-08 (×4): qty 1

## 2020-03-08 MED ORDER — HYDRALAZINE HCL 20 MG/ML IJ SOLN: 5.0000 mg | INTRAMUSCULAR | Status: DC | PRN

## 2020-03-08 MED ORDER — FUROSEMIDE 10 MG/ML IJ SOLN
40.0000 mg | Freq: Two times a day (BID) | INTRAMUSCULAR | Status: DC
  Administered 2020-03-08 – 2020-03-09 (×2): 40 mg via INTRAVENOUS
  Filled 2020-03-08 (×2): qty 4

## 2020-03-08 MED ORDER — POTASSIUM CHLORIDE 10 MEQ/100ML IV SOLN
10.0000 meq | Freq: Once | INTRAVENOUS | Status: AC
  Administered 2020-03-08: 10 meq via INTRAVENOUS
  Filled 2020-03-08: qty 100

## 2020-03-08 MED ORDER — FOLIC ACID 1 MG PO TABS: 500.0000 ug | ORAL_TABLET | Freq: Every day | ORAL | Status: DC

## 2020-03-08 MED ORDER — POTASSIUM CHLORIDE CRYS ER 20 MEQ PO TBCR
40.0000 meq | EXTENDED_RELEASE_TABLET | ORAL | Status: AC
  Administered 2020-03-08: 40 meq via ORAL
  Filled 2020-03-08 (×2): qty 2

## 2020-03-08 MED ORDER — ADULT MULTIVITAMIN W/MINERALS CH
1.0000 | ORAL_TABLET | Freq: Every day | ORAL | Status: DC
  Filled 2020-03-08 (×3): qty 1

## 2020-03-08 MED ORDER — DOXYCYCLINE HYCLATE 100 MG PO TABS
100.0000 mg | ORAL_TABLET | Freq: Two times a day (BID) | ORAL | Status: DC
  Administered 2020-03-08 – 2020-03-11 (×6): 100 mg via ORAL
  Filled 2020-03-08 (×6): qty 1

## 2020-03-08 MED ORDER — VITAMIN E 45 MG (100 UNIT) PO CAPS
400.0000 [IU] | ORAL_CAPSULE | Freq: Every day | ORAL | Status: DC
  Filled 2020-03-08 (×4): qty 4

## 2020-03-08 MED ORDER — MAGNESIUM SULFATE IN D5W 1-5 GM/100ML-% IV SOLN
1.0000 g | Freq: Once | INTRAVENOUS | Status: AC
  Administered 2020-03-08: 1 g via INTRAVENOUS
  Filled 2020-03-08: qty 100

## 2020-03-08 MED ORDER — ONDANSETRON HCL 4 MG/2ML IJ SOLN
4.0000 mg | Freq: Three times a day (TID) | INTRAMUSCULAR | Status: DC | PRN
  Administered 2020-03-09: 4 mg via INTRAVENOUS
  Filled 2020-03-08: qty 2

## 2020-03-08 MED ORDER — ONDANSETRON HCL 4 MG/2ML IJ SOLN: 4.0000 mg | Freq: Once | INTRAMUSCULAR | Status: AC

## 2020-03-08 MED ORDER — THIAMINE HCL 100 MG PO TABS
100.0000 mg | ORAL_TABLET | Freq: Every day | ORAL | Status: DC
  Administered 2020-03-08: 100 mg via ORAL
  Filled 2020-03-08 (×3): qty 1

## 2020-03-08 MED ORDER — PREDNISONE 10 MG PO TABS
15.0000 mg | ORAL_TABLET | Freq: Every day | ORAL | Status: DC
  Administered 2020-03-09 – 2020-03-11 (×3): 15 mg via ORAL
  Filled 2020-03-08 (×3): qty 2

## 2020-03-08 MED ORDER — ALBUTEROL SULFATE (2.5 MG/3ML) 0.083% IN NEBU: 2.5000 mg | INHALATION_SOLUTION | RESPIRATORY_TRACT | Status: DC | PRN

## 2020-03-08 MED ORDER — ATORVASTATIN CALCIUM 20 MG PO TABS
80.0000 mg | ORAL_TABLET | Freq: Every day | ORAL | Status: DC
  Administered 2020-03-09 – 2020-03-11 (×3): 80 mg via ORAL
  Filled 2020-03-08 (×3): qty 4

## 2020-03-08 MED ORDER — ENOXAPARIN SODIUM 40 MG/0.4ML ~~LOC~~ SOLN
40.0000 mg | SUBCUTANEOUS | Status: DC
  Administered 2020-03-08 – 2020-03-10 (×3): 40 mg via SUBCUTANEOUS
  Filled 2020-03-08 (×3): qty 0.4

## 2020-03-08 MED ORDER — CYANOCOBALAMIN 500 MCG PO TABS
500.0000 ug | ORAL_TABLET | Freq: Every day | ORAL | Status: DC
  Filled 2020-03-08 (×4): qty 1

## 2020-03-08 MED ORDER — FOLIC ACID 1 MG PO TABS
1.0000 mg | ORAL_TABLET | Freq: Every day | ORAL | Status: DC
  Filled 2020-03-08 (×3): qty 1

## 2020-03-08 MED ORDER — UMECLIDINIUM-VILANTEROL 62.5-25 MCG/INH IN AEPB
1.0000 | INHALATION_SPRAY | Freq: Every day | RESPIRATORY_TRACT | Status: DC
  Administered 2020-03-08 – 2020-03-11 (×3): 1 via RESPIRATORY_TRACT
  Filled 2020-03-08 (×2): qty 14

## 2020-03-08 MED ORDER — IOHEXOL 300 MG/ML  SOLN
100.0000 mL | Freq: Once | INTRAMUSCULAR | Status: AC | PRN
  Administered 2020-03-08: 100 mL via INTRAVENOUS

## 2020-03-08 NOTE — ED Notes (Signed)
Pt cleansed of small amount of liquid stool.

## 2020-03-08 NOTE — ED Provider Notes (Signed)
[facility] Emergency Department Provider Note ____________________________________________   Event Date/Time   First MD Initiated Contact with Patient 03/08/20 [phone]     (approximate)  I have reviewed the triage vital signs and the nursing notes.   HISTORY  Chief Complaint Nausea and Vomiting    HPI Raven [name] is a 85 y.o. female with PMH as noted below including COPD and CHF menses on 1 to 2 L home O2 at baseline) who presents with nausea over the last 3 days, persistent course, and associated with inability to take anything by mouth.  The patient denies any active vomiting.  She has had some loose stools but no blood in the stool.  She denies any abdominal pain.  She has no fever or chills.  The patient does report some increased shortness of breath and cough.  She was supposed to be on a steroid taper for COPD but has not been able to take the steroid over the last 3 days due to the nausea.  Past Medical History:  Diagnosis Date  . Anemia   . Arthritis   . Asthma   . CHF (congestive heart failure) ([place])   . Complication of anesthesia   . COPD (chronic obstructive pulmonary disease) ([place])   . Crohn's disease ([place])   . Depression    after death of husband  . Hyperlipidemia   . Hypertension   . Myocardial infarction ([place])   . Osteopenia   . Peptic ulcer disease   . Pneumonia   . PONV (postoperative nausea and vomiting)     Patient Active Problem List   Diagnosis Date Noted  . Hyperkalemia   . Hypomagnesemia   . Pain in both lower extremities   . Weakness   . Rheumatoid arthritis ([place])   . Acute on chronic systolic CHF (congestive heart failure) ([place]) 11/22/2019  . Acute on chronic systolic (congestive) heart failure ([place]) 11/22/2019  . Chronic respiratory failure with hypoxia ([place]) 11/22/2019  . HLD (hyperlipidemia) 08/25/2019  . HTN (hypertension) 08/25/2019  . Depression   . CAD (coronary artery disease)   . Elevated troponin   .  Hypokalemia   . Crohn's disease ([place])   . Acute on chronic respiratory failure with hypoxia ([place])   . 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 SBO (small bowel obstruction) ([place]) 06/17/2019  . Crohn's disease of small intestine with intestinal obstruction ([place])   . COPD exacerbation ([place]) 10/20/2018  . COPD (chronic obstructive pulmonary disease) ([place]) 04/22/2016  . Acute bronchitis 04/17/2016  . Sepsis ([place]) 04/17/2016  . Peroneal tendonitis 07/18/2015  . Pneumonia 04/19/2015  . CAP (community acquired pneumonia) 11/02/2014    Past Surgical History:  Procedure Laterality Date  . ABDOMINAL SURGERY    . CARDIAC CATHETERIZATION     with stent  . COLONOSCOPY WITH PROPOFOL N/A 09/27/2014   Procedure: COLONOSCOPY WITH PROPOFOL;  Surgeon: [name], MD;  Location: [facility] ENDOSCOPY;  Service: Gastroenterology;  Laterality: N/A;  . COLONOSCOPY WITH PROPOFOL N/A 06/21/2019   Procedure: COLONOSCOPY WITH PROPOFOL;  Surgeon: Lin Landsman, MD;  Location: [facility] ENDOSCOPY;  Service: Gastroenterology;  Laterality: N/A;  . EYE SURGERY     bilateral cataract surgeries  . SHOULDER SURGERY Right    x 2   . SMALL INTESTINE SURGERY      Prior to Admission medications   Medication Sig Start Date End Date Taking? Authorizing Provider  acetaminophen (TYLENOL) 500 MG tablet Take 500 mg by mouth every 6 (six)  hours as needed.    [provider]  Adalimumab 40 MG/0.8ML PSKT Inject 40 mg into the skin every 30 (thirty) days.     [provider]  albuterol (PROVENTIL HFA;VENTOLIN HFA) 108 (90 BASE) MCG/ACT inhaler Inhale 2 puffs into the lungs every 6 (six) hours as needed for wheezing or shortness of breath.    [provider]  amitriptyline (ELAVIL) 10 MG tablet Take 20 mg by mouth at bedtime.    [provider]  ANORO ELLIPTA 62.5-25 MCG/INH AEPB Inhale 1 puff into the lungs daily. 05/29/19   [provider]  aspirin EC 81 MG tablet Take 81 mg by mouth daily.     [provider]  atorvastatin (LIPITOR) 80 MG tablet Take 80 mg by mouth daily.    [provider]  azithromycin (ZITHROMAX Z-PAK) 250 MG tablet Take as directed 01/18/20   Darylene Price A, FNP  Calcium-Phosphorus-Vitamin D (CITRACAL +D3 PO) Take 1 tablet by mouth daily.    [provider]  clonazePAM (KLONOPIN) 0.25 MG disintegrating tablet Take 1 tablet (0.25 mg total) by mouth daily as needed (anxiety). 08/27/19   Rai, Vernelle Emerald, MD  Cobalamin Combinations (VITAMIN B12-FOLIC ACID) 818-563 MCG TABS Take by mouth daily.    [provider]  ENTRESTO 24-26 MG TAKE ONE TABLET BY MOUTH TWICE DAILY 02/29/20   Darylene Price A, FNP  esomeprazole (NEXIUM) 40 MG capsule Take 40 mg by mouth daily. 05/29/19   [provider]  folic acid (FOLVITE) 149 MCG tablet Take 400 mcg by mouth daily.    [provider]  furosemide (LASIX) 40 MG tablet Take 1 tablet (40 mg total) by mouth 2 (two) times daily. 11/26/19   Loletha Grayer, MD  leflunomide (ARAVA) 10 MG tablet Take 10 mg by mouth daily. 03/29/19   [provider]  mesalamine (PENTASA) 500 MG CR capsule Take 1,000 mg by mouth 2 (two) times daily.    [provider]  metoprolol succinate (TOPROL-XL) 50 MG 24 hr tablet Take 50 mg by mouth daily.     [provider]  Multiple Vitamin (MULTIVITAMIN WITH MINERALS) TABS tablet Take 1 tablet by mouth daily.    [provider]  potassium chloride (KLOR-CON) 10 MEQ tablet Take 10 mEq by mouth daily.    [provider]  predniSONE (DELTASONE) 1 MG tablet Take by mouth. Take 2 tablets at bedtime    [provider]  predniSONE (DELTASONE) 5 MG tablet Take 5 mg by mouth daily with breakfast.    [provider]  risedronate (ACTONEL) 150 MG tablet Take 150 mg by mouth every 30 (thirty) days. with water on empty stomach, nothing by mouth or lie down for next 30 minutes.    [provider]  senna  (SENOKOT) 8.6 MG tablet Take 2 tablets by mouth at bedtime.    [provider]  thiamine (VITAMIN B-1) 100 MG tablet Take 100 mg by mouth daily.    [provider]  vitamin E 400 UNIT capsule Take 400 Units by mouth daily.    [provider]    Allergies Penicillins, Augmentin [amoxicillin-pot clavulanate], Indocin [indomethacin], Lodine [etodolac], Methotrexate derivatives, Gold, and Zantac [ranitidine hcl]  Family History  Problem Relation Age of Onset  . Hypertension Other     Social History Social History   Tobacco Use  . Smoking status: Former Smoker    Quit date: 1974    Years since quitting: 48.0  . Smokeless tobacco:  Never Used  Vaping Use  . Vaping Use: Never used  Substance Use Topics  . Alcohol use: Yes    Comment: occassional  . Drug use: No    Review of Systems  Constitutional: No fever. Eyes: No redness. ENT: No sore throat. Cardiovascular: Denies chest pain. Respiratory: Positive for mild shortness of breath. Gastrointestinal: Positive for nausea. Genitourinary: Negative for dysuria.  Musculoskeletal: Negative for back pain. Skin: Negative for rash. Neurological: Negative for headache.   ____________________________________________   PHYSICAL EXAM:  VITAL SIGNS: ED Triage Vitals [03/08/20 0601]  Enc Vitals Group     BP (!) 156/137     Pulse Rate 87     Resp 16     Temp 98.1 F (36.7 C)     Temp Source Oral     SpO2 94 %     Weight 170 lb (77.1 kg)     Height 5' 5"  (1.651 m)     Head Circumference      Peak Flow      Pain Score 0     Pain Loc      Pain Edu?      Excl. in [place]?     Constitutional: Alert and oriented.  Somewhat weak appearing but in no acute distress. Eyes: Conjunctivae are normal.  EOMI. Head: Atraumatic. Nose: No congestion/rhinnorhea. Mouth/Throat: Mucous membranes are dry.   Neck: Normal range of motion.  Cardiovascular: Normal rate, regular rhythm. Grossly normal heart sounds.  Good  peripheral circulation. Respiratory: Normal respiratory effort.  No retractions. Lungs CTAB. Gastrointestinal: Soft with moderate left lower quadrant tenderness.  No distention.  Genitourinary: No flank tenderness. Musculoskeletal: No lower extremity edema.  Extremities warm and well perfused.  Neurologic:  Normal speech and language. No gross focal neurologic deficits are appreciated.  Skin:  Skin is warm and dry. No rash noted. Psychiatric: Mood and affect are normal. Speech and behavior are normal.  ____________________________________________   LABS (all labs ordered are listed, but only abnormal results are displayed)  Labs Reviewed  COMPREHENSIVE METABOLIC PANEL - Abnormal; Notable for the following components:      Result Value   Potassium 2.6 (*)    Calcium 7.3 (*)    Total Protein 5.7 (*)    Albumin 3.4 (*)    All other components within normal limits  BRAIN NATRIURETIC PEPTIDE - Abnormal; Notable for the following components:   B Natriuretic Peptide 501.8 (*)    All other components within normal limits  TROPONIN I (HIGH SENSITIVITY) - Abnormal; Notable for the following components:   Troponin I (High Sensitivity) 259 (*)    All other components within normal limits  TROPONIN I (HIGH SENSITIVITY) - Abnormal; Notable for the following components:   Troponin I (High Sensitivity) 240 (*)    All other components within normal limits  RESP PANEL BY RT-PCR (FLU A&B, COVID) ARPGX2  LIPASE, BLOOD  CBC  URINALYSIS, COMPLETE (UACMP) WITH MICROSCOPIC  TROPONIN I (HIGH SENSITIVITY)   ____________________________________________  EKG  ED ECG REPORT I, Arta Silence, the attending physician, personally viewed and interpreted this ECG.  Date: 03/08/2020 EKG Time: 0907 Rate: 88 Rhythm: Sinus tachycardia with PACs QRS Axis: normal Intervals: Nonspecific IVCD ST/T Wave abnormalities: LVH with repolarization abnormality Narrative Interpretation: Nonspecific  abnormalities with no evidence of acute ischemia  ____________________________________________  RADIOLOGY  Chest x-ray interpreted by me shows mild vascular congestion with no focal infiltrate CT abdomen/pelvis: No acute abnormality  ____________________________________________   PROCEDURES  Procedure(s) performed: No  Procedures  Critical Care performed: No ____________________________________________   INITIAL IMPRESSION / ASSESSMENT AND PLAN / ED COURSE  Pertinent labs & imaging results that were available during my care of the patient were reviewed by me and considered in my medical decision making (see chart for details).  85 year old female with PMH as noted above presents with nausea and decreased p.o. intake over the last 3 days associated with some loose stools but no abdominal pain.  When EMS arrived the patient was noted to have an O2 saturation in the 70s on her usual 2 L and was increased to 6 L, however she is now in the mid to high 90s on 1 L.  I reviewed the past medical records in Epic; the patient was most recently admitted in September with acute on chronic CHF.  She was admitted in April with a small bowel obstruction treated nonoperatively.  On exam currently, the patient is slightly weak but not ill-appearing.  Her vital signs are normal except for mild hypertension.  O2 saturation is in the high 90s on her usual 1 L of O2.  She denies any active abdominal pain, however is moderately tender in the left lower quadrant to palpation.  Exam is otherwise unremarkable.  Differential includes gastritis, gastroparesis, PUD, pancreatitis or other hepatobiliary etiology, recurrent SBO/ileus, diverticulitis, colitis, other intra-abdominal infection.  There also may be a component of COPD exacerbation or possible COVID-19 or other respiratory infection.  We will obtain labs, Covid swab, chest x-ray, CT abdomen, give fluids and antiemetic, and  reassess.  ----------------------------------------- 9:53 AM on 03/08/2020 -----------------------------------------  Initial troponin is elevated.  EKG does not show any significant ischemic changes.  At this time, there is no evidence of acute MI or indication for heparin.  We will await the repeat troponin and the results with additional work-up.  ----------------------------------------- 10:38 AM on 03/08/2020 -----------------------------------------  Repeat troponin has trended down slightly.  The patient continues to have no chest pain.  I signed her out to the hospitalist Dr. Blaine Hamper for admission.  _____________________  Raven [name] was evaluated in Emergency Department on 03/08/2020 for the symptoms described in the history of present illness. She was evaluated in the context of the global COVID-19 pandemic, which necessitated consideration that the patient might be at risk for infection with the SARS-CoV-2 virus that causes COVID-19. Institutional protocols and algorithms that pertain to the evaluation of patients at risk for COVID-19 are in a state of rapid change based on information released by regulatory bodies including the CDC and federal and state organizations. These policies and algorithms were followed during the patient's care in the ED.  ____________________________________________   FINAL CLINICAL IMPRESSION(S) / ED DIAGNOSES  Final diagnoses:  Elevated troponin  Hypokalemia      NEW MEDICATIONS STARTED DURING THIS VISIT:  New Prescriptions   No medications on file     Note:  This document was prepared using Dragon voice recognition software and may include unintentional dictation errors.    Arta Silence, MD 03/08/20 1039

## 2020-03-08 NOTE — ED Notes (Signed)
Pt to xray

## 2020-03-08 NOTE — ED Notes (Signed)
Secure chat message sent to brenda morrison, np regarding missed potassium dose that was scheduled at 1445 today. Awaiting response and further orders to see if pt needs additional dose.

## 2020-03-08 NOTE — ED Notes (Signed)
Assisted pt up to bathroom

## 2020-03-08 NOTE — ED Notes (Signed)
Pt ate 85% of lunch. No further needs. Lights dimmed per request.

## 2020-03-08 NOTE — ED Triage Notes (Signed)
EMS brings pt in from home for c/o N/V and swelling to extremities; hx CHF; recently rx lasix but has not started meds; home O2 at 1-2l/min via [place] (75% sat at home with no c/o SHOB, placed on 6l to bring sat to 92%)

## 2020-03-08 NOTE — ED Notes (Signed)
Patient assisted to bathroom at this time, pt given hat, but urine was contaminated with stool.

## 2020-03-08 NOTE — ED Notes (Signed)
Pt daughter updated by patient, Probation officer at bedside.

## 2020-03-08 NOTE — ED Notes (Addendum)
Stool mixed with urine when pt went to bathroom, unable to collect for sample. Pt becomes SOB upon exertion even with portable oxygen on. Oxygen saturation when back in bed 88-89% on 2L [place], quickly jumps back up to 98-100% on 2L [place].

## 2020-03-08 NOTE — ED Notes (Signed)
Report to [employer], Therapist, sports

## 2020-03-08 NOTE — ED Notes (Signed)
Daughter called on telephone for medical update. This rn not able to speak with daughter fully due to being in another pt's room. Spoke with daughter regarding good time for RN to contact for update, however daughter states she will not be up at that time. Daughter asking for results of cardiology consult. Daughter informed a good time to call for update is early am. Daughter verbalizes understanding.

## 2020-03-08 NOTE — ED Notes (Signed)
Pt resting, call bell at right side. Bed in low and locked position.

## 2020-03-08 NOTE — H&P (Signed)
History and Physical    Raven [name] XIH:[id number] DOB: 11-Apr-1934 DOA: 03/08/2020  Referring MD/NP/PA:   PCP: Raven Hire, MD   Patient coming from:  The patient is coming from home.  At baseline, pt is partially dependent for most of ADL.        Chief Complaint: SOB  HPI: Raven [name] is a 85 y.o. female with medical history significant of hypertension, hyperlipidemia, COPD, GERD, depression with anxiety, PUD, CAD, myocardial infarction, Crohn's disease, sCHF with EF of 40-45%, anemia, rheumatoid arthritis, who presents with SOB.  Patient states that she has been having shortness breath in the past several days, which has been progressively worsening.  Patient also has cough, no fever or chills.  Denies chest pain.  She states that she has worsening bilateral leg edema recently. Patient has intermittent nausea, vomiting and diarreha movement in the past several days.  Patient has had at least 3 times of loose stool diarrhea since yesterday. She has poor appetite and poor oral intake.  No abdominal pain.  No symptoms of UTI.  No unilateral numbness or tingling in extremities.    Patient is 1-2 L oxygen at home, was reportedly to have oxygen desaturation to 75%, and initially needed 6 L oxygen, then her respiratory function has improved.  Currently patient is on 2 L oxygen with 94% saturation.  ED Course: pt was found to have WBC 9.1, BNP 501, troponin level 259, 240, negative Covid PCR, potassium 2.6, renal function okay, temperature normal, blood pressure 165/78, heart rate 84, RR 23.  CT abdomen/pelvis is negative for acute intraabdominal abnormalities.  Chest x-ray showed mild vascular congestion.  Patient is placed to progressive bed for observation   Review of Systems:   General: no fevers, chills, no body weight gain, has poor appetite, has fatigue HEENT: no blurry vision, hearing changes or sore throat Respiratory: has dyspnea, coughing, no wheezing CV: no chest pain,  no palpitations GI: has nausea, vomiting, diarrhea, no constipation, abdominal pain, GU: no dysuria, burning on urination, increased urinary frequency, hematuria  Ext: has leg edema Neuro: no unilateral weakness, numbness, or tingling, no vision change or hearing loss Skin: no rash, no skin tear. MSK: No muscle spasm, no deformity, no limitation of range of movement in spin Heme: No easy bruising.  Travel history: No recent long distant travel.  Allergy:  Allergies  Allergen Reactions  . Penicillins Other (See Comments)    Reaction:  Unknown  Has patient had a PCN reaction causing immediate rash, facial/tongue/throat swelling, SOB or lightheadedness with hypotension: unknown Has patient had a PCN reaction causing severe rash involving mucus membranes or skin necrosis: unknown Has patient had a PCN reaction that required hospitalization No Has patient had a PCN reaction occurring within the last 10 years: No If all of the above answers are "NO", then may proceed with Cephalosporin use.   . Augmentin [Amoxicillin-Pot Clavulanate] Other (See Comments)    Reaction:  Unknown   . Indocin [Indomethacin] Other (See Comments)    Reaction:  Unknown   . Lodine [Etodolac] Other (See Comments)    Reaction:   GI upset   . Methotrexate Derivatives Other (See Comments)    Reaction:  GI upset   . Gold Rash  . Zantac [Ranitidine Hcl] Rash    Past Medical History:  Diagnosis Date  . Anemia   . Arthritis   . Asthma   . CHF (congestive heart failure) ([place])   . Complication of anesthesia   .  COPD (chronic obstructive pulmonary disease) ([place])   . Crohn's disease ([place])   . Depression    after death of husband  . Hyperlipidemia   . Hypertension   . Myocardial infarction ([place])   . Osteopenia   . Peptic ulcer disease   . Pneumonia   . PONV (postoperative nausea and vomiting)     Past Surgical History:  Procedure Laterality Date  . ABDOMINAL SURGERY    . CARDIAC CATHETERIZATION     with  stent  . COLONOSCOPY WITH PROPOFOL N/A 09/27/2014   Procedure: COLONOSCOPY WITH PROPOFOL;  Surgeon: [name], MD;  Location: [facility] ENDOSCOPY;  Service: Gastroenterology;  Laterality: N/A;  . COLONOSCOPY WITH PROPOFOL N/A 06/21/2019   Procedure: COLONOSCOPY WITH PROPOFOL;  Surgeon: Lin Landsman, MD;  Location: [facility] ENDOSCOPY;  Service: Gastroenterology;  Laterality: N/A;  . EYE SURGERY     bilateral cataract surgeries  . SHOULDER SURGERY Right    x 2   . SMALL INTESTINE SURGERY      Social History:  reports that she quit smoking about 48 years ago. She has never used smokeless tobacco. She reports current alcohol use. She reports that she does not use drugs.  Family History:  Family History  Problem Relation Age of Onset  . Hypertension Other      Prior to Admission medications   Medication Sig Start Date End Date Taking? Authorizing Provider  acetaminophen (TYLENOL) 500 MG tablet Take 500 mg by mouth every 6 (six) hours as needed.    [provider]  Adalimumab 40 MG/0.8ML PSKT Inject 40 mg into the skin every 30 (thirty) days.     [provider]  albuterol (PROVENTIL HFA;VENTOLIN HFA) 108 (90 BASE) MCG/ACT inhaler Inhale 2 puffs into the lungs every 6 (six) hours as needed for wheezing or shortness of breath.    [provider]  amitriptyline (ELAVIL) 10 MG tablet Take 20 mg by mouth at bedtime.    [provider]  ANORO ELLIPTA 62.5-25 MCG/INH AEPB Inhale 1 puff into the lungs daily. 05/29/19   [provider]  aspirin EC 81 MG tablet Take 81 mg by mouth daily.    [provider]  atorvastatin (LIPITOR) 80 MG tablet Take 80 mg by mouth daily.    [provider]  azithromycin (ZITHROMAX Z-PAK) 250 MG tablet Take as directed 01/18/20   Darylene Price A, FNP  Calcium-Phosphorus-Vitamin D (CITRACAL +D3 PO) Take 1 tablet by mouth daily.    [provider]  clonazePAM (KLONOPIN) 0.25 MG disintegrating tablet Take 1  tablet (0.25 mg total) by mouth daily as needed (anxiety). 08/27/19   Rai, Vernelle Emerald, MD  Cobalamin Combinations (VITAMIN B12-FOLIC ACID) 800-349 MCG TABS Take by mouth daily.    [provider]  ENTRESTO 24-26 MG TAKE ONE TABLET BY MOUTH TWICE DAILY 02/29/20   Darylene Price A, FNP  esomeprazole (NEXIUM) 40 MG capsule Take 40 mg by mouth daily. 05/29/19   [provider]  folic acid (FOLVITE) 179 MCG tablet Take 400 mcg by mouth daily.    [provider]  furosemide (LASIX) 40 MG tablet Take 1 tablet (40 mg total) by mouth 2 (two) times daily. 11/26/19   Loletha Grayer, MD  leflunomide (ARAVA) 10 MG tablet Take 10 mg by mouth daily. 03/29/19   [provider]  mesalamine (PENTASA) 500 MG CR capsule Take 1,000 mg by mouth 2 (two) times daily.    [provider]  metoprolol succinate (TOPROL-XL)  50 MG 24 hr tablet Take 50 mg by mouth daily.     [provider]  Multiple Vitamin (MULTIVITAMIN WITH MINERALS) TABS tablet Take 1 tablet by mouth daily.    [provider]  potassium chloride (KLOR-CON) 10 MEQ tablet Take 10 mEq by mouth daily.    [provider]  predniSONE (DELTASONE) 1 MG tablet Take by mouth. Take 2 tablets at bedtime    [provider]  predniSONE (DELTASONE) 5 MG tablet Take 5 mg by mouth daily with breakfast.    [provider]  risedronate (ACTONEL) 150 MG tablet Take 150 mg by mouth every 30 (thirty) days. with water on empty stomach, nothing by mouth or lie down for next 30 minutes.    [provider]  senna (SENOKOT) 8.6 MG tablet Take 2 tablets by mouth at bedtime.    [provider]  thiamine (VITAMIN B-1) 100 MG tablet Take 100 mg by mouth daily.    [provider]  vitamin E 400 UNIT capsule Take 400 Units by mouth daily.    [provider]    Physical Exam: Vitals:   03/08/20 0945 03/08/20 1045 03/08/20 1100 03/08/20 1215  BP: (!) 165/78 (!) 145/62  (!) 145/54 140/69  Pulse: 84 85 88 77  Resp: 19 (!) 23 (!) 22 18  Temp:      TempSrc:      SpO2: 97% 100% 100% 98%  Weight:      Height:       General: Not in acute distress HEENT:       Eyes: PERRL, EOMI, no scleral icterus.       ENT: No discharge from the ears and nose, no pharynx injection, no tonsillar enlargement.        Neck: No JVD, no bruit, no mass felt. Heme: No neck lymph node enlargement. Cardiac: S1/S2, RRR, No murmurs, No gallops or rubs. Respiratory: No rales, wheezing, rhonchi or rubs. GI: Soft, nondistended, nontender, no rebound pain, no organomegaly, BS present. GU: No hematuria Ext: has 1+ pitting leg edema bilaterally. 2+DP/PT pulse bilaterally. Musculoskeletal: No joint deformities, No joint redness or warmth, no limitation of ROM in spin. Skin: No rashes.  Neuro: Alert, oriented X3, cranial nerves II-XII grossly intact, moves all extremities. Psych: Patient is not psychotic, no suicidal or hemocidal ideation.  Labs on Admission: I have personally reviewed following labs and imaging studies  CBC: Recent Labs  Lab 03/08/20 0602  WBC 9.1  HGB 12.4  HCT 38.5  MCV 95.1  PLT 354   Basic Metabolic Panel: Recent Labs  Lab 03/08/20 0602 03/08/20 1115  NA 145  --   K 2.6*  --   CL 109  --   CO2 26  --   GLUCOSE 71  --   BUN 19  --   CREATININE 0.84  --   CALCIUM 7.3*  --   MG  --  1.7  PHOS  --  2.8   GFR: Estimated Creatinine Clearance: 52.1 mL/min (by C-G formula based on SCr of 0.84 mg/dL). Liver Function Tests: Recent Labs  Lab 03/08/20 0602  AST 28  ALT 32  ALKPHOS 76  BILITOT 1.1  PROT 5.7*  ALBUMIN 3.4*   Recent Labs  Lab 03/08/20 0602  LIPASE 22   No results for input(s): AMMONIA in the last 168 hours. Coagulation Profile: No results for input(s): INR, PROTIME in the last 168 hours. Cardiac Enzymes: No results for input(s): CKTOTAL, CKMB, CKMBINDEX, TROPONINI  in the last 168 hours. BNP (last 3 results) No results for  input(s): PROBNP in the last 8760 hours. HbA1C: No results for input(s): HGBA1C in the last 72 hours. CBG: No results for input(s): GLUCAP in the last 168 hours. Lipid Profile: No results for input(s): CHOL, HDL, LDLCALC, TRIG, CHOLHDL, LDLDIRECT in the last 72 hours. Thyroid Function Tests: No results for input(s): TSH, T4TOTAL, FREET4, T3FREE, THYROIDAB in the last 72 hours. Anemia Panel: No results for input(s): VITAMINB12, FOLATE, FERRITIN, TIBC, IRON, RETICCTPCT in the last 72 hours. Urine analysis:    Component Value Date/Time   COLORURINE STRAW (A) 03/08/2020 1232   APPEARANCEUR CLEAR (A) 03/08/2020 1232   APPEARANCEUR Clear 06/08/2014 2052   LABSPEC 1.041 (H) 03/08/2020 1232   LABSPEC 1.015 06/08/2014 2052   PHURINE 5.0 03/08/2020 1232   GLUCOSEU NEGATIVE 03/08/2020 1232   GLUCOSEU Negative 06/08/2014 2052   HGBUR NEGATIVE 03/08/2020 1232   BILIRUBINUR NEGATIVE 03/08/2020 1232   BILIRUBINUR Negative 06/08/2014 2052   KETONESUR 20 (A) 03/08/2020 1232   PROTEINUR NEGATIVE 03/08/2020 1232   NITRITE NEGATIVE 03/08/2020 1232   LEUKOCYTESUR NEGATIVE 03/08/2020 1232   LEUKOCYTESUR Negative 06/08/2014 2052   Sepsis Labs: @LABRCNTIP (procalcitonin:4,lacticidven:4) ) Recent Results (from the past 240 hour(s))  Resp Panel by RT-PCR (Flu A&B, Covid) Nasopharyngeal Swab     Status: None   Collection Time: 03/08/20  8:57 AM   Specimen: Nasopharyngeal Swab; Nasopharyngeal(NP) swabs in vial transport medium  Result Value Ref Range Status   SARS Coronavirus 2 by RT PCR NEGATIVE NEGATIVE Final    Comment: (NOTE) SARS-CoV-2 target nucleic acids are NOT DETECTED.  The SARS-CoV-2 RNA is generally detectable in upper respiratory specimens during the acute phase of infection. The lowest concentration of SARS-CoV-2 viral copies this assay can detect is 138 copies/mL. A negative result does not preclude SARS-Cov-2 infection and should not be used as the sole basis for treatment or other  patient management decisions. A negative result may occur with  improper specimen collection/handling, submission of specimen other than nasopharyngeal swab, presence of viral mutation(s) within the areas targeted by this assay, and inadequate number of viral copies(<138 copies/mL). A negative result must be combined with clinical observations, patient history, and epidemiological information. The expected result is Negative.  Fact Sheet for Patients:  EntrepreneurPulse.com.au  Fact Sheet for Healthcare Providers:  IncredibleEmployment.be  This test is no t yet approved or cleared by the [place] FDA and  has been authorized for detection and/or diagnosis of SARS-CoV-2 by FDA under an Emergency Use Authorization (EUA). This EUA will remain  in effect (meaning this test can be used) for the duration of the COVID-19 declaration under Section 564(b)(1) of the Act, 21 U.S.C.section 360bbb-3(b)(1), unless the authorization is terminated  or revoked sooner.       Influenza A by PCR NEGATIVE NEGATIVE Final   Influenza B by PCR NEGATIVE NEGATIVE Final    Comment: (NOTE) The Xpert Xpress SARS-CoV-2/FLU/RSV plus assay is intended as an aid in the diagnosis of influenza from Nasopharyngeal swab specimens and should not be used as a sole basis for treatment. Nasal washings and aspirates are unacceptable for Xpert Xpress SARS-CoV-2/FLU/RSV testing.  Fact Sheet for Patients: EntrepreneurPulse.com.au  Fact Sheet for Healthcare Providers: IncredibleEmployment.be  This test is not yet approved or cleared by the [place] FDA and has been authorized for detection and/or diagnosis of SARS-CoV-2 by FDA under an Emergency Use Authorization (EUA). This EUA will remain in effect (meaning this test can be  used) for the duration of the COVID-19 declaration under Section 564(b)(1) of the Act, 21 U.S.C. section  360bbb-3(b)(1), unless the authorization is terminated or revoked.  Performed at [facility], [address] [place], [place] [address]      Radiological Exams on Admission: DG Chest 2 View  Result Date: 03/08/2020 CLINICAL DATA:  Cough, shortness of breath. EXAM: CHEST - 2 VIEW COMPARISON:  November 22, 2019 FINDINGS: Stable mild central pulmonary vascular congestion is noted. Normal cardiac size. No pneumothorax or pleural effusion is noted. No consolidative process is noted. Bony thorax is unremarkable. IMPRESSION: Stable mild central pulmonary vascular congestion. Aortic Atherosclerosis (ICD10-I70.0). Electronically Signed   By: Marijo Conception M.D.   On: 03/08/2020 08:47   CT ABDOMEN PELVIS W CONTRAST  Result Date: 03/08/2020 CLINICAL DATA:  Nausea vomiting with tenderness left abdomen for 3 days. Assess for diverticulitis. EXAM: CT ABDOMEN AND PELVIS WITH CONTRAST TECHNIQUE: Multidetector CT imaging of the abdomen and pelvis was performed using the standard protocol following bolus administration of intravenous contrast. CONTRAST:  159m OMNIPAQUE IOHEXOL 300 MG/ML  SOLN COMPARISON:  June 17, 2019 FINDINGS: Lower chest: No acute abnormality. Hepatobiliary: No focal liver abnormality is seen. No gallstones, gallbladder wall thickening, or biliary dilatation. Pancreas: Unremarkable. No pancreatic ductal dilatation or surrounding inflammatory changes. Spleen: Normal in size without focal abnormality. Adrenals/Urinary Tract: Adrenal glands are unremarkable. Scarring of bilateral kidneys are noted. Kidneys are without renal calculi, focal lesion, or hydronephrosis. Bladder is unremarkable. Stomach/Bowel: Postsurgical changes of distal small bowel resection with anastomotic suture unchanged. There is no small bowel obstruction. There is diverticulosis of colon without diverticulitis. The stomach is normal. Vascular/Lymphatic: Aortic atherosclerosis. No enlarged abdominal or pelvic  lymph nodes. Reproductive: Status post hysterectomy. No adnexal masses. Other: Fact containing umbilical hernia is noted. Musculoskeletal: Degenerative joint changes of the spine noted. IMPRESSION: 1. No acute abnormality identified in the abdomen and pelvis. 2. Diverticulosis of colon without diverticulitis. 3. Aortic atherosclerosis. Aortic Atherosclerosis (ICD10-I70.0). Electronically Signed   By: WAbelardo DieselM.D.   On: 03/08/2020 09:54     EKG: I have personally reviewed.  Sinus rhythm, QTC 474, LAD, PVC, poor R wave progression, ST depression in lateral leads.   Assessment/Plan Principal Problem:   Elevated troponin Active Problems:   COPD (chronic obstructive pulmonary disease) (H[facility])   HLD (hyperlipidemia)   Depression with anxiety   CAD (coronary artery disease)   Hypokalemia   HTN (hypertension)   Crohn's disease ([facility])   Acute on chronic systolic CHF (congestive heart failure) ([facility])   Chronic respiratory failure with hypoxia (H[facility])   Rheumatoid arthritis ([facility])   Nausea vomiting and diarrhea    Elevated troponin and hx of CAD: trop 259 -->240 -->224. No CP.  Possibly due to demand ischemia secondary to CHF exacerbation.  Dr. CClayborn Bignessof cardiology is consulted.  - place to progressive unit for observation - Trend Trop - Repeat EKG in the am  - prn aspirin, lipitor  - Risk factor stratification: will check FLP and A1C  - 2d echo  Acute on chronic systolic CHF: Patient has 1+ leg edema, elevated BNP 501, chest x-ray showed vascular congestion, indicating possible CHF exacerbation. -Lasix 40 mg bid by IV -Continue Entresto, aspirin, metoprolol -2d echo -Daily weights -strict I/O's -Low salt diet -Fluid restriction -Obtain REDs Vest reading  COPD (chronic obstructive pulmonary disease) (HCC) and  chronic respiratory failure with hypoxia: no wheezing or rhonchi on auscultation -Bronchodilators -As needed Mucinex for cough -Patient is  taking doxycycline from 02/29/20  to 03/14/2020 per PCP   HLD (hyperlipidemia) -Lipitor  Depression with anxiety -Continue home medications  Hypokalemia: K= 2.6  on admission. - Repleted with 10 mEQ x 2 by VI of KCl and 40 mEq x 2 orally - Check Mg level -->1.7 - Give 1 g of magnesium sulfate  HTN (hypertension) -Metoprolol, Entresto -Patient is on IV Lasix -IV hydralazine.  Crohn's disease ([place]) -Patient is on monthly adalimumab -Continue mesalamine  Rheumatoid arthritis: -Continue leflunomide -Increase home prednisone dose from 7 to 15 mg daily -Give 1 dose of Solu-Cortef with stress dose  Nausea vomiting and diarrhea: CT abdomen/pelvis negative for acute intra-abdominal issue.  May be related to Crohn's disease. -Check C. difficile PCR -Supportive care          DVT ppx:  SQ Lovenox Code Status: Partial code per daughter who is POA (KO with CPR and no intubation) Family Communication: Yes, patient's daughter by phone Disposition Plan:  Anticipate discharge back to previous environment Consults called:  Dr. Clayborn Bigness of card Admission status:   progressive unit for obs    Status is: Observation  The patient remains OBS appropriate and will d/c before 2 midnights.  Dispo: The patient is from: Home              Anticipated d/c is to: Home              Anticipated d/c date is: 1 day              Patient currently is not medically stable to d/c.         Date of Service 03/08/2020    Ivor Costa [facility]   If 7PM-7AM, please contact night-coverage [email or web address] 03/08/2020, 3:19 PM

## 2020-03-08 NOTE — Consult Note (Signed)
CARDIOLOGY CONSULT NOTE               Patient ID: Raven [name] MRN: [id number] DOB/AGE: 1934-05-03 85 y.o.  Admit date: 03/08/2020 Referring Physician Ivor Costa MD Primary Physician Harrel Lemon primary Primary Cardiologist [place] [facility] Reason for Consultation shortness of breath  HPI: Patient has a history of cardiomyopathy mildly reduced overall left ventricular function shortness of breath COPD asthma Crohn's disease coronary artery disease hyperlipidemia hypertension previous myocardial infarction patient states to be doing reasonably well until recently when she started having nausea vomiting diarrhea mild shortness of breath generalized weakness.  Patient is been followed by GI for Crohn's and has been reasonably managed she also follows up with heart failure clinic her ejection fraction is around 40 to 45% denies any significant chest pain no leg swelling but presented for further assessment evaluation.  Patient has been treated with Entresto metoprolol Lasix previously.  Review of systems complete and found to be negative unless listed above     Past Medical History:  Diagnosis Date  . Anemia   . Arthritis   . Asthma   . CHF (congestive heart failure) ([place])   . Complication of anesthesia   . COPD (chronic obstructive pulmonary disease) ([place])   . Crohn's disease ([place])   . Depression    after death of husband  . Hyperlipidemia   . Hypertension   . Myocardial infarction ([place])   . Osteopenia   . Peptic ulcer disease   . Pneumonia   . PONV (postoperative nausea and vomiting)     Past Surgical History:  Procedure Laterality Date  . ABDOMINAL SURGERY    . CARDIAC CATHETERIZATION     with stent  . COLONOSCOPY WITH PROPOFOL N/A 09/27/2014   Procedure: COLONOSCOPY WITH PROPOFOL;  Surgeon: [name], MD;  Location: [facility] ENDOSCOPY;  Service: Gastroenterology;  Laterality: N/A;  . COLONOSCOPY WITH PROPOFOL N/A 06/21/2019   Procedure: COLONOSCOPY WITH PROPOFOL;   Surgeon: Lin Landsman, MD;  Location: [facility] ENDOSCOPY;  Service: Gastroenterology;  Laterality: N/A;  . EYE SURGERY     bilateral cataract surgeries  . SHOULDER SURGERY Right    x 2   . SMALL INTESTINE SURGERY      (Not in a hospital admission)  Social History   Socioeconomic History  . Marital status: Widowed    Spouse name: Not on file  . Number of children: Not on file  . Years of education: Not on file  . Highest education level: Not on file  Occupational History  . Not on file  Tobacco Use  . Smoking status: Former Smoker    Quit date: 1974    Years since quitting: 48.0  . Smokeless tobacco: Never Used  Vaping Use  . Vaping Use: Never used  Substance and Sexual Activity  . Alcohol use: Yes    Comment: occassional  . Drug use: No  . Sexual activity: Never    Birth control/protection: Abstinence  Other Topics Concern  . Not on file  Social History Narrative  . Not on file   Social Determinants of Health   Financial Resource Strain: Not on file  Food Insecurity: Not on file  Transportation Needs: Not on file  Physical Activity: Not on file  Stress: Not on file  Social Connections: Not on file  Intimate Partner Violence: Not on file    Family History  Problem Relation Age of Onset  . Hypertension Other       Review of  systems complete and found to be negative unless listed above      PHYSICAL EXAM  General: Well developed, well nourished, in no acute distress HEENT:  Normocephalic and atramatic Neck:  No JVD.  Lungs: Clear bilaterally to auscultation and percussion. Heart: HRRR . Normal S1 and S2 without gallops or murmurs.  Abdomen: Bowel sounds are positive, abdomen soft and non-tender  Msk:  Back normal, normal gait. Normal strength and tone for age. Extremities: No clubbing, cyanosis or edema.   Neuro: Alert and oriented X 3. Psych:  Good affect, responds appropriately  Labs:   Lab Results  Component Value Date   WBC 9.1  03/08/2020   HGB 12.4 03/08/2020   HCT 38.5 03/08/2020   MCV 95.1 03/08/2020   PLT 213 03/08/2020    Recent Labs  Lab 03/08/20 0602  NA 145  K 2.6*  CL 109  CO2 26  BUN 19  CREATININE 0.84  CALCIUM 7.3*  PROT 5.7*  BILITOT 1.1  ALKPHOS 76  ALT 32  AST 28  GLUCOSE 71   Lab Results  Component Value Date   CKTOTAL 71 11/24/2019   TROPONINI 0.03 (HH) 04/22/2016    Lab Results  Component Value Date   CHOL 131 08/26/2019   CHOL 129 06/09/2014   Lab Results  Component Value Date   HDL 53 08/26/2019   HDL 52 06/09/2014   Lab Results  Component Value Date   LDLCALC 62 08/26/2019   LDLCALC 60 06/09/2014   Lab Results  Component Value Date   TRIG 78 08/26/2019   TRIG 87 06/09/2014   Lab Results  Component Value Date   CHOLHDL 2.5 08/26/2019   No results found for: LDLDIRECT    Radiology: DG Chest 2 View  Result Date: 03/08/2020 CLINICAL DATA:  Cough, shortness of breath. EXAM: CHEST - 2 VIEW COMPARISON:  November 22, 2019 FINDINGS: Stable mild central pulmonary vascular congestion is noted. Normal cardiac size. No pneumothorax or pleural effusion is noted. No consolidative process is noted. Bony thorax is unremarkable. IMPRESSION: Stable mild central pulmonary vascular congestion. Aortic Atherosclerosis (ICD10-I70.0). Electronically Signed   By: Marijo Conception M.D.   On: 03/08/2020 08:47   CT ABDOMEN PELVIS W CONTRAST  Result Date: 03/08/2020 CLINICAL DATA:  Nausea vomiting with tenderness left abdomen for 3 days. Assess for diverticulitis. EXAM: CT ABDOMEN AND PELVIS WITH CONTRAST TECHNIQUE: Multidetector CT imaging of the abdomen and pelvis was performed using the standard protocol following bolus administration of intravenous contrast. CONTRAST:  165m OMNIPAQUE IOHEXOL 300 MG/ML  SOLN COMPARISON:  June 17, 2019 FINDINGS: Lower chest: No acute abnormality. Hepatobiliary: No focal liver abnormality is seen. No gallstones, gallbladder wall thickening, or biliary  dilatation. Pancreas: Unremarkable. No pancreatic ductal dilatation or surrounding inflammatory changes. Spleen: Normal in size without focal abnormality. Adrenals/Urinary Tract: Adrenal glands are unremarkable. Scarring of bilateral kidneys are noted. Kidneys are without renal calculi, focal lesion, or hydronephrosis. Bladder is unremarkable. Stomach/Bowel: Postsurgical changes of distal small bowel resection with anastomotic suture unchanged. There is no small bowel obstruction. There is diverticulosis of colon without diverticulitis. The stomach is normal. Vascular/Lymphatic: Aortic atherosclerosis. No enlarged abdominal or pelvic lymph nodes. Reproductive: Status post hysterectomy. No adnexal masses. Other: Fact containing umbilical hernia is noted. Musculoskeletal: Degenerative joint changes of the spine noted. IMPRESSION: 1. No acute abnormality identified in the abdomen and pelvis. 2. Diverticulosis of colon without diverticulitis. 3. Aortic atherosclerosis. Aortic Atherosclerosis (ICD10-I70.0). Electronically Signed   By: WMallie DartingD.  On: 03/08/2020 09:54    EKG: Sinus tach multiple PACs interventricular conduction delay nonspecific finding  ASSESSMENT AND PLAN:  Shortness of breath COPD Hyperlipidemia Coronary artery disease Borderline troponins Hypertension Acute on chronic systolic congestive heart failure Rheumatoid arthritis Nausea vomiting diarrhea History of Crohn's disease Hypokalemia . Plan Agree with admit to telemetry rule out for myocardial infarction Acute on chronic congestive heart failure based on central pulmonary congestion on chest x-ray but only minimal increase in BNP would continue Entresto metoprolol Lasix Follow-up troponins EKGs Consider echocardiogram for evaluation of left ventricular function wall motion Mildly elevated BNP suggest that heart failure is a minimal component of her shortness of breath at this point would continue Entresto metoprolol  low-dose Lasix Recommend aggressive COPD type therapy including inhalers steroids consider pulmonary input Aggressive therapy for nausea vomiting diarrhea type symptoms possible gastroenteritis Do not recommend aggressive diuresis at this stage as I do not think heart failure is a big component as well as worsening hypokalemia Correct electrolytes include hypokalemia hypomagnesemia Conservative cardiac input at this point no invasive procedures anticipated Signed: Yolonda Kida MD 03/08/2020, 7:34 PM

## 2020-03-08 NOTE — ED Triage Notes (Signed)
Pt to ED via EMS from home c/o n/v for x3 days.  EMS states patient always on 1-2L [place] but was found to be 75%, EMS placed on 6L [place] en route and up to 92%.  Pt brought to triage in wheelchair without oxygen, when placed on monitor oxygen sats were 94% RA and denies SOB.  Pt A&Ox4, skin WNL, no obvious swelling, in NAD at this time.

## 2020-03-08 NOTE — ED Notes (Signed)
Pt ambulated to restroom with assist of RN.

## 2020-03-08 NOTE — ED Notes (Signed)
Tray was taken to pt. Pt stated "I do not want anything from that. I ate the peaches. My bowels have been real watery." This tech offered pt chicken broth and saltines. I took 335m of chicken broth in the rm for pt. Pt has no further needs at this time. Call light within reach.

## 2020-03-08 NOTE — ED Notes (Signed)
Pt assisted to toilet in rm. Pt able to urinate at this time. Pt placed back on cardiac monitor. Call light within reach. Pt has no further needs at this time.

## 2020-03-09 ENCOUNTER — Observation Stay
Admit: 2020-03-09 | Discharge: 2020-03-09 | Disposition: A | Discharge status: Home / Self Care | Payer: Medicare Other | Attending: Internal Medicine | Admitting: Internal Medicine

## 2020-03-09 DIAGNOSIS — K5090 Crohn's disease, unspecified, without complications: Secondary | ICD-10-CM | POA: Diagnosis present

## 2020-03-09 DIAGNOSIS — E785 Hyperlipidemia, unspecified: Secondary | ICD-10-CM | POA: Diagnosis present

## 2020-03-09 DIAGNOSIS — E876 Hypokalemia: Secondary | ICD-10-CM | POA: Diagnosis present

## 2020-03-09 DIAGNOSIS — I252 Old myocardial infarction: Secondary | ICD-10-CM | POA: Diagnosis not present

## 2020-03-09 DIAGNOSIS — Z8249 Family history of ischemic heart disease and other diseases of the circulatory system: Secondary | ICD-10-CM | POA: Diagnosis not present

## 2020-03-09 DIAGNOSIS — K50919 Crohn's disease, unspecified, with unspecified complications: Secondary | ICD-10-CM | POA: Diagnosis not present

## 2020-03-09 DIAGNOSIS — R0600 Dyspnea, unspecified: Secondary | ICD-10-CM | POA: Diagnosis present

## 2020-03-09 DIAGNOSIS — Z87891 Personal history of nicotine dependence: Secondary | ICD-10-CM | POA: Diagnosis not present

## 2020-03-09 DIAGNOSIS — Z20822 Contact with and (suspected) exposure to covid-19: Secondary | ICD-10-CM | POA: Diagnosis present

## 2020-03-09 DIAGNOSIS — Z6826 Body mass index (BMI) 26.0-26.9, adult: Secondary | ICD-10-CM | POA: Diagnosis not present

## 2020-03-09 DIAGNOSIS — M069 Rheumatoid arthritis, unspecified: Secondary | ICD-10-CM | POA: Diagnosis present

## 2020-03-09 DIAGNOSIS — R778 Other specified abnormalities of plasma proteins: Secondary | ICD-10-CM | POA: Diagnosis not present

## 2020-03-09 DIAGNOSIS — R630 Anorexia: Secondary | ICD-10-CM | POA: Diagnosis present

## 2020-03-09 DIAGNOSIS — Z880 Allergy status to penicillin: Secondary | ICD-10-CM | POA: Diagnosis not present

## 2020-03-09 DIAGNOSIS — F418 Other specified anxiety disorders: Secondary | ICD-10-CM | POA: Diagnosis present

## 2020-03-09 DIAGNOSIS — J449 Chronic obstructive pulmonary disease, unspecified: Secondary | ICD-10-CM | POA: Diagnosis present

## 2020-03-09 DIAGNOSIS — I2510 Atherosclerotic heart disease of native coronary artery without angina pectoris: Secondary | ICD-10-CM | POA: Diagnosis present

## 2020-03-09 DIAGNOSIS — K529 Noninfective gastroenteritis and colitis, unspecified: Secondary | ICD-10-CM | POA: Diagnosis present

## 2020-03-09 DIAGNOSIS — Z888 Allergy status to other drugs, medicaments and biological substances status: Secondary | ICD-10-CM | POA: Diagnosis not present

## 2020-03-09 DIAGNOSIS — I248 Other forms of acute ischemic heart disease: Secondary | ICD-10-CM | POA: Diagnosis present

## 2020-03-09 DIAGNOSIS — I5023 Acute on chronic systolic (congestive) heart failure: Secondary | ICD-10-CM | POA: Diagnosis present

## 2020-03-09 DIAGNOSIS — J9611 Chronic respiratory failure with hypoxia: Secondary | ICD-10-CM | POA: Diagnosis present

## 2020-03-09 DIAGNOSIS — T502X5A Adverse effect of carbonic-anhydrase inhibitors, benzothiadiazides and other diuretics, initial encounter: Secondary | ICD-10-CM | POA: Diagnosis present

## 2020-03-09 DIAGNOSIS — Y929 Unspecified place or not applicable: Secondary | ICD-10-CM | POA: Diagnosis not present

## 2020-03-09 DIAGNOSIS — I110 Hypertensive heart disease with heart failure: Secondary | ICD-10-CM | POA: Diagnosis present

## 2020-03-09 DIAGNOSIS — I429 Cardiomyopathy, unspecified: Secondary | ICD-10-CM | POA: Diagnosis present

## 2020-03-09 DIAGNOSIS — K219 Gastro-esophageal reflux disease without esophagitis: Secondary | ICD-10-CM | POA: Diagnosis present

## 2020-03-09 LAB — BASIC METABOLIC PANEL
Anion gap: 12 (ref 5–15)
BUN: 14 mg/dL (ref 8–23)
CO2: 26 mmol/L (ref 22–32)
Calcium: 6.8 mg/dL — ABNORMAL LOW (ref 8.9–10.3)
Chloride: 101 mmol/L (ref 98–111)
Creatinine, Ser: 0.75 mg/dL (ref 0.44–1.00)
GFR, Estimated: >60 mL/min (ref >60)
Glucose, Bld: 81 mg/dL (ref 70–99)
Potassium: 4 mmol/L (ref 3.5–5.1)
Sodium: 139 mmol/L (ref 135–145)

## 2020-03-09 LAB — ECHOCARDIOGRAM COMPLETE
AR max vel: 1.04 cm2
AV Peak grad: 9.4 mmHg
Ao pk vel: 1.53 m/s
Area-P 1/2: 3.77 cm2
Calc EF: 47.5 %
Height: 65 in
P 1/2 time: 586 msec
S' Lateral: 4.01 cm
Single Plane A2C EF: 48.9 %
Single Plane A4C EF: 44.5 %
Weight: 2720 oz

## 2020-03-09 LAB — POTASSIUM: Potassium: 3 mmol/L — ABNORMAL LOW (ref 3.5–5.1)

## 2020-03-09 LAB — CBC
HCT: 37 % (ref 36.0–46.0)
Hemoglobin: 12.3 g/dL (ref 12.0–15.0)
MCH: 31.3 pg (ref 26.0–34.0)
MCHC: 33.2 g/dL (ref 30.0–36.0)
MCV: 94.1 fL (ref 80.0–100.0)
Platelets: 195 10*3/uL (ref 150–400)
RBC: 3.93 MIL/uL (ref 3.87–5.11)
RDW: 14.9 % (ref 11.5–15.5)
WBC: 6.2 10*3/uL (ref 4.0–10.5)
nRBC: 0 % (ref 0.0–0.2)

## 2020-03-09 LAB — C DIFFICILE QUICK SCREEN W PCR REFLEX
C Diff antigen: NEGATIVE
C Diff interpretation: NOT DETECTED
C Diff toxin: NEGATIVE

## 2020-03-09 LAB — HEMOGLOBIN A1C
Hgb A1c MFr Bld: 6.1 % — ABNORMAL HIGH (ref 4.8–5.6)
Mean Plasma Glucose: 128.37 mg/dL

## 2020-03-09 LAB — TROPONIN I (HIGH SENSITIVITY): Troponin I (High Sensitivity): 129 ng/L (ref <18)

## 2020-03-09 LAB — LIPID PANEL
Cholesterol: 127 mg/dL (ref 0–200)
HDL: 43 mg/dL (ref >40)
LDL Cholesterol: 63 mg/dL (ref 0–99)
Total CHOL/HDL Ratio: 3 RATIO
Triglycerides: 104 mg/dL (ref <150)
VLDL: 21 mg/dL (ref 0–40)

## 2020-03-09 LAB — MAGNESIUM: Magnesium: 1.6 mg/dL — ABNORMAL LOW (ref 1.7–2.4)

## 2020-03-09 MED ORDER — MESALAMINE ER 250 MG PO CPCR
500.0000 mg | ORAL_CAPSULE | Freq: Two times a day (BID) | ORAL | Status: DC
  Administered 2020-03-09 – 2020-03-11 (×4): 500 mg via ORAL
  Filled 2020-03-09 (×6): qty 2

## 2020-03-09 MED ORDER — ADALIMUMAB 40 MG/0.8ML ~~LOC~~ PSKT
40.0000 mg | PREFILLED_SYRINGE | SUBCUTANEOUS | Status: DC
  Administered 2020-03-09: 18:00:00 40 mg via SUBCUTANEOUS

## 2020-03-09 MED ORDER — FUROSEMIDE 10 MG/ML IJ SOLN
40.0000 mg | Freq: Two times a day (BID) | INTRAMUSCULAR | Status: DC
  Administered 2020-03-09 – 2020-03-11 (×4): 40 mg via INTRAVENOUS
  Filled 2020-03-09 (×4): qty 4

## 2020-03-09 MED ORDER — POTASSIUM CHLORIDE 10 MEQ/100ML IV SOLN
10.0000 meq | INTRAVENOUS | Status: DC
  Filled 2020-03-09: qty 100

## 2020-03-09 MED ORDER — POTASSIUM CHLORIDE CRYS ER 20 MEQ PO TBCR
40.0000 meq | EXTENDED_RELEASE_TABLET | ORAL | Status: AC
  Administered 2020-03-09: 40 meq via ORAL
  Filled 2020-03-09: qty 2

## 2020-03-09 MED ORDER — LOPERAMIDE HCL 2 MG PO CAPS: 2.0000 mg | ORAL_CAPSULE | ORAL | Status: DC | PRN

## 2020-03-09 MED ORDER — POTASSIUM CHLORIDE CRYS ER 20 MEQ PO TBCR
40.0000 meq | EXTENDED_RELEASE_TABLET | ORAL | Status: AC
  Administered 2020-03-09: 40 meq via ORAL

## 2020-03-09 NOTE — ED Notes (Signed)
Secretary to call for transport for pt to room 250

## 2020-03-09 NOTE — ED Notes (Signed)
Pt resting, iv without s/s of infiltration.

## 2020-03-09 NOTE — ED Notes (Signed)
Pt helped to bathroom, pt steady, pt gets SOB with exertion without O2. Pt states she is on O2 1 Lnc at home. Pt returned to bed, pad and brief changed

## 2020-03-09 NOTE — ED Notes (Signed)
Pt received on 10 meq run of K+ started by previous Therapist, sports. Due to pt being unable to tolerate IV solution, MD changed K+ order from IV to PO. PO k+ administered at this time

## 2020-03-09 NOTE — Progress Notes (Signed)
[facility] Cardiology    SUBJECTIVE: Patient states she does not feel well today she has been having trouble with her Crohn's diarrhea abdominal discomfort weakness and fatigue she has low potassium as well.  Denies any fever chills or sweats no significant shortness of breath but is interested in using her Humira which she has at home   Vitals:   03/09/20 0445 03/09/20 0515 03/09/20 0615 03/09/20 0830  BP:  (!) 131/55 132/65 (!) 147/68  Pulse: 71 68 64 69  Resp: 17 (!) 21 17 19   Temp:      TempSrc:      SpO2: 97% 96% 100% 97%  Weight:      Height:         Intake/Output Summary (Last 24 hours) at 03/09/2020 1057 Last data filed at 03/09/2020 0554 Gross per 24 hour  Intake 120 ml  Output 1400 ml  Net -1280 ml      PHYSICAL EXAM  General: Well developed, well nourished, in no acute distress HEENT:  Normocephalic and atramatic Neck:  No JVD.  Lungs: Clear bilaterally to auscultation and percussion. Heart: HRRR . Normal S1 and S2 without gallops or murmurs.  Abdomen: Bowel sounds are positive, abdomen soft and non-tender  Msk:  Back normal, normal gait. Normal strength and tone for age. Extremities: No clubbing, cyanosis or edema.   Neuro: Alert and oriented X 3. Psych:  Good affect, responds appropriately   LABS: Basic Metabolic Panel: Recent Labs    03/08/20 0602 03/08/20 1115 03/09/20 0018 03/09/20 0652  NA 145  --   --   --   K 2.6*  --  3.0*  --   CL 109  --   --   --   CO2 26  --   --   --   GLUCOSE 71  --   --   --   BUN 19  --   --   --   CREATININE 0.84  --   --   --   CALCIUM 7.3*  --   --   --   MG  --  1.7  --  1.6*  PHOS  --  2.8  --   --    Liver Function Tests: Recent Labs    03/08/20 0602  AST 28  ALT 32  ALKPHOS 76  BILITOT 1.1  PROT 5.7*  ALBUMIN 3.4*   Recent Labs    03/08/20 0602  LIPASE 22   CBC: Recent Labs    03/08/20 0602 03/09/20 0652  WBC 9.1 6.2  HGB 12.4 12.3  HCT 38.5 37.0  MCV 95.1 94.1  PLT 213 195   Cardiac  Enzymes: No results for input(s): CKTOTAL, CKMB, CKMBINDEX, TROPONINI in the last 72 hours. BNP: Invalid input(s): POCBNP D-Dimer: No results for input(s): DDIMER in the last 72 hours. Hemoglobin A1C: No results for input(s): HGBA1C in the last 72 hours. Fasting Lipid Panel: Recent Labs    03/09/20 0652  CHOL 127  HDL 43  LDLCALC 63  TRIG 104  CHOLHDL 3.0   Thyroid Function Tests: No results for input(s): TSH, T4TOTAL, T3FREE, THYROIDAB in the last 72 hours.  Invalid input(s): FREET3 Anemia Panel: No results for input(s): VITAMINB12, FOLATE, FERRITIN, TIBC, IRON, RETICCTPCT in the last 72 hours.  DG Chest 2 View  Result Date: 03/08/2020 CLINICAL DATA:  Cough, shortness of breath. EXAM: CHEST - 2 VIEW COMPARISON:  November 22, 2019 FINDINGS: Stable mild central pulmonary vascular congestion is noted. Normal cardiac size.  No pneumothorax or pleural effusion is noted. No consolidative process is noted. Bony thorax is unremarkable. IMPRESSION: Stable mild central pulmonary vascular congestion. Aortic Atherosclerosis (ICD10-I70.0). Electronically Signed   By: Marijo Conception M.D.   On: 03/08/2020 08:47   CT ABDOMEN PELVIS W CONTRAST  Result Date: 03/08/2020 CLINICAL DATA:  Nausea vomiting with tenderness left abdomen for 3 days. Assess for diverticulitis. EXAM: CT ABDOMEN AND PELVIS WITH CONTRAST TECHNIQUE: Multidetector CT imaging of the abdomen and pelvis was performed using the standard protocol following bolus administration of intravenous contrast. CONTRAST:  152m OMNIPAQUE IOHEXOL 300 MG/ML  SOLN COMPARISON:  June 17, 2019 FINDINGS: Lower chest: No acute abnormality. Hepatobiliary: No focal liver abnormality is seen. No gallstones, gallbladder wall thickening, or biliary dilatation. Pancreas: Unremarkable. No pancreatic ductal dilatation or surrounding inflammatory changes. Spleen: Normal in size without focal abnormality. Adrenals/Urinary Tract: Adrenal glands are unremarkable.  Scarring of bilateral kidneys are noted. Kidneys are without renal calculi, focal lesion, or hydronephrosis. Bladder is unremarkable. Stomach/Bowel: Postsurgical changes of distal small bowel resection with anastomotic suture unchanged. There is no small bowel obstruction. There is diverticulosis of colon without diverticulitis. The stomach is normal. Vascular/Lymphatic: Aortic atherosclerosis. No enlarged abdominal or pelvic lymph nodes. Reproductive: Status post hysterectomy. No adnexal masses. Other: Fact containing umbilical hernia is noted. Musculoskeletal: Degenerative joint changes of the spine noted. IMPRESSION: 1. No acute abnormality identified in the abdomen and pelvis. 2. Diverticulosis of colon without diverticulitis. 3. Aortic atherosclerosis. Aortic Atherosclerosis (ICD10-I70.0). Electronically Signed   By: WAbelardo DieselM.D.   On: 03/08/2020 09:54     Echo mild reduced left ventricular function of around 45%-50%  TELEMETRY: Normal sinus rhythm nonspecific T2 changes 70  ASSESSMENT AND PLAN:  Principal Problem:   Elevated troponin Active Problems:   COPD (chronic obstructive pulmonary disease) ([facility])   HLD (hyperlipidemia)   Depression with anxiety   CAD (coronary artery disease)   Hypokalemia   HTN (hypertension)   Crohn's disease ([facility])   Acute on chronic systolic CHF (congestive heart failure) ([facility])   Chronic respiratory failure with hypoxia ([facility])   Rheumatoid arthritis ([facility])   Nausea vomiting and diarrhea    Plan Recommend maintain hydration patient's had significant diarrhea from Crohn's consider consult GI Recommend aggressive therapy for Crohn's consider consulting GI She is interested in resuming Humira which she takes biweekly Cardiomyopathy her heart failure appears to be relatively compensated has not had any significant shortness of breath continue current management As is necessary for COPD type symptoms Elevated troponin appears to be demand  ischemia Continue conservative management   DYolonda Kida MD 03/09/2020 10:57 AM

## 2020-03-09 NOTE — ED Notes (Signed)
Secure chat message sent to brenda morrison, np to notify of repeat potassium level.

## 2020-03-09 NOTE — ED Notes (Signed)
Pt assisted up to commode to void.  

## 2020-03-09 NOTE — ED Notes (Signed)
Lab here for venipuncture.

## 2020-03-09 NOTE — ED Notes (Signed)
Secure chat message sent to receiving nurse.

## 2020-03-09 NOTE — ED Notes (Signed)
Report to lorrie, rn.

## 2020-03-09 NOTE — ED Notes (Signed)
Pt cleansed of small amount of liquid stool by ed tech.

## 2020-03-09 NOTE — ED Notes (Signed)
Pt cleasned of small amount of liquid stool, not enough to send for sample. Potassium decreased to 2.61mq/hr due to burning.

## 2020-03-09 NOTE — ED Notes (Signed)
Pts cleaned up, bedding changed, and a brief put on.

## 2020-03-09 NOTE — ED Notes (Signed)
Secure chat message sent to morrison, np to notify of decreasing kcl infusion rate due to burning even with carrier fluid. Per Randol Kern, humera not available here.

## 2020-03-09 NOTE — ED Notes (Signed)
Waiting for nurse for 130  to review the chart

## 2020-03-09 NOTE — ED Notes (Signed)
Lab notified of need for venipuncture assist, spoke with [facility],

## 2020-03-09 NOTE — ED Notes (Signed)
Pt helped to toilet, loose stool. Sample sent. Pt assisted back to bed

## 2020-03-09 NOTE — ED Notes (Signed)
Secure chat message sent to brenda morrison, np regarding pt wanting humera injection today.

## 2020-03-09 NOTE — ED Notes (Signed)
Pt concerned about Humera injection. Pharmacy called and does not carry it. Pt informed that she can bring in home supply to use per pharmacy protocol.

## 2020-03-09 NOTE — Progress Notes (Signed)
PROGRESS NOTE    Raven [name]  ZOX:[id number] DOB: [birth year] DOA: 03/08/2020 PCP: Baxter Hire, MD    Assessment & Plan:   Principal Problem:   Elevated troponin Active Problems:   COPD (chronic obstructive pulmonary disease) ([place])   HLD (hyperlipidemia)   Depression with anxiety   CAD (coronary artery disease)   Hypokalemia   HTN (hypertension)   Crohn's disease ([place])   Acute on chronic systolic CHF (congestive heart failure) (HCC)   Chronic respiratory failure with hypoxia (HCC)   Rheumatoid arthritis (HCC)   Nausea vomiting and diarrhea   Dyspnea    Raven [name] is a 86 y.o. female with medical history significant of hypertension, hyperlipidemia, COPD, GERD, depression with anxiety, PUD, CAD, myocardial infarction, Crohn's disease, sCHF with EF of 40-45%, anemia, rheumatoid arthritis, who presents with SOB.   Elevated troponin due to demand ischemia trop 259 -->240 -->224. No CP.  Due to demand ischemia secondary to CHF exacerbation.  Dr. Clayborn Bigness of cardiology consulted. --change bed status to MedSurg.  Acute on chronic systolic CHF:  Patient has 1+ leg edema, elevated BNP 501, chest x-ray showed vascular congestion, indicating possible CHF exacerbation. -started on Lasix 40 mg bid by IV Plan: --cont IV lasix 40 mg BID --cont metop and Entresto --strict I/O  hx of CAD:  --cont ASA and statin  COPD (chronic obstructive pulmonary disease) ([place]) and chronic respiratory failure with hypoxia on 1-2L at baseline --no wheezing or rhonchi on auscultation --cont Anoro Ellipta -As needed Mucinex for cough -cont doxycycline from 02/29/20 to 03/14/2020 per PCP  HLD (hyperlipidemia) -Lipitor  Depression with anxiety -cont home amitriptyline  Hypokalemia:  hypomag --Monitor and replete PRN  HTN (hypertension) -cont metop and entresto --cont IV lasix  Crohn's disease ([place]) -Patient is on monthly adalimumab -Continue mesalamine --cont Adalimumab  (pt brought it from home)  Rheumatoid arthritis: -home prednisone dose increased from 7 to 15 mg daily -s/p 1 dose of Solu-Cortef with stress dose -Continue leflunomide --cont prednisone 15 mg daily  Diarrhea, POA Possible gastroenteritis CT abdomen/pelvis negative for acute intra-abdominal issue.  Pt had not had diarrhea with Crohn's disease in the past --C diff returned neg Plan: -Supportive care --Imodium PRN   DVT prophylaxis: Lovenox SQ Code Status: Full code  Family Communication:  Status is: inpatient Dispo:   The patient is from: home Anticipated d/c is to: home Anticipated d/c date is: 1-2 days Patient currently is not medically stable to d/c due to: on IV lasix   Subjective and Interval History:  Pt reported breathing improved, no chest pain.  Complained of frequent watery diarrhea.  Abdominal mildly tender.   Objective: Vitals:   03/09/20 1247 03/09/20 1647 03/09/20 2016 03/10/20 0030  BP:  (!) 118/57 (!) 130/59 (!) 131/53  Pulse:  68 70 65  Resp:  19 15 16   Temp: 97.8 F (36.6 C) 98.3 F (36.8 C) 97.7 F (36.5 C) 97.6 F (36.4 C)  TempSrc: Oral  Oral   SpO2:  92% 95% 98%  Weight:      Height:        Intake/Output Summary (Last 24 hours) at 03/10/2020 0507 Last data filed at 03/09/2020 0554 Gross per 24 hour  Intake --  Output 300 ml  Net -300 ml   Filed Weights   03/08/20 0601  Weight: 77.1 kg    Examination:   Constitutional: NAD, AAOx3 HEENT: conjunctivae and lids normal, EOMI CV: No cyanosis.   RESP: clear, normal respiratory effort, on  RA Extremities: No effusions, edema in BLE SKIN: warm, dry and intact Neuro: II - XII grossly intact.   Psych: Normal mood and affect.  Appropriate judgement and reason   Data Reviewed: I have personally reviewed following labs and imaging studies  CBC: Recent Labs  Lab 03/08/20 0602 03/09/20 0652  WBC 9.1 6.2  HGB 12.4 12.3  HCT 38.5 37.0  MCV 95.1 94.1  PLT 213 353   Basic Metabolic  Panel: Recent Labs  Lab 03/08/20 0602 03/08/20 1115 03/09/20 0018 03/09/20 0652 03/09/20 1120  NA 145  --   --   --  139  K 2.6*  --  3.0*  --  4.0  CL 109  --   --   --  101  CO2 26  --   --   --  26  GLUCOSE 71  --   --   --  81  BUN 19  --   --   --  14  CREATININE 0.84  --   --   --  0.75  CALCIUM 7.3*  --   --   --  6.8*  MG  --  1.7  --  1.6*  --   PHOS  --  2.8  --   --   --    GFR: Estimated Creatinine Clearance: 54.7 mL/min (by C-G formula based on SCr of 0.75 mg/dL). Liver Function Tests: Recent Labs  Lab 03/08/20 0602  AST 28  ALT 32  ALKPHOS 76  BILITOT 1.1  PROT 5.7*  ALBUMIN 3.4*   Recent Labs  Lab 03/08/20 0602  LIPASE 22   No results for input(s): AMMONIA in the last 168 hours. Coagulation Profile: No results for input(s): INR, PROTIME in the last 168 hours. Cardiac Enzymes: No results for input(s): CKTOTAL, CKMB, CKMBINDEX, TROPONINI in the last 168 hours. BNP (last 3 results) No results for input(s): PROBNP in the last 8760 hours. HbA1C: Recent Labs    03/09/20 0652  HGBA1C 6.1*   CBG: No results for input(s): GLUCAP in the last 168 hours. Lipid Profile: Recent Labs    03/09/20 0652  CHOL 127  HDL 43  LDLCALC 63  TRIG 104  CHOLHDL 3.0   Thyroid Function Tests: No results for input(s): TSH, T4TOTAL, FREET4, T3FREE, THYROIDAB in the last 72 hours. Anemia Panel: No results for input(s): VITAMINB12, FOLATE, FERRITIN, TIBC, IRON, RETICCTPCT in the last 72 hours. Sepsis Labs: No results for input(s): PROCALCITON, LATICACIDVEN in the last 168 hours.  Recent Results (from the past 240 hour(s))  Resp Panel by RT-PCR (Flu A&B, Covid) Nasopharyngeal Swab     Status: None   Collection Time: 03/08/20  8:57 AM   Specimen: Nasopharyngeal Swab; Nasopharyngeal(NP) swabs in vial transport medium  Result Value Ref Range Status   SARS Coronavirus 2 by RT PCR NEGATIVE NEGATIVE Final    Comment: (NOTE) SARS-CoV-2 target nucleic acids are NOT  DETECTED.  The SARS-CoV-2 RNA is generally detectable in upper respiratory specimens during the acute phase of infection. The lowest concentration of SARS-CoV-2 viral copies this assay can detect is 138 copies/mL. A negative result does not preclude SARS-Cov-2 infection and should not be used as the sole basis for treatment or other patient management decisions. A negative result may occur with  improper specimen collection/handling, submission of specimen other than nasopharyngeal swab, presence of viral mutation(s) within the areas targeted by this assay, and inadequate number of viral copies(<138 copies/mL). A negative result must be combined with clinical  observations, patient history, and epidemiological information. The expected result is Negative.  Fact Sheet for Patients:  EntrepreneurPulse.com.au  Fact Sheet for Healthcare Providers:  IncredibleEmployment.be  This test is no t yet approved or cleared by the [place] FDA and  has been authorized for detection and/or diagnosis of SARS-CoV-2 by FDA under an Emergency Use Authorization (EUA). This EUA will remain  in effect (meaning this test can be used) for the duration of the COVID-19 declaration under Section 564(b)(1) of the Act, 21 U.S.C.section 360bbb-3(b)(1), unless the authorization is terminated  or revoked sooner.       Influenza A by PCR NEGATIVE NEGATIVE Final   Influenza B by PCR NEGATIVE NEGATIVE Final    Comment: (NOTE) The Xpert Xpress SARS-CoV-2/FLU/RSV plus assay is intended as an aid in the diagnosis of influenza from Nasopharyngeal swab specimens and should not be used as a sole basis for treatment. Nasal washings and aspirates are unacceptable for Xpert Xpress SARS-CoV-2/FLU/RSV testing.  Fact Sheet for Patients: EntrepreneurPulse.com.au  Fact Sheet for Healthcare Providers: IncredibleEmployment.be  This test is not yet  approved or cleared by the [place] FDA and has been authorized for detection and/or diagnosis of SARS-CoV-2 by FDA under an Emergency Use Authorization (EUA). This EUA will remain in effect (meaning this test can be used) for the duration of the COVID-19 declaration under Section 564(b)(1) of the Act, 21 U.S.C. section 360bbb-3(b)(1), unless the authorization is terminated or revoked.  Performed at [facility], [place], [place] [address]   C Difficile Quick Screen w PCR reflex     Status: None   Collection Time: 03/09/20 12:14 PM   Specimen: STOOL  Result Value Ref Range Status   C Diff antigen NEGATIVE NEGATIVE Final   C Diff toxin NEGATIVE NEGATIVE Final   C Diff interpretation No C. difficile detected.  Final    Comment: Performed at [facility], [address], [place], [place] [address]      Radiology Studies: DG Chest 2 View  Result Date: 03/08/2020 CLINICAL DATA:  Cough, shortness of breath. EXAM: CHEST - 2 VIEW COMPARISON:  November 22, 2019 FINDINGS: Stable mild central pulmonary vascular congestion is noted. Normal cardiac size. No pneumothorax or pleural effusion is noted. No consolidative process is noted. Bony thorax is unremarkable. IMPRESSION: Stable mild central pulmonary vascular congestion. Aortic Atherosclerosis (ICD10-I70.0). Electronically Signed   By: Marijo Conception M.D.   On: 03/08/2020 08:47   CT ABDOMEN PELVIS W CONTRAST  Result Date: 03/08/2020 CLINICAL DATA:  Nausea vomiting with tenderness left abdomen for 3 days. Assess for diverticulitis. EXAM: CT ABDOMEN AND PELVIS WITH CONTRAST TECHNIQUE: Multidetector CT imaging of the abdomen and pelvis was performed using the standard protocol following bolus administration of intravenous contrast. CONTRAST:  158m OMNIPAQUE IOHEXOL 300 MG/ML  SOLN COMPARISON:  June 17, 2019 FINDINGS: Lower chest: No acute abnormality. Hepatobiliary: No focal liver abnormality is seen. No  gallstones, gallbladder wall thickening, or biliary dilatation. Pancreas: Unremarkable. No pancreatic ductal dilatation or surrounding inflammatory changes. Spleen: Normal in size without focal abnormality. Adrenals/Urinary Tract: Adrenal glands are unremarkable. Scarring of bilateral kidneys are noted. Kidneys are without renal calculi, focal lesion, or hydronephrosis. Bladder is unremarkable. Stomach/Bowel: Postsurgical changes of distal small bowel resection with anastomotic suture unchanged. There is no small bowel obstruction. There is diverticulosis of colon without diverticulitis. The stomach is normal. Vascular/Lymphatic: Aortic atherosclerosis. No enlarged abdominal or pelvic lymph nodes. Reproductive: Status post hysterectomy. No adnexal masses. Other:  Fact containing umbilical hernia is noted. Musculoskeletal: Degenerative joint changes of the spine noted. IMPRESSION: 1. No acute abnormality identified in the abdomen and pelvis. 2. Diverticulosis of colon without diverticulitis. 3. Aortic atherosclerosis. Aortic Atherosclerosis (ICD10-I70.0). Electronically Signed   By: Abelardo Diesel M.D.   On: 03/08/2020 09:54   ECHOCARDIOGRAM COMPLETE  Result Date: 03/09/2020    ECHOCARDIOGRAM REPORT   Patient Name:   Raven [name] Date of Exam: 03/09/2020 Medical Rec #:  [id number]       Height:       65.0 in Accession #:    [id number]      Weight:       170.0 lb Date of Birth:  Jul 10, 1934       BSA:          1.846 m Patient Age:    41 years        BP:           132/65 mmHg Patient Gender: F               HR:           72 bpm. Exam Location:  [facility] Procedure: 2D Echo and Strain Analysis Indications:     CHF I50.31  History:         Patient has prior history of Echocardiogram examinations, most                  recent 08/26/2019.  Sonographer:     Arville Go RDCS Referring Phys:  Unknown Foley NIU Diagnosing Phys: Yolonda Kida MD IMPRESSIONS  1. Left ventricular ejection fraction, by estimation, is 45 to 50%. The  left ventricle has mildly decreased function. The left ventricle demonstrates regional wall motion abnormalities (see scoring diagram/findings for description). The left ventricular  internal cavity size was mildly dilated. Left ventricular diastolic parameters are consistent with Grade I diastolic dysfunction (impaired relaxation).  2. Right ventricular systolic function is normal. The right ventricular size is normal.  3. The mitral valve is normal in structure. No evidence of mitral valve regurgitation.  4. The aortic valve is grossly normal. There is mild thickening of the aortic valve. Aortic valve regurgitation is trivial. Mild aortic valve sclerosis is present, with no evidence of aortic valve stenosis. FINDINGS  Left Ventricle: Lateral wall Hypokinesis. Left ventricular ejection fraction, by estimation, is 45 to 50%. The left ventricle has mildly decreased function. The left ventricle demonstrates regional wall motion abnormalities. The left ventricular internal cavity size was mildly dilated. There is no left ventricular hypertrophy. Left ventricular diastolic parameters are consistent with Grade I diastolic dysfunction (impaired relaxation). Right Ventricle: The right ventricular size is normal. No increase in right ventricular wall thickness. Right ventricular systolic function is normal. Left Atrium: Left atrial size was normal in size. Right Atrium: Right atrial size was normal in size. Pericardium: There is no evidence of pericardial effusion. Mitral Valve: The mitral valve is normal in structure. There is mild thickening of the mitral valve leaflet(s). Normal mobility of the mitral valve leaflets. No evidence of mitral valve regurgitation. Tricuspid Valve: The tricuspid valve is normal in structure. Tricuspid valve regurgitation is trivial. Aortic Valve: The aortic valve is grossly normal. There is mild thickening of the aortic valve. Aortic valve regurgitation is trivial. Aortic regurgitation PHT  measures 586 msec. Mild aortic valve sclerosis is present, with no evidence of aortic valve stenosis. Aortic valve peak gradient measures 9.4 mmHg. Pulmonic Valve: The pulmonic valve was normal in  structure. Pulmonic valve regurgitation is not visualized. Aorta: The ascending aorta was not well visualized. IAS/Shunts: No atrial level shunt detected by color flow Doppler.  LEFT VENTRICLE PLAX 2D LVIDd:         5.46 cm     Diastology LVIDs:         4.01 cm     LV e' medial:    2.61 cm/s LV PW:         0.97 cm     LV E/e' medial:  24.8 LV IVS:        1.04 cm     LV e' lateral:   3.15 cm/s LVOT diam:     1.80 cm     LV E/e' lateral: 20.5 LV SV:         34 LV SV Index:   19 LVOT Area:     2.54 cm  LV Volumes (MOD) LV vol d, MOD A2C: 84.8 ml LV vol d, MOD A4C: 89.5 ml LV vol s, MOD A2C: 43.3 ml LV vol s, MOD A4C: 49.7 ml LV SV MOD A2C:     41.5 ml LV SV MOD A4C:     89.5 ml LV SV MOD BP:      42.3 ml RIGHT VENTRICLE RV Basal diam:  3.04 cm RV S prime:     10.70 cm/s TAPSE (M-mode): 1.7 cm LEFT ATRIUM             Index       RIGHT ATRIUM          Index LA diam:        4.60 cm 2.49 cm/m  RA Area:     8.01 cm LA Vol (A2C):   39.4 ml 21.34 ml/m RA Volume:   15.60 ml 8.45 ml/m LA Vol (A4C):   56.0 ml 30.33 ml/m LA Biplane Vol: 49.0 ml 26.54 ml/m  AORTIC VALVE                PULMONIC VALVE AV Area (Vmax): 1.04 cm    PV Vmax:       0.92 m/s AV Vmax:        153.00 cm/s PV Peak grad:  3.4 mmHg AV Peak Grad:   9.4 mmHg LVOT Vmax:      62.70 cm/s LVOT Vmean:     43.500 cm/s LVOT VTI:       0.135 m AI PHT:         586 msec  AORTA Ao Root diam: 3.10 cm Ao Asc diam:  2.90 cm MITRAL VALVE MV Area (PHT): 3.77 cm    SHUNTS MV Decel Time: 201 msec    Systemic VTI:  0.14 m MV E velocity: 64.70 cm/s  Systemic Diam: 1.80 cm MV A velocity: 90.00 cm/s MV E/A ratio:  0.72 Dwayne D Callwood MD Electronically signed by Yolonda Kida MD Signature Date/Time: 03/09/2020/1:32:08 PM    Final      Scheduled Meds: . Adalimumab  40 mg  Subcutaneous Q30 days  . amitriptyline  20 mg Oral QHS  . aspirin EC  81 mg Oral Daily  . atorvastatin  80 mg Oral Daily  . calcium carbonate  200 mg of elemental calcium Oral Daily   And  . cholecalciferol  400 Units Oral Daily  . doxycycline  100 mg Oral BID  . enoxaparin (LOVENOX) injection  40 mg Subcutaneous Q24H  . vitamin B-12  500 mcg Oral Daily   And  . folic acid  1 mg Oral Daily  . furosemide  40 mg Intravenous BID  . leflunomide  10 mg Oral Daily  . mesalamine  500 mg Oral BID  . metoprolol succinate  50 mg Oral Daily  . multivitamin with minerals  1 tablet Oral Daily  . pantoprazole  40 mg Oral Daily  . predniSONE  15 mg Oral Daily  . sacubitril-valsartan  1 tablet Oral BID  . thiamine  100 mg Oral Daily  . umeclidinium-vilanterol  1 puff Inhalation Daily  . vitamin E  400 Units Oral Daily   Continuous Infusions:   LOS: 1 day     Enzo Bi, MD [facility] If 7PM-7AM, please contact night-coverage 03/10/2020, 5:07 AM

## 2020-03-09 NOTE — ED Notes (Signed)
Pt feeling nauseous and requesting to hold several medications/vitamins. Pt requesting chicken broth, dietary contacted to send to ED. Pt urgently assisted to toilet to have BM, RN unable to collect sample at this time. Pt placed back in bed and connected to monitors and given zofran due to reported nausea

## 2020-03-09 NOTE — Progress Notes (Signed)
*  PRELIMINARY RESULTS* Echocardiogram 2D Echocardiogram has been performed.  Raven [name] 03/09/2020, 10:03 AM

## 2020-03-10 DIAGNOSIS — J9611 Chronic respiratory failure with hypoxia: Secondary | ICD-10-CM | POA: Diagnosis not present

## 2020-03-10 DIAGNOSIS — I5023 Acute on chronic systolic (congestive) heart failure: Secondary | ICD-10-CM | POA: Diagnosis not present

## 2020-03-10 LAB — BASIC METABOLIC PANEL
Anion gap: 9 (ref 5–15)
BUN: 22 mg/dL (ref 8–23)
CO2: 26 mmol/L (ref 22–32)
Calcium: 6.6 mg/dL — ABNORMAL LOW (ref 8.9–10.3)
Chloride: 105 mmol/L (ref 98–111)
Creatinine, Ser: 0.72 mg/dL (ref 0.44–1.00)
GFR, Estimated: >60 mL/min (ref >60)
Glucose, Bld: 100 mg/dL — ABNORMAL HIGH (ref 70–99)
Potassium: 3.7 mmol/L (ref 3.5–5.1)
Sodium: 140 mmol/L (ref 135–145)

## 2020-03-10 LAB — CBC
HCT: 37.4 % (ref 36.0–46.0)
Hemoglobin: 12.2 g/dL (ref 12.0–15.0)
MCH: 30.8 pg (ref 26.0–34.0)
MCHC: 32.6 g/dL (ref 30.0–36.0)
MCV: 94.4 fL (ref 80.0–100.0)
Platelets: 188 10*3/uL (ref 150–400)
RBC: 3.96 MIL/uL (ref 3.87–5.11)
RDW: 14.9 % (ref 11.5–15.5)
WBC: 6.3 10*3/uL (ref 4.0–10.5)
nRBC: 0 % (ref 0.0–0.2)

## 2020-03-10 LAB — MAGNESIUM: Magnesium: 1.7 mg/dL (ref 1.7–2.4)

## 2020-03-10 MED ORDER — CALCIUM CARBONATE-VITAMIN D 500-200 MG-UNIT PO TABS
1.0000 | ORAL_TABLET | Freq: Two times a day (BID) | ORAL | Status: DC
  Administered 2020-03-10 – 2020-03-11 (×2): 1 via ORAL
  Filled 2020-03-10 (×2): qty 1

## 2020-03-10 NOTE — Evaluation (Signed)
Physical Therapy Evaluation Patient Details Name: Raven [name] MRN: [id number] DOB: 21-Sep-1934 Today's Date: 03/10/2020   History of Present Illness  Raven [name] is a 86 y.o. female with medical history significant of hypertension, hyperlipidemia, COPD, GERD, depression with anxiety, PUD, CAD, myocardial infarction, Crohn's disease, sCHF with EF of 40-45%, anemia, rheumatoid arthritis, who presents with SOB.  Was admitted for elevated troponin due to demand ischemia second to CHF exacerbation. is on 1-2L/min O2 at home.    Clinical Impression  Patient is alert and oriented and seated in a chair dressed in her gown from home upon arrival. She is able to provide a detailed history. States she lives alone in a single story home with 3 steps to enter with bilateral handrails that she can reach at the same time. She was fully I with all care and mobility using no AD prior to hospitalization and her family stops by periodically to check on her. She has a walk in shower with a seat, a raised toilet seat, and grab bars in the shower. She also has a RW, BSC, and SPC that she does not use. She does use 1-2L/min O2 intermittently at baseline. She has some concerns with difficulty getting up and did work with HHPT for a while which helped and was planning to start OP PT prior to hospitalization. She feels like she is not making as much progress as she wants to with her strength. Patient appears to be functionally independent enough to discharge home with PT services, but continues to struggle with functional strength and independence and would benefit from continued PT in the acute care setting as well as after she discharges. Patient would benefit from home health PT until she is able to safely start OP PT to continue working on higher level of function for improved independence and safety. Patient would benefit from skilled physical therapy to address impairments and functional limitations (see PT Problem List  below) to work towards stated goals and return to PLOF or maximal functional independence.       Follow Up Recommendations Home health PT    Equipment Recommendations   none (pt already has appropriate equipment)   Recommendations for Other Services       Precautions / Restrictions Precautions Precautions: Fall Restrictions Weight Bearing Restrictions: No      Mobility  Bed Mobility Overal bed mobility: Modified Independent             General bed mobility comments: patient seated in chair in room and states she had no difficulty getting out of bed. PT did not observe.    Transfers Overall transfer level: Independent Equipment used: None             General transfer comment: Patient performs sit <> stand from/to plinth, bed, and chair with BUE push off from surface independently.  Ambulation/Gait Ambulation/Gait assistance: Supervision;Min guard Gait Distance (Feet): 630 Feet Assistive device: None Gait Pattern/deviations: Staggering right;Staggering left     General Gait Details: Patient ambulated around B pod 3 times and to the PT gym and back with CGA to supervision. Did tend to have poor foot clearance and staggered occasionally to the right and left stating "I am tired" when asked if this is normal for her. This did improve with further ambulation and patient was able to recover balance without physical asistance using the step strategy. Did fatigue quickly and required seated rest in the PT gym and two standing rests with back against wall on  the way back to her room. SpO2 went down to 89% during ambulation and then returned to 93% during seated rest and stayed around 92% while ambulating back from PT gym. 94% with seated rest at end of session. Did not utilize supplemental O2 this session per patient request.  Stairs Stairs: Yes Stairs assistance: Supervision Stair Management: Two rails;Alternating pattern;Forwards Number of Stairs: 4 General stair  comments: patient ambulated up and down 4 steps with SpO2 remaining above 90% on room air. Supervision for safety.  Wheelchair Mobility    Modified Rankin (Stroke Patients Only)       Balance Overall balance assessment: Needs assistance Sitting-balance support: Feet supported Sitting balance-Leahy Scale: Good Sitting balance - Comments: steady sitting     Standing balance-Leahy Scale: Good Standing balance comment: patient ambulates with no AD but does occastionally stagger right or left but is able to self-correct with stepping strategy. Patient attributes this to fatigue and it improved the longer she walked.                             Pertinent Vitals/Pain Pain Assessment: No/denies pain    Home Living Family/patient expects to be discharged to:: Private residence Living Arrangements: Alone Available Help at Discharge: Family;Available PRN/intermittently   Home Access: Stairs to enter Entrance Stairs-Rails: Can reach both;Right;Left Entrance Stairs-Number of Steps: 3 Home Layout: One level Home Equipment: Cane - single point;Walker - 2 wheels;Grab bars - tub/shower;Shower seat - built in Additional Comments: Patient reports she does not use any AD at home. Only uses 1-2L of 02 via nasal cannula. Reports 1 fall in July when she backed up and tripped over hose while watering flowers early in the morning.    Prior Function Level of Independence: Independent         Comments: Per patient she is independent with all ADLs and iADLs. Reports she drives. States she has been having diffiuclty getting up and had HHPT that helped her with this and was goin to go to outpatient PT but has not stayed well enough to get there.     Hand Dominance   Dominant Hand: Right    Extremity/Trunk Assessment   Upper Extremity Assessment Upper Extremity Assessment: Overall WFL for tasks assessed    Lower Extremity Assessment Lower Extremity Assessment: Overall WFL for  tasks assessed    Cervical / Trunk Assessment Cervical / Trunk Assessment: Normal  Communication   Communication: No difficulties  Cognition Arousal/Alertness: Awake/alert Behavior During Therapy: WFL for tasks assessed/performed Overall Cognitive Status: Within Functional Limits for tasks assessed                                        General Comments General comments (skin integrity, edema, etc.): SpO2 remained 89% or above throughout session on room air    Exercises Other Exercises Other Exercises: practiced balance and functional mobility training, educated on role of PT in acute care setting, discharge reccomendations.   Assessment/Plan    PT Assessment Patient needs continued PT services  PT Problem List Decreased strength;Cardiopulmonary status limiting activity;Decreased activity tolerance;Decreased balance;Decreased mobility       PT Treatment Interventions DME instruction;Balance training;Gait training;Neuromuscular re-education;Stair training;Functional mobility training;Patient/family education;Therapeutic activities;Therapeutic exercise    PT Goals (Current goals can be found in the Care Plan section)  Acute Rehab PT Goals Patient Stated Goal: to get  stronger, stay better, go home PT Goal Formulation: With patient Time For Goal Achievement: 03/24/20 Potential to Achieve Goals: Good    Frequency Min 2X/week   Barriers to discharge   patient lives alone    Co-evaluation               AM-PAC PT "6 Clicks" Mobility  Outcome Measure Help needed turning from your back to your side while in a flat bed without using bedrails?: None Help needed moving from lying on your back to sitting on the side of a flat bed without using bedrails?: None Help needed moving to and from a bed to a chair (including a wheelchair)?: None Help needed standing up from a chair using your arms (e.g., wheelchair or bedside chair)?: None Help needed to walk in  hospital room?: A Little Help needed climbing 3-5 steps with a railing? : A Little 6 Click Score: 22    End of Session Equipment Utilized During Treatment: Gait belt Activity Tolerance: Patient tolerated treatment well;Patient limited by fatigue Patient left: in bed;with call bell/phone within reach;Other (comment) (confirmed with nurse it is okay for her to get up to bathroom independently) Nurse Communication: Mobility status PT Visit Diagnosis: Unsteadiness on feet (R26.81);Difficulty in walking, not elsewhere classified (R26.2);Muscle weakness (generalized) (M62.81)    Time: 0479-9872 PT Time Calculation (min) (ACUTE ONLY): 20 min   Charges:   PT Evaluation $PT Eval Low Complexity: 1 Low          Castella Lerner R. Graylon Good, PT, DPT 03/10/20, 2:18 PM

## 2020-03-10 NOTE — Progress Notes (Signed)
PROGRESS NOTE    Raven [name]  OFH:[id number] DOB: February 21, 1935 DOA: 03/08/2020 PCP: Baxter Hire, MD    Assessment & Plan:   Principal Problem:   Elevated troponin Active Problems:   COPD (chronic obstructive pulmonary disease) ([place])   HLD (hyperlipidemia)   Depression with anxiety   CAD (coronary artery disease)   Hypokalemia   HTN (hypertension)   Crohn's disease ([place])   Acute on chronic systolic CHF (congestive heart failure) (H[facility])   Chronic respiratory failure with hypoxia ([facility])   Rheumatoid arthritis ([facility])   Nausea vomiting and diarrhea   Dyspnea    Raven [name] is a 86 y.o. female with medical history significant of hypertension, hyperlipidemia, COPD, GERD, depression with anxiety, PUD, CAD, myocardial infarction, Crohn's disease, sCHF with EF of 40-45%, anemia, rheumatoid arthritis, who presents with SOB.   Elevated troponin due to demand ischemia trop 259 -->240 -->224. No CP.   Due to demand ischemia secondary to CHF exacerbation.   --Cardiology following  Acute on chronic systolic CHF:  Presented with 1+ leg edema, elevated BNP 501, chest x-ray showed vascular congestion, indicating likely CHF exacerbation. -started on Lasix 40 mg bid by IV Net IO Since Admission: -1,280 mL [03/10/20 1558] Plan: --cont IV lasix 40 mg BID --cont metop and Entresto --strict I/O  Recent bronchitis -seen by PCP on 12/27 and prescribed doxy and prednisone taper over 15 days (see note of Dr. Edwina Barth in care everywhere).   Currently on prednisone 15 mg (usual home dose appears to be 6 mg daily for Crohn's).   Bronchitis symptoms have improved, some ongoing cough. --Continue doxycycline  hx of CAD:  --cont ASA and statin  COPD (chronic obstructive pulmonary disease) (HCC) and chronic respiratory failure with hypoxia on 1-2L at baseline --no wheezing or rhonchi on auscultation --cont Anoro Ellipta -As needed Mucinex for cough -cont doxycycline from 02/29/20 to  03/14/2020 per PCP  HLD (hyperlipidemia) -Lipitor  Depression with anxiety -cont home amitriptyline  Hypokalemia:  hypomag --Monitor and replete PRN  HTN (hypertension) -cont metop and entresto --cont IV lasix  Crohn's disease ([place]) -Patient is on monthly adalimumab -Continue mesalamine --cont Adalimumab (pt brought it from home)  Rheumatoid arthritis: -home prednisone dose increased from 7 to 15 mg daily -s/p 1 dose of Solu-Cortef with stress dose -Continue leflunomide --cont prednisone 15 mg daily  Diarrhea, POA Possible gastroenteritis CT abdomen/pelvis negative for acute intra-abdominal issue.  Pt had not had diarrhea with Crohn's disease in the past --C diff returned neg Plan: -Supportive care --Imodium PRN   DVT prophylaxis: Lovenox SQ Code Status: Full code  Family Communication:  Status is: inpatient Dispo:   The patient is from: home Anticipated d/c is to: home Anticipated d/c date is: 1 day Patient currently is not medically stable to d/c due to: on IV lasix   Subjective and Interval History:  Patient seen with son at bedside today.  She reports continues to have cough, sometimes can produce phlegm overall dry.  No fevers or chills.  Confirms she uses 1 to 2 L of oxygen at home.  Discusses her appointment with Dr. Edwina Barth for bronchitis.   Objective: Vitals:   03/10/20 0833 03/10/20 1203 03/10/20 1300 03/10/20 1305  BP: (!) 115/56 121/62    Pulse: 72 63    Resp: 16 16    Temp: 98.7 F (37.1 C) 97.9 F (36.6 C)    TempSrc:      SpO2: 95% 91% (!) 89% 93%  Weight:  Height:       No intake or output data in the 24 hours ending 03/10/20 1557 Filed Weights   03/08/20 0601 03/10/20 0500  Weight: 77.1 kg 71.8 kg    Examination:   Constitutional: Awake and alert, no acute distress, pleasant HEENT: Moist mucous membranes, hearing grossly normal, EOMI CV: Regular rate and rhythm, no peripheral edema RESP: clear to auscultation  bilaterally, normal respiratory effort, 1 L/min nasal cannula oxygen Extremities: Moves all, no lower extremity edema or cyanosis Neuro: Normal speech, grossly nonfocal exam Psych: Normal mood and affect.  Appropriate judgement and reason   Data Reviewed: I have personally reviewed following labs and imaging studies  CBC: Recent Labs  Lab 03/08/20 0602 03/09/20 0652 03/10/20 0405  WBC 9.1 6.2 6.3  HGB 12.4 12.3 12.2  HCT 38.5 37.0 37.4  MCV 95.1 94.1 94.4  PLT 213 195 779   Basic Metabolic Panel: Recent Labs  Lab 03/08/20 0602 03/08/20 1115 03/09/20 0018 03/09/20 0652 03/09/20 1120 03/10/20 0405  NA 145  --   --   --  139 140  K 2.6*  --  3.0*  --  4.0 3.7  CL 109  --   --   --  101 105  CO2 26  --   --   --  26 26  GLUCOSE 71  --   --   --  81 100*  BUN 19  --   --   --  14 22  CREATININE 0.84  --   --   --  0.75 0.72  CALCIUM 7.3*  --   --   --  6.8* 6.6*  MG  --  1.7  --  1.6*  --  1.7  PHOS  --  2.8  --   --   --   --    GFR: Estimated Creatinine Clearance: 53 mL/min (by C-G formula based on SCr of 0.72 mg/dL). Liver Function Tests: Recent Labs  Lab 03/08/20 0602  AST 28  ALT 32  ALKPHOS 76  BILITOT 1.1  PROT 5.7*  ALBUMIN 3.4*   Recent Labs  Lab 03/08/20 0602  LIPASE 22   No results for input(s): AMMONIA in the last 168 hours. Coagulation Profile: No results for input(s): INR, PROTIME in the last 168 hours. Cardiac Enzymes: No results for input(s): CKTOTAL, CKMB, CKMBINDEX, TROPONINI in the last 168 hours. BNP (last 3 results) No results for input(s): PROBNP in the last 8760 hours. HbA1C: Recent Labs    03/09/20 0652  HGBA1C 6.1*   CBG: No results for input(s): GLUCAP in the last 168 hours. Lipid Profile: Recent Labs    03/09/20 0652  CHOL 127  HDL 43  LDLCALC 63  TRIG 104  CHOLHDL 3.0   Thyroid Function Tests: No results for input(s): TSH, T4TOTAL, FREET4, T3FREE, THYROIDAB in the last 72 hours. Anemia Panel: No results for  input(s): VITAMINB12, FOLATE, FERRITIN, TIBC, IRON, RETICCTPCT in the last 72 hours. Sepsis Labs: No results for input(s): PROCALCITON, LATICACIDVEN in the last 168 hours.  Recent Results (from the past 240 hour(s))  Resp Panel by RT-PCR (Flu A&B, Covid) Nasopharyngeal Swab     Status: None   Collection Time: 03/08/20  8:57 AM   Specimen: Nasopharyngeal Swab; Nasopharyngeal(NP) swabs in vial transport medium  Result Value Ref Range Status   SARS Coronavirus 2 by RT PCR NEGATIVE NEGATIVE Final    Comment: (NOTE) SARS-CoV-2 target nucleic acids are NOT DETECTED.  The SARS-CoV-2 RNA is generally  detectable in upper respiratory specimens during the acute phase of infection. The lowest concentration of SARS-CoV-2 viral copies this assay can detect is 138 copies/mL. A negative result does not preclude SARS-Cov-2 infection and should not be used as the sole basis for treatment or other patient management decisions. A negative result may occur with  improper specimen collection/handling, submission of specimen other than nasopharyngeal swab, presence of viral mutation(s) within the areas targeted by this assay, and inadequate number of viral copies(<138 copies/mL). A negative result must be combined with clinical observations, patient history, and epidemiological information. The expected result is Negative.  Fact Sheet for Patients:  EntrepreneurPulse.com.au  Fact Sheet for Healthcare Providers:  IncredibleEmployment.be  This test is no t yet approved or cleared by the [place] FDA and  has been authorized for detection and/or diagnosis of SARS-CoV-2 by FDA under an Emergency Use Authorization (EUA). This EUA will remain  in effect (meaning this test can be used) for the duration of the COVID-19 declaration under Section 564(b)(1) of the Act, 21 U.S.C.section 360bbb-3(b)(1), unless the authorization is terminated  or revoked sooner.        Influenza A by PCR NEGATIVE NEGATIVE Final   Influenza B by PCR NEGATIVE NEGATIVE Final    Comment: (NOTE) The Xpert Xpress SARS-CoV-2/FLU/RSV plus assay is intended as an aid in the diagnosis of influenza from Nasopharyngeal swab specimens and should not be used as a sole basis for treatment. Nasal washings and aspirates are unacceptable for Xpert Xpress SARS-CoV-2/FLU/RSV testing.  Fact Sheet for Patients: EntrepreneurPulse.com.au  Fact Sheet for Healthcare Providers: IncredibleEmployment.be  This test is not yet approved or cleared by the [place] FDA and has been authorized for detection and/or diagnosis of SARS-CoV-2 by FDA under an Emergency Use Authorization (EUA). This EUA will remain in effect (meaning this test can be used) for the duration of the COVID-19 declaration under Section 564(b)(1) of the Act, 21 U.S.C. section 360bbb-3(b)(1), unless the authorization is terminated or revoked.  Performed at [facility], [place], [place] [address]   C Difficile Quick Screen w PCR reflex     Status: None   Collection Time: 03/09/20 12:14 PM   Specimen: STOOL  Result Value Ref Range Status   C Diff antigen NEGATIVE NEGATIVE Final   C Diff toxin NEGATIVE NEGATIVE Final   C Diff interpretation No C. difficile detected.  Final    Comment: Performed at [facility], [address] [place], [place] [address]      Radiology Studies: ECHOCARDIOGRAM COMPLETE  Result Date: 03/09/2020    ECHOCARDIOGRAM REPORT   Patient Name:   MYKEL MOHL Date of Exam: 03/09/2020 Medical Rec #:  [id number]       Height:       65.0 in Accession #:    [id number]      Weight:       170.0 lb Date of Birth:  January 09, 1935       BSA:          1.846 m Patient Age:    29 years        BP:           132/65 mmHg Patient Gender: F               HR:           72 bpm. Exam Location:  [facility] Procedure: 2D Echo and Strain Analysis Indications:      CHF I50.31  History:  Patient has prior history of Echocardiogram examinations, most                  recent 08/26/2019.  Sonographer:     Arville Go RDCS Referring Phys:  Unknown Foley NIU Diagnosing Phys: Yolonda Kida MD IMPRESSIONS  1. Left ventricular ejection fraction, by estimation, is 45 to 50%. The left ventricle has mildly decreased function. The left ventricle demonstrates regional wall motion abnormalities (see scoring diagram/findings for description). The left ventricular  internal cavity size was mildly dilated. Left ventricular diastolic parameters are consistent with Grade I diastolic dysfunction (impaired relaxation).  2. Right ventricular systolic function is normal. The right ventricular size is normal.  3. The mitral valve is normal in structure. No evidence of mitral valve regurgitation.  4. The aortic valve is grossly normal. There is mild thickening of the aortic valve. Aortic valve regurgitation is trivial. Mild aortic valve sclerosis is present, with no evidence of aortic valve stenosis. FINDINGS  Left Ventricle: Lateral wall Hypokinesis. Left ventricular ejection fraction, by estimation, is 45 to 50%. The left ventricle has mildly decreased function. The left ventricle demonstrates regional wall motion abnormalities. The left ventricular internal cavity size was mildly dilated. There is no left ventricular hypertrophy. Left ventricular diastolic parameters are consistent with Grade I diastolic dysfunction (impaired relaxation). Right Ventricle: The right ventricular size is normal. No increase in right ventricular wall thickness. Right ventricular systolic function is normal. Left Atrium: Left atrial size was normal in size. Right Atrium: Right atrial size was normal in size. Pericardium: There is no evidence of pericardial effusion. Mitral Valve: The mitral valve is normal in structure. There is mild thickening of the mitral valve leaflet(s). Normal mobility of the mitral  valve leaflets. No evidence of mitral valve regurgitation. Tricuspid Valve: The tricuspid valve is normal in structure. Tricuspid valve regurgitation is trivial. Aortic Valve: The aortic valve is grossly normal. There is mild thickening of the aortic valve. Aortic valve regurgitation is trivial. Aortic regurgitation PHT measures 586 msec. Mild aortic valve sclerosis is present, with no evidence of aortic valve stenosis. Aortic valve peak gradient measures 9.4 mmHg. Pulmonic Valve: The pulmonic valve was normal in structure. Pulmonic valve regurgitation is not visualized. Aorta: The ascending aorta was not well visualized. IAS/Shunts: No atrial level shunt detected by color flow Doppler.  LEFT VENTRICLE PLAX 2D LVIDd:         5.46 cm     Diastology LVIDs:         4.01 cm     LV e' medial:    2.61 cm/s LV PW:         0.97 cm     LV E/e' medial:  24.8 LV IVS:        1.04 cm     LV e' lateral:   3.15 cm/s LVOT diam:     1.80 cm     LV E/e' lateral: 20.5 LV SV:         34 LV SV Index:   19 LVOT Area:     2.54 cm  LV Volumes (MOD) LV vol d, MOD A2C: 84.8 ml LV vol d, MOD A4C: 89.5 ml LV vol s, MOD A2C: 43.3 ml LV vol s, MOD A4C: 49.7 ml LV SV MOD A2C:     41.5 ml LV SV MOD A4C:     89.5 ml LV SV MOD BP:      42.3 ml RIGHT VENTRICLE RV Basal diam:  3.04 cm RV  S prime:     10.70 cm/s TAPSE (M-mode): 1.7 cm LEFT ATRIUM             Index       RIGHT ATRIUM          Index LA diam:        4.60 cm 2.49 cm/m  RA Area:     8.01 cm LA Vol (A2C):   39.4 ml 21.34 ml/m RA Volume:   15.60 ml 8.45 ml/m LA Vol (A4C):   56.0 ml 30.33 ml/m LA Biplane Vol: 49.0 ml 26.54 ml/m  AORTIC VALVE                PULMONIC VALVE AV Area (Vmax): 1.04 cm    PV Vmax:       0.92 m/s AV Vmax:        153.00 cm/s PV Peak grad:  3.4 mmHg AV Peak Grad:   9.4 mmHg LVOT Vmax:      62.70 cm/s LVOT Vmean:     43.500 cm/s LVOT VTI:       0.135 m AI PHT:         586 msec  AORTA Ao Root diam: 3.10 cm Ao Asc diam:  2.90 cm MITRAL VALVE MV Area (PHT): 3.77 cm     SHUNTS MV Decel Time: 201 msec    Systemic VTI:  0.14 m MV E velocity: 64.70 cm/s  Systemic Diam: 1.80 cm MV A velocity: 90.00 cm/s MV E/A ratio:  0.72 Dwayne D Callwood MD Electronically signed by Yolonda Kida MD Signature Date/Time: 03/09/2020/1:32:08 PM    Final      Scheduled Meds: . Adalimumab  40 mg Subcutaneous Q30 days  . amitriptyline  20 mg Oral QHS  . aspirin EC  81 mg Oral Daily  . atorvastatin  80 mg Oral Daily  . calcium carbonate  200 mg of elemental calcium Oral Daily   And  . cholecalciferol  400 Units Oral Daily  . calcium-vitamin D  1 tablet Oral BID  . doxycycline  100 mg Oral BID  . enoxaparin (LOVENOX) injection  40 mg Subcutaneous Q24H  . vitamin B-12  500 mcg Oral Daily   And  . folic acid  1 mg Oral Daily  . furosemide  40 mg Intravenous BID  . leflunomide  10 mg Oral Daily  . mesalamine  500 mg Oral BID  . metoprolol succinate  50 mg Oral Daily  . multivitamin with minerals  1 tablet Oral Daily  . pantoprazole  40 mg Oral Daily  . predniSONE  15 mg Oral Daily  . sacubitril-valsartan  1 tablet Oral BID  . thiamine  100 mg Oral Daily  . umeclidinium-vilanterol  1 puff Inhalation Daily  . vitamin E  400 Units Oral Daily   Continuous Infusions:   LOS: 1 day    Time spent: 25 minutes with greater than 50% spent at bedside and in coordination of care   Ezekiel Slocumb, MD [facility] If 7PM-7AM, please contact night-coverage 03/10/2020, 3:57 PM

## 2020-03-10 NOTE — Progress Notes (Signed)
[facility] Cardiology    SUBJECTIVE: Patient feels much better reduced diarrhea from Crohn's no significant shortness of breath in the last 24 to 48 hours no chest pain feels well enough to probably go home   Vitals:   03/10/20 0833 03/10/20 1203 03/10/20 1300 03/10/20 1305  BP: (!) 115/56 121/62    Pulse: 72 63    Resp: 16 16    Temp: 98.7 F (37.1 C) 97.9 F (36.6 C)    TempSrc:      SpO2: 95% 91% (!) 89% 93%  Weight:      Height:        No intake or output data in the 24 hours ending 03/10/20 1355    PHYSICAL EXAM  General: Well developed, well nourished, in no acute distress HEENT:  Normocephalic and atramatic Neck:  No JVD.  Lungs: Clear bilaterally to auscultation and percussion. Heart: HRRR . Normal S1 and S2 without gallops or murmurs.  Abdomen: Bowel sounds are positive, abdomen soft and non-tender  Msk:  Back normal, normal gait. Normal strength and tone for age. Extremities: No clubbing, cyanosis or edema.   Neuro: Alert and oriented X 3. Psych:  Good affect, responds appropriately   LABS: Basic Metabolic Panel: Recent Labs    03/08/20 1115 03/09/20 0018 03/09/20 0652 03/09/20 1120 03/10/20 0405  NA  --   --   --  139 140  K  --    < >  --  4.0 3.7  CL  --   --   --  101 105  CO2  --   --   --  26 26  GLUCOSE  --   --   --  81 100*  BUN  --   --   --  14 22  CREATININE  --   --   --  0.75 0.72  CALCIUM  --   --   --  6.8* 6.6*  MG 1.7  --  1.6*  --  1.7  PHOS 2.8  --   --   --   --    < > = values in this interval not displayed.   Liver Function Tests: Recent Labs    03/08/20 0602  AST 28  ALT 32  ALKPHOS 76  BILITOT 1.1  PROT 5.7*  ALBUMIN 3.4*   Recent Labs    03/08/20 0602  LIPASE 22   CBC: Recent Labs    03/09/20 0652 03/10/20 0405  WBC 6.2 6.3  HGB 12.3 12.2  HCT 37.0 37.4  MCV 94.1 94.4  PLT 195 188   Cardiac Enzymes: No results for input(s): CKTOTAL, CKMB, CKMBINDEX, TROPONINI in the last 72 hours. BNP: Invalid  input(s): POCBNP D-Dimer: No results for input(s): DDIMER in the last 72 hours. Hemoglobin A1C: Recent Labs    03/09/20 0652  HGBA1C 6.1*   Fasting Lipid Panel: Recent Labs    03/09/20 0652  CHOL 127  HDL 43  LDLCALC 63  TRIG 104  CHOLHDL 3.0   Thyroid Function Tests: No results for input(s): TSH, T4TOTAL, T3FREE, THYROIDAB in the last 72 hours.  Invalid input(s): FREET3 Anemia Panel: No results for input(s): VITAMINB12, FOLATE, FERRITIN, TIBC, IRON, RETICCTPCT in the last 72 hours.  ECHOCARDIOGRAM COMPLETE  Result Date: 03/09/2020    ECHOCARDIOGRAM REPORT   Patient Name:   Raven [name] Date of Exam: 03/09/2020 Medical Rec #:  [id number]       Height:       65.0 in Accession #:  [id number]      Weight:       170.0 lb Date of Birth:  April 02, 1934       BSA:          1.846 m Patient Age:    86 years        BP:           132/65 mmHg Patient Gender: F               HR:           72 bpm. Exam Location:  [facility] Procedure: 2D Echo and Strain Analysis Indications:     CHF I50.31  History:         Patient has prior history of Echocardiogram examinations, most                  recent 08/26/2019.  Sonographer:     Arville Go RDCS Referring Phys:  Unknown Foley NIU Diagnosing Phys: Yolonda Kida MD IMPRESSIONS  1. Left ventricular ejection fraction, by estimation, is 45 to 50%. The left ventricle has mildly decreased function. The left ventricle demonstrates regional wall motion abnormalities (see scoring diagram/findings for description). The left ventricular  internal cavity size was mildly dilated. Left ventricular diastolic parameters are consistent with Grade I diastolic dysfunction (impaired relaxation).  2. Right ventricular systolic function is normal. The right ventricular size is normal.  3. The mitral valve is normal in structure. No evidence of mitral valve regurgitation.  4. The aortic valve is grossly normal. There is mild thickening of the aortic valve. Aortic valve regurgitation is  trivial. Mild aortic valve sclerosis is present, with no evidence of aortic valve stenosis. FINDINGS  Left Ventricle: Lateral wall Hypokinesis. Left ventricular ejection fraction, by estimation, is 45 to 50%. The left ventricle has mildly decreased function. The left ventricle demonstrates regional wall motion abnormalities. The left ventricular internal cavity size was mildly dilated. There is no left ventricular hypertrophy. Left ventricular diastolic parameters are consistent with Grade I diastolic dysfunction (impaired relaxation). Right Ventricle: The right ventricular size is normal. No increase in right ventricular wall thickness. Right ventricular systolic function is normal. Left Atrium: Left atrial size was normal in size. Right Atrium: Right atrial size was normal in size. Pericardium: There is no evidence of pericardial effusion. Mitral Valve: The mitral valve is normal in structure. There is mild thickening of the mitral valve leaflet(s). Normal mobility of the mitral valve leaflets. No evidence of mitral valve regurgitation. Tricuspid Valve: The tricuspid valve is normal in structure. Tricuspid valve regurgitation is trivial. Aortic Valve: The aortic valve is grossly normal. There is mild thickening of the aortic valve. Aortic valve regurgitation is trivial. Aortic regurgitation PHT measures 586 msec. Mild aortic valve sclerosis is present, with no evidence of aortic valve stenosis. Aortic valve peak gradient measures 9.4 mmHg. Pulmonic Valve: The pulmonic valve was normal in structure. Pulmonic valve regurgitation is not visualized. Aorta: The ascending aorta was not well visualized. IAS/Shunts: No atrial level shunt detected by color flow Doppler.  LEFT VENTRICLE PLAX 2D LVIDd:         5.46 cm     Diastology LVIDs:         4.01 cm     LV e' medial:    2.61 cm/s LV PW:         0.97 cm     LV E/e' medial:  24.8 LV IVS:        1.04 cm  LV e' lateral:   3.15 cm/s LVOT diam:     1.80 cm     LV E/e'  lateral: 20.5 LV SV:         34 LV SV Index:   19 LVOT Area:     2.54 cm  LV Volumes (MOD) LV vol d, MOD A2C: 84.8 ml LV vol d, MOD A4C: 89.5 ml LV vol s, MOD A2C: 43.3 ml LV vol s, MOD A4C: 49.7 ml LV SV MOD A2C:     41.5 ml LV SV MOD A4C:     89.5 ml LV SV MOD BP:      42.3 ml RIGHT VENTRICLE RV Basal diam:  3.04 cm RV S prime:     10.70 cm/s TAPSE (M-mode): 1.7 cm LEFT ATRIUM             Index       RIGHT ATRIUM          Index LA diam:        4.60 cm 2.49 cm/m  RA Area:     8.01 cm LA Vol (A2C):   39.4 ml 21.34 ml/m RA Volume:   15.60 ml 8.45 ml/m LA Vol (A4C):   56.0 ml 30.33 ml/m LA Biplane Vol: 49.0 ml 26.54 ml/m  AORTIC VALVE                PULMONIC VALVE AV Area (Vmax): 1.04 cm    PV Vmax:       0.92 m/s AV Vmax:        153.00 cm/s PV Peak grad:  3.4 mmHg AV Peak Grad:   9.4 mmHg LVOT Vmax:      62.70 cm/s LVOT Vmean:     43.500 cm/s LVOT VTI:       0.135 m AI PHT:         586 msec  AORTA Ao Root diam: 3.10 cm Ao Asc diam:  2.90 cm MITRAL VALVE MV Area (PHT): 3.77 cm    SHUNTS MV Decel Time: 201 msec    Systemic VTI:  0.14 m MV E velocity: 64.70 cm/s  Systemic Diam: 1.80 cm MV A velocity: 90.00 cm/s MV E/A ratio:  0.72 Trenell Moxey D Ayaka Andes MD Electronically signed by Yolonda Kida MD Signature Date/Time: 03/09/2020/1:32:08 PM    Final      Echo borderline low left ventricular function EF around 45 to 50%  TELEMETRY: Sinus rhythm occasional PACs nonspecific ST-T wave changes:  ASSESSMENT AND PLAN:  Principal Problem:   Elevated troponin Active Problems:   COPD (chronic obstructive pulmonary disease) (HCC)   HLD (hyperlipidemia)   Depression with anxiety   CAD (coronary artery disease)   Hypokalemia   HTN (hypertension)   Crohn's disease (HCC)   Acute on chronic systolic CHF (congestive heart failure) (HCC)   Chronic respiratory failure with hypoxia (HCC)   Rheumatoid arthritis (HCC)   Nausea vomiting and diarrhea   Dyspnea    Plan Shortness of breath much improved from COPD  not requiring any aggressive additional therapy Patient has significant diarrhea secondary to Crohn's but is somewhat improved after Humira Pentasa Imodium Minimal troponins probably demand ischemia Continue hyperlipidemia therapy with statin with Lipitor Maintain Entresto for mild cardiomyopathy Increase activity start ambulating Continue metoprolol for rate control Continue Nexium therapy for reflux type symptoms Agree with low dose Lasix therapy for cardiomyopathy heart failure Recommend conservative cardiac input no invasive strategies planned Outpatient follow-up with cardiology as an outpatient with Dr. Nehemiah Massed when  discharged  Yolonda Kida, MD 03/10/2020 1:55 PM

## 2020-03-11 DIAGNOSIS — K50919 Crohn's disease, unspecified, with unspecified complications: Secondary | ICD-10-CM

## 2020-03-11 LAB — BASIC METABOLIC PANEL
Anion gap: 10 (ref 5–15)
BUN: 36 mg/dL — ABNORMAL HIGH (ref 8–23)
CO2: 27 mmol/L (ref 22–32)
Calcium: 7.7 mg/dL — ABNORMAL LOW (ref 8.9–10.3)
Chloride: 102 mmol/L (ref 98–111)
Creatinine, Ser: 1.06 mg/dL — ABNORMAL HIGH (ref 0.44–1.00)
GFR, Estimated: 52 mL/min — ABNORMAL LOW (ref >60)
Glucose, Bld: 171 mg/dL — ABNORMAL HIGH (ref 70–99)
Potassium: 2.9 mmol/L — ABNORMAL LOW (ref 3.5–5.1)
Sodium: 139 mmol/L (ref 135–145)

## 2020-03-11 LAB — CBC
HCT: 41.6 % (ref 36.0–46.0)
Hemoglobin: 13.3 g/dL (ref 12.0–15.0)
MCH: 30.5 pg (ref 26.0–34.0)
MCHC: 32 g/dL (ref 30.0–36.0)
MCV: 95.4 fL (ref 80.0–100.0)
Platelets: 230 10*3/uL (ref 150–400)
RBC: 4.36 MIL/uL (ref 3.87–5.11)
RDW: 14.7 % (ref 11.5–15.5)
WBC: 6.9 10*3/uL (ref 4.0–10.5)
nRBC: 0 % (ref 0.0–0.2)

## 2020-03-11 LAB — VITAMIN D 25 HYDROXY (VIT D DEFICIENCY, FRACTURES): Vit D, 25-Hydroxy: 56.14 ng/mL (ref 30–100)

## 2020-03-11 LAB — MAGNESIUM: Magnesium: 1.5 mg/dL — ABNORMAL LOW (ref 1.7–2.4)

## 2020-03-11 MED ORDER — PREDNISONE 5 MG PO TABS: 15.0000 mg | ORAL_TABLET | Freq: Every day | ORAL | 0 refills | Status: DC

## 2020-03-11 MED ORDER — POTASSIUM CHLORIDE CRYS ER 20 MEQ PO TBCR
40.0000 meq | EXTENDED_RELEASE_TABLET | ORAL | Status: DC
  Administered 2020-03-11: 40 meq via ORAL
  Filled 2020-03-11: qty 2

## 2020-03-11 MED ORDER — MAGNESIUM SULFATE 2 GM/50ML IV SOLN
2.0000 g | Freq: Once | INTRAVENOUS | Status: AC
  Administered 2020-03-11: 2 g via INTRAVENOUS
  Filled 2020-03-11: qty 50

## 2020-03-11 MED ORDER — DM-GUAIFENESIN ER 30-600 MG PO TB12: 1.0000 | ORAL_TABLET | Freq: Two times a day (BID) | ORAL | 0 refills | Status: AC | PRN

## 2020-03-11 MED ORDER — FUROSEMIDE 40 MG PO TABS: 40.0000 mg | ORAL_TABLET | Freq: Two times a day (BID) | ORAL | Status: DC

## 2020-03-11 NOTE — TOC Initial Note (Signed)
Transition of Care [facility]) - Initial/Assessment Note    Patient Details  Name: Raven [name] MRN: [id number] Date of Birth: [birth year]  [facility] CM/SW Contact:    Magnus Ivan, LCSW Phone Number: 03/11/2020, 12:36 PM  Clinical Narrative:               CSW met with patient at bedside for Readmission Screening. Patient reported she lives alone and drives herself to appointments. PCP is Dr. Edwina Barth. Pharmacy is [facility]. Patient has a RW, cane, and tub bench at home. No SNF history. Patient reported she had [facility] in the past, could not remember agency. Explained HHPT recommendation, patient agreeable and denied having an agency preference. Referral made to [facility] who accepted patient for RN and PT services at [place].    Expected Discharge Plan: [place] Barriers to Discharge: Continued Medical Work up   Patient Goals and CMS Choice Patient states their goals for this hospitalization and ongoing recovery are:: home with home health services CMS Medicare.gov Compare Post Acute Care list provided to:: Patient Choice offered to / list presented to : Patient  Expected Discharge Plan and Services Expected Discharge Plan: [place]       Living arrangements for the past 2 months: Single Family Home Expected Discharge Date: 03/11/20                         HH Arranged: RN,PT [place] Agency: [facility] ([place]) Date [place]: 03/11/20   Representative spoke with at [place]: Floydene Flock  Prior Living Arrangements/Services Living arrangements for the past 2 months: [place] Lives with:: Self Patient language and need for interpreter reviewed:: Yes Do you feel safe going back to the place where you live?: Yes      Need for Family Participation in Patient Care: Yes (Comment) Care giver support system in place?: Yes (comment) Current home services:  DME Criminal Activity/Legal Involvement Pertinent to Current Situation/Hospitalization: No - Comment as needed  Activities of Daily Living      Permission Sought/Granted Permission sought to share information with : Chartered certified accountant granted to share information with : Yes, Verbal Permission Granted     Permission granted to share info w AGENCY: HH, DME agencies        Emotional Assessment       Orientation: : Oriented to Self,Oriented to Place,Oriented to  Time,Oriented to Situation Alcohol / Substance Use: Not Applicable Psych Involvement: No (comment)  Admission diagnosis:  Hypokalemia [E87.6] Dyspnea [R06.00] Elevated troponin [R77.8] Patient Active Problem List   Diagnosis Date Noted  . Dyspnea 03/09/2020  . Nausea vomiting and diarrhea 03/08/2020  . Hyperkalemia   . Hypomagnesemia   . Pain in both lower extremities   . Weakness   . Rheumatoid arthritis ([place])   . Acute on chronic systolic CHF (congestive heart failure) ([place]) 11/22/2019  . Acute on chronic systolic (congestive) heart failure ([place]) 11/22/2019  . Chronic respiratory failure with hypoxia ([place]) 11/22/2019  . HLD (hyperlipidemia) 08/25/2019  . HTN (hypertension) 08/25/2019  . Depression with anxiety   . CAD (coronary artery disease)   . Elevated troponin   . Hypokalemia   . Crohn's disease ([place])   . Acute on chronic respiratory failure with hypoxia ([place])   . 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 SBO (small bowel obstruction) ([place]) 06/17/2019  . Crohn's disease  of small intestine with intestinal obstruction ([place])   . COPD exacerbation ([place]) 10/20/2018  . COPD (chronic obstructive pulmonary disease) ([place]) 04/22/2016  . Acute bronchitis 04/17/2016  . Sepsis ([place]) 04/17/2016  . Peroneal tendonitis 07/18/2015  . Pneumonia 04/19/2015  . CAP (community acquired pneumonia) 11/02/2014   PCP:  Baxter Hire, MD Pharmacy:   [place], [place] - [place] [address] [place] [address] Phone: [phone] Fax: [phone]  [place], [place] - [place] [address] Phone: [phone] Fax: [phone]  [id number] [place], [place] - [place] [address] [place] [address] Phone: [phone] Fax: [phone]     Social Determinants of Health ([place]) Interventions    Readmission Risk Interventions Readmission Risk Prevention Plan 03/11/2020 11/25/2019  Transportation Screening Complete Complete  Medication Review Press photographer) Complete Complete  PCP or Specialist appointment within 3-5 days of discharge Complete Complete  HRI or Home Care Consult Complete Complete  SW Recovery Care/Counseling Consult Complete Complete  Palliative Care Screening Not Applicable Not [place] Complete Not Applicable  Some recent data might be hidden

## 2020-03-11 NOTE — Discharge Summary (Signed)
Physician Discharge Summary  Raven [name] AXK:[id number] DOB: 09/25/1934 DOA: 03/08/2020  PCP: Raven Hire, MD  Admit date: 03/08/2020 Discharge date: 03/11/2020  Admitted From: home Disposition:  home  Recommendations for Outpatient Follow-up:  1. Follow up with PCP in 1-2 weeks 2. Please obtain BMP/CBC in one week 3. Follow up with cardiology  Home Health: PT  Equipment/Devices:    Discharge Condition: Stable  CODE STATUS: Full  Diet recommendation: Heart Healthy    Discharge Diagnoses: Principal Problem:   Elevated troponin Active Problems:   COPD (chronic obstructive pulmonary disease) (HCC)   HLD (hyperlipidemia)   Depression with anxiety   CAD (coronary artery disease)   Hypokalemia   HTN (hypertension)   Crohn's disease (HCC)   Acute on chronic systolic CHF (congestive heart failure) (HCC)   Chronic respiratory failure with hypoxia (HCC)   Rheumatoid arthritis (HCC)   Nausea vomiting and diarrhea   Dyspnea    Summary of HPI and Hospital Course:  Raven [name] a 86 y.o.femalewith medical history significant ofhypertension, hyperlipidemia, COPD, GERD, depression with anxiety, PUD, CAD, myocardial infarction, Crohn's disease,sCHF with EF of 40-45%, anemia, rheumatoid arthritis, who presents withSOB.   Elevated troponindue to demand ischemia secondary to CHF exacerbation.  trop 259 -->240 -->224. No chest pain. --Cardiology consulted  Acute on chronic systolic CHF: Presented with 1+ leg edema, elevated BNP 501, chest x-ray showed vascular congestion, indicating likely CHF exacerbation.   Treated with IV Lasix with good effect. Continue metoprolol, Entrestro Transitioned to oral Lasix 40 mg BID  Recent bronchitis -seen by PCP on 12/27 and prescribed doxy and prednisone taper over 15 days (see note of Dr. Edwina Barth in care everywhere).   Currently on prednisone 15 mg (usual home dose appears to be 6 mg daily for Crohn's).   Bronchitis  symptoms have improved, some ongoing cough. --Continued doxycycline  Hx of CAD: On ASA and statin.  No active chest pain.  COPD and chronic respiratory failure with hypoxia on 1-2L at baseline COPD stable with nowheezing on auscultation, on baseline oxygen requirement. Cont Anoro ellipta Continued doxycycline course as prescribed by PCP (02/29/20 to1/08/2020 Rx)  HLD (hyperlipidemia) on Lipitor  Depression with anxiety on amitriptyline  Hypokalemia / Hypomagnesemia - due to diuresis.  Both were replaced.   Monitor in follow up and replete PRN.  HTN (hypertension) - chronic.  Continue metoprolol and Entresto, diuretic as above.  Crohn's disease ([name]) on monthly adalimumab -Continue mesalamine and adalimumab (pt brought it from home)  Rheumatoid arthritis: Home prednisone dose increased from 84 to 15 mg daily. Given 1 dose of Solu-Cortef stress dose on admission. Continue leflunomide. Continue 15 mg Prednisone for 1 more week, defer taper to usual dose to PCP  Diarrhea, POA Possible gastroenteritis CT abdomen/pelvis negative for acute intra-abdominal issue.  Pt had not had diarrhea with Crohn's disease in the past C diff returned neg Improved with Imodium Monitor closely    Discharge Instructions   Discharge Instructions    (HEART FAILURE PATIENTS) Call MD:  Anytime you have any of the following symptoms: 1) 3 pound weight gain in 24 hours or 5 pounds in 1 week 2) shortness of breath, with or without a dry hacking cough 3) swelling in the hands, feet or stomach 4) if you have to sleep on extra pillows at night in order to breathe.   Complete by: As directed    Call MD for:  extreme fatigue   Complete by: As directed    Call  MD for:  persistant dizziness or light-headedness   Complete by: As directed    Call MD for:  persistant nausea and vomiting   Complete by: As directed    Call MD for:  severe uncontrolled pain   Complete by: As directed    Call MD for:   temperature >100.4   Complete by: As directed    Diet - low sodium heart healthy   Complete by: As directed    Discharge instructions   Complete by: As directed    Please skip your Lasix for today's doses and start taking it again tomorrow.  I have kept you on 15 mg Prednisone for right now.  Please follow up with primary care about tapering back down to your usual dose of Prednisone.  Please have primary care recheck labs later this week on Thursday or Friday if possible.   Increase activity slowly   Complete by: As directed      Allergies as of 03/11/2020      Reactions   Penicillins Other (See Comments)   Reaction:  Unknown  Has patient had a PCN reaction causing immediate rash, facial/tongue/throat swelling, SOB or lightheadedness with hypotension: unknown Has patient had a PCN reaction causing severe rash involving mucus membranes or skin necrosis: unknown Has patient had a PCN reaction that required hospitalization No Has patient had a PCN reaction occurring within the last 10 years: No If all of the above answers are "NO", then may proceed with Cephalosporin use.   Augmentin [amoxicillin-pot Clavulanate] Other (See Comments)   Reaction:  Unknown    Indocin [indomethacin] Other (See Comments)   Reaction:  Unknown    Lodine [etodolac] Other (See Comments)   Reaction:   GI upset    Methotrexate Derivatives Other (See Comments)   Reaction:  GI upset    Gold Rash   Zantac [ranitidine Hcl] Rash      Medication List    TAKE these medications   acetaminophen 500 MG tablet Commonly known as: TYLENOL Take 500-1,000 mg by mouth every 6 (six) hours as needed for mild pain, moderate pain or fever.   Adalimumab 40 MG/0.8ML Pskt Inject 40 mg into the skin every 30 (thirty) days.   albuterol 108 (90 Base) MCG/ACT inhaler Commonly known as: VENTOLIN HFA Inhale 2 puffs into the lungs every 6 (six) hours as needed for wheezing or shortness of breath.   amitriptyline 10 MG  tablet Commonly known as: ELAVIL Take 20 mg by mouth at bedtime.   Anoro Ellipta 62.5-25 MCG/INH Aepb Generic drug: umeclidinium-vilanterol Inhale 1 puff into the lungs daily.   aspirin EC 81 MG tablet Take 81 mg by mouth daily.   atorvastatin 80 MG tablet Commonly known as: LIPITOR Take 80 mg by mouth daily.   CITRACAL +D3 PO Take 1 tablet by mouth daily.   dextromethorphan-guaiFENesin 30-600 MG 12hr tablet Commonly known as: MUCINEX DM Take 1 tablet by mouth 2 (two) times daily as needed for cough.   doxycycline 100 MG capsule Commonly known as: VIBRAMYCIN Take 100 mg by mouth 2 (two) times daily.   Entresto 24-26 MG Generic drug: sacubitril-valsartan TAKE ONE TABLET BY MOUTH TWICE DAILY   esomeprazole 40 MG capsule Commonly known as: NEXIUM Take 40 mg by mouth daily.   folic acid 540 MCG tablet Commonly known as: FOLVITE Take 400 mcg by mouth daily.   furosemide 40 MG tablet Commonly known as: LASIX Take 1 tablet (40 mg total) by mouth 2 (two) times daily.  leflunomide 10 MG tablet Commonly known as: ARAVA Take 10 mg by mouth daily.   mesalamine 500 MG CR capsule Commonly known as: PENTASA Take 1,000 mg by mouth 2 (two) times daily.   metoprolol succinate 50 MG 24 hr tablet Commonly known as: TOPROL-XL Take 50 mg by mouth daily.   multivitamin with minerals Tabs tablet Take 1 tablet by mouth daily.   potassium chloride 10 MEQ tablet Commonly known as: KLOR-CON Take 10 mEq by mouth daily.   predniSONE 5 MG tablet Commonly known as: DELTASONE Take 3 tablets (15 mg total) by mouth daily with breakfast. What changed:   how much to take  when to take this  additional instructions  Another medication with the same name was removed. Continue taking this medication, and follow the directions you see here.   senna 8.6 MG tablet Commonly known as: SENOKOT Take 2 tablets by mouth at bedtime.   thiamine 100 MG tablet Commonly known as: Vitamin  B-1 Take 100 mg by mouth daily.   Vitamin B12-Folic Acid 157-262 MCG Tabs Take 1 tablet by mouth as directed.   vitamin E 180 MG (400 UNITS) capsule Take 400 Units by mouth daily.       Follow-up Information    [facility] Follow up on 03/19/2020.   Specialty: Cardiology Why: at 11:00am. Enter through the [place] entrance Contact information: [address] [place]             Allergies  Allergen Reactions  . Penicillins Other (See Comments)    Reaction:  Unknown  Has patient had a PCN reaction causing immediate rash, facial/tongue/throat swelling, SOB or lightheadedness with hypotension: unknown Has patient had a PCN reaction causing severe rash involving mucus membranes or skin necrosis: unknown Has patient had a PCN reaction that required hospitalization No Has patient had a PCN reaction occurring within the last 10 years: No If all of the above answers are "NO", then may proceed with Cephalosporin use.   . Augmentin [Amoxicillin-Pot Clavulanate] Other (See Comments)    Reaction:  Unknown   . Indocin [Indomethacin] Other (See Comments)    Reaction:  Unknown   . Lodine [Etodolac] Other (See Comments)    Reaction:   GI upset   . Methotrexate Derivatives Other (See Comments)    Reaction:  GI upset   . Gold Rash  . Zantac [Ranitidine Hcl] Rash    Consultations:  Cardiology    Procedures/Studies: DG Chest 2 View  Result Date: 03/08/2020 CLINICAL DATA:  Cough, shortness of breath. EXAM: CHEST - 2 VIEW COMPARISON:  November 22, 2019 FINDINGS: Stable mild central pulmonary vascular congestion is noted. Normal cardiac size. No pneumothorax or pleural effusion is noted. No consolidative process is noted. Bony thorax is unremarkable. IMPRESSION: Stable mild central pulmonary vascular congestion. Aortic Atherosclerosis (ICD10-I70.0). Electronically Signed   By: Marijo Conception M.D.   On: 03/08/2020 08:47   CT ABDOMEN PELVIS W CONTRAST  Result Date: 03/08/2020 CLINICAL DATA:  Nausea vomiting with tenderness left abdomen for 3 days. Assess for diverticulitis. EXAM: CT ABDOMEN AND PELVIS WITH CONTRAST TECHNIQUE: Multidetector CT imaging of the abdomen and pelvis was performed using the standard protocol following bolus administration of intravenous contrast. CONTRAST:  155m OMNIPAQUE IOHEXOL 300 MG/ML  SOLN COMPARISON:  June 17, 2019 FINDINGS: Lower chest: No acute abnormality. Hepatobiliary: No focal liver abnormality is seen. No gallstones, gallbladder wall thickening, or biliary  dilatation. Pancreas: Unremarkable. No pancreatic ductal dilatation or surrounding inflammatory changes. Spleen: Normal in size without focal abnormality. Adrenals/Urinary Tract: Adrenal glands are unremarkable. Scarring of bilateral kidneys are noted. Kidneys are without renal calculi, focal lesion, or hydronephrosis. Bladder is unremarkable. Stomach/Bowel: Postsurgical changes of distal small bowel resection with anastomotic suture unchanged. There is no small bowel obstruction. There is diverticulosis of colon without diverticulitis. The stomach is normal. Vascular/Lymphatic: Aortic atherosclerosis. No enlarged abdominal or pelvic lymph nodes. Reproductive: Status post hysterectomy. No adnexal masses. Other: Fact containing umbilical hernia is noted. Musculoskeletal: Degenerative joint changes of the spine noted. IMPRESSION: 1. No acute abnormality identified in the abdomen and pelvis. 2. Diverticulosis of colon without diverticulitis. 3. Aortic atherosclerosis. Aortic Atherosclerosis (ICD10-I70.0). Electronically Signed   By: Abelardo Diesel M.D.   On: 03/08/2020 09:54   ECHOCARDIOGRAM COMPLETE  Result Date: 03/09/2020    ECHOCARDIOGRAM REPORT   Patient Name:   Raven [name] Date of Exam: 03/09/2020 Medical Rec #:  [id number]       Height:       65.0 in Accession #:    [id number]      Weight:        170.0 lb Date of Birth:  11-07-1934       BSA:          1.846 m Patient Age:    27 years        BP:           132/65 mmHg Patient Gender: F               HR:           72 bpm. Exam Location:  [facility] Procedure: 2D Echo and Strain Analysis Indications:     CHF I50.31  History:         Patient has prior history of Echocardiogram examinations, most                  recent 08/26/2019.  Sonographer:     Arville Go RDCS Referring Phys:  Unknown Foley NIU Diagnosing Phys: Yolonda Kida MD IMPRESSIONS  1. Left ventricular ejection fraction, by estimation, is 45 to 50%. The left ventricle has mildly decreased function. The left ventricle demonstrates regional wall motion abnormalities (see scoring diagram/findings for description). The left ventricular  internal cavity size was mildly dilated. Left ventricular diastolic parameters are consistent with Grade I diastolic dysfunction (impaired relaxation).  2. Right ventricular systolic function is normal. The right ventricular size is normal.  3. The mitral valve is normal in structure. No evidence of mitral valve regurgitation.  4. The aortic valve is grossly normal. There is mild thickening of the aortic valve. Aortic valve regurgitation is trivial. Mild aortic valve sclerosis is present, with no evidence of aortic valve stenosis. FINDINGS  Left Ventricle: Lateral wall Hypokinesis. Left ventricular ejection fraction, by estimation, is 45 to 50%. The left ventricle has mildly decreased function. The left ventricle demonstrates regional wall motion abnormalities. The left ventricular internal cavity size was mildly dilated. There is no left ventricular hypertrophy. Left ventricular diastolic parameters are consistent with Grade I diastolic dysfunction (impaired relaxation). Right Ventricle: The right ventricular size is normal. No increase in right ventricular wall thickness. Right ventricular systolic function is normal. Left Atrium: Left atrial size was normal in size.  Right Atrium: Right atrial size was normal in size. Pericardium: There is no evidence of pericardial effusion. Mitral Valve: The mitral valve is normal in structure. There is  mild thickening of the mitral valve leaflet(s). Normal mobility of the mitral valve leaflets. No evidence of mitral valve regurgitation. Tricuspid Valve: The tricuspid valve is normal in structure. Tricuspid valve regurgitation is trivial. Aortic Valve: The aortic valve is grossly normal. There is mild thickening of the aortic valve. Aortic valve regurgitation is trivial. Aortic regurgitation PHT measures 586 msec. Mild aortic valve sclerosis is present, with no evidence of aortic valve stenosis. Aortic valve peak gradient measures 9.4 mmHg. Pulmonic Valve: The pulmonic valve was normal in structure. Pulmonic valve regurgitation is not visualized. Aorta: The ascending aorta was not well visualized. IAS/Shunts: No atrial level shunt detected by color flow Doppler.  LEFT VENTRICLE PLAX 2D LVIDd:         5.46 cm     Diastology LVIDs:         4.01 cm     LV e' medial:    2.61 cm/s LV PW:         0.97 cm     LV E/e' medial:  24.8 LV IVS:        1.04 cm     LV e' lateral:   3.15 cm/s LVOT diam:     1.80 cm     LV E/e' lateral: 20.5 LV SV:         34 LV SV Index:   19 LVOT Area:     2.54 cm  LV Volumes (MOD) LV vol d, MOD A2C: 84.8 ml LV vol d, MOD A4C: 89.5 ml LV vol s, MOD A2C: 43.3 ml LV vol s, MOD A4C: 49.7 ml LV SV MOD A2C:     41.5 ml LV SV MOD A4C:     89.5 ml LV SV MOD BP:      42.3 ml RIGHT VENTRICLE RV Basal diam:  3.04 cm RV S prime:     10.70 cm/s TAPSE (M-mode): 1.7 cm LEFT ATRIUM             Index       RIGHT ATRIUM          Index LA diam:        4.60 cm 2.49 cm/m  RA Area:     8.01 cm LA Vol (A2C):   39.4 ml 21.34 ml/m RA Volume:   15.60 ml 8.45 ml/m LA Vol (A4C):   56.0 ml 30.33 ml/m LA Biplane Vol: 49.0 ml 26.54 ml/m  AORTIC VALVE                PULMONIC VALVE AV Area (Vmax): 1.04 cm    PV Vmax:       0.92 m/s AV Vmax:         153.00 cm/s PV Peak grad:  3.4 mmHg AV Peak Grad:   9.4 mmHg LVOT Vmax:      62.70 cm/s LVOT Vmean:     43.500 cm/s LVOT VTI:       0.135 m AI PHT:         586 msec  AORTA Ao Root diam: 3.10 cm Ao Asc diam:  2.90 cm MITRAL VALVE MV Area (PHT): 3.77 cm    SHUNTS MV Decel Time: 201 msec    Systemic VTI:  0.14 m MV E velocity: 64.70 cm/s  Systemic Diam: 1.80 cm MV A velocity: 90.00 cm/s MV E/A ratio:  0.72 Dwayne D Callwood MD Electronically signed by Yolonda Kida MD Signature Date/Time: 03/09/2020/1:32:08 PM    Final        Subjective:  Pt feels better.  No acute complaints including chest pain, SOB, abdominal pain, N/V/D.  Looks forward to getting home.   Discharge Exam: Vitals:   03/11/20 0825 03/11/20 1100  BP:  133/71  Pulse:  73  Resp:  18  Temp:  97.8 F (36.6 C)  SpO2: 90% 93%   Vitals:   03/11/20 0502 03/11/20 0752 03/11/20 0825 03/11/20 1100  BP: 124/68 (!) 151/70  133/71  Pulse: 71 70  73  Resp: 18 17  18   Temp: 97.8 F (36.6 C) 97.7 F (36.5 C)  97.8 F (36.6 C)  TempSrc: Oral     SpO2: 95% (!) 75% 90% 93%  Weight:      Height:        General: Pt is alert, awake, not in acute distress Cardiovascular: RRR, S1/S2 +, no rubs, no gallops Respiratory: CTA bilaterally, no wheezing, no rhonchi Abdominal: Soft, NT, ND, bowel sounds + Extremities: no edema, no cyanosis    The results of significant diagnostics from this hospitalization (including imaging, microbiology, ancillary and laboratory) are listed below for reference.     Microbiology: Recent Results (from the past 240 hour(s))  Resp Panel by RT-PCR (Flu A&B, Covid) Nasopharyngeal Swab     Status: None   Collection Time: 03/08/20  8:57 AM   Specimen: Nasopharyngeal Swab; Nasopharyngeal(NP) swabs in vial transport medium  Result Value Ref Range Status   SARS Coronavirus 2 by RT PCR NEGATIVE NEGATIVE Final    Comment: (NOTE) SARS-CoV-2 target nucleic acids are NOT DETECTED.  The SARS-CoV-2 RNA is  generally detectable in upper respiratory specimens during the acute phase of infection. The lowest concentration of SARS-CoV-2 viral copies this assay can detect is 138 copies/mL. A negative result does not preclude SARS-Cov-2 infection and should not be used as the sole basis for treatment or other patient management decisions. A negative result may occur with  improper specimen collection/handling, submission of specimen other than nasopharyngeal swab, presence of viral mutation(s) within the areas targeted by this assay, and inadequate number of viral copies(<138 copies/mL). A negative result must be combined with clinical observations, patient history, and epidemiological information. The expected result is Negative.  Fact Sheet for Patients:  EntrepreneurPulse.com.au  Fact Sheet for Healthcare Providers:  IncredibleEmployment.be  This test is no t yet approved or cleared by the [place] FDA and  has been authorized for detection and/or diagnosis of SARS-CoV-2 by FDA under an Emergency Use Authorization (EUA). This EUA will remain  in effect (meaning this test can be used) for the duration of the COVID-19 declaration under Section 564(b)(1) of the Act, 21 U.S.C.section 360bbb-3(b)(1), unless the authorization is terminated  or revoked sooner.       Influenza A by PCR NEGATIVE NEGATIVE Final   Influenza B by PCR NEGATIVE NEGATIVE Final    Comment: (NOTE) The Xpert Xpress SARS-CoV-2/FLU/RSV plus assay is intended as an aid in the diagnosis of influenza from Nasopharyngeal swab specimens and should not be used as a sole basis for treatment. Nasal washings and aspirates are unacceptable for Xpert Xpress SARS-CoV-2/FLU/RSV testing.  Fact Sheet for Patients: EntrepreneurPulse.com.au  Fact Sheet for Healthcare Providers: IncredibleEmployment.be  This test is not yet approved or cleared by the [place] FDA and has been authorized for detection and/or diagnosis of SARS-CoV-2 by FDA under an Emergency Use Authorization (EUA). This EUA will remain in effect (meaning this test can be used) for the duration of the COVID-19 declaration under Section 564(b)(1) of the Act,  21 U.S.C. section 360bbb-3(b)(1), unless the authorization is terminated or revoked.  Performed at [facility], [place], [place] [address]   C Difficile Quick Screen w PCR reflex     Status: None   Collection Time: 03/09/20 12:14 PM   Specimen: STOOL  Result Value Ref Range Status   C Diff antigen NEGATIVE NEGATIVE Final   C Diff toxin NEGATIVE NEGATIVE Final   C Diff interpretation No C. difficile detected.  Final    Comment: Performed at [facility], [address] [place], [place] [address]     Labs: BNP (last 3 results) Recent Labs    11/22/19 1945 11/25/19 0534 03/08/20 0602  BNP 2,993.8* 967.5* 803.2*   Basic Metabolic Panel: Recent Labs  Lab 03/08/20 0602 03/08/20 1115 03/09/20 0018 03/09/20 0652 03/09/20 1120 03/10/20 0405 03/11/20 0927  NA 145  --   --   --  139 140 139  K 2.6*  --  3.0*  --  4.0 3.7 2.9*  CL 109  --   --   --  101 105 102  CO2 26  --   --   --  26 26 27   GLUCOSE 71  --   --   --  81 100* 171*  BUN 19  --   --   --  14 22 36*  CREATININE 0.84  --   --   --  0.75 0.72 1.06*  CALCIUM 7.3*  --   --   --  6.8* 6.6* 7.7*  MG  --  1.7  --  1.6*  --  1.7 1.5*  PHOS  --  2.8  --   --   --   --   --    Liver Function Tests: Recent Labs  Lab 03/08/20 0602  AST 28  ALT 32  ALKPHOS 76  BILITOT 1.1  PROT 5.7*  ALBUMIN 3.4*   Recent Labs  Lab 03/08/20 0602  LIPASE 22   No results for input(s): AMMONIA in the last 168 hours. CBC: Recent Labs  Lab 03/08/20 0602 03/09/20 0652 03/10/20 0405 03/11/20 0927  WBC 9.1 6.2 6.3 6.9  HGB 12.4 12.3 12.2 13.3  HCT 38.5 37.0 37.4 41.6  MCV 95.1 94.1 94.4 95.4  PLT 213 195 188 230    Cardiac Enzymes: No results for input(s): CKTOTAL, CKMB, CKMBINDEX, TROPONINI in the last 168 hours. BNP: Invalid input(s): POCBNP CBG: No results for input(s): GLUCAP in the last 168 hours. D-Dimer No results for input(s): DDIMER in the last 72 hours. Hgb A1c Recent Labs    03/09/20 0652  HGBA1C 6.1*   Lipid Profile Recent Labs    03/09/20 0652  CHOL 127  HDL 43  LDLCALC 63  TRIG 104  CHOLHDL 3.0   Thyroid function studies No results for input(s): TSH, T4TOTAL, T3FREE, THYROIDAB in the last 72 hours.  Invalid input(s): FREET3 Anemia work up No results for input(s): VITAMINB12, FOLATE, FERRITIN, TIBC, IRON, RETICCTPCT in the last 72 hours. Urinalysis    Component Value Date/Time   COLORURINE STRAW (A) 03/08/2020 1232   APPEARANCEUR CLEAR (A) 03/08/2020 1232   APPEARANCEUR Clear 06/08/2014 2052   LABSPEC 1.041 (H) 03/08/2020 1232   LABSPEC 1.015 06/08/2014 2052   PHURINE 5.0 03/08/2020 1232   GLUCOSEU NEGATIVE 03/08/2020 1232   GLUCOSEU Negative 06/08/2014 2052   HGBUR NEGATIVE 03/08/2020 1232   BILIRUBINUR NEGATIVE 03/08/2020 1232   BILIRUBINUR Negative 06/08/2014 2052   KETONESUR 20 (A) 03/08/2020  [place] 03/08/2020 1232   NITRITE NEGATIVE 03/08/2020 1232   LEUKOCYTESUR NEGATIVE 03/08/2020 1232   LEUKOCYTESUR Negative 06/08/2014 2052   Sepsis Labs Invalid input(s): PROCALCITONIN,  WBC,  LACTICIDVEN Microbiology Recent Results (from the past 240 hour(s))  Resp Panel by RT-PCR (Flu A&B, Covid) Nasopharyngeal Swab     Status: None   Collection Time: 03/08/20  8:57 AM   Specimen: Nasopharyngeal Swab; Nasopharyngeal(NP) swabs in vial transport medium  Result Value Ref Range Status   SARS Coronavirus 2 by RT PCR NEGATIVE NEGATIVE Final    Comment: (NOTE) SARS-CoV-2 target nucleic acids are NOT DETECTED.  The SARS-CoV-2 RNA is generally detectable in upper respiratory specimens during the acute phase of infection. The  lowest concentration of SARS-CoV-2 viral copies this assay can detect is 138 copies/mL. A negative result does not preclude SARS-Cov-2 infection and should not be used as the sole basis for treatment or other patient management decisions. A negative result may occur with  improper specimen collection/handling, submission of specimen other than nasopharyngeal swab, presence of viral mutation(s) within the areas targeted by this assay, and inadequate number of viral copies(<138 copies/mL). A negative result must be combined with clinical observations, patient history, and epidemiological information. The expected result is Negative.  Fact Sheet for Patients:  EntrepreneurPulse.com.au  Fact Sheet for Healthcare Providers:  IncredibleEmployment.be  This test is no t yet approved or cleared by the [place] FDA and  has been authorized for detection and/or diagnosis of SARS-CoV-2 by FDA under an Emergency Use Authorization (EUA). This EUA will remain  in effect (meaning this test can be used) for the duration of the COVID-19 declaration under Section 564(b)(1) of the Act, 21 U.S.C.section 360bbb-3(b)(1), unless the authorization is terminated  or revoked sooner.       Influenza A by PCR NEGATIVE NEGATIVE Final   Influenza B by PCR NEGATIVE NEGATIVE Final    Comment: (NOTE) The Xpert Xpress SARS-CoV-2/FLU/RSV plus assay is intended as an aid in the diagnosis of influenza from Nasopharyngeal swab specimens and should not be used as a sole basis for treatment. Nasal washings and aspirates are unacceptable for Xpert Xpress SARS-CoV-2/FLU/RSV testing.  Fact Sheet for Patients: EntrepreneurPulse.com.au  Fact Sheet for Healthcare Providers: IncredibleEmployment.be  This test is not yet approved or cleared by the [place] FDA and has been authorized for detection and/or diagnosis of SARS-CoV-2 by FDA under  an Emergency Use Authorization (EUA). This EUA will remain in effect (meaning this test can be used) for the duration of the COVID-19 declaration under Section 564(b)(1) of the Act, 21 U.S.C. section 360bbb-3(b)(1), unless the authorization is terminated or revoked.  Performed at [facility], [address], [place] [address]   C Difficile Quick Screen w PCR reflex     Status: None   Collection Time: 03/09/20 12:14 PM   Specimen: STOOL  Result Value Ref Range Status   C Diff antigen NEGATIVE NEGATIVE Final   C Diff toxin NEGATIVE NEGATIVE Final   C Diff interpretation No C. difficile detected.  Final    Comment: Performed at [facility], [address] [place], [place] [address]     Time coordinating discharge: Over 30 minutes  SIGNED:   Ezekiel Slocumb, DO [facility] 03/11/2020, 12:19 PM   If 7PM-7AM, please contact night-coverage [email or web address]

## 2020-03-18 NOTE — Progress Notes (Signed)
[name] ID: Raven [name], female    DOB: Aug 18, 1934, 85 y.o.   MRN: [id number]  HPI  Raven [name] is a 86 y/o female with a history of asthma, hyperlipidemia, HTN, COPD, CAD (MI), anemia, depression, previous tobacco use and chronic heart failure.   Echo report from 03/10/19 reviewed and showed an EF of 45-50%.  Echo report from 08/26/19 reviewed and showed an EF of 40-45% along with mildly elevated PA pressure and moderate MR.   Admitted 03/08/20 due to shortness of breath along with pedal edema. Initially needed 6L nasal cannula and then decreased back down to 2L. Elevated troponin thought to be due to demand ischemia. Cardiology consult obtained. Initially given IV lasix with transition to oral diuretics. Hypokalemia repleted. IV hydralazine given for HTN. Discharged after 3 days.   Raven [name] presents today for a follow-up visit with a chief complaint of moderate fatigue with very little exertion. Raven [name] says that this has been chronic in nature having been present for several years with varying levels of severity. Raven [name] notes that Raven [name]'s more tired since her recent admission. Raven [name] has associated cough, shortness of breath and difficulty sleeping along with this. Raven [name] denies any dizziness, abdominal distention, palpitations, pedal edema, chest pain, wheezing or weight gain.   Says that over Raven Christmas holiday, Raven [name] ate foods that probably contained more salt that Raven [name]'s accustomed to. Will be getting ready to start home PT and Raven [name]'s hoping that will help her regain her strength. Feels a little depressed with all of this and with how difficult it's been to bounce back.   Past Medical History:  Diagnosis Date  . Anemia   . Arthritis   . Asthma   . CHF (congestive heart failure) ([place])   . Complication of anesthesia   . COPD (chronic obstructive pulmonary disease) ([place])   . Crohn's disease ([place])   . Depression    after death of husband  . Hyperlipidemia   . Hypertension   . Myocardial infarction ([place])   .  Osteopenia   . Peptic ulcer disease   . Pneumonia   . PONV (postoperative nausea and vomiting)    Past Surgical History:  Procedure Laterality Date  . ABDOMINAL SURGERY    . CARDIAC CATHETERIZATION     with stent  . COLONOSCOPY WITH PROPOFOL N/A 09/27/2014   Procedure: COLONOSCOPY WITH PROPOFOL;  Surgeon: [name], MD;  Location: [facility] ENDOSCOPY;  Service: Gastroenterology;  Laterality: N/A;  . COLONOSCOPY WITH PROPOFOL N/A 06/21/2019   Procedure: COLONOSCOPY WITH PROPOFOL;  Surgeon: Lin Landsman, MD;  Location: [facility] ENDOSCOPY;  Service: Gastroenterology;  Laterality: N/A;  . EYE SURGERY     bilateral cataract surgeries  . SHOULDER SURGERY Right    x 2   . SMALL INTESTINE SURGERY     Family History  Problem Relation Age of Onset  . Hypertension Other    Social History   Tobacco Use  . Smoking status: Former Smoker    Quit date: 1974    Years since quitting: 48.0  . Smokeless tobacco: Never Used  Substance Use Topics  . Alcohol use: Yes    Comment: occassional   Allergies  Allergen Reactions  . Penicillins Other (See Comments)    Reaction:  Unknown  Has [name] had a PCN reaction causing immediate rash, facial/tongue/throat swelling, SOB or lightheadedness with hypotension: unknown Has [name] had a PCN reaction causing severe rash involving mucus membranes or skin necrosis: unknown Has [name] had a PCN reaction that  required hospitalization No Has [name] had a PCN reaction occurring within Raven last 10 years: No If all of Raven above answers are "NO", then may proceed with Cephalosporin use.   . Augmentin [Amoxicillin-Pot Clavulanate] Other (See Comments)    Reaction:  Unknown   . Indocin [Indomethacin] Other (See Comments)    Reaction:  Unknown   . Lodine [Etodolac] Other (See Comments)    Reaction:   GI upset   . Methotrexate Derivatives Other (See Comments)    Reaction:  GI upset   . Gold Rash  . Zantac [Ranitidine Hcl] Rash   Prior to Admission  medications   Medication Sig Start Date End Date Taking? Authorizing Provider  acetaminophen (TYLENOL) 500 MG tablet Take 500-1,000 mg by mouth every 6 (six) hours as needed for mild pain, moderate pain or fever.   Yes [provider]  Adalimumab 40 MG/0.8ML PSKT Inject 40 mg into Raven skin every 30 (thirty) days.    Yes [provider]  albuterol (PROVENTIL HFA;VENTOLIN HFA) 108 (90 BASE) MCG/ACT inhaler Inhale 2 puffs into Raven lungs every 6 (six) hours as needed for wheezing or shortness of breath.   Yes [provider]  amitriptyline (ELAVIL) 10 MG tablet Take 20 mg by mouth at bedtime.   Yes [provider]  ANORO ELLIPTA 62.5-25 MCG/INH AEPB Inhale 1 puff into Raven lungs daily. 05/29/19  Yes [provider]  aspirin EC 81 MG tablet Take 81 mg by mouth daily.   Yes [provider]  atorvastatin (LIPITOR) 80 MG tablet Take 80 mg by mouth daily.   Yes [provider]  Calcium-Phosphorus-Vitamin D (CITRACAL +D3 PO) Take 1 tablet by mouth daily.   Yes [provider]  Cobalamin Combinations (VITAMIN B12-FOLIC ACID) 458-099 MCG TABS Take 1 tablet by mouth as directed.   Yes [provider]  dextromethorphan-guaiFENesin (MUCINEX DM) 30-600 MG 12hr tablet Take 1 tablet by mouth 2 (two) times daily as needed for cough. 03/11/20  Yes Nicole Kindred A, DO  ENTRESTO 24-26 MG TAKE ONE TABLET BY MOUTH TWICE DAILY [name] taking differently: Take 1 tablet by mouth 2 (two) times daily. 02/29/20  Yes Jeanita Carneiro, Otila Kluver A, FNP  esomeprazole (NEXIUM) 40 MG capsule Take 40 mg by mouth daily. 05/29/19  Yes [provider]  folic acid (FOLVITE) 833 MCG tablet Take 400 mcg by mouth daily.   Yes [provider]  furosemide (LASIX) 40 MG tablet Take 1 tablet (40 mg total) by mouth 2 (two) times daily. 11/26/19  Yes Wieting, Richard, MD  leflunomide (ARAVA) 10 MG tablet Take 10 mg by mouth daily. 03/29/19  Yes [provider]   mesalamine (PENTASA) 500 MG CR capsule Take 1,000 mg by mouth 2 (two) times daily.   Yes [provider]  metoprolol succinate (TOPROL-XL) 50 MG 24 hr tablet Take 50 mg by mouth daily.    Yes [provider]  Multiple Vitamin (MULTIVITAMIN WITH MINERALS) TABS tablet Take 1 tablet by mouth daily.   Yes [provider]  potassium chloride (KLOR-CON) 10 MEQ tablet Take 10 mEq by mouth daily.   Yes [provider]  predniSONE (DELTASONE) 5 MG tablet Take 3 tablets (15 mg total) by mouth daily with breakfast. 03/11/20  Yes Nicole Kindred A, DO  thiamine (VITAMIN B-1) 100 MG tablet Take 100 mg by mouth daily.   Yes [provider]  vitamin E 400 UNIT capsule Take 400 Units by mouth daily.   Yes [provider]    Review of Systems  Constitutional: Positive for fatigue ("all Raven time"). Negative for appetite change and fever.  HENT: Negative for congestion, postnasal drip and sore throat.   Eyes: Negative.   Respiratory: Positive for cough and shortness of breath. Negative for wheezing.   Cardiovascular: Negative for chest pain, palpitations and leg swelling.  Gastrointestinal: Negative for abdominal distention and abdominal pain.  Endocrine: Negative.   Genitourinary: Negative.   Musculoskeletal: Positive for arthralgias (left wrist). Negative for back pain.  Skin: Negative.   Allergic/Immunologic: Negative.   Neurological: Negative for dizziness and light-headedness.  Hematological: Negative for adenopathy. Bruises/bleeds easily.  Psychiatric/Behavioral: Positive for dysphoric mood and sleep disturbance (intermittently; sleeping on 1 pillow). Raven [name] is not nervous/anxious.    Vitals:   03/19/20 1100  BP: (!) 143/67  Pulse: 73  Resp: 20  SpO2: 96%  Weight: 162 lb 8 oz (73.7 kg)  Height: 5' 6"  (1.676 m)   Wt Readings from Last 3 Encounters:  03/19/20 162 lb 8 oz (73.7 kg)  03/10/20 158 lb 6.4 oz (71.8 kg)  02/15/20 165 lb 2 oz  (74.9 kg)   Lab Results  Component Value Date   CREATININE 1.06 (H) 03/11/2020   CREATININE 0.72 03/10/2020   CREATININE 0.75 03/09/2020    Physical Exam Vitals and nursing note reviewed.  Constitutional:      Appearance: Raven [name] is well-developed.  HENT:     Head: Normocephalic and atraumatic.  Neck:     Vascular: No JVD.  Cardiovascular:     Rate and Rhythm: Normal rate and regular rhythm.  Pulmonary:     Effort: Pulmonary effort is normal. No respiratory distress.     Breath sounds: No wheezing or rales.  Abdominal:     Palpations: Abdomen is soft.     Tenderness: There is no abdominal tenderness.  Musculoskeletal:        General: No tenderness.     Cervical back: Neck supple.     Right lower leg: No tenderness. Edema (trace pitting) present.     Left lower leg: No tenderness. Edema (trace pitting) present.  Skin:    General: Skin is warm and dry.  Neurological:     General: No focal deficit present.     Mental Status: Raven [name] is alert and oriented to person, place, and time.  Psychiatric:        Mood and Affect: Mood normal.        Behavior: Behavior normal.    Assessment & Plan:  1: Chronic heart failure with reduced ejection fraction- - NYHA class III - euvolemic today - weighing daily; reminded to call for an overnight weight gain of >2 pounds or a weekly weight gain of >5 pounds - weight down ~ 3 pounds from last visit here 1 month ago - using Mrs Deliah Boston seasoning on her foods and tries to look at food labels; does admit to dietary indiscretion over Raven Christmas holiday - saw cardiology (Paraschos) 03/04/20 - BNP 03/08/20 was 501.8 - reports receiving both COVID vaccines & [place] flu vaccine for this season - home PT getting ready to begin  2: HTN- - BP mildly elevated today (143/67); was 124/78 at PCP's office yesterday - saw PCP Edwina Barth) 03/18/20 - BMP 03/18/20 reviewed and showed sodium 143, potassium 4.4, creatinine 1.0 and GFR 53  3: COPD- - saw pulmonology  Raul Del) 12/07/19 & returns later this month - wearing oxygen at 3L QHS and PRN during Raven day  4: Lymphedema- -  stage 2 - home PT getting ready to start - trying to elevate her legs more often with improvement in her swelling - edema looks better today   Medication list reviewed.   Return in 5 months or sooner for any questions/problems before then.

## 2020-03-19 ENCOUNTER — Encounter: Payer: Self-pay | Admitting: Family

## 2020-03-19 ENCOUNTER — Ambulatory Visit: Payer: Medicare Other | Attending: Family | Admitting: Family

## 2020-03-19 ENCOUNTER — Other Ambulatory Visit: Payer: Self-pay

## 2020-03-19 VITALS — BP 143/67 | HR 73 | Resp 20 | Ht 66.0 in | Wt 162.5 lb

## 2020-03-19 DIAGNOSIS — E785 Hyperlipidemia, unspecified: Secondary | ICD-10-CM | POA: Insufficient documentation

## 2020-03-19 DIAGNOSIS — D649 Anemia, unspecified: Secondary | ICD-10-CM | POA: Insufficient documentation

## 2020-03-19 DIAGNOSIS — R0602 Shortness of breath: Secondary | ICD-10-CM | POA: Diagnosis present

## 2020-03-19 DIAGNOSIS — I110 Hypertensive heart disease with heart failure: Secondary | ICD-10-CM | POA: Insufficient documentation

## 2020-03-19 DIAGNOSIS — Z7982 Long term (current) use of aspirin: Secondary | ICD-10-CM | POA: Insufficient documentation

## 2020-03-19 DIAGNOSIS — I5022 Chronic systolic (congestive) heart failure: Secondary | ICD-10-CM | POA: Diagnosis not present

## 2020-03-19 DIAGNOSIS — J449 Chronic obstructive pulmonary disease, unspecified: Secondary | ICD-10-CM

## 2020-03-19 DIAGNOSIS — I10 Essential (primary) hypertension: Secondary | ICD-10-CM

## 2020-03-19 DIAGNOSIS — I890 Lymphedema, not elsewhere classified: Secondary | ICD-10-CM | POA: Diagnosis not present

## 2020-03-19 DIAGNOSIS — F329 Major depressive disorder, single episode, unspecified: Secondary | ICD-10-CM | POA: Diagnosis not present

## 2020-03-19 DIAGNOSIS — Z79899 Other long term (current) drug therapy: Secondary | ICD-10-CM | POA: Insufficient documentation

## 2020-03-19 DIAGNOSIS — Z87891 Personal history of nicotine dependence: Secondary | ICD-10-CM | POA: Diagnosis not present

## 2020-03-19 DIAGNOSIS — I252 Old myocardial infarction: Secondary | ICD-10-CM | POA: Insufficient documentation

## 2020-03-19 DIAGNOSIS — I2510 Atherosclerotic heart disease of native coronary artery without angina pectoris: Secondary | ICD-10-CM | POA: Insufficient documentation

## 2020-03-19 NOTE — Patient Instructions (Signed)
Continue weighing daily and call for an overnight weight gain of > 2 pounds or a weekly weight gain of >5 pounds. 

## 2020-05-13 ENCOUNTER — Telehealth: Payer: Self-pay | Admitting: Family

## 2020-05-13 NOTE — Telephone Encounter (Signed)
Returned patient's call regarding her leg swelling that she had noticed. She said that yesterday both legs were swollen but that this morning, they looked better. However, after she ate cereal and had them down, the swelling returned. She says that she's been diligent in watching her sodium intake.   She has compression socks at home but hasn't been wearing them. Currently taking furosemide 77m BID along with potassium 118m daily. Last lab work on 03/18/20 showed potassium of 4.4 and GFR 53.   Advised patient to put the compression socks on and elevate her legs. Instructed to wear the compression socks daily from morning until bedtime. She can take an additional furosemide today and we have scheduled her an appointment tomorrow morning at 10:00am.   Patient verbalized understanding and was appreciative of the call back.

## 2020-05-13 NOTE — Progress Notes (Signed)
Patient ID: Raven [name], female    DOB: Dec 04, 1934, 86 y.o.   MRN: [id number]  HPI  Raven [name] is a 86 y/o female with a history of asthma, hyperlipidemia, HTN, COPD, CAD (MI), anemia, depression, previous tobacco use and chronic heart failure.   Echo report from 03/09/20 reviewed and showed an EF of 45-50%.  Echo report from 08/26/19 reviewed and showed an EF of 40-45% along with mildly elevated PA pressure and moderate MR.   Admitted 03/08/20 due to shortness of breath along with pedal edema. Initially needed 6L nasal cannula and then decreased back down to 2L. Elevated troponin thought to be due to demand ischemia. Cardiology consult obtained. Initially given IV lasix with transition to oral diuretics. Hypokalemia repleted. IV hydralazine given for HTN. Discharged after 3 days.   She presents today for an acute visit with a chief complaint of worsening pedal edema. She says that her edema has been worsening over the last few days. She called into the office yesterday and was instructed to take an additional 3m furosemide but she says that she didn't take any diuretic yesterday afternoon because she fell asleep and woke up ~ 6pm and knew she didn't want to take it that late so only took 464mfurosemide yesterday morning. She says that the swelling today is surprisingly not any worse than yesterday.   She has associated fatigue, cough, shortness of breath, easy bruising, difficulty sleeping and gradual weight gain along with this. She denies any dizziness, abdominal distention, palpitations, chest pain or wheezing.   She says that she drinks 3-4 Yeti cups of water daily but is unsure of how many ounces the cup holds.   Past Medical History:  Diagnosis Date  . Anemia   . Arthritis   . Asthma   . CH[place] (congestive heart failure) ([place]  . Complication of anesthesia   . COPD [place] disease) ([place]  . Crohn's disease ([place]  . Depression    after death of husband  .  Hyperlipidemia   . Hypertension   . [place]ocardial infarction ([place]  . Osteopenia   . Peptic ulcer disease   . Pneumonia   . PONV (postoperative nausea and vomiting)    Past Surgical History:  Procedure Laterality Date  . ABDOMINAL SURGERY    . CARDIAC CATHETERIZATION     with stent  . COLONOSCOPY WITH PROPOFOL N/A 09/27/2014   Procedure: COLONOSCOPY WITH PROPOFOL;  Surgeon: PaHulen LusterMD;  [facility];  Service: Gastroenterology;  Laterality: N/A;  . COLONOSCOPY WITH PROPOFOL N/A 06/21/2019   Procedure: COLONOSCOPY WITH PROPOFOL;  Surgeon: VaLin LandsmanMD;  [facility];  Service: Gastroenterology;  Laterality: N/A;  . EYE SURGERY     bilateral cataract surgeries  . SHOULDER SURGERY Right    x 2   . SMALL INTESTINE SURGERY     Family History  Problem Relation Age of Onset  . Hypertension Other    Social History   Tobacco Use  . Smoking status: Former Smoker    Quit date: 1974    Years since quitting: 48.2  . Smokeless tobacco: Never Used  Substance Use Topics  . Alcohol use: Yes    Comment: occassional   Allergies  Allergen Reactions  . Penicillins Other (See Comments)    Reaction:  Unknown  Has patient had a PCN reaction causing immediate rash, facial/tongue/throat swelling, SOB or lightheadedness with hypotension: unknown Has patient had a PCN reaction  causing severe rash involving mucus membranes or skin necrosis: unknown Has patient had a PCN reaction that required hospitalization No Has patient had a PCN reaction occurring within the last 10 years: No If all of the above answers are "NO", then may proceed with Cephalosporin use.   . Augmentin [Amoxicillin-Pot Clavulanate] Other (See Comments)    Reaction:  Unknown   . Indocin [Indomethacin] Other (See Comments)    Reaction:  Unknown   . Lodine [Etodolac] Other (See Comments)    Reaction:   GI upset   . Methotrexate Derivatives Other (See Comments)    Reaction:  GI upset   .  Gold Rash  . Zantac [Ranitidine Hcl] Rash   Prior to Admission medications   Medication Sig Start Date End Date Taking? Authorizing Provider  acetaminophen (TYLENOL) 500 MG tablet Take 500-1,000 mg by mouth every 6 (six) hours as needed for mild pain, moderate pain or fever.   Yes [provider]  Adalimumab 40 MG/0.8ML PSKT Inject 40 mg into the skin every 30 (thirty) days.    Yes [provider]  albuterol (PROVENTIL HFA;VENTOLIN HFA) 108 (90 BASE) MCG/ACT inhaler Inhale 2 puffs into the lungs every 6 (six) hours as needed for wheezing or shortness of breath.   Yes [provider]  amitriptyline (ELAVIL) 10 MG tablet Take 20 mg by mouth at bedtime.   Yes [provider]  ANORO ELLIPTA 62.5-25 MCG/INH AEPB Inhale 1 puff into the lungs daily. 05/29/19  Yes [provider]  aspirin EC 81 MG tablet Take 81 mg by mouth daily.   Yes [provider]  atorvastatin (LIPITOR) 80 MG tablet Take 80 mg by mouth daily.   Yes [provider]  Calcium-Phosphorus-Vitamin D (CITRACAL +D3 PO) Take 1 tablet by mouth daily.   Yes [provider]  Cobalamin Combinations (VITAMIN B12-FOLIC ACID) 361-224 MCG TABS Take 1 tablet by mouth as directed.   Yes [provider]  dextromethorphan-guaiFENesin (MUCINEX DM) 30-600 MG 12hr tablet Take 1 tablet by mouth 2 (two) times daily as needed for cough. 03/11/20  Yes Nicole Kindred A, DO  ENTRESTO 24-26 MG TAKE ONE TABLET BY MOUTH TWICE DAILY Patient taking differently: Take 1 tablet by mouth 2 (two) times daily. 02/29/20  Yes Rishikesh Khachatryan, Otila Kluver A, FNP  esomeprazole (NEXIUM) 40 MG capsule Take 40 mg by mouth daily. 05/29/19  Yes [provider]  folic acid (FOLVITE) 497 MCG tablet Take 400 mcg by mouth daily.   Yes [provider]  furosemide (LASIX) 40 MG tablet Take 1 tablet (40 mg total) by mouth 2 (two) times daily. 11/26/19  Yes Wieting, Richard, MD  leflunomide (ARAVA) 10 MG  tablet Take 10 mg by mouth daily. 03/29/19  Yes [provider]  mesalamine (PENTASA) 500 MG CR capsule Take 1,000 mg by mouth 2 (two) times daily.   Yes [provider]  metoprolol succinate (TOPROL-XL) 50 MG 24 hr tablet Take 50 mg by mouth daily.    Yes [provider]  Multiple Vitamin (MULTIVITAMIN WITH MINERALS) TABS tablet Take 1 tablet by mouth daily.   Yes [provider]  potassium chloride (KLOR-CON) 10 MEQ tablet Take 10 mEq by mouth daily.   Yes [provider]  predniSONE (DELTASONE) 5 MG tablet Take 3 tablets (15 mg total) by mouth daily with breakfast. 03/11/20  Yes Nicole Kindred A, DO  thiamine (VITAMIN B-1) 100 MG tablet Take 100 mg by mouth daily.   Yes [provider]  vitamin E 400 UNIT capsule Take 400 Units by mouth daily.   Yes [provider]    Review of Systems  Constitutional: Positive for fatigue ("all the time"). Negative for appetite change and fever.  HENT: Negative for congestion, postnasal drip and sore throat.   Eyes: Negative.   Respiratory: Positive for cough and shortness of breath. Negative for wheezing.   Cardiovascular: Positive for leg swelling. Negative for chest pain and palpitations.  Gastrointestinal: Negative for abdominal distention and abdominal pain.  Endocrine: Negative.   Genitourinary: Negative.   Musculoskeletal: Positive for arthralgias (left wrist). Negative for back pain.  Skin: Negative.   Allergic/Immunologic: Negative.   Neurological: Negative for dizziness and light-headedness.  Hematological: Negative for adenopathy. Bruises/bleeds easily.  Psychiatric/Behavioral: Positive for dysphoric mood and sleep disturbance (intermittently; sleeping on 1 pillow). The patient is not nervous/anxious.    Vitals:   05/14/20 0956  BP: (!) 147/85  Pulse: 88  Resp: 18  SpO2: 92%  Weight: 172 lb 4 oz (78.1 kg)  Height: 5' 6"  (1.676 m)   Wt Readings from Last 3 Encounters:   05/14/20 172 lb 4 oz (78.1 kg)  03/19/20 162 lb 8 oz (73.7 kg)  03/10/20 158 lb 6.4 oz (71.8 kg)   Lab Results  Component Value Date   CREATININE 1.06 (H) 03/11/2020   CREATININE 0.72 03/10/2020   CREATININE 0.75 03/09/2020    Physical Exam Vitals and nursing note reviewed.  Constitutional:      Appearance: She is well-developed.  HENT:     Head: Normocephalic and atraumatic.  Neck:     Vascular: No JVD.  Cardiovascular:     Rate and Rhythm: Normal rate and regular rhythm.  Pulmonary:     Effort: Pulmonary effort is normal. No respiratory distress.     Breath sounds: No wheezing or rales.  Abdominal:     Palpations: Abdomen is soft.     Tenderness: There is no abdominal tenderness.  Musculoskeletal:        General: No tenderness.     Cervical back: Neck supple.     Right lower leg: No tenderness. Edema (1+ pitting) present.     Left lower leg: No tenderness. Edema (1+ pitting) present.  Skin:    General: Skin is warm and dry.  Neurological:     General: No focal deficit present.     Mental Status: She is alert and oriented to person, place, and time.  Psychiatric:        Mood and Affect: Mood normal.        Behavior: Behavior normal.    Assessment & Plan:  1: Chronic heart failure with reduced ejection fraction- - NYHA class III - euvolemic today - weighing daily; reminded to call for an overnight weight gain of >2 pounds or a weekly weight gain of >5 pounds - weight up 10 pounds from last visit here 1.5 month ago - referral made to pulmonary rehab after discussing with patient; she says that she would like to begin exercising - will increase her furosemide to 25m AM/ 464mPM - will also increase her potassium to 1071mBID - she gets her medication packaged monthly from her pharmacy so 10 day supplies of these extras were sent in - consider changing diuretic to torsemide if this increase doesn't help - will check BMP next week - using Mrs DasDeliah Bostonasoning on  her foods and tries to look at food labels - she is to measure how many ounces her  Yeti cup holds as she's drinking 3-4 / daily; explained that she needed to maintain daily fluid intake to ~ 60 ounces daily - saw cardiology (Paraschos) 03/04/20 - BNP 03/08/20 was 501.8 - reports receiving both COVID vaccines & the flu vaccine for this season  2: HTN- - BP mildly elevated today; increasing diuretic per above - saw PCP Edwina Barth) 03/18/20 - BMP 03/18/20 reviewed and showed sodium 143, potassium 4.4, creatinine 1.0 and GFR 53  3: COPD- - saw pulmonology Raul Del) 04/08/20 - wearing oxygen at 1L QHS and PRN during the day  4: Lymphedema- - stage 2 - home PT finished - trying to elevate her legs more often - wearing zippered compression socks but swelling has recently worsened - adjusting diuretic per above - consider compression boots if this persists   Medication list reviewed.   Return in 1 week or sooner for any questions/problems before then.

## 2020-05-14 ENCOUNTER — Encounter: Payer: Self-pay | Admitting: Family

## 2020-05-14 ENCOUNTER — Ambulatory Visit: Payer: Medicare Other | Attending: Family | Admitting: Family

## 2020-05-14 ENCOUNTER — Other Ambulatory Visit: Payer: Self-pay

## 2020-05-14 VITALS — BP 147/85 | HR 88 | Resp 18 | Ht 66.0 in | Wt 172.2 lb

## 2020-05-14 DIAGNOSIS — I5022 Chronic systolic (congestive) heart failure: Secondary | ICD-10-CM | POA: Diagnosis not present

## 2020-05-14 DIAGNOSIS — I890 Lymphedema, not elsewhere classified: Secondary | ICD-10-CM | POA: Insufficient documentation

## 2020-05-14 DIAGNOSIS — Z87891 Personal history of nicotine dependence: Secondary | ICD-10-CM | POA: Insufficient documentation

## 2020-05-14 DIAGNOSIS — Z79899 Other long term (current) drug therapy: Secondary | ICD-10-CM | POA: Diagnosis not present

## 2020-05-14 DIAGNOSIS — Z880 Allergy status to penicillin: Secondary | ICD-10-CM | POA: Diagnosis not present

## 2020-05-14 DIAGNOSIS — I110 Hypertensive heart disease with heart failure: Secondary | ICD-10-CM | POA: Diagnosis not present

## 2020-05-14 DIAGNOSIS — J449 Chronic obstructive pulmonary disease, unspecified: Secondary | ICD-10-CM | POA: Diagnosis not present

## 2020-05-14 DIAGNOSIS — E785 Hyperlipidemia, unspecified: Secondary | ICD-10-CM | POA: Diagnosis not present

## 2020-05-14 DIAGNOSIS — I252 Old myocardial infarction: Secondary | ICD-10-CM | POA: Insufficient documentation

## 2020-05-14 DIAGNOSIS — Z8249 Family history of ischemic heart disease and other diseases of the circulatory system: Secondary | ICD-10-CM | POA: Diagnosis not present

## 2020-05-14 DIAGNOSIS — F32A Depression, unspecified: Secondary | ICD-10-CM | POA: Diagnosis not present

## 2020-05-14 DIAGNOSIS — Z881 Allergy status to other antibiotic agents status: Secondary | ICD-10-CM | POA: Insufficient documentation

## 2020-05-14 DIAGNOSIS — Z9981 Dependence on supplemental oxygen: Secondary | ICD-10-CM | POA: Insufficient documentation

## 2020-05-14 DIAGNOSIS — Z7982 Long term (current) use of aspirin: Secondary | ICD-10-CM | POA: Diagnosis not present

## 2020-05-14 DIAGNOSIS — Z634 Disappearance and death of family member: Secondary | ICD-10-CM | POA: Diagnosis not present

## 2020-05-14 DIAGNOSIS — I10 Essential (primary) hypertension: Secondary | ICD-10-CM

## 2020-05-14 DIAGNOSIS — Z7952 Long term (current) use of systemic steroids: Secondary | ICD-10-CM | POA: Diagnosis not present

## 2020-05-14 MED ORDER — FUROSEMIDE 40 MG PO TABS: 40.0000 mg | ORAL_TABLET | Freq: Every day | ORAL | 0 refills | Status: DC

## 2020-05-14 MED ORDER — POTASSIUM CHLORIDE ER 10 MEQ PO TBCR: 10.0000 meq | EXTENDED_RELEASE_TABLET | Freq: Every day | ORAL | 0 refills | Status: DC

## 2020-05-14 NOTE — Patient Instructions (Addendum)
Continue weighing daily and call for an overnight weight gain of > 2 pounds or a weekly weight gain of >5 pounds.   Drink about 60 ounces of fluid daily   Take additional 72m furosemide and additional 196m potassium daily to what you are already taking

## 2020-05-23 ENCOUNTER — Ambulatory Visit
Admission: RE | Admit: 2020-05-23 | Discharge: 2020-05-23 | Disposition: A | Discharge status: Home / Self Care | Payer: Medicare Other | Source: Ambulatory Visit | Attending: Family | Admitting: Family

## 2020-05-23 ENCOUNTER — Other Ambulatory Visit: Payer: Self-pay

## 2020-05-23 ENCOUNTER — Other Ambulatory Visit: Payer: Self-pay | Admitting: Family

## 2020-05-23 ENCOUNTER — Ambulatory Visit: Payer: Medicare Other | Admitting: Family

## 2020-05-23 ENCOUNTER — Encounter: Payer: Self-pay | Admitting: Family

## 2020-05-23 VITALS — BP 146/69 | HR 82 | Resp 22 | Ht 66.0 in | Wt 176.1 lb

## 2020-05-23 DIAGNOSIS — I890 Lymphedema, not elsewhere classified: Secondary | ICD-10-CM | POA: Insufficient documentation

## 2020-05-23 DIAGNOSIS — I110 Hypertensive heart disease with heart failure: Secondary | ICD-10-CM | POA: Diagnosis not present

## 2020-05-23 DIAGNOSIS — I10 Essential (primary) hypertension: Secondary | ICD-10-CM | POA: Insufficient documentation

## 2020-05-23 DIAGNOSIS — J449 Chronic obstructive pulmonary disease, unspecified: Secondary | ICD-10-CM | POA: Insufficient documentation

## 2020-05-23 DIAGNOSIS — I5023 Acute on chronic systolic (congestive) heart failure: Secondary | ICD-10-CM

## 2020-05-23 DIAGNOSIS — Z87891 Personal history of nicotine dependence: Secondary | ICD-10-CM | POA: Diagnosis not present

## 2020-05-23 LAB — BRAIN NATRIURETIC PEPTIDE: B Natriuretic Peptide: 4388.6 pg/mL — ABNORMAL HIGH (ref 0.0–100.0)

## 2020-05-23 LAB — BASIC METABOLIC PANEL
Anion gap: 9 (ref 5–15)
BUN: 22 mg/dL (ref 8–23)
CO2: 22 mmol/L (ref 22–32)
Calcium: 8 mg/dL — ABNORMAL LOW (ref 8.9–10.3)
Chloride: 113 mmol/L — ABNORMAL HIGH (ref 98–111)
Creatinine, Ser: 0.82 mg/dL (ref 0.44–1.00)
GFR, Estimated: >60 mL/min (ref >60)
Glucose, Bld: 135 mg/dL — ABNORMAL HIGH (ref 70–99)
Potassium: 3.3 mmol/L — ABNORMAL LOW (ref 3.5–5.1)
Sodium: 144 mmol/L (ref 135–145)

## 2020-05-23 MED ORDER — FUROSEMIDE 10 MG/ML IJ SOLN
INTRAMUSCULAR | Status: AC
  Administered 2020-05-23: 80 mg via INTRAVENOUS
  Filled 2020-05-23: qty 8

## 2020-05-23 MED ORDER — TORSEMIDE 20 MG PO TABS: 40.0000 mg | ORAL_TABLET | Freq: Two times a day (BID) | ORAL | 3 refills | Status: DC

## 2020-05-23 MED ORDER — POTASSIUM CHLORIDE CRYS ER 20 MEQ PO TBCR: 40.0000 meq | EXTENDED_RELEASE_TABLET | Freq: Once | ORAL | Status: AC

## 2020-05-23 MED ORDER — FUROSEMIDE 10 MG/ML IJ SOLN: 80.0000 mg | Freq: Once | INTRAMUSCULAR | Status: AC

## 2020-05-23 MED ORDER — POTASSIUM CHLORIDE CRYS ER 20 MEQ PO TBCR
EXTENDED_RELEASE_TABLET | ORAL | Status: AC
  Administered 2020-05-23: 40 meq via ORAL
  Filled 2020-05-23: qty 2

## 2020-05-23 NOTE — Patient Instructions (Addendum)
Continue weighing daily and call for an overnight weight gain of > 2 pounds or a weekly weight gain of >5 pounds.   Will be changing your furosemide to torsemide 8m twice a day (2 tablets twice a day)   [facility] [address] [id number]([phone]/18/2022 at 11AM

## 2020-05-23 NOTE — Progress Notes (Signed)
Patient ID: Raven [name], female    DOB: Mar 07, 1935, 86 y.o.   MRN: [id number]  HPI  Ms Raven [name] is a 86 y/o female with a history of asthma, hyperlipidemia, HTN, COPD, CAD (MI), anemia, depression, previous tobacco use and chronic heart failure.   Echo report from 03/09/20 reviewed and showed an EF of 45-50%.  Echo report from 08/26/19 reviewed and showed an EF of 40-45% along with mildly elevated PA pressure and moderate MR.   Admitted 03/08/20 due to shortness of breath along with pedal edema. Initially needed 6L nasal cannula and then decreased back down to 2L. Elevated troponin thought to be due to demand ischemia. Cardiology consult obtained. Initially given IV lasix with transition to oral diuretics. Hypokalemia repleted. IV hydralazine given for HTN. Discharged after 3 days.   She presents today for a follow-up visit with a chief complaint of moderate shortness of breath with little exertion. She describes this as chronic in nature having been present for several years although she reports worsening shortness of breath over the last few days which she contributes to the weather change this week. She has associated fatigue, productive cough, pedal edema, light-headedness, depression, difficulty sleeping and slight weight gain. She denies any abdominal distention, palpitations, chest pain or wheezing.   Furosemide was increased at last visit to 74m AM/ 468mPM and she doesn't feel like the increased dose has helped much. Potassium was also increased to 2068mdaily as well.   She arrived on room air without her oxygen with her and her initial room air sat was 78%. She was placed on oxygen at 2L and she quickly rose to 98%. She overall says that she just doesn't feel well and hasn't felt well all week but she was trying to "tough it out".   Past Medical History:  Diagnosis Date  . Anemia   . Arthritis   . Asthma   . CH[place] ([place]ive heart failure) ([facility] . Complication of anesthesia   .  COPD (chronic obstructive pulmonary disease) ([facility] . Cro[place]'s disease ([facility] [place] . Depression    after death of husband  . Hyperlipidemia   . Hypertension   . [place] . Osteopenia   . Peptic ulcer disease   . Pneumonia   . PONV (postoperative nausea and vomiting)    Past Surgical History:  Procedure Laterality Date  . ABDOMINAL SURGERY    . CARDIAC CATHETERIZATION     with stent  . COLONOSCOPY WITH PROPOFOL N/A 09/27/2014   Procedure: COLONOSCOPY WITH PROPOFOL;  Surgeon: PauHulen LusterD;  [facility];  Service: Gastroenterology;  Laterality: N/A;  . COLONOSCOPY WITH PROPOFOL N/A 06/21/2019   Procedure: COLONOSCOPY WITH PROPOFOL;  Surgeon: VanLin LandsmanD;  [facility];  Service: Gastroenterology;  Laterality: N/A;  . EYE SURGERY     bilateral cataract surgeries  . SHOULDER SURGERY Right    x 2   . SMALL INTESTINE SURGERY     Family History  Problem Relation Age of Onset  . Hypertension Other    Social History   Tobacco Use  . Smoking status: Former Smoker    Quit date: 1974    Years since quitting: 48.2  . Smokeless tobacco: Never Used  Substance Use Topics  . Alcohol use: Yes    Comment: occassional   Allergies  Allergen Reactions  . Penicillins Other (See Comments)    Reaction:  Unknown  Has patient had  a PCN reaction causing immediate rash, facial/tongue/throat swelling, SOB or lightheadedness with hypotension: unknown Has patient had a PCN reaction causing severe rash involving mucus membranes or skin necrosis: unknown Has patient had a PCN reaction that required hospitalization No Has patient had a PCN reaction occurring within the last 10 years: No If all of the above answers are "NO", then may proceed with Cephalosporin use.   . Augmentin [Amoxicillin-Pot Clavulanate] Other (See Comments)    Reaction:  Unknown   . Indocin [Indomethacin] Other (See Comments)    Reaction:  Unknown   . Lodine [Etodolac]  Other (See Comments)    Reaction:   GI upset   . Methotrexate Derivatives Other (See Comments)    Reaction:  GI upset   . Gold Rash  . Zantac [Ranitidine Hcl] Rash   Prior to Admission medications   Medication Sig Start Date End Date Taking? Authorizing Provider  acetaminophen (TYLENOL) 500 MG tablet Take 500-1,000 mg by mouth every 6 (six) hours as needed for mild pain, moderate pain or fever.   Yes [provider]  Adalimumab 40 MG/0.8ML PSKT Inject 40 mg into the skin every 30 (thirty) days.    Yes [provider]  albuterol (PROVENTIL HFA;VENTOLIN HFA) 108 (90 BASE) MCG/ACT inhaler Inhale 2 puffs into the lungs every 6 (six) hours as needed for wheezing or shortness of breath.   Yes [provider]  amitriptyline (ELAVIL) 10 MG tablet Take 20 mg by mouth at bedtime.   Yes [provider]  ANORO ELLIPTA 62.5-25 MCG/INH AEPB Inhale 1 puff into the lungs daily. 05/29/19  Yes [provider]  aspirin EC 81 MG tablet Take 81 mg by mouth daily.   Yes [provider]  atorvastatin (LIPITOR) 80 MG tablet Take 80 mg by mouth daily.   Yes [provider]  Calcium-Phosphorus-Vitamin D (CITRACAL +D3 PO) Take 1 tablet by mouth daily.   Yes [provider]  Cobalamin Combinations (VITAMIN B12-FOLIC ACID) 128-786 MCG TABS Take 1 tablet by mouth as directed.   Yes [provider]  dextromethorphan-guaiFENesin (MUCINEX DM) 30-600 MG 12hr tablet Take 1 tablet by mouth 2 (two) times daily as needed for cough. 03/11/20  Yes Nicole Kindred A, DO  ENTRESTO 24-26 MG TAKE ONE TABLET BY MOUTH TWICE DAILY Patient taking differently: Take 1 tablet by mouth 2 (two) times daily. 02/29/20  Yes Catheline Hixon, Otila Kluver A, FNP  esomeprazole (NEXIUM) 40 MG capsule Take 40 mg by mouth daily. 05/29/19  Yes [provider]  folic acid (FOLVITE) 767 MCG tablet Take 400 mcg by mouth daily.   Yes [provider]  furosemide (LASIX) 40 MG tablet  Take 1 tablet (40 mg total) by mouth 2 (two) times daily. 11/26/19  Yes Wieting, Richard, MD  furosemide (LASIX) 40 MG tablet Take 1 tablet (40 mg total) by mouth daily. In addition to current dose 05/14/20 08/12/20 Yes Dontel Harshberger, Otila Kluver A, FNP  leflunomide (ARAVA) 10 MG tablet Take 10 mg by mouth daily. 03/29/19  Yes [provider]  mesalamine (PENTASA) 500 MG CR capsule Take 1,000 mg by mouth 2 (two) times daily.   Yes [provider]  metoprolol succinate (TOPROL-XL) 50 MG 24 hr tablet Take 50 mg by mouth daily.    Yes [provider]  Multiple Vitamin (MULTIVITAMIN WITH MINERALS) TABS tablet Take 1 tablet by mouth daily.   Yes [provider]  potassium chloride (KLOR-CON) 10 MEQ tablet Take 20 mEq by mouth daily.  Yes [provider]  predniSONE (DELTASONE) 5 MG tablet Take 3 tablets (15 mg total) by mouth daily with breakfast. 03/11/20  Yes Nicole Kindred A, DO  thiamine (VITAMIN B-1) 100 MG tablet Take 100 mg by mouth daily.   Yes [provider]  vitamin E 400 UNIT capsule Take 400 Units by mouth daily.   Yes [provider]    Review of Systems  Constitutional: Positive for fatigue ("all the time"). Negative for appetite change and fever.  HENT: Negative for congestion, postnasal drip and sore throat.   Eyes: Negative.   Respiratory: Positive for cough and shortness of breath (worsening). Negative for wheezing.   Cardiovascular: Positive for leg swelling. Negative for chest pain and palpitations.  Gastrointestinal: Negative for abdominal distention and abdominal pain.  Endocrine: Negative.   Genitourinary: Negative.   Musculoskeletal: Positive for arthralgias (left wrist). Negative for back pain.  Skin: Negative.   Allergic/Immunologic: Negative.   Neurological: Positive for light-headedness. Negative for dizziness.  Hematological: Negative for adenopathy. Bruises/bleeds easily.  Psychiatric/Behavioral: Positive for dysphoric  mood and sleep disturbance (intermittently; sleeping on 1 pillow). The patient is not nervous/anxious.    Vitals:   05/23/20 1030 05/23/20 1052  BP: (!) 146/69   Pulse: 89 82  Resp: (!) 22   SpO2: (!) 78% 98%  Weight: 176 lb 2 oz (79.9 kg)   Height: 5' 6"  (1.676 m)    Wt Readings from Last 3 Encounters:  05/23/20 176 lb 2 oz (79.9 kg)  05/14/20 172 lb 4 oz (78.1 kg)  03/19/20 162 lb 8 oz (73.7 kg)   Lab Results  Component Value Date   CREATININE 0.82 05/23/2020   CREATININE 1.06 (H) 03/11/2020   CREATININE 0.72 03/10/2020    Physical Exam Vitals and nursing note reviewed.  Constitutional:      Appearance: She is well-developed. She is ill-appearing.  HENT:     Head: Normocephalic and atraumatic.  Neck:     Vascular: No JVD.  Cardiovascular:     Rate and Rhythm: Normal rate and regular rhythm.  Pulmonary:     Effort: Pulmonary effort is normal. No respiratory distress.     Breath sounds: No wheezing or rales.     Comments: Diminished in RLL Abdominal:     Palpations: Abdomen is soft.     Tenderness: There is no abdominal tenderness.  Musculoskeletal:        General: No tenderness.     Cervical back: Neck supple.     Right lower leg: No tenderness. Edema (2+ pitting) present.     Left lower leg: No tenderness. Edema (2+ pitting) present.  Skin:    General: Skin is warm and dry.  Neurological:     General: No focal deficit present.     Mental Status: She is alert and oriented to person, place, and time.  Psychiatric:        Mood and Affect: Mood normal.        Behavior: Behavior normal.    Assessment & Plan:  1: Acute on Chronic heart failure with reduced ejection fraction- - NYHA class III - minimally fluid overloaded today with weight gain, worsening edema and worsening SOB - weighing daily; reminded to call for an overnight weight gain of >2 pounds or a weekly weight gain of >5 pounds - weight up 4 pounds from last visit here 9 days ago - referral made to  pulmonary rehab last week - will stop her furosemide and begin torsemide 73m  BID; she is to continue her furosemide until her pharmacy brings new bubble pack with the torsemide in it - will send for 2m IV lasix/ 460m PO potassium today - BMP/ BNP to be drawn today - using Mrs DaDeliah Bostoneasoning on her foods and tries to look at food labels - saw cardiology (Paras[facility]) 03/04/20 - BNP 03/08/20 was 501.8 - reports receiving both COVID vaccines & the flu vaccine for this season  2: HTN- - BP looks good today today - saw PCP (JEdwina Barth1/10/22; f/u appointment was scheduled with him for tomorrow so she can be reassessed before the weekend - BMP 03/18/20 reviewed and showed sodium 143, potassium 4.4, creatinine 1.0 and GFR 53  3: COPD- - saw pulmonology (FRaul Del1/31/22 - wearing oxygen at 1L QHS and PRN during the day - instructed her to wear her oxygen all the time right now  - will send for CXR today  4: Lymphedema- - stage 2 - home PT finished - trying to elevate her legs more often - wearing zippered compression socks but swelling has worsened - changing diuretic per above - consider compression boots if this persists   Patient did not bring her medications nor a list. Each medication was verbally reviewed with the patient and she was encouraged to bring the bottles to every visit to confirm accuracy of list.  Since she is seeing PCP tomorrow, will have her return her in 1 month. Secure chat sent to her cardiologist, pulmonologist and PCP. Have faxed lab results from today to PCP and will fax my note as well.

## 2020-05-24 ENCOUNTER — Ambulatory Visit: Payer: Medicare Other

## 2020-06-10 ENCOUNTER — Other Ambulatory Visit (HOSPITAL_COMMUNITY): Payer: Self-pay | Admitting: Internal Medicine

## 2020-06-10 ENCOUNTER — Other Ambulatory Visit: Payer: Self-pay | Admitting: Internal Medicine

## 2020-06-10 DIAGNOSIS — R1011 Right upper quadrant pain: Secondary | ICD-10-CM

## 2020-06-13 ENCOUNTER — Ambulatory Visit
Admission: RE | Admit: 2020-06-13 | Discharge: 2020-06-13 | Disposition: A | Discharge status: Home / Self Care | Payer: Medicare Other | Source: Ambulatory Visit | Attending: Internal Medicine | Admitting: Internal Medicine

## 2020-06-13 ENCOUNTER — Other Ambulatory Visit: Payer: Self-pay

## 2020-06-13 DIAGNOSIS — R1011 Right upper quadrant pain: Secondary | ICD-10-CM | POA: Diagnosis present

## 2020-06-15 ENCOUNTER — Other Ambulatory Visit
Admission: RE | Admit: 2020-06-15 | Discharge: 2020-06-15 | Disposition: A | Discharge status: Home / Self Care | Payer: Medicare Other | Source: Ambulatory Visit | Attending: Gastroenterology | Admitting: Gastroenterology

## 2020-06-15 DIAGNOSIS — K5000 Crohn's disease of small intestine without complications: Secondary | ICD-10-CM | POA: Diagnosis present

## 2020-06-15 DIAGNOSIS — R197 Diarrhea, unspecified: Secondary | ICD-10-CM | POA: Diagnosis present

## 2020-06-15 LAB — C DIFFICILE QUICK SCREEN W PCR REFLEX
C Diff antigen: NEGATIVE
C Diff interpretation: NOT DETECTED
C Diff toxin: NEGATIVE

## 2020-06-20 ENCOUNTER — Other Ambulatory Visit
Admission: RE | Admit: 2020-06-20 | Discharge: 2020-06-20 | Disposition: A | Discharge status: Home / Self Care | Payer: Medicare Other | Source: Ambulatory Visit | Attending: Gastroenterology | Admitting: Gastroenterology

## 2020-06-20 DIAGNOSIS — K5000 Crohn's disease of small intestine without complications: Secondary | ICD-10-CM | POA: Insufficient documentation

## 2020-06-20 DIAGNOSIS — R197 Diarrhea, unspecified: Secondary | ICD-10-CM | POA: Diagnosis present

## 2020-06-20 LAB — GASTROINTESTINAL PANEL BY PCR, STOOL (REPLACES STOOL CULTURE)

## 2020-06-23 LAB — CALPROTECTIN, FECAL: Calprotectin, Fecal: 52 ug/g (ref 0–120)

## 2020-06-24 ENCOUNTER — Ambulatory Visit: Payer: Medicare Other | Admitting: Family

## 2020-07-04 ENCOUNTER — Emergency Department: Payer: Medicare Other

## 2020-07-04 ENCOUNTER — Other Ambulatory Visit: Payer: Self-pay | Admitting: Gastroenterology

## 2020-07-04 ENCOUNTER — Other Ambulatory Visit: Payer: Self-pay

## 2020-07-04 ENCOUNTER — Inpatient Hospital Stay
Admission: EM | Admit: 2020-07-04 | Discharge: 2020-07-09 | DRG: 291 | Disposition: A | Discharge status: Skilled Nursing Facility | Payer: Medicare Other | Attending: Internal Medicine | Admitting: Internal Medicine

## 2020-07-04 ENCOUNTER — Other Ambulatory Visit (HOSPITAL_COMMUNITY): Payer: Self-pay | Admitting: Gastroenterology

## 2020-07-04 DIAGNOSIS — I130 Hypertensive heart and chronic kidney disease with heart failure and stage 1 through stage 4 chronic kidney disease, or unspecified chronic kidney disease: Principal | ICD-10-CM | POA: Diagnosis present

## 2020-07-04 DIAGNOSIS — N189 Chronic kidney disease, unspecified: Secondary | ICD-10-CM | POA: Diagnosis not present

## 2020-07-04 DIAGNOSIS — M8580 Other specified disorders of bone density and structure, unspecified site: Secondary | ICD-10-CM | POA: Diagnosis present

## 2020-07-04 DIAGNOSIS — I5023 Acute on chronic systolic (congestive) heart failure: Secondary | ICD-10-CM | POA: Diagnosis present

## 2020-07-04 DIAGNOSIS — Z87891 Personal history of nicotine dependence: Secondary | ICD-10-CM

## 2020-07-04 DIAGNOSIS — R0602 Shortness of breath: Secondary | ICD-10-CM

## 2020-07-04 DIAGNOSIS — J441 Chronic obstructive pulmonary disease with (acute) exacerbation: Secondary | ICD-10-CM | POA: Diagnosis present

## 2020-07-04 DIAGNOSIS — E860 Dehydration: Secondary | ICD-10-CM | POA: Diagnosis present

## 2020-07-04 DIAGNOSIS — Z91048 Other nonmedicinal substance allergy status: Secondary | ICD-10-CM

## 2020-07-04 DIAGNOSIS — J9611 Chronic respiratory failure with hypoxia: Secondary | ICD-10-CM | POA: Diagnosis not present

## 2020-07-04 DIAGNOSIS — E1122 Type 2 diabetes mellitus with diabetic chronic kidney disease: Secondary | ICD-10-CM | POA: Diagnosis present

## 2020-07-04 DIAGNOSIS — M069 Rheumatoid arthritis, unspecified: Secondary | ICD-10-CM | POA: Diagnosis present

## 2020-07-04 DIAGNOSIS — I5022 Chronic systolic (congestive) heart failure: Secondary | ICD-10-CM | POA: Diagnosis present

## 2020-07-04 DIAGNOSIS — F32A Depression, unspecified: Secondary | ICD-10-CM | POA: Diagnosis present

## 2020-07-04 DIAGNOSIS — N182 Chronic kidney disease, stage 2 (mild): Secondary | ICD-10-CM | POA: Diagnosis present

## 2020-07-04 DIAGNOSIS — Z9981 Dependence on supplemental oxygen: Secondary | ICD-10-CM

## 2020-07-04 DIAGNOSIS — I2510 Atherosclerotic heart disease of native coronary artery without angina pectoris: Secondary | ICD-10-CM | POA: Diagnosis present

## 2020-07-04 DIAGNOSIS — J9621 Acute and chronic respiratory failure with hypoxia: Secondary | ICD-10-CM | POA: Diagnosis present

## 2020-07-04 DIAGNOSIS — R7401 Elevation of levels of liver transaminase levels: Secondary | ICD-10-CM | POA: Diagnosis present

## 2020-07-04 DIAGNOSIS — Z79899 Other long term (current) drug therapy: Secondary | ICD-10-CM

## 2020-07-04 DIAGNOSIS — R531 Weakness: Secondary | ICD-10-CM | POA: Diagnosis not present

## 2020-07-04 DIAGNOSIS — Z8249 Family history of ischemic heart disease and other diseases of the circulatory system: Secondary | ICD-10-CM

## 2020-07-04 DIAGNOSIS — I471 Supraventricular tachycardia, unspecified: Secondary | ICD-10-CM

## 2020-07-04 DIAGNOSIS — R42 Dizziness and giddiness: Secondary | ICD-10-CM

## 2020-07-04 DIAGNOSIS — J449 Chronic obstructive pulmonary disease, unspecified: Secondary | ICD-10-CM | POA: Diagnosis not present

## 2020-07-04 DIAGNOSIS — E785 Hyperlipidemia, unspecified: Secondary | ICD-10-CM | POA: Diagnosis present

## 2020-07-04 DIAGNOSIS — I248 Other forms of acute ischemic heart disease: Secondary | ICD-10-CM | POA: Diagnosis present

## 2020-07-04 DIAGNOSIS — M109 Gout, unspecified: Secondary | ICD-10-CM | POA: Diagnosis present

## 2020-07-04 DIAGNOSIS — F418 Other specified anxiety disorders: Secondary | ICD-10-CM | POA: Diagnosis present

## 2020-07-04 DIAGNOSIS — Z880 Allergy status to penicillin: Secondary | ICD-10-CM

## 2020-07-04 DIAGNOSIS — R52 Pain, unspecified: Secondary | ICD-10-CM

## 2020-07-04 DIAGNOSIS — R7989 Other specified abnormal findings of blood chemistry: Secondary | ICD-10-CM | POA: Diagnosis present

## 2020-07-04 DIAGNOSIS — I252 Old myocardial infarction: Secondary | ICD-10-CM

## 2020-07-04 DIAGNOSIS — F419 Anxiety disorder, unspecified: Secondary | ICD-10-CM | POA: Diagnosis present

## 2020-07-04 DIAGNOSIS — M75102 Unspecified rotator cuff tear or rupture of left shoulder, not specified as traumatic: Secondary | ICD-10-CM | POA: Diagnosis present

## 2020-07-04 DIAGNOSIS — E1169 Type 2 diabetes mellitus with other specified complication: Secondary | ICD-10-CM | POA: Diagnosis present

## 2020-07-04 DIAGNOSIS — Z7982 Long term (current) use of aspirin: Secondary | ICD-10-CM

## 2020-07-04 DIAGNOSIS — R778 Other specified abnormalities of plasma proteins: Secondary | ICD-10-CM | POA: Diagnosis present

## 2020-07-04 DIAGNOSIS — I509 Heart failure, unspecified: Secondary | ICD-10-CM

## 2020-07-04 DIAGNOSIS — N179 Acute kidney failure, unspecified: Secondary | ICD-10-CM

## 2020-07-04 DIAGNOSIS — Z20822 Contact with and (suspected) exposure to covid-19: Secondary | ICD-10-CM | POA: Diagnosis present

## 2020-07-04 DIAGNOSIS — M25512 Pain in left shoulder: Secondary | ICD-10-CM

## 2020-07-04 DIAGNOSIS — E875 Hyperkalemia: Secondary | ICD-10-CM | POA: Diagnosis not present

## 2020-07-04 DIAGNOSIS — Z7952 Long term (current) use of systemic steroids: Secondary | ICD-10-CM

## 2020-07-04 DIAGNOSIS — K50919 Crohn's disease, unspecified, with unspecified complications: Secondary | ICD-10-CM | POA: Diagnosis not present

## 2020-07-04 DIAGNOSIS — Z8701 Personal history of pneumonia (recurrent): Secondary | ICD-10-CM

## 2020-07-04 DIAGNOSIS — Z955 Presence of coronary angioplasty implant and graft: Secondary | ICD-10-CM

## 2020-07-04 DIAGNOSIS — K5090 Crohn's disease, unspecified, without complications: Secondary | ICD-10-CM | POA: Diagnosis present

## 2020-07-04 DIAGNOSIS — Z7951 Long term (current) use of inhaled steroids: Secondary | ICD-10-CM

## 2020-07-04 DIAGNOSIS — E876 Hypokalemia: Secondary | ICD-10-CM | POA: Diagnosis present

## 2020-07-04 DIAGNOSIS — Z92241 Personal history of systemic steroid therapy: Secondary | ICD-10-CM

## 2020-07-04 DIAGNOSIS — K5000 Crohn's disease of small intestine without complications: Secondary | ICD-10-CM

## 2020-07-04 DIAGNOSIS — Z8711 Personal history of peptic ulcer disease: Secondary | ICD-10-CM

## 2020-07-04 DIAGNOSIS — R609 Edema, unspecified: Secondary | ICD-10-CM

## 2020-07-04 LAB — BASIC METABOLIC PANEL
Anion gap: 12 (ref 5–15)
BUN: 41 mg/dL — ABNORMAL HIGH (ref 8–23)
CO2: 21 mmol/L — ABNORMAL LOW (ref 22–32)
Calcium: 8.1 mg/dL — ABNORMAL LOW (ref 8.9–10.3)
Chloride: 108 mmol/L (ref 98–111)
Creatinine, Ser: 1.32 mg/dL — ABNORMAL HIGH (ref 0.44–1.00)
GFR, Estimated: 40 mL/min — ABNORMAL LOW (ref >60)
Glucose, Bld: 166 mg/dL — ABNORMAL HIGH (ref 70–99)
Potassium: 3.5 mmol/L (ref 3.5–5.1)
Sodium: 141 mmol/L (ref 135–145)

## 2020-07-04 LAB — CBC
HCT: 38.8 % (ref 36.0–46.0)
Hemoglobin: 12.5 g/dL (ref 12.0–15.0)
MCH: 29.1 pg (ref 26.0–34.0)
MCHC: 32.2 g/dL (ref 30.0–36.0)
MCV: 90.4 fL (ref 80.0–100.0)
Platelets: 200 10*3/uL (ref 150–400)
RBC: 4.29 MIL/uL (ref 3.87–5.11)
RDW: 14.8 % (ref 11.5–15.5)
WBC: 6.6 10*3/uL (ref 4.0–10.5)
nRBC: 0 % (ref 0.0–0.2)

## 2020-07-04 LAB — URINALYSIS, COMPLETE (UACMP) WITH MICROSCOPIC
Bacteria, UA: NONE SEEN
Bilirubin Urine: NEGATIVE
Glucose, UA: NEGATIVE mg/dL
Hgb urine dipstick: NEGATIVE
Ketones, ur: NEGATIVE mg/dL
Nitrite: NEGATIVE
Protein, ur: NEGATIVE mg/dL
Specific Gravity, Urine: 1.009 (ref 1.005–1.030)
pH: 5 (ref 5.0–8.0)

## 2020-07-04 LAB — TROPONIN I (HIGH SENSITIVITY): Troponin I (High Sensitivity): 52 ng/L — ABNORMAL HIGH (ref <18)

## 2020-07-04 LAB — BRAIN NATRIURETIC PEPTIDE: B Natriuretic Peptide: >4500 pg/mL — ABNORMAL HIGH (ref 0.0–100.0)

## 2020-07-04 LAB — RESP PANEL BY RT-PCR (FLU A&B, COVID) ARPGX2
Influenza A by PCR: NEGATIVE
Influenza B by PCR: NEGATIVE
SARS Coronavirus 2 by RT PCR: NEGATIVE

## 2020-07-04 LAB — MAGNESIUM: Magnesium: 1.2 mg/dL — ABNORMAL LOW (ref 1.7–2.4)

## 2020-07-04 MED ORDER — INSULIN ASPART 100 UNIT/ML IJ SOLN
0.0000 [IU] | INTRAMUSCULAR | Status: DC
  Administered 2020-07-05: 2 [IU] via SUBCUTANEOUS
  Administered 2020-07-05 (×2): 3 [IU] via SUBCUTANEOUS
  Filled 2020-07-04 (×3): qty 1

## 2020-07-04 MED ORDER — LACTATED RINGERS IV BOLUS: 500.0000 mL | Freq: Once | INTRAVENOUS | Status: DC

## 2020-07-04 MED ORDER — FUROSEMIDE 10 MG/ML IJ SOLN
40.0000 mg | Freq: Two times a day (BID) | INTRAMUSCULAR | Status: DC
  Administered 2020-07-05 (×2): 40 mg via INTRAVENOUS
  Filled 2020-07-04 (×2): qty 4

## 2020-07-04 MED ORDER — METOPROLOL SUCCINATE ER 50 MG PO TB24
50.0000 mg | ORAL_TABLET | Freq: Every day | ORAL | Status: DC
  Administered 2020-07-05 – 2020-07-07 (×3): 50 mg via ORAL
  Filled 2020-07-04 (×3): qty 1

## 2020-07-04 MED ORDER — ONDANSETRON HCL 4 MG/2ML IJ SOLN: 4.0000 mg | Freq: Four times a day (QID) | INTRAMUSCULAR | Status: DC | PRN

## 2020-07-04 MED ORDER — SODIUM CHLORIDE 0.9% FLUSH: 3.0000 mL | INTRAVENOUS | Status: DC | PRN

## 2020-07-04 MED ORDER — MESALAMINE ER 250 MG PO CPCR
500.0000 mg | ORAL_CAPSULE | Freq: Two times a day (BID) | ORAL | Status: DC
  Administered 2020-07-05 – 2020-07-09 (×9): 500 mg via ORAL
  Filled 2020-07-04 (×11): qty 2

## 2020-07-04 MED ORDER — SODIUM CHLORIDE 0.9% FLUSH
3.0000 mL | Freq: Two times a day (BID) | INTRAVENOUS | Status: DC
  Administered 2020-07-05 – 2020-07-09 (×10): 3 mL via INTRAVENOUS

## 2020-07-04 MED ORDER — ACETAMINOPHEN 325 MG PO TABS: 650.0000 mg | ORAL_TABLET | ORAL | Status: DC | PRN

## 2020-07-04 MED ORDER — ALBUTEROL SULFATE (2.5 MG/3ML) 0.083% IN NEBU: 2.5000 mg | INHALATION_SOLUTION | Freq: Four times a day (QID) | RESPIRATORY_TRACT | Status: DC | PRN

## 2020-07-04 MED ORDER — LACTATED RINGERS IV BOLUS: 1000.0000 mL | Freq: Once | INTRAVENOUS | Status: DC

## 2020-07-04 MED ORDER — MAGNESIUM SULFATE 2 GM/50ML IV SOLN: 2.0000 g | Freq: Once | INTRAVENOUS | Status: DC

## 2020-07-04 MED ORDER — MAGNESIUM SULFATE 2 GM/50ML IV SOLN
2.0000 g | Freq: Once | INTRAVENOUS | Status: AC
  Administered 2020-07-05: 2 g via INTRAVENOUS
  Filled 2020-07-04: qty 50

## 2020-07-04 MED ORDER — ENOXAPARIN SODIUM 40 MG/0.4ML IJ SOSY
40.0000 mg | PREFILLED_SYRINGE | INTRAMUSCULAR | Status: DC
  Administered 2020-07-05 – 2020-07-09 (×5): 40 mg via SUBCUTANEOUS
  Filled 2020-07-04 (×5): qty 0.4

## 2020-07-04 MED ORDER — FUROSEMIDE 10 MG/ML IJ SOLN
80.0000 mg | Freq: Once | INTRAMUSCULAR | Status: AC
  Administered 2020-07-04: 80 mg via INTRAVENOUS
  Filled 2020-07-04: qty 8

## 2020-07-04 MED ORDER — AMITRIPTYLINE HCL 10 MG PO TABS
20.0000 mg | ORAL_TABLET | Freq: Every day | ORAL | Status: DC
  Administered 2020-07-05 – 2020-07-08 (×4): 20 mg via ORAL
  Filled 2020-07-04 (×6): qty 2

## 2020-07-04 MED ORDER — SODIUM CHLORIDE 0.9 % IV SOLN: 250.0000 mL | INTRAVENOUS | Status: DC | PRN

## 2020-07-04 MED ORDER — ASPIRIN 81 MG PO CHEW
324.0000 mg | CHEWABLE_TABLET | Freq: Once | ORAL | Status: AC
  Administered 2020-07-04: 324 mg via ORAL
  Filled 2020-07-04: qty 4

## 2020-07-04 MED ORDER — ALPRAZOLAM 0.25 MG PO TABS
0.2500 mg | ORAL_TABLET | Freq: Two times a day (BID) | ORAL | Status: DC | PRN
  Administered 2020-07-07: 0.25 mg via ORAL
  Filled 2020-07-04: qty 1

## 2020-07-04 NOTE — ED Notes (Signed)
Pt states she is on O2 3 L:[place] at home, pt placed on O2 3 Lnc.

## 2020-07-04 NOTE — ED Provider Notes (Addendum)
[facility] Emergency Department Provider Note  ____________________________________________   Event Date/Time   First MD Initiated Contact with Patient 07/04/20 2105     (approximate)  I have reviewed the triage vital signs and the nursing notes.   HISTORY  Chief Complaint Dizziness and Nausea   HPI Raven [name] is a 86 y.o. female with a past medical history of anemia, arthritis, asthma, CHF, COPD, depression, HTN, HDL, peptic ulcer disease and CAD who presents for assessment of some dizziness and nausea that started earlier today.  Patient states she has been feeling more short of breath over the last couple weeks as well.  She is not sure if she has gained any weight.  She endorses slight nonproductive cough.  No fevers, chest pain, [place] pain, vomiting, diarrhea, dysuria, rash or recent injuries or falls.  She states she is normally on 2 L of oxygen by her primary care doctor increased it to 3 today.  She states she has been taking her torsemide and other medicines as directed.  Denies any other acute concerns at this time.         Past Medical History:  Diagnosis Date  . Anemia   . Arthritis   . Asthma   . CHF (congestive heart failure) ([place])   . Complication of anesthesia   . COPD (chronic obstructive pulmonary disease) ([place])   . Crohn's disease ([place])   . Depression    after death of husband  . Hyperlipidemia   . Hypertension   . Myocardial infarction ([place])   . Osteopenia   . Peptic ulcer disease   . Pneumonia   . PONV (postoperative nausea and vomiting)     Patient Active Problem List   Diagnosis Date Noted  . AKI (acute kidney injury) ([place]) 07/04/2020  . Dizziness 07/04/2020  . Dyspnea 03/09/2020  . Nausea vomiting and diarrhea 03/08/2020  . Hyperkalemia   . Hypomagnesemia   . Pain in both lower extremities   . Generalized weakness   . Rheumatoid arthritis ([place])   . Acute on chronic systolic CHF (congestive heart failure)  ([place]) 11/22/2019  . Acute on chronic systolic (congestive) heart failure ([place]) 11/22/2019  . Chronic respiratory failure with hypoxia ([place]) 11/22/2019  . HLD (hyperlipidemia) 08/25/2019  . HTN (hypertension) 08/25/2019  . Depression with anxiety   . CAD (coronary artery disease)   . Elevated troponin   . Hypokalemia   . Crohn's disease ([place])   . Acute on chronic respiratory failure with hypoxia ([place])   . 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 SBO (small bowel obstruction) ([place]) 06/17/2019  . Crohn's disease of small intestine with intestinal obstruction ([place])   . COPD exacerbation ([place]) 10/20/2018  . COPD (chronic obstructive pulmonary disease) ([place]) 04/22/2016  . Acute bronchitis 04/17/2016  . Sepsis ([place]) 04/17/2016  . Peroneal tendonitis 07/18/2015  . Pneumonia 04/19/2015  . CAP (community acquired pneumonia) 11/02/2014    Past Surgical History:  Procedure Laterality Date  . ABDOMINAL SURGERY    . CARDIAC CATHETERIZATION     with stent  . COLONOSCOPY WITH PROPOFOL N/A 09/27/2014   Procedure: COLONOSCOPY WITH PROPOFOL;  Surgeon: [name], MD;  Location: [facility] ENDOSCOPY;  Service: Gastroenterology;  Laterality: N/A;  . COLONOSCOPY WITH PROPOFOL N/A 06/21/2019   Procedure: COLONOSCOPY WITH PROPOFOL;  Surgeon: Lin Landsman, MD;  Location: [facility] ENDOSCOPY;  Service: Gastroenterology;  Laterality: N/A;  . EYE SURGERY     bilateral cataract surgeries  . SHOULDER SURGERY  Right    x 2   . SMALL INTESTINE SURGERY      Prior to Admission medications   Medication Sig Start Date End Date Taking? Authorizing Provider  amitriptyline (ELAVIL) 10 MG tablet Take 20 mg by mouth at bedtime.   Yes [provider]  ANORO ELLIPTA 62.5-25 MCG/INH AEPB Inhale 1 puff into the lungs daily. 05/29/19  Yes [provider]  atorvastatin (LIPITOR) 80 MG tablet Take 80 mg by mouth daily.   Yes [provider]  ENTRESTO 24-26 MG TAKE ONE TABLET BY MOUTH TWICE DAILY Patient  taking differently: Take 1 tablet by mouth 2 (two) times daily. 02/29/20  Yes Hackney, Otila Kluver A, FNP  esomeprazole (NEXIUM) 40 MG capsule Take 40 mg by mouth daily. 05/29/19  Yes [provider]  leflunomide (ARAVA) 10 MG tablet Take 10 mg by mouth daily. 03/29/19  Yes [provider]  metoprolol succinate (TOPROL-XL) 50 MG 24 hr tablet Take 50 mg by mouth daily.    Yes [provider]  acetaminophen (TYLENOL) 500 MG tablet Take 500-1,000 mg by mouth every 6 (six) hours as needed for mild pain, moderate pain or fever.    [provider]  Adalimumab 40 MG/0.8ML PSKT Inject 40 mg into the skin every 30 (thirty) days.     [provider]  albuterol (PROVENTIL HFA;VENTOLIN HFA) 108 (90 BASE) MCG/ACT inhaler Inhale 2 puffs into the lungs every 6 (six) hours as needed for wheezing or shortness of breath.    [provider]  aspirin EC 81 MG tablet Take 81 mg by mouth daily.    [provider]  Calcium-Phosphorus-Vitamin D (CITRACAL +D3 PO) Take 1 tablet by mouth daily.    [provider]  Cobalamin Combinations (VITAMIN B12-FOLIC ACID) 147-829 MCG TABS Take 1 tablet by mouth as directed.    [provider]  dextromethorphan-guaiFENesin (MUCINEX DM) 30-600 MG 12hr tablet Take 1 tablet by mouth 2 (two) times daily as needed for cough. 03/11/20   Ezekiel Slocumb, DO  folic acid (FOLVITE) 562 MCG tablet Take 400 mcg by mouth daily.    [provider]  mesalamine (PENTASA) 500 MG CR capsule Take 1,000 mg by mouth 2 (two) times daily.    [provider]  Multiple Vitamin (MULTIVITAMIN WITH MINERALS) TABS tablet Take 1 tablet by mouth daily.    [provider]  potassium chloride (KLOR-CON) 10 MEQ tablet Take 20 mEq by mouth daily.    [provider]  predniSONE (DELTASONE) 5 MG tablet Take 3 tablets (15 mg total) by mouth daily with breakfast. 03/11/20   Nicole Kindred A, DO  thiamine (VITAMIN B-1) 100  MG tablet Take 100 mg by mouth daily.    [provider]  torsemide (DEMADEX) 20 MG tablet Take 2 tablets (40 mg total) by mouth 2 (two) times daily. 05/23/20 08/21/20  Alisa Graff, FNP  vitamin E 400 UNIT capsule Take 400 Units by mouth daily.    [provider]    Allergies Penicillins, Augmentin [amoxicillin-pot clavulanate], Indocin [indomethacin], Lodine [etodolac], Methotrexate derivatives, Gold, and Zantac [ranitidine hcl]  Family History  Problem Relation Age of Onset  . Hypertension Other     Social History Social History   Tobacco Use  . Smoking status: Former Smoker    Quit date: 1974    Years since quitting: 48.3  . Smokeless tobacco: Never Used  Vaping Use  . Vaping Use: Never used  Substance Use Topics  .  Alcohol use: Yes    Comment: occassional  . Drug use: No    Review of Systems  Review of Systems  Constitutional: Positive for malaise/fatigue. Negative for chills and fever.  HENT: Negative for sore throat.   Eyes: Negative for pain.  Respiratory: Positive for shortness of breath. Negative for cough and stridor.   Cardiovascular: Negative for chest pain.  Gastrointestinal: Negative for vomiting.  Genitourinary: Negative for dysuria.  Musculoskeletal: Negative for myalgias.  Skin: Negative for rash.  Neurological: Positive for dizziness and weakness. Negative for seizures, loss of consciousness and headaches.  Psychiatric/Behavioral: Negative for suicidal ideas.  All other systems reviewed and are negative.     ____________________________________________   PHYSICAL EXAM:  VITAL SIGNS: ED Triage Vitals  Enc Vitals Group     BP 07/04/20 1904 (!) 149/87     Pulse Rate 07/04/20 1904 78     Resp 07/04/20 1904 (!) 22     Temp 07/04/20 1904 97.7 F (36.5 C)     Temp Source 07/04/20 1904 Oral     SpO2 07/04/20 1904 93 %     Weight 07/04/20 1905 175 lb (79.4 kg)     Height 07/04/20 1905 5' 6"  (1.676 m)     Head Circumference  --      Peak Flow --      Pain Score 07/04/20 1905 0     Pain Loc --      Pain Edu? --      Excl. in [place]? --    Vitals:   07/04/20 2115 07/04/20 2230  BP: (!) 154/122 (!) 160/87  Pulse: 60 97  Resp: 20 20  Temp:    SpO2: 94% 95%   Physical Exam Vitals and nursing note reviewed.  Constitutional:      General: She is not in acute distress.    Appearance: She is well-developed.  HENT:     Head: Normocephalic and atraumatic.     Right Ear: External ear normal.     Left Ear: External ear normal.     Nose: Nose normal.  Eyes:     Conjunctiva/sclera: Conjunctivae normal.  Cardiovascular:     Rate and Rhythm: Normal rate and regular rhythm.     Heart sounds: No murmur heard.   Pulmonary:     Effort: Pulmonary effort is normal. No respiratory distress.     Breath sounds: Examination of the right-middle field reveals rales. Examination of the left-middle field reveals rales. Examination of the right-lower field reveals rales. Examination of the left-lower field reveals rales. Rales present.  Abdominal:     Palpations: Abdomen is soft.     Tenderness: There is no abdominal tenderness.  Musculoskeletal:     Cervical back: Neck supple.     Right lower leg: Edema present.     Left lower leg: Edema present.  Skin:    General: Skin is warm and dry.     Capillary Refill: Capillary refill takes less than 2 seconds.  Neurological:     Mental Status: She is alert and oriented to person, place, and time.  Psychiatric:        Mood and Affect: Mood normal.     No apparent drift.  No finger dysmetria.  Patient has full and symmetric strength on her bile upper and lower extremities.  Sensation intact light touch of all extremities.  2+ pedal radial pulses.  On standing patient is very unsteady on her feet and requires assistance to ambulate in place. ____________________________________________  LABS (all labs ordered are listed, but only abnormal results are displayed)  Labs Reviewed   BASIC METABOLIC PANEL - Abnormal; Notable for the following components:      Result Value   CO2 21 (*)    Glucose, Bld 166 (*)    BUN 41 (*)    Creatinine, Ser 1.32 (*)    Calcium 8.1 (*)    GFR, Estimated 40 (*)    All other components within normal limits  URINALYSIS, COMPLETE (UACMP) WITH MICROSCOPIC - Abnormal; Notable for the following components:   Color, Urine YELLOW (*)    APPearance CLEAR (*)    Leukocytes,Ua LARGE (*)    All other components within normal limits  BRAIN NATRIURETIC PEPTIDE - Abnormal; Notable for the following components:   B Natriuretic Peptide >4,500.0 (*)    All other components within normal limits  MAGNESIUM - Abnormal; Notable for the following components:   Magnesium 1.2 (*)    All other components within normal limits  TROPONIN I (HIGH SENSITIVITY) - Abnormal; Notable for the following components:   Troponin I (High Sensitivity) 52 (*)    All other components within normal limits  RESP PANEL BY RT-PCR (FLU A&B, COVID) [id number]  URINE CULTURE  CBC  HEMOGLOBIN H9X  BASIC METABOLIC PANEL  CBC  CREATININE, SERUM  MAGNESIUM  CBG MONITORING, ED   ____________________________________________  EKG  Sinus rhythm with a ventricular rate of 80, left anterior fascicle block, unremarkable intervals and some nonspecific changes in lead II lead III and aVF.  No other clear evidence of acute ischemia.  ____________________________________________  RADIOLOGY  ED MD interpretation: CT head is unremarkable for any acute intracranial abnormality.  Chest x-ray shows bilateral pulmonary edema without pneumothorax clear for consolidation large effusion or other clear acute intrathoracic process.  Official radiology report(s): DG Chest 2 View  Result Date: 07/04/2020 CLINICAL DATA:  Dizziness, nausea, tachypnea starting today. EXAM: CHEST - 2 VIEW COMPARISON:  05/23/2020 FINDINGS: Shallow inspiration with elevation of the right hemidiaphragm. Mild cardiac  enlargement. Diffuse interstitial pattern to the lungs could represent fibrosis, edema, or pneumonia. No pleural effusions. No pneumothorax. Mediastinal contours appear intact. Calcification of the aorta. Degenerative changes in the spine and shoulders. IMPRESSION: Diffuse interstitial pattern to the lungs could represent fibrosis, edema, or pneumonia. Mild cardiac enlargement. Electronically Signed   By: Lucienne Capers M.D.   On: 07/04/2020 21:37   CT Head Wo Contrast  Result Date: 07/04/2020 CLINICAL DATA:  Headache, dizziness, and nausea starting today. EXAM: CT HEAD WITHOUT CONTRAST TECHNIQUE: Contiguous axial images were obtained from the base of the skull through the vertex without intravenous contrast. COMPARISON:  09/16/2019 FINDINGS: Brain: No evidence of acute infarction, hemorrhage, hydrocephalus, extra-axial collection or mass lesion/mass effect. Mild diffuse cerebral atrophy. Patchy low-attenuation changes in the deep white matter consistent with small vessel ischemia. Vascular: Moderate intracranial arterial vascular calcifications. Skull: Calvarium appears intact. Sinuses/Orbits: Paranasal sinuses and mastoid air cells are clear. Other: None. IMPRESSION: 1. No acute intracranial abnormality. 2. Chronic atrophy and small vessel ischemia. Electronically Signed   By: Lucienne Capers M.D.   On: 07/04/2020 21:46    ____________________________________________   PROCEDURES  Procedure(s) performed (including Critical Care):  .1-3 Lead EKG Interpretation Performed by: Lucrezia Starch, MD Authorized by: Lucrezia Starch, MD     Interpretation: normal     ECG rate assessment: normal     Rhythm: sinus rhythm     Ectopy: none     Conduction: normal  ____________________________________________   INITIAL IMPRESSION / ASSESSMENT AND PLAN / ED COURSE      Patient presents with above to history exam for some nausea and dizziness general weakness and worsening shortness of  breath of last couple weeks.  She states that her PCP increased her oxygen from 2 L to 3 L today.  States she has been taking her torsemide but not sure if she is a little fluid up.  She is not keeping careful track of her weights.  On arrival she is hypertensive but otherwise stable vital signs on 3 L.  She appears quite winded on trial of ambulation is able to ambulate without significant assistance.  She has a nonfocal neuro exam and CT head is unremarkable and Evalose patient for CVA.  UA has some LES but otherwise she denies any urinary symptoms and there is no other evidence of infection to suggest cystitis as etiology for patient's with generalized weakness.  Her BMP shows a glucose of 166 but no other significant derangements.  CBC shows no leukocytosis or acute anemia to explain her generalized weakness.  Troponin is elevated at 52 but down from 129 on his last check 3 months ago and suspect some demand ischemia.  Suspect demand is from heart failure exacerbation and she has evidence of edema on exam or chest x-ray and her BNP is greater [id number].  Her magnesium is also low at 1.2.  This was repleted in addition to giving patient Lasix.  Given overall weakness dizziness and increased oxygen requirement today from PCP will admit to medicine service for diuresis and further evaluation and management.      ____________________________________________   FINAL CLINICAL IMPRESSION(S) / ED DIAGNOSES  Final diagnoses:  Acute heart failure, unspecified heart failure type ([place])  Hypomagnesemia  Acute on chronic respiratory failure with hypoxia ([facility])  Troponin I above reference range    Medications  furosemide (LASIX) injection 80 mg (has no administration in time range)  insulin aspart (novoLOG) injection 0-15 Units (has no administration in time range)  aspirin chewable tablet 324 mg (has no administration in time range)  magnesium sulfate IVPB 2 g 50 mL (has no administration in time range)   metoprolol succinate (TOPROL-XL) 24 hr tablet 50 mg (has no administration in time range)  mesalamine (PENTASA) CR capsule 500 mg (has no administration in time range)  albuterol (VENTOLIN HFA) 108 (90 Base) MCG/ACT inhaler 2 puff (has no administration in time range)  amitriptyline (ELAVIL) tablet 20 mg (has no administration in time range)  sodium chloride flush (NS) 0.9 % injection 3 mL (has no administration in time range)  sodium chloride flush (NS) 0.9 % injection 3 mL (has no administration in time range)  0.9 %  sodium chloride infusion (has no administration in time range)  acetaminophen (TYLENOL) tablet 650 mg (has no administration in time range)  ondansetron (ZOFRAN) injection 4 mg (has no administration in time range)  enoxaparin (LOVENOX) injection 40 mg (has no administration in time range)  furosemide (LASIX) injection 40 mg (has no administration in time range)  magnesium sulfate IVPB 2 g 50 mL (has no administration in time range)  ALPRAZolam (XANAX) tablet 0.25 mg (has no administration in time range)     ED Discharge Orders    None       Note:  This document was prepared using Dragon voice recognition software and may include unintentional dictation errors.   Lucrezia Starch, MD 07/04/20 2329    Lucrezia Starch,  MD 07/04/20 2354

## 2020-07-04 NOTE — H&P (Signed)
History and Physical    EVYNN [name] TIW:[id number] DOB: 10-22-1934 DOA: 07/04/2020  PCP: Baxter Hire, MD   Patient coming from: Home  I have personally briefly reviewed patient's old medical records in [place]  Chief Complaint: Dizziness  HPI: Raven [name] is a 86 y.o. female with medical history significant for CAD, systolic CHF, last EF 45 to 50% on 03/09/2020, steroid-dependent COPD on chronic O2 at 1 to 2 L, Crohn's disease and rheumatoid arthritis, last hospitalized for CHF exacerbation in January 2022 who presents to the emergency room with nausea, dizziness, which appears to be mostly on standing.  She endorses mild shortness of breath.  Denies orthopnea.  Does have increased lower extremity edema beyond baseline.  Went to see her PCP earlier in the day who increased her oxygen to 3 L.  She has a mild nonproductive cough but denies fevers or chills and denies chest pain.  Denies nausea vomiting or change in bowel habits.  Denies abdominal pain or dysuria. ED course: On arrival, BP 149/87, pulse 78, respirations 22, afebrile with O2 sat 93% on 3 L.  Blood work significant for creatinine of 1.32, above her baseline of 0.82, BNP over 4500, up from the 500s in January 2022.  Troponin of 52 and magnesium of 1.2.  Blood work for the most part otherwise unremarkable EKG personally viewed and interpreted: Sinus rhythm at 80 with nonspecific ST-T wave changes Imaging: CT head nonacute Chest x-ray: Diffuse interstitial pattern to the lungs could represent fibrosis, edema or pneumonia  Patient was treated with IV Lasix.  Hospitalist consulted for admission.  Review of Systems: As per HPI otherwise all other systems on review of systems negative.    Past Medical History:  Diagnosis Date  . Anemia   . Arthritis   . Asthma   . CHF (congestive heart failure) ([place])   . Complication of anesthesia   . COPD (chronic obstructive pulmonary disease) ([place])   . Crohn's disease ([place])    . Depression    after death of husband  . Hyperlipidemia   . Hypertension   . Myocardial infarction ([place])   . Osteopenia   . Peptic ulcer disease   . Pneumonia   . PONV (postoperative nausea and vomiting)     Past Surgical History:  Procedure Laterality Date  . ABDOMINAL SURGERY    . CARDIAC CATHETERIZATION     with stent  . COLONOSCOPY WITH PROPOFOL N/A 09/27/2014   Procedure: COLONOSCOPY WITH PROPOFOL;  Surgeon: [name], MD;  Location: [facility] ENDOSCOPY;  Service: Gastroenterology;  Laterality: N/A;  . COLONOSCOPY WITH PROPOFOL N/A 06/21/2019   Procedure: COLONOSCOPY WITH PROPOFOL;  Surgeon: Lin Landsman, MD;  Location: [facility] ENDOSCOPY;  Service: Gastroenterology;  Laterality: N/A;  . EYE SURGERY     bilateral cataract surgeries  . SHOULDER SURGERY Right    x 2   . SMALL INTESTINE SURGERY       reports that she quit smoking about 48 years ago. She has never used smokeless tobacco. She reports current alcohol use. She reports that she does not use drugs.  Allergies  Allergen Reactions  . Penicillins Other (See Comments)    Reaction:  Unknown  Has patient had a PCN reaction causing immediate rash, facial/tongue/throat swelling, SOB or lightheadedness with hypotension: unknown Has patient had a PCN reaction causing severe rash involving mucus membranes or skin necrosis: unknown Has patient had a PCN reaction that required hospitalization No Has patient had a  PCN reaction occurring within the last 10 years: No If all of the above answers are "NO", then may proceed with Cephalosporin use.   . Augmentin [Amoxicillin-Pot Clavulanate] Other (See Comments)    Reaction:  Unknown   . Indocin [Indomethacin] Other (See Comments)    Reaction:  Unknown   . Lodine [Etodolac] Other (See Comments)    Reaction:   GI upset   . Methotrexate Derivatives Other (See Comments)    Reaction:  GI upset   . Gold Rash  . Zantac [Ranitidine Hcl] Rash    Family History  Problem Relation  Age of Onset  . Hypertension Other       Prior to Admission medications   Medication Sig Start Date End Date Taking? Authorizing Provider  acetaminophen (TYLENOL) 500 MG tablet Take 500-1,000 mg by mouth every 6 (six) hours as needed for mild pain, moderate pain or fever.    [provider]  Adalimumab 40 MG/0.8ML PSKT Inject 40 mg into the skin every 30 (thirty) days.     [provider]  albuterol (PROVENTIL HFA;VENTOLIN HFA) 108 (90 BASE) MCG/ACT inhaler Inhale 2 puffs into the lungs every 6 (six) hours as needed for wheezing or shortness of breath.    [provider]  amitriptyline (ELAVIL) 10 MG tablet Take 20 mg by mouth at bedtime.    [provider]  ANORO ELLIPTA 62.5-25 MCG/INH AEPB Inhale 1 puff into the lungs daily. 05/29/19   [provider]  aspirin EC 81 MG tablet Take 81 mg by mouth daily.    [provider]  atorvastatin (LIPITOR) 80 MG tablet Take 80 mg by mouth daily.    [provider]  Calcium-Phosphorus-Vitamin D (CITRACAL +D3 PO) Take 1 tablet by mouth daily.    [provider]  Cobalamin Combinations (VITAMIN B12-FOLIC ACID) 595-638 MCG TABS Take 1 tablet by mouth as directed.    [provider]  dextromethorphan-guaiFENesin (MUCINEX DM) 30-600 MG 12hr tablet Take 1 tablet by mouth 2 (two) times daily as needed for cough. 03/11/20   Nicole Kindred A, DO  ENTRESTO 24-26 MG TAKE ONE TABLET BY MOUTH TWICE DAILY Patient taking differently: Take 1 tablet by mouth 2 (two) times daily. 02/29/20   Alisa Graff, FNP  esomeprazole (NEXIUM) 40 MG capsule Take 40 mg by mouth daily. 05/29/19   [provider]  folic acid (FOLVITE) 756 MCG tablet Take 400 mcg by mouth daily.    [provider]  leflunomide (ARAVA) 10 MG tablet Take 10 mg by mouth daily. 03/29/19   [provider]  mesalamine (PENTASA) 500 MG CR capsule Take 1,000 mg by mouth 2 (two) times daily.    [provider]  metoprolol succinate (TOPROL-XL) 50 MG 24 hr tablet Take 50 mg by mouth daily.     [provider]  Multiple Vitamin (MULTIVITAMIN WITH MINERALS) TABS tablet Take 1 tablet by mouth daily.    [provider]  potassium chloride (KLOR-CON) 10 MEQ tablet Take 20 mEq by mouth daily.    [provider]  predniSONE (DELTASONE) 5 MG tablet Take 3 tablets (15 mg total) by mouth daily with breakfast. 03/11/20   Nicole Kindred A, DO  thiamine (VITAMIN B-1) 100 MG tablet Take 100 mg by mouth daily.    [provider]  torsemide (DEMADEX) 20 MG tablet Take 2 tablets (40 mg total) by mouth 2 (two) times daily. 05/23/20 08/21/20  Alisa Graff, FNP  vitamin E 400  UNIT capsule Take 400 Units by mouth daily.    [provider]    Physical Exam: Vitals:   07/04/20 1904 07/04/20 1905 07/04/20 2115 07/04/20 2230  BP: (!) 149/87  (!) 154/122 (!) 160/87  Pulse: 78  60 97  Resp: (!) 22  20 20   Temp: 97.7 F (36.5 C)     TempSrc: Oral     SpO2: 93%  94% 95%  Weight:  79.4 kg    Height:  5' 6"  (1.676 m)       Vitals:   07/04/20 1904 07/04/20 1905 07/04/20 2115 07/04/20 2230  BP: (!) 149/87  (!) 154/122 (!) 160/87  Pulse: 78  60 97  Resp: (!) 22  20 20   Temp: 97.7 F (36.5 C)     TempSrc: Oral     SpO2: 93%  94% 95%  Weight:  79.4 kg    Height:  5' 6"  (1.676 m)        Constitutional: Alert and oriented x 3 . Not in any apparent distress HEENT:      Head: Normocephalic and atraumatic.         Eyes: PERLA, EOMI, Conjunctivae are normal. Sclera is non-icteric.       Mouth/Throat: Mucous membranes are moist.       Neck: Supple with no signs of meningismus. Cardiovascular: Regular rate and rhythm. No murmurs, gallops, or rubs. 2+ symmetrical distal pulses are present . No JVD. 3+LE edema Respiratory: Respiratory effort normal .Lungs sounds clear bilaterally. No wheezes, crackles, or rhonchi.  Gastrointestinal: Soft, non tender, and non  distended with positive bowel sounds.  Genitourinary: No CVA tenderness. Musculoskeletal: Nontender with normal range of motion in all extremities. No cyanosis, or erythema of extremities. Neurologic:  Face is symmetric. Moving all extremities. No gross focal neurologic deficits . Skin: Skin is warm, dry.  No rash or ulcers Psychiatric: Mood and affect are normal    Labs on Admission: I have personally reviewed following labs and imaging studies  CBC: Recent Labs  Lab 07/04/20 1925  WBC 6.6  HGB 12.5  HCT 38.8  MCV 90.4  PLT 944   Basic Metabolic Panel: Recent Labs  Lab 07/04/20 1925  NA 141  K 3.5  CL 108  CO2 21*  GLUCOSE 166*  BUN 41*  CREATININE 1.32*  CALCIUM 8.1*  MG 1.2*   GFR: Estimated Creatinine Clearance: 33.7 mL/min (A) (by C-G formula based on SCr of 1.32 mg/dL (H)). Liver Function Tests: No results for input(s): AST, ALT, ALKPHOS, BILITOT, PROT, ALBUMIN in the last 168 hours. No results for input(s): LIPASE, AMYLASE in the last 168 hours. No results for input(s): AMMONIA in the last 168 hours. Coagulation Profile: No results for input(s): INR, PROTIME in the last 168 hours. Cardiac Enzymes: No results for input(s): CKTOTAL, CKMB, CKMBINDEX, TROPONINI in the last 168 hours. BNP (last 3 results) No results for input(s): PROBNP in the last 8760 hours. HbA1C: No results for input(s): HGBA1C in the last 72 hours. CBG: No results for input(s): GLUCAP in the last 168 hours. Lipid Profile: No results for input(s): CHOL, HDL, LDLCALC, TRIG, CHOLHDL, LDLDIRECT in the last 72 hours. Thyroid Function Tests: No results for input(s): TSH, T4TOTAL, FREET4, T3FREE, THYROIDAB in the last 72 hours. Anemia Panel: No results for input(s): VITAMINB12, FOLATE, FERRITIN, TIBC, IRON, RETICCTPCT in the last 72 hours. Urine analysis:    Component Value Date/Time   COLORURINE YELLOW (A) 07/04/2020 2116   APPEARANCEUR CLEAR (A) 07/04/2020 2116  APPEARANCEUR Clear  06/08/2014 2052   LABSPEC 1.009 07/04/2020 2116   LABSPEC 1.015 06/08/2014 2052   PHURINE 5.0 07/04/2020 2116   GLUCOSEU NEGATIVE 07/04/2020 2116   GLUCOSEU Negative 06/08/2014 2052   HGBUR NEGATIVE 07/04/2020 2116   BILIRUBINUR NEGATIVE 07/04/2020 2116   BILIRUBINUR Negative 06/08/2014 2052   KETONESUR NEGATIVE 07/04/2020 2116   PROTEINUR NEGATIVE 07/04/2020 2116   NITRITE NEGATIVE 07/04/2020 2116   LEUKOCYTESUR LARGE (A) 07/04/2020 2116   LEUKOCYTESUR Negative 06/08/2014 2052    Radiological Exams on Admission: DG Chest 2 View  Result Date: 07/04/2020 CLINICAL DATA:  Dizziness, nausea, tachypnea starting today. EXAM: CHEST - 2 VIEW COMPARISON:  05/23/2020 FINDINGS: Shallow inspiration with elevation of the right hemidiaphragm. Mild cardiac enlargement. Diffuse interstitial pattern to the lungs could represent fibrosis, edema, or pneumonia. No pleural effusions. No pneumothorax. Mediastinal contours appear intact. Calcification of the aorta. Degenerative changes in the spine and shoulders. IMPRESSION: Diffuse interstitial pattern to the lungs could represent fibrosis, edema, or pneumonia. Mild cardiac enlargement. Electronically Signed   By: Lucienne Capers M.D.   On: 07/04/2020 21:37   CT Head Wo Contrast  Result Date: 07/04/2020 CLINICAL DATA:  Headache, dizziness, and nausea starting today. EXAM: CT HEAD WITHOUT CONTRAST TECHNIQUE: Contiguous axial images were obtained from the base of the skull through the vertex without intravenous contrast. COMPARISON:  09/16/2019 FINDINGS: Brain: No evidence of acute infarction, hemorrhage, hydrocephalus, extra-axial collection or mass lesion/mass effect. Mild diffuse cerebral atrophy. Patchy low-attenuation changes in the deep white matter consistent with small vessel ischemia. Vascular: Moderate intracranial arterial vascular calcifications. Skull: Calvarium appears intact. Sinuses/Orbits: Paranasal sinuses and mastoid air cells are clear. Other:  None. IMPRESSION: 1. No acute intracranial abnormality. 2. Chronic atrophy and small vessel ischemia. Electronically Signed   By: Lucienne Capers M.D.   On: 07/04/2020 21:46     Assessment/Plan 86 year old female with history of CAD, systolic CHF, last EF 45 to 50% on 03/09/2020, steroid-dependent COPD on chronic O2 at 1 to 2 L, Crohn's disease and rheumatoid arthritis, last hospitalized for CHF exacerbation in January 2022 who presents to the emergency room with dizziness and weakness and mild shortness of breath.     Dizziness and weakness - Suspect related to general physical deconditioning as well as dehydration likely related to diuretic use, evidenced by AKI - CT head nonacute - Gentle diuresis only but with monitoring of renal function - Check orthostatics - Can consider as needed IV fluid boluses to assess for symptomatic benefit    Acute on chronic systolic CHF - Patient with mild shortness of breath, BNP over 4500, with chest x-ray showing diffuse interstitial pattern to the lungs which could represent fibrosis edema or pneumonia - IV Lasix while monitoring for worsening renal function - Resume Entresto pending med rec - Continue beta-blocker - Daily weights, intake and output monitoring - Last echocardiogram from 03/09/2020 with EF 45 to 50% and grade 1 diastolic dysfunction    Elevated troponin   CAD - Very mild elevation to 52.  EKG nonacute and patient denies chest pain - Likely demand ischemia.  ACS not suspected  COPD, steroid-dependent Chronic respiratory failure with hypoxia (HCC) - Not acutely exacerbated - Continue home bronchodilators and continue prednisone - Oxygen requirement appears to be the same at home    Hypomagnesemia - IV mag repletion and monitor    AKI (acute kidney injury) - Creatinine 1.32, up from baseline of 0.8 - Continue to monitor and can consider as needed as needed  small fluid boluses if needed    Crohn's disease ([place])   Rheumatoid  arthritis ([place]) - Patient on chronic immunosuppressive therapy with adalimumab and leflunomide which will continue pending med rec   DVT prophylaxis: Lovenox  Code Status: full code  Family Communication:  none  Disposition Plan: Back to previous home environment Consults called: none  Status:At the time of admission, it appears that the appropriate admission status for this patient is INPATIENT. This is judged to be reasonable and necessary in order to provide the required intensity of service to ensure the patient's safety given the presenting symptoms, physical exam findings, and initial radiographic and laboratory data in the context of their  Comorbid conditions.   Patient requires inpatient status due to high intensity of service, high risk for further deterioration and high frequency of surveillance required.   I certify that at the point of admission it is my clinical judgment that the patient will require inpatient hospital care spanning beyond [place] MD [facility]     07/04/2020, 11:27 PM

## 2020-07-04 NOTE — ED Triage Notes (Signed)
Pt with c/o dizziness and nausea starting today, pt states she is usually somewhat dizzy but this time it came in waves and made her nauseated. Pt in NAD at this time, pt states dizziness has subsided at this time. Pt denies falling. Pt states she is on oxygen at home, pt not brought to ED with oxygen, pt with sats of 93 on room air.

## 2020-07-05 DIAGNOSIS — I5023 Acute on chronic systolic (congestive) heart failure: Secondary | ICD-10-CM | POA: Diagnosis not present

## 2020-07-05 DIAGNOSIS — R531 Weakness: Secondary | ICD-10-CM

## 2020-07-05 DIAGNOSIS — R42 Dizziness and giddiness: Secondary | ICD-10-CM

## 2020-07-05 DIAGNOSIS — J9611 Chronic respiratory failure with hypoxia: Secondary | ICD-10-CM

## 2020-07-05 DIAGNOSIS — E1169 Type 2 diabetes mellitus with other specified complication: Secondary | ICD-10-CM

## 2020-07-05 DIAGNOSIS — E785 Hyperlipidemia, unspecified: Secondary | ICD-10-CM

## 2020-07-05 DIAGNOSIS — J449 Chronic obstructive pulmonary disease, unspecified: Secondary | ICD-10-CM | POA: Diagnosis not present

## 2020-07-05 DIAGNOSIS — N179 Acute kidney failure, unspecified: Secondary | ICD-10-CM

## 2020-07-05 LAB — CBG MONITORING, ED
Glucose-Capillary: 103 mg/dL — ABNORMAL HIGH (ref 70–99)
Glucose-Capillary: 149 mg/dL — ABNORMAL HIGH (ref 70–99)
Glucose-Capillary: 162 mg/dL — ABNORMAL HIGH (ref 70–99)
Glucose-Capillary: 78 mg/dL (ref 70–99)

## 2020-07-05 LAB — BASIC METABOLIC PANEL
Anion gap: 9 (ref 5–15)
BUN: 38 mg/dL — ABNORMAL HIGH (ref 8–23)
CO2: 27 mmol/L (ref 22–32)
Calcium: 7.5 mg/dL — ABNORMAL LOW (ref 8.9–10.3)
Chloride: 108 mmol/L (ref 98–111)
Creatinine, Ser: 1.22 mg/dL — ABNORMAL HIGH (ref 0.44–1.00)
GFR, Estimated: 44 mL/min — ABNORMAL LOW (ref >60)
Glucose, Bld: 100 mg/dL — ABNORMAL HIGH (ref 70–99)
Potassium: 3.1 mmol/L — ABNORMAL LOW (ref 3.5–5.1)
Sodium: 144 mmol/L (ref 135–145)

## 2020-07-05 LAB — CBC
HCT: 41 % (ref 36.0–46.0)
Hemoglobin: 13.2 g/dL (ref 12.0–15.0)
MCH: 29.5 pg (ref 26.0–34.0)
MCHC: 32.2 g/dL (ref 30.0–36.0)
MCV: 91.5 fL (ref 80.0–100.0)
Platelets: 196 10*3/uL (ref 150–400)
RBC: 4.48 MIL/uL (ref 3.87–5.11)
RDW: 14.6 % (ref 11.5–15.5)
WBC: 7.6 10*3/uL (ref 4.0–10.5)
nRBC: 0.3 % — ABNORMAL HIGH (ref 0.0–0.2)

## 2020-07-05 LAB — HEMOGLOBIN A1C
Hgb A1c MFr Bld: 7.5 % — ABNORMAL HIGH (ref 4.8–5.6)
Mean Plasma Glucose: 168.55 mg/dL

## 2020-07-05 LAB — GLUCOSE, CAPILLARY: Glucose-Capillary: 175 mg/dL — ABNORMAL HIGH (ref 70–99)

## 2020-07-05 LAB — MAGNESIUM: Magnesium: 1.8 mg/dL (ref 1.7–2.4)

## 2020-07-05 LAB — CREATININE, SERUM
Creatinine, Ser: 1.42 mg/dL — ABNORMAL HIGH (ref 0.44–1.00)
GFR, Estimated: 36 mL/min — ABNORMAL LOW (ref >60)

## 2020-07-05 MED ORDER — POTASSIUM CHLORIDE CRYS ER 10 MEQ PO TBCR
10.0000 meq | EXTENDED_RELEASE_TABLET | Freq: Two times a day (BID) | ORAL | Status: DC
  Administered 2020-07-05: 10 meq via ORAL
  Filled 2020-07-05: qty 1

## 2020-07-05 MED ORDER — PANTOPRAZOLE SODIUM 40 MG PO TBEC
40.0000 mg | DELAYED_RELEASE_TABLET | Freq: Every day | ORAL | Status: DC
  Administered 2020-07-05 – 2020-07-09 (×5): 40 mg via ORAL
  Filled 2020-07-05 (×5): qty 1

## 2020-07-05 MED ORDER — POTASSIUM CHLORIDE CRYS ER 20 MEQ PO TBCR
40.0000 meq | EXTENDED_RELEASE_TABLET | Freq: Once | ORAL | Status: AC
  Administered 2020-07-05: 40 meq via ORAL
  Filled 2020-07-05: qty 2

## 2020-07-05 MED ORDER — POTASSIUM CHLORIDE CRYS ER 20 MEQ PO TBCR
10.0000 meq | EXTENDED_RELEASE_TABLET | Freq: Every day | ORAL | Status: DC
  Administered 2020-07-05: 10 meq via ORAL
  Filled 2020-07-05: qty 1

## 2020-07-05 MED ORDER — MAGNESIUM SULFATE 2 GM/50ML IV SOLN
2.0000 g | Freq: Once | INTRAVENOUS | Status: AC
  Administered 2020-07-05: 2 g via INTRAVENOUS
  Filled 2020-07-05: qty 50

## 2020-07-05 MED ORDER — ASPIRIN EC 81 MG PO TBEC
81.0000 mg | DELAYED_RELEASE_TABLET | Freq: Every day | ORAL | Status: DC
  Administered 2020-07-05 – 2020-07-09 (×5): 81 mg via ORAL
  Filled 2020-07-05 (×5): qty 1

## 2020-07-05 MED ORDER — SACUBITRIL-VALSARTAN 24-26 MG PO TABS
1.0000 | ORAL_TABLET | Freq: Two times a day (BID) | ORAL | Status: DC
  Administered 2020-07-05 – 2020-07-09 (×9): 1 via ORAL
  Filled 2020-07-05 (×10): qty 1

## 2020-07-05 MED ORDER — THIAMINE HCL 100 MG PO TABS
100.0000 mg | ORAL_TABLET | Freq: Every day | ORAL | Status: DC
  Administered 2020-07-06 – 2020-07-09 (×4): 100 mg via ORAL
  Filled 2020-07-05 (×5): qty 1

## 2020-07-05 MED ORDER — LEFLUNOMIDE 20 MG PO TABS
10.0000 mg | ORAL_TABLET | Freq: Every day | ORAL | Status: DC
  Administered 2020-07-05 – 2020-07-09 (×5): 10 mg via ORAL
  Filled 2020-07-05 (×5): qty 0.5

## 2020-07-05 MED ORDER — ALBUTEROL SULFATE (2.5 MG/3ML) 0.083% IN NEBU
2.5000 mg | INHALATION_SOLUTION | Freq: Three times a day (TID) | RESPIRATORY_TRACT | Status: DC
  Administered 2020-07-05 – 2020-07-06 (×3): 2.5 mg via RESPIRATORY_TRACT
  Filled 2020-07-05 (×3): qty 3

## 2020-07-05 MED ORDER — UMECLIDINIUM-VILANTEROL 62.5-25 MCG/INH IN AEPB
1.0000 | INHALATION_SPRAY | Freq: Every day | RESPIRATORY_TRACT | Status: DC
  Administered 2020-07-06 – 2020-07-09 (×4): 1 via RESPIRATORY_TRACT
  Filled 2020-07-05: qty 14

## 2020-07-05 MED ORDER — ATORVASTATIN CALCIUM 80 MG PO TABS
80.0000 mg | ORAL_TABLET | Freq: Every day | ORAL | Status: DC
  Administered 2020-07-05 – 2020-07-09 (×5): 80 mg via ORAL
  Filled 2020-07-05: qty 4
  Filled 2020-07-05 (×4): qty 1

## 2020-07-05 MED ORDER — PREDNISONE 10 MG PO TABS
5.0000 mg | ORAL_TABLET | Freq: Every day | ORAL | Status: DC
  Administered 2020-07-05: 5 mg via ORAL
  Filled 2020-07-05: qty 1

## 2020-07-05 MED ORDER — BENZONATATE 100 MG PO CAPS
200.0000 mg | ORAL_CAPSULE | Freq: Three times a day (TID) | ORAL | Status: DC | PRN
  Administered 2020-07-06 – 2020-07-09 (×5): 200 mg via ORAL
  Filled 2020-07-05 (×5): qty 2

## 2020-07-05 NOTE — ED Notes (Signed)
Rn aware of bed assigned

## 2020-07-05 NOTE — ED Notes (Signed)
Family given update

## 2020-07-05 NOTE — Progress Notes (Signed)
Patient ID: Raven [name], female   DOB: 03-18-1934, 86 y.o.   MRN: [id number] [facility] PROGRESS NOTE  Raven [name] JJH:[id number] DOB: 03-14-1934 DOA: 07/04/2020 PCP: Baxter Hire, MD  HPI/Subjective: Patient seen this morning and still not feeling well.  Still short of breath.  Not much different than when she came in.  She is on chronic oxygen 2 to 3 L at home.  Admitted with CHF exacerbation.  Objective: Vitals:   07/05/20 1030 07/05/20 1100  BP: (!) 144/68 (!) 143/72  Pulse:  81  Resp: (!) 23 (!) 21  Temp:    SpO2:  96%    Intake/Output Summary (Last 24 hours) at 07/05/2020 1329 Last data filed at 07/05/2020 0842 Gross per 24 hour  Intake 50 ml  Output 1250 ml  Net -1200 ml   Filed Weights   07/04/20 1905  Weight: 79.4 kg    ROS: Review of Systems  Respiratory: Positive for shortness of breath. Negative for cough and wheezing.   Cardiovascular: Negative for chest pain.  Gastrointestinal: Negative for abdominal pain, nausea and vomiting.   Exam: Physical Exam HENT:     Head: Normocephalic.     Mouth/Throat:     Pharynx: No oropharyngeal exudate.  Eyes:     General: Lids are normal.     Conjunctiva/sclera: Conjunctivae normal.     Pupils: Pupils are equal, round, and reactive to light.  Cardiovascular:     Rate and Rhythm: Normal rate and regular rhythm.     Heart sounds: Normal heart sounds, S1 normal and S2 normal.  Pulmonary:     Breath sounds: No decreased breath sounds, wheezing, rhonchi or rales.  Abdominal:     Palpations: Abdomen is soft.     Tenderness: There is no abdominal tenderness.  Musculoskeletal:     Right ankle: No swelling.     Left ankle: No swelling.  Skin:    General: Skin is warm.     Findings: No rash.  Neurological:     Mental Status: She is alert and oriented to person, place, and time.       Data Reviewed: Basic Metabolic Panel: Recent Labs  Lab 07/04/20 1925 07/04/20 2355 07/05/20 0415  NA 141  --   144  K 3.5  --  3.1*  CL 108  --  108  CO2 21*  --  27  GLUCOSE 166*  --  100*  BUN 41*  --  38*  CREATININE 1.32* 1.42* 1.22*  CALCIUM 8.1*  --  7.5*  MG 1.2*  --  1.8   CBC: Recent Labs  Lab 07/04/20 1925 07/04/20 2355  WBC 6.6 7.6  HGB 12.5 13.2  HCT 38.8 41.0  MCV 90.4 91.5  PLT 200 196   BNP (last 3 results) Recent Labs    03/08/20 0602 05/23/20 1208 07/04/20 1925  BNP 501.8* 4,388.6* >4,500.0*     CBG: Recent Labs  Lab 07/05/20 0004 07/05/20 0345 07/05/20 0836 07/05/20 1222  GLUCAP 149* 103* 78 162*    Recent Results (from the past 240 hour(s))  Resp Panel by RT-PCR (Flu A&B, Covid) Nasopharyngeal Swab     Status: None   Collection Time: 07/04/20  9:16 PM   Specimen: Nasopharyngeal Swab; Nasopharyngeal(NP) swabs in vial transport medium  Result Value Ref Range Status   SARS Coronavirus 2 by RT PCR NEGATIVE NEGATIVE Final    Comment: (NOTE) SARS-CoV-2 target nucleic acids are NOT DETECTED.  The SARS-CoV-2 RNA is generally detectable  in upper respiratory specimens during the acute phase of infection. The lowest concentration of SARS-CoV-2 viral copies this assay can detect is 138 copies/mL. A negative result does not preclude SARS-Cov-2 infection and should not be used as the sole basis for treatment or other patient management decisions. A negative result may occur with  improper specimen collection/handling, submission of specimen other than nasopharyngeal swab, presence of viral mutation(s) within the areas targeted by this assay, and inadequate number of viral copies(<138 copies/mL). A negative result must be combined with clinical observations, patient history, and epidemiological information. The expected result is Negative.  Fact Sheet for Patients:  EntrepreneurPulse.com.au  Fact Sheet for Healthcare Providers:  IncredibleEmployment.be  This test is no t yet approved or cleared by the [place] FDA  and  has been authorized for detection and/or diagnosis of SARS-CoV-2 by FDA under an Emergency Use Authorization (EUA). This EUA will remain  in effect (meaning this test can be used) for the duration of the COVID-19 declaration under Section 564(b)(1) of the Act, 21 U.S.C.section 360bbb-3(b)(1), unless the authorization is terminated  or revoked sooner.       Influenza A by PCR NEGATIVE NEGATIVE Final   Influenza B by PCR NEGATIVE NEGATIVE Final    Comment: (NOTE) The Xpert Xpress SARS-CoV-2/FLU/RSV plus assay is intended as an aid in the diagnosis of influenza from Nasopharyngeal swab specimens and should not be used as a sole basis for treatment. Nasal washings and aspirates are unacceptable for Xpert Xpress SARS-CoV-2/FLU/RSV testing.  Fact Sheet for Patients: EntrepreneurPulse.com.au  Fact Sheet for Healthcare Providers: IncredibleEmployment.be  This test is not yet approved or cleared by the [place] FDA and has been authorized for detection and/or diagnosis of SARS-CoV-2 by FDA under an Emergency Use Authorization (EUA). This EUA will remain in effect (meaning this test can be used) for the duration of the COVID-19 declaration under Section 564(b)(1) of the Act, 21 U.S.C. section 360bbb-3(b)(1), unless the authorization is terminated or revoked.  Performed at [facility], [address], [place], [place] [address]      Studies: DG Chest 2 View  Result Date: 07/04/2020 CLINICAL DATA:  Dizziness, nausea, tachypnea starting today. EXAM: CHEST - 2 VIEW COMPARISON:  05/23/2020 FINDINGS: Shallow inspiration with elevation of the right hemidiaphragm. Mild cardiac enlargement. Diffuse interstitial pattern to the lungs could represent fibrosis, edema, or pneumonia. No pleural effusions. No pneumothorax. Mediastinal contours appear intact. Calcification of the aorta. Degenerative changes in the spine and shoulders. IMPRESSION:  Diffuse interstitial pattern to the lungs could represent fibrosis, edema, or pneumonia. Mild cardiac enlargement. Electronically Signed   By: Lucienne Capers M.D.   On: 07/04/2020 21:37   CT Head Wo Contrast  Result Date: 07/04/2020 CLINICAL DATA:  Headache, dizziness, and nausea starting today. EXAM: CT HEAD WITHOUT CONTRAST TECHNIQUE: Contiguous axial images were obtained from the base of the skull through the vertex without intravenous contrast. COMPARISON:  09/16/2019 FINDINGS: Brain: No evidence of acute infarction, hemorrhage, hydrocephalus, extra-axial collection or mass lesion/mass effect. Mild diffuse cerebral atrophy. Patchy low-attenuation changes in the deep white matter consistent with small vessel ischemia. Vascular: Moderate intracranial arterial vascular calcifications. Skull: Calvarium appears intact. Sinuses/Orbits: Paranasal sinuses and mastoid air cells are clear. Other: None. IMPRESSION: 1. No acute intracranial abnormality. 2. Chronic atrophy and small vessel ischemia. Electronically Signed   By: Lucienne Capers M.D.   On: 07/04/2020 21:46    Scheduled Meds: . albuterol  2.5 mg Nebulization TID  . amitriptyline  20  mg Oral QHS  . aspirin EC  81 mg Oral Daily  . atorvastatin  80 mg Oral Daily  . enoxaparin (LOVENOX) injection  40 mg Subcutaneous Q24H  . furosemide  40 mg Intravenous BID  . insulin aspart  0-15 Units Subcutaneous Q4H  . leflunomide  10 mg Oral Daily  . mesalamine  500 mg Oral BID  . metoprolol succinate  50 mg Oral Daily  . pantoprazole  40 mg Oral Daily  . potassium chloride  10 mEq Oral Daily  . predniSONE  5 mg Oral Q breakfast  . sacubitril-valsartan  1 tablet Oral BID  . sodium chloride flush  3 mL Intravenous Q12H  . thiamine  100 mg Oral Daily  . umeclidinium-vilanterol  1 puff Inhalation Daily   Continuous Infusions: . sodium chloride    . magnesium sulfate bolus IVPB 2 g (07/05/20 1250)    Assessment/Plan:  1. Acute on chronic systolic  congestive heart failure.  Continue IV Lasix 40 mg IV twice daily.  Continue metoprolol ER, Entresto. 2. Dizziness and weakness.  Physical therapy evaluation 3. COPD end-stage steroid-dependent and chronic respiratory failure on 2 to 3 L.  Continue nebulizers and inhalers on chronic steroids. 4. Hypomagnesemia IV magnesium ordered recheck tomorrow. 5. Acute kidney injury.  Creatinine as high as 1.42 and down to 1.22 with diuresis.  Last month creatinine 0.82. 6. Type 2 diabetes mellitus with hyperlipidemia.  Hemoglobin A1c elevated at 7.5.  Diet controlled.  Continue Lipitor. 7. History of Crohn's disease and rheumatoid arthritis.  Patient on leflunomide and chronic prednisone        Code Status:     Code Status Orders  (From admission, onward)         Start     Ordered   07/04/20 2325  Full code  Continuous        07/04/20 2327        Code Status History    Date Active Date Inactive Code Status Order ID Comments User Context   03/08/2020 1913 03/11/2020 1924 Full Code [id number]  Ivor Costa, MD ED   03/08/2020 1103 03/08/2020 1913 Partial Code [id number]  Ivor Costa, MD ED   11/22/2019 2135 11/26/2019 1818 Full Code [id number]  Athena Masse, MD ED   08/25/2019 2102 08/27/2019 1931 Partial Code [id number]  Ivor Costa, MD Inpatient   08/25/2019 1729 08/25/2019 2102 Partial Code [id number]  Ivor Costa, MD ED   06/17/2019 1809 06/21/2019 2316 DNR [id number]  Fritzi Mandes, MD ED   10/20/2018 1500 10/23/2018 1317 DNR [id number]  Hillary Bow, MD ED   04/22/2016 0945 04/24/2016 1909 Full Code [id number]  Bettey Costa, MD Inpatient   04/22/2016 0932 04/22/2016 0945 DNR [id number]  Bettey Costa, MD Inpatient   04/17/2016 1853 04/20/2016 1352 Full Code [id number]  Vaughan Basta, MD Inpatient   04/19/2015 2024 04/21/2015 1714 Full Code [id number]  Dustin Flock, MD Inpatient   11/02/2014 1202 11/08/2014 1741 Full Code [id number]  Bettey Costa, MD Inpatient   Advance Care Planning Activity    Advance  Directive Documentation   Flowsheet Row Most Recent Value  Type of Advance Directive Living will  Pre-existing out of facility DNR order (yellow form or pink MOST form) --  "MOST" Form in Place? --     Family Communication: Spoke with son on the phone Disposition Plan: Status is: Inpatient  Dispo: The patient is from: Home              Anticipated d/c is to:  Home              Patient currently being diuresed with IV Lasix for acute CHF exacerbation   Difficult to place patient.  No.  Time spent: 28 minutes  [place]

## 2020-07-05 NOTE — Consult Note (Signed)
   Heart Failure Nurse Navigator Note  HFmr EF 45-50%.  Updated pulmonary artery pressures.  Moderate mitral regurgitation.  She presented to the emergency room with complaints of nausea, dizziness with standing, shortness of breath and lower extremity edema.  She states at home that she had noticed a 10 pound gradual weight gain.  Comorbidities:  Hypertension Hyperlipidemia COPD GERD Depression anxiety Coronary artery disease Anemia Rheumatoid arthritis  Labs:  Sodium 144, potassium 3.1, chloride 108, CO2 27, BUN 38, creatinine 1.22 down from 1.42 of admission (baseline creatinine is 0.8) BNP greater than 4500, magnesium 1.8, hemoglobin A1c 7.5 Weight is 79.4 kg Blood pressure 143/72  Medications:  Atorvastatin 80 mg Furosemide 40 mg IV twice daily  Entresto 24/26 mg twice daily   Assessment:  General-she is awake and alert lying in bed planing of left shoulder pain.  States that she hurt her shoulder with lying on the gurney in the emergency room.  HEENT-pupils are equal, wears glasses.  Cardiac-heart tones of regular rate and rhythm.  Chest- fine crackles in the bases posteriorly  Abdomen-soft nontender  Musculoskeletal-1+ edema  Psych-is pleasant and appropriate, makes eye contact does become tearful when she does this as she feels like her health is declining this last few weeks.   Neurologic-speech is clear moves all extremities.    Initial meeting with patient.  She states that she had noticed a gradual 10 pound weight gain this last week and had seen her family doctor, cardiologist and her lung doctor.  He states that with her COPD she wears oxygen all the time at 2 L and that Dr. Raul Del increased to her he saw her yesterday up to 3 L per nasal cannula.   She does admit to using salt and eats out frequently at restaurants.  Discussed some pointers when she is eating at restaurants asked them not to flavor steaks and etc. with her seasonings, to use Mrs.  Dash.  She admits that she likes her salt, but she does not use it now at the extent that she has in the past.  Explained that it would be the best thing she could do for her self if she would completely cut out the salt, explained the reasoning behind sodium/salt and fluids.  She voices understanding.  Given living with heart failure teaching booklet along with his zone magnet and appointment to follow-up in the heart failure clinic.   Pricilla Riffle RN [facility]

## 2020-07-05 NOTE — ED Notes (Signed)
Pt noted to soil linens. Pt placed in new brief and linens changed at this time.

## 2020-07-05 NOTE — Evaluation (Signed)
Physical Therapy Evaluation Patient Details Name: Raven [name] MRN: [id number] DOB: May 24, 1934 Today's Date: 07/05/2020   History of Present Illness  Pt is an 86 y.o. female with medical history significant for CAD, systolic CHF, last EF 45 to 50% on 03/09/2020, steroid-dependent COPD on chronic O2 at 1 to 2 L, Crohn's disease and rheumatoid arthritis, last hospitalized for CHF exacerbation in January 2022 who presents to the emergency room with nausea, dizziness.  MD assessment includes: Acute on chronic systolic congestive heart failure, Hypomagnesemia, AKI, dizziness, and weakness.    Clinical Impression  Pt was pleasant and motivated to participate during the session.  Pt found on 3.5LO2/min with SpO2 and HR WNL during the session.  Pt required physical assistance with bed mobility and was somewhat limited from L shoulder pain that the pt reported began "several days ago" but was exacerbated by lying on a gurney recently.  During transfer training the pt required min A to prevent posterior LOB each time she stood up but once steadied the pt was able to ambulate with slow, cautious cadence but without LOB with a RW.  Pt reported no adverse symptoms during the session other than L shoulder pain.  Pt is at a very high risk for falls and would not be safe to return to her prior living situation at this time.  Pt will benefit from PT services in a SNF setting upon discharge to safely address deficits listed in patient problem list for decreased caregiver assistance and eventual return to PLOF.      Follow Up Recommendations SNF;Supervision/Assistance - 24 hour    Equipment Recommendations  None recommended by PT    Recommendations for Other Services       Precautions / Restrictions Precautions Precautions: Fall Restrictions Weight Bearing Restrictions: No      Mobility  Bed Mobility Overal bed mobility: Needs Assistance Bed Mobility: Supine to Sit;Sit to Supine     Supine to sit:  Mod assist;Min assist Sit to supine: Mod assist;Min assist   General bed mobility comments: Min to mod A for BLE and trunk control with pt unable to use LUE to assist secondary to shoulder pain    Transfers Overall transfer level: Needs assistance Equipment used: Rolling walker (2 wheeled) Transfers: Sit to/from Stand Sit to Stand: Min assist         General transfer comment: Multiple sit to/from stand transfers, pt required min A to prevent posterior LOB each time  Ambulation/Gait Ambulation/Gait assistance: Min guard Gait Distance (Feet): 15 Feet Assistive device: Rolling walker (2 wheeled) Gait Pattern/deviations: Decreased step length - right;Step-through pattern;Decreased step length - left Gait velocity: decreased   General Gait Details: Slow cadence with short B step length but steady without LOB  Stairs            Wheelchair Mobility    Modified Rankin (Stroke Patients Only)       Balance Overall balance assessment: Needs assistance   Sitting balance-Leahy Scale: Good       Standing balance-Leahy Scale: Poor Standing balance comment: Min A to prevent posterior LOB upon initial stand                             Pertinent Vitals/Pain Pain Assessment: 0-10 Pain Score: 9  Pain Location: L shoulder Pain Descriptors / Indicators: Aching;Sore Pain Intervention(s): Premedicated before session;Monitored during session;Patient requesting pain meds-RN notified    Home Living Family/patient expects to be discharged  to:: Private residence Living Arrangements: Alone Available Help at Discharge: Family;Available PRN/intermittently Type of Home: House Home Access: Stairs to enter Entrance Stairs-Rails: Can reach both;Right;Left Entrance Stairs-Number of Steps: 3 Home Layout: One level Home Equipment: Cane - single point;Walker - 2 wheels;Grab bars - tub/shower;Shower seat - built in Additional Comments: Son lives close and available for  intermittent assist, uses O2 24/7    Prior Function Level of Independence: Independent         Comments: Ind amb without an AD community distances, no fall history, Ind with ADLs, drives     Hand Dominance   Dominant Hand: Right    Extremity/Trunk Assessment   Upper Extremity Assessment Upper Extremity Assessment: Generalized weakness;LUE deficits/detail LUE: Unable to fully assess due to pain    Lower Extremity Assessment Lower Extremity Assessment: Generalized weakness       Communication   Communication: No difficulties  Cognition Arousal/Alertness: Awake/alert Behavior During Therapy: WFL for tasks assessed/performed Overall Cognitive Status: Within Functional Limits for tasks assessed                                        General Comments      Exercises     Assessment/Plan    PT Assessment Patient needs continued PT services  PT Problem List Decreased strength;Decreased activity tolerance;Decreased balance;Decreased mobility;Decreased knowledge of use of DME       PT Treatment Interventions DME instruction;Gait training;Stair training;Functional mobility training;Therapeutic activities;Therapeutic exercise;Balance training;Patient/family education    PT Goals (Current goals can be found in the Care Plan section)  Acute Rehab PT Goals Patient Stated Goal: To get stronger PT Goal Formulation: With patient Time For Goal Achievement: 07/18/20 Potential to Achieve Goals: Good    Frequency Min 2X/week   Barriers to discharge Inaccessible home environment;Decreased caregiver support      Co-evaluation               AM-PAC PT "6 Clicks" Mobility  Outcome Measure Help needed turning from your back to your side while in a flat bed without using bedrails?: A Lot Help needed moving from lying on your back to sitting on the side of a flat bed without using bedrails?: A Lot Help needed moving to and from a bed to a chair (including a  wheelchair)?: A Lot Help needed standing up from a chair using your arms (e.g., wheelchair or bedside chair)?: A Little Help needed to walk in hospital room?: A Little Help needed climbing 3-5 steps with a railing? : A Lot 6 Click Score: 14    End of Session Equipment Utilized During Treatment: Gait belt;Oxygen Activity Tolerance: Patient tolerated treatment well Patient left: in bed;with call bell/phone within reach;Other (comment) (B rails up as pt found in ED) Nurse Communication: Mobility status PT Visit Diagnosis: Unsteadiness on feet (R26.81);Difficulty in walking, not elsewhere classified (R26.2);Muscle weakness (generalized) (M62.81)    Time: 8616-8372 PT Time Calculation (min) (ACUTE ONLY): 33 min   Charges:   PT Evaluation $PT Eval Moderate Complexity: 1 Mod          D. Scott Jabree Pernice PT, DPT 07/05/20, 4:05 PM

## 2020-07-06 ENCOUNTER — Inpatient Hospital Stay: Payer: Medicare Other

## 2020-07-06 DIAGNOSIS — K50919 Crohn's disease, unspecified, with unspecified complications: Secondary | ICD-10-CM

## 2020-07-06 DIAGNOSIS — J441 Chronic obstructive pulmonary disease with (acute) exacerbation: Secondary | ICD-10-CM | POA: Diagnosis not present

## 2020-07-06 DIAGNOSIS — M25512 Pain in left shoulder: Secondary | ICD-10-CM

## 2020-07-06 DIAGNOSIS — I5023 Acute on chronic systolic (congestive) heart failure: Secondary | ICD-10-CM | POA: Diagnosis not present

## 2020-07-06 DIAGNOSIS — M109 Gout, unspecified: Secondary | ICD-10-CM

## 2020-07-06 LAB — BASIC METABOLIC PANEL
Anion gap: 11 (ref 5–15)
BUN: 28 mg/dL — ABNORMAL HIGH (ref 8–23)
CO2: 25 mmol/L (ref 22–32)
Calcium: 7.5 mg/dL — ABNORMAL LOW (ref 8.9–10.3)
Chloride: 106 mmol/L (ref 98–111)
Creatinine, Ser: 1.11 mg/dL — ABNORMAL HIGH (ref 0.44–1.00)
GFR, Estimated: 49 mL/min — ABNORMAL LOW (ref >60)
Glucose, Bld: 99 mg/dL (ref 70–99)
Potassium: 3.1 mmol/L — ABNORMAL LOW (ref 3.5–5.1)
Sodium: 142 mmol/L (ref 135–145)

## 2020-07-06 LAB — GLUCOSE, CAPILLARY
Glucose-Capillary: 119 mg/dL — ABNORMAL HIGH (ref 70–99)
Glucose-Capillary: 141 mg/dL — ABNORMAL HIGH (ref 70–99)
Glucose-Capillary: 186 mg/dL — ABNORMAL HIGH (ref 70–99)
Glucose-Capillary: 269 mg/dL — ABNORMAL HIGH (ref 70–99)
Glucose-Capillary: 82 mg/dL (ref 70–99)
Glucose-Capillary: 86 mg/dL (ref 70–99)

## 2020-07-06 LAB — URIC ACID: Uric Acid, Serum: 11.2 mg/dL — ABNORMAL HIGH (ref 2.5–7.1)

## 2020-07-06 LAB — URINE CULTURE

## 2020-07-06 MED ORDER — COLCHICINE 0.6 MG PO TABS
0.6000 mg | ORAL_TABLET | Freq: Every day | ORAL | Status: DC
  Administered 2020-07-06 – 2020-07-09 (×4): 0.6 mg via ORAL
  Filled 2020-07-06 (×4): qty 1

## 2020-07-06 MED ORDER — ALBUTEROL SULFATE (2.5 MG/3ML) 0.083% IN NEBU
2.5000 mg | INHALATION_SOLUTION | Freq: Two times a day (BID) | RESPIRATORY_TRACT | Status: DC
  Administered 2020-07-06 – 2020-07-08 (×5): 2.5 mg via RESPIRATORY_TRACT
  Filled 2020-07-06 (×5): qty 3

## 2020-07-06 MED ORDER — COLCHICINE 0.6 MG PO TABS
0.6000 mg | ORAL_TABLET | Freq: Once | ORAL | Status: AC
  Administered 2020-07-06: 0.6 mg via ORAL
  Filled 2020-07-06: qty 1

## 2020-07-06 MED ORDER — INSULIN ASPART 100 UNIT/ML IJ SOLN
0.0000 [IU] | Freq: Three times a day (TID) | INTRAMUSCULAR | Status: DC
  Administered 2020-07-06: 2 [IU] via SUBCUTANEOUS
  Administered 2020-07-06: 8 [IU] via SUBCUTANEOUS
  Administered 2020-07-07: 3 [IU] via SUBCUTANEOUS
  Administered 2020-07-07: 5 [IU] via SUBCUTANEOUS
  Administered 2020-07-07: 3 [IU] via SUBCUTANEOUS
  Administered 2020-07-08: 8 [IU] via SUBCUTANEOUS
  Administered 2020-07-08: 2 [IU] via SUBCUTANEOUS
  Administered 2020-07-08: 8 [IU] via SUBCUTANEOUS
  Administered 2020-07-09: 2 [IU] via SUBCUTANEOUS
  Filled 2020-07-06 (×8): qty 1

## 2020-07-06 MED ORDER — POTASSIUM CHLORIDE CRYS ER 20 MEQ PO TBCR
30.0000 meq | EXTENDED_RELEASE_TABLET | Freq: Two times a day (BID) | ORAL | Status: DC
  Administered 2020-07-06 (×2): 30 meq via ORAL
  Filled 2020-07-06 (×2): qty 1

## 2020-07-06 MED ORDER — FUROSEMIDE 40 MG PO TABS
40.0000 mg | ORAL_TABLET | Freq: Two times a day (BID) | ORAL | Status: DC
  Administered 2020-07-06 – 2020-07-08 (×5): 40 mg via ORAL
  Filled 2020-07-06 (×4): qty 1

## 2020-07-06 MED ORDER — INSULIN ASPART 100 UNIT/ML IJ SOLN: 0.0000 [IU] | Freq: Every day | INTRAMUSCULAR | Status: DC

## 2020-07-06 MED ORDER — METHYLPREDNISOLONE SODIUM SUCC 40 MG IJ SOLR
40.0000 mg | Freq: Every day | INTRAMUSCULAR | Status: DC
  Administered 2020-07-06 – 2020-07-07 (×2): 40 mg via INTRAVENOUS
  Filled 2020-07-06 (×3): qty 1

## 2020-07-06 NOTE — TOC Initial Note (Signed)
Transition of Care [facility]) - Initial/Assessment Note    Patient Details  Name: Raven [name] MRN: [id number] Date of Birth: [birth year]  Transition of [facility]) CM/SW Contact:    Harriet Masson, RN Phone Number:[phone] 07/06/2020, 4:52 PM  Clinical Narrative:                 Spoke with pt today for SNF choice. Pt only requested [facility] and if she is unable to get into this facility willing to go home with [facility]. Pt lives alone with assistance from her son. Pt also has a daughter. Pt aware of the process for placement and receptive to start this process. Will obtain a PASRR and complete FL2 for [facility]. No other needs at this time.   TOC will continue to follow for discharge planning needs.   Expected Discharge Plan: Skilled Nursing Facility Barriers to Discharge: Continued Medical Work up   Patient Goals and CMS Choice     Choice offered to / list presented to : Patient  Expected Discharge Plan and Services Expected Discharge Plan: [place]   Discharge Planning Services: CM Consult   Living arrangements for the past 2 months: Single Family Home                                      Prior Living Arrangements/Services Living arrangements for the past 2 months: Single Family Home Lives with:: Self Patient language and need for interpreter reviewed:: Yes Do you feel safe going back to the place where you live?: Yes      Need for Family Participation in Patient Care: Yes (Comment) Care giver support system in place?: Yes (comment)   Criminal Activity/Legal Involvement Pertinent to Current Situation/Hospitalization: No - Comment as needed  Activities of Daily Living Home Assistive Devices/Equipment: None ADL Screening (condition at time of admission) Patient's cognitive ability adequate to safely complete daily activities?: Yes Is the patient deaf or have difficulty hearing?: No Does the patient have difficulty seeing,  even when wearing glasses/contacts?: No Does the patient have difficulty concentrating, remembering, or making decisions?: No Patient able to express need for assistance with ADLs?: No Does the patient have difficulty dressing or bathing?: No Independently performs ADLs?: Yes (appropriate for developmental age) Does the patient have difficulty walking or climbing stairs?: No Weakness of Legs: None Weakness of Arms/Hands: None  Permission Sought/Granted   Permission granted to share information with : Yes, Verbal Permission Granted              Emotional Assessment Appearance:: Appears stated age Attitude/Demeanor/Rapport: Engaged Affect (typically observed): Accepting Orientation: : Oriented to Self,Oriented to Place,Oriented to  Time,Oriented to Situation Alcohol / Substance Use: Not Applicable Psych Involvement: No (comment)  Admission diagnosis:  Hypomagnesemia [E83.42] Troponin I above reference range [R77.8] Acute on chronic respiratory failure with hypoxia (HCC) [J96.21] Acute heart failure, unspecified heart failure type ([place]) [I50.9] Acute on chronic systolic (congestive) heart failure ([place]) [I50.23] Patient Active Problem List   Diagnosis Date Noted  . Acute gout of left ankle   . Acute pain of left shoulder   . Type 2 diabetes mellitus with hyperlipidemia ([place])   . AKI (acute kidney injury) ([place]) 07/04/2020  . Dizziness 07/04/2020  . Dyspnea 03/09/2020  . Nausea vomiting and diarrhea 03/08/2020  . Hyperkalemia   . Hypomagnesemia   . Pain in both lower extremities   . Generalized  weakness   . Rheumatoid arthritis ([place])   . Acute on chronic systolic CHF (congestive heart failure) ([place]) 11/22/2019  . Acute on chronic systolic (congestive) heart failure ([place]) 11/22/2019  . Chronic respiratory failure with hypoxia ([place]) 11/22/2019  . HLD (hyperlipidemia) 08/25/2019  . HTN (hypertension) 08/25/2019  . Depression with anxiety   . CAD (coronary artery disease)   .  Elevated troponin   . Hypokalemia   . Crohn's disease ([place])   . Acute on chronic respiratory failure with hypoxia ([place])   . 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 SBO (small bowel obstruction) ([place]) 06/17/2019  . Crohn's disease of small intestine with intestinal obstruction ([place])   . COPD with acute exacerbation ([place]) 10/20/2018  . COPD (chronic obstructive pulmonary disease) ([place]) 04/22/2016  . Acute bronchitis 04/17/2016  . Sepsis ([place]) 04/17/2016  . Peroneal tendonitis 07/18/2015  . Pneumonia 04/19/2015  . CAP (community acquired pneumonia) 11/02/2014   PCP:  Baxter Hire, MD Pharmacy:   [place], [place] - [place] [address] [place] [address] Phone: [phone] Fax: [phone]  [place], [place] - [place] [address] Phone: [phone] Fax: [phone]  [id number] [place] [place], [place] - [place] [facility] [place] [address] [place] [address] Phone: [phone] Fax: [phone]     Social Determinants of Health ([place]) Interventions    Readmission Risk Interventions Readmission Risk Prevention Plan 03/11/2020 11/25/2019  Transportation Screening Complete Complete  Medication Review Press photographer) Complete Complete  PCP or Specialist appointment within 3-5 days of discharge Complete Complete  HRI or Home Care Consult Complete Complete  SW Recovery Care/Counseling Consult Complete Complete  Palliative Care Screening Not Applicable Not [place] Complete Not Applicable  Some recent data might be hidden

## 2020-07-06 NOTE — Progress Notes (Signed)
Patient ID: Raven [name], female   DOB: 06-05-1934, 86 y.o.   MRN: [id number] [facility] PROGRESS NOTE  Raven [name] QMG:[id number] DOB: 07/01/1934 DOA: 07/04/2020 PCP: Baxter Hire, MD  HPI/Subjective: Patient complains that her breathing is about the same as when she came in.  Also complaining of severe pain in her left ankle and under the right foot.  Physical therapy team mentioned to me that she could not hardly move her left arm.  Patient has pain to any type of movement in her left arm and with palpation.  Admitted with CHF exacerbation.  Objective: Vitals:   07/06/20 0921 07/06/20 1209  BP: (!) 122/50 126/60  Pulse: 90 88  Resp: 16 17  Temp: 99.8 F (37.7 C) 99 F (37.2 C)  SpO2: 94% 93%    Intake/Output Summary (Last 24 hours) at 07/06/2020 1343 Last data filed at 07/06/2020 1300 Gross per 24 hour  Intake 360 ml  Output --  Net 360 ml   Filed Weights   07/04/20 1905 07/05/20 1836  Weight: 79.4 kg 78 kg    ROS: Review of Systems  Respiratory: Positive for shortness of breath.   Cardiovascular: Negative for chest pain.  Gastrointestinal: Negative for abdominal pain, nausea and vomiting.  Musculoskeletal: Positive for joint pain.   Exam: Physical Exam HENT:     Head: Normocephalic.     Mouth/Throat:     Pharynx: No oropharyngeal exudate.  Eyes:     General: Lids are normal.     Conjunctiva/sclera: Conjunctivae normal.     Pupils: Pupils are equal, round, and reactive to light.  Cardiovascular:     Rate and Rhythm: Normal rate and regular rhythm.     Heart sounds: Normal heart sounds, S1 normal and S2 normal.  Pulmonary:     Breath sounds: Examination of the right-lower field reveals decreased breath sounds. Examination of the left-lower field reveals decreased breath sounds. Decreased breath sounds present. No wheezing, rhonchi or rales.  Abdominal:     Palpations: Abdomen is soft.     Tenderness: There is no abdominal tenderness.   Musculoskeletal:     Left shoulder: Tenderness present. Decreased range of motion.     Right ankle: No swelling.     Left ankle: No swelling. Decreased range of motion.     Comments: Right foot pain to palpation near the plantar fascia on the heel. Patient very limited movement with her left shoulder.  Pain to palpation of the biceps tendon groove lateral left shoulder and also posteriorly.  Skin:    General: Skin is warm.     Comments: Toes on the right foot a little bit red.  Neurological:     Mental Status: She is alert and oriented to person, place, and time.       Data Reviewed: Basic Metabolic Panel: Recent Labs  Lab 07/04/20 1925 07/04/20 2355 07/05/20 0415 07/06/20 0529  NA 141  --  144 142  K 3.5  --  3.1* 3.1*  CL 108  --  108 106  CO2 21*  --  27 25  GLUCOSE 166*  --  100* 99  BUN 41*  --  38* 28*  CREATININE 1.32* 1.42* 1.22* 1.11*  CALCIUM 8.1*  --  7.5* 7.5*  MG 1.2*  --  1.8  --    Liver Function Tests: No results for input(s): AST, ALT, ALKPHOS, BILITOT, PROT, ALBUMIN in the last 168 hours. No results for input(s): LIPASE, AMYLASE in the last  168 hours. No results for input(s): AMMONIA in the last 168 hours. CBC: Recent Labs  Lab 07/04/20 1925 07/04/20 2355  WBC 6.6 7.6  HGB 12.5 13.2  HCT 38.8 41.0  MCV 90.4 91.5  PLT 200 196   Cardiac Enzymes: No results for input(s): CKTOTAL, CKMB, CKMBINDEX, TROPONINI in the last 168 hours. BNP (last 3 results) Recent Labs    03/08/20 0602 05/23/20 1208 07/04/20 1925  BNP 501.8* 4,388.6* >4,500.0*    ProBNP (last 3 results) No results for input(s): PROBNP in the last 8760 hours.  CBG: Recent Labs  Lab 07/05/20 2050 07/06/20 0014 07/06/20 0406 07/06/20 0923 07/06/20 1208  GLUCAP 175* 82 86 119* 141*    Recent Results (from the past 240 hour(s))  Resp Panel by RT-PCR (Flu A&B, Covid) Nasopharyngeal Swab     Status: None   Collection Time: 07/04/20  9:16 PM   Specimen: Nasopharyngeal  Swab; Nasopharyngeal(NP) swabs in vial transport medium  Result Value Ref Range Status   SARS Coronavirus 2 by RT PCR NEGATIVE NEGATIVE Final    Comment: (NOTE) SARS-CoV-2 target nucleic acids are NOT DETECTED.  The SARS-CoV-2 RNA is generally detectable in upper respiratory specimens during the acute phase of infection. The lowest concentration of SARS-CoV-2 viral copies this assay can detect is 138 copies/mL. A negative result does not preclude SARS-Cov-2 infection and should not be used as the sole basis for treatment or other patient management decisions. A negative result may occur with  improper specimen collection/handling, submission of specimen other than nasopharyngeal swab, presence of viral mutation(s) within the areas targeted by this assay, and inadequate number of viral copies(<138 copies/mL). A negative result must be combined with clinical observations, patient history, and epidemiological information. The expected result is Negative.  Fact Sheet for Patients:  EntrepreneurPulse.com.au  Fact Sheet for Healthcare Providers:  IncredibleEmployment.be  This test is no t yet approved or cleared by the [place] FDA and  has been authorized for detection and/or diagnosis of SARS-CoV-2 by FDA under an Emergency Use Authorization (EUA). This EUA will remain  in effect (meaning this test can be used) for the duration of the COVID-19 declaration under Section 564(b)(1) of the Act, 21 U.S.C.section 360bbb-3(b)(1), unless the authorization is terminated  or revoked sooner.       Influenza A by PCR NEGATIVE NEGATIVE Final   Influenza B by PCR NEGATIVE NEGATIVE Final    Comment: (NOTE) The Xpert Xpress SARS-CoV-2/FLU/RSV plus assay is intended as an aid in the diagnosis of influenza from Nasopharyngeal swab specimens and should not be used as a sole basis for treatment. Nasal washings and aspirates are unacceptable for Xpert Xpress  SARS-CoV-2/FLU/RSV testing.  Fact Sheet for Patients: EntrepreneurPulse.com.au  Fact Sheet for Healthcare Providers: IncredibleEmployment.be  This test is not yet approved or cleared by the [place] FDA and has been authorized for detection and/or diagnosis of SARS-CoV-2 by FDA under an Emergency Use Authorization (EUA). This EUA will remain in effect (meaning this test can be used) for the duration of the COVID-19 declaration under Section 564(b)(1) of the Act, 21 U.S.C. section 360bbb-3(b)(1), unless the authorization is terminated or revoked.  Performed at [facility], [address], [place], [place] [address]   Urine Culture     Status: Abnormal   Collection Time: 07/04/20  9:16 PM   Specimen: Urine, Random  Result Value Ref Range Status   Specimen Description   Final    URINE, RANDOM Performed at [facility], [address], [place], [place] [address]    Special Requests   Final    NONE Performed at [facility], [address] [place],  [address]    Culture MULTIPLE SPECIES PRESENT, SUGGEST RECOLLECTION (A)  Final   Report Status 07/06/2020 FINAL  Final     Studies: DG Chest 2 View  Result Date: 07/04/2020 CLINICAL DATA:  Dizziness, nausea, tachypnea starting today. EXAM: CHEST - 2 VIEW COMPARISON:  05/23/2020 FINDINGS: Shallow inspiration with elevation of the right hemidiaphragm. Mild cardiac enlargement. Diffuse interstitial pattern to the lungs could represent fibrosis, edema, or pneumonia. No pleural effusions. No pneumothorax. Mediastinal contours appear intact. Calcification of the aorta. Degenerative changes in the spine and shoulders. IMPRESSION: Diffuse interstitial pattern to the lungs could represent fibrosis, edema, or pneumonia. Mild cardiac enlargement. Electronically Signed   By: Lucienne Capers M.D.   On: 07/04/2020 21:37   CT Head Wo Contrast  Result Date:  07/04/2020 CLINICAL DATA:  Headache, dizziness, and nausea starting today. EXAM: CT HEAD WITHOUT CONTRAST TECHNIQUE: Contiguous axial images were obtained from the base of the skull through the vertex without intravenous contrast. COMPARISON:  09/16/2019 FINDINGS: Brain: No evidence of acute infarction, hemorrhage, hydrocephalus, extra-axial collection or mass lesion/mass effect. Mild diffuse cerebral atrophy. Patchy low-attenuation changes in the deep white matter consistent with small vessel ischemia. Vascular: Moderate intracranial arterial vascular calcifications. Skull: Calvarium appears intact. Sinuses/Orbits: Paranasal sinuses and mastoid air cells are clear. Other: None. IMPRESSION: 1. No acute intracranial abnormality. 2. Chronic atrophy and small vessel ischemia. Electronically Signed   By: Lucienne Capers M.D.   On: 07/04/2020 21:46    Scheduled Meds: . albuterol  2.5 mg Nebulization BID  . amitriptyline  20 mg Oral QHS  . aspirin EC  81 mg Oral Daily  . atorvastatin  80 mg Oral Daily  . colchicine  0.6 mg Oral Daily  . colchicine  0.6 mg Oral Once  . enoxaparin (LOVENOX) injection  40 mg Subcutaneous Q24H  . furosemide  40 mg Oral BID  . insulin aspart  0-15 Units Subcutaneous TID WC  . insulin aspart  0-5 Units Subcutaneous QHS  . leflunomide  10 mg Oral Daily  . mesalamine  500 mg Oral BID  . methylPREDNISolone (SOLU-MEDROL) injection  40 mg Intravenous Daily  . metoprolol succinate  50 mg Oral Daily  . pantoprazole  40 mg Oral Daily  . potassium chloride  30 mEq Oral BID  . sacubitril-valsartan  1 tablet Oral BID  . sodium chloride flush  3 mL Intravenous Q12H  . thiamine  100 mg Oral Daily  . umeclidinium-vilanterol  1 puff Inhalation Daily   Continuous Infusions: . sodium chloride      Assessment/Plan:  1. Acute on chronic systolic congestive heart failure.  Switch Lasix over to oral since patient complaining of acute pain in her feet and likely gout.  Continue  metoprolol ER and Entresto. 2. COPD exacerbation.  End-stage on chronic steroids and chronic oxygen.  Start Solu-Medrol.  Continue nebulizers and inhalers. 3. Acute pain left ankle and under right foot.  Likely acute gout with uric acid being high.  I ordered Solu-Medrol today.  Will give colchicine this morning and again this afternoon. 4. Left shoulder pain and decreased range of motion.  Patient thinks that she heard a pop in it a few days ago.  Less likely gout but steroids would help if this were the case.  We will get a shoulder x-ray and orthopedic evaluation.  5. Acute kidney injury creatinine 1.42 on presentation and down to 1.11.  Baseline creatinine 0.82. 6. Type 2 diabetes mellitus with hyperlipidemia.  Hemoglobin A1c elevated at 7.5.  Patient on diet control.  Sugars likely be higher on steroids.  Continue Lipitor. 7. Crohn's disease and rheumatoid arthritis.  Patient on leflunomide and chronic prednisone. 8. Weakness.  Physical therapy recommending rehab 9. Hypokalemia replace potassium orally     Code Status:     Code Status Orders  (From admission, onward)         Start     Ordered   07/04/20 2325  Full code  Continuous        07/04/20 2327        Code Status History    Date Active Date Inactive Code Status Order ID Comments User Context   03/08/2020 1913 03/11/2020 1924 Full Code [id number]  Ivor Costa, MD ED   03/08/2020 1103 03/08/2020 1913 Partial Code [id number]  Ivor Costa, MD ED   11/22/2019 2135 11/26/2019 1818 Full Code [id number]  Athena Masse, MD ED   08/25/2019 2102 08/27/2019 1931 Partial Code [id number]  Ivor Costa, MD Inpatient   08/25/2019 1729 08/25/2019 2102 Partial Code [id number]  Ivor Costa, MD ED   06/17/2019 1809 06/21/2019 2316 DNR [id number]  Fritzi Mandes, MD ED   10/20/2018 1500 10/23/2018 1317 DNR [id number]  Hillary Bow, MD ED   04/22/2016 0945 04/24/2016 1909 Full Code [id number]  Bettey Costa, MD Inpatient   04/22/2016 0932 04/22/2016 0945 DNR [id number]   Bettey Costa, MD Inpatient   04/17/2016 1853 04/20/2016 1352 Full Code [id number]  Vaughan Basta, MD Inpatient   04/19/2015 2024 04/21/2015 1714 Full Code [id number]  Dustin Flock, MD Inpatient   11/02/2014 1202 11/08/2014 1741 Full Code [id number]  Bettey Costa, MD Inpatient   Advance Care Planning Activity    Advance Directive Documentation   Flowsheet Row Most Recent Value  Type of Advance Directive Living will  Pre-existing out of facility DNR order (yellow form or pink MOST form) --  "MOST" Form in Place? --     Family Communication: Spoke with son at the bedside Disposition Plan: Status is: Inpatient  Dispo: The patient is from: Home              Anticipated d/c is to: Rehab versus home depending on rehab selections.              Patient currently being treated for acute gout, COPD exacerbation and CHF.   Difficult to place patient.  No  Consultants:  Orthopedic surgery  Time spent: 28 minutes  [place]

## 2020-07-06 NOTE — NC FL2 (Signed)
[place] LEVEL OF CARE SCREENING TOOL     IDENTIFICATION  Patient Name: Raven [name] Birthdate: 1934-04-07 Sex: female Admission Date (Current Location): 07/04/2020  [place] and [place] Number:  Engineering geologist and Address:  [facility], [address], [place], [place] [address]      Provider Number: [id number]  Attending Physician Name and Address:  Loletha Grayer, MD  Relative Name and Phone Number:  Elson Areas (Daughter)   [phone] [facility] Phone)    Current Level of Care: Hospital Recommended Level of Care: [place] Prior Approval Number:    Date Approved/Denied:   PASRR Number: [id number]  Discharge Plan:      Current Diagnoses: Patient Active Problem List   Diagnosis Date Noted  . Acute gout of left ankle   . Acute pain of left shoulder   . Type 2 diabetes mellitus with hyperlipidemia ([place])   . AKI (acute kidney injury) ([place]) 07/04/2020  . Dizziness 07/04/2020  . Dyspnea 03/09/2020  . Nausea vomiting and diarrhea 03/08/2020  . Hyperkalemia   . Hypomagnesemia   . Pain in both lower extremities   . Generalized weakness   . Rheumatoid arthritis ([place])   . Acute on chronic systolic CHF (congestive heart failure) ([place]) 11/22/2019  . Acute on chronic systolic (congestive) heart failure ([place]) 11/22/2019  . Chronic respiratory failure with hypoxia ([place]) 11/22/2019  . HLD (hyperlipidemia) 08/25/2019  . HTN (hypertension) 08/25/2019  . Depression with anxiety   . CAD (coronary artery disease)   . Elevated troponin   . Hypokalemia   . Crohn's disease ([place])   . Acute on chronic respiratory failure with hypoxia ([place])   . 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 SBO (small bowel obstruction) ([place]) 06/17/2019  . Crohn's disease of small intestine with intestinal obstruction ([place])   . COPD with acute exacerbation ([place]) 10/20/2018  . COPD (chronic obstructive pulmonary disease) ([place]) 04/22/2016  . Acute  bronchitis 04/17/2016  . Sepsis ([place]) 04/17/2016  . Peroneal tendonitis 07/18/2015  . Pneumonia 04/19/2015  . CAP (community acquired pneumonia) 11/02/2014    Orientation RESPIRATION BLADDER Height & Weight     Self,Time,Situation,Place  O2 Continent,External catheter Weight: 171 lb 14.4 oz (78 kg) Height:  5' 6"  (167.6 cm)  BEHAVIORAL SYMPTOMS/MOOD NEUROLOGICAL BOWEL NUTRITION STATUS      Continent Diet (heart diet; thin liquids)  AMBULATORY STATUS COMMUNICATION OF NEEDS Skin   Limited Assist Verbally Other (Comment) (rash - foot)                       Personal Care Assistance Level of Assistance  Bathing,Feeding,Dressing Bathing Assistance: Limited assistance Feeding assistance: Independent Dressing Assistance: Limited assistance     Functional Limitations Info             SPECIAL CARE FACTORS FREQUENCY  PT (By licensed PT),OT (By licensed OT)     PT Frequency: 5 x/week OT Frequency: 5 x/week            Contractures      Additional Factors Info  Code Status,Allergies Code Status Info: full code Allergies Info: penicillins, augmentin, indocin, lodine, methotrexate derivatives, gold, zantac           Current Medications (07/06/2020):  This is the current hospital active medication list Current Facility-Administered Medications  Medication Dose Route Frequency Provider Last Rate Last Admin  . 0.9 %  sodium chloride infusion  250 mL Intravenous PRN Athena Masse, MD      .  acetaminophen (TYLENOL) tablet 650 mg  650 mg Oral Q4H PRN Judd Gaudier V, MD      . albuterol (PROVENTIL) (2.5 MG/3ML) 0.083% nebulizer solution 2.5 mg  2.5 mg Nebulization BID Wieting, Richard, MD      . ALPRAZolam Duanne Moron) tablet 0.25 mg  0.25 mg Oral BID PRN Athena Masse, MD      . amitriptyline (ELAVIL) tablet 20 mg  20 mg Oral QHS Athena Masse, MD   20 mg at 07/05/20 2132  . aspirin EC tablet 81 mg  81 mg Oral Daily Loletha Grayer, MD   81 mg at 07/06/20 0956  .  atorvastatin (LIPITOR) tablet 80 mg  80 mg Oral Daily Loletha Grayer, MD   80 mg at 07/06/20 0956  . benzonatate (TESSALON) capsule 200 mg  200 mg Oral TID PRN Athena Masse, MD   200 mg at 07/06/20 0412  . colchicine tablet 0.6 mg  0.6 mg Oral Daily Loletha Grayer, MD   0.6 mg at 07/06/20 0956  . enoxaparin (LOVENOX) injection 40 mg  40 mg Subcutaneous Q24H Athena Masse, MD   40 mg at 07/06/20 0958  . furosemide (LASIX) tablet 40 mg  40 mg Oral BID Loletha Grayer, MD   40 mg at 07/06/20 1013  . insulin aspart (novoLOG) injection 0-15 Units  0-15 Units Subcutaneous TID WC Loletha Grayer, MD   2 Units at 07/06/20 1302  . insulin aspart (novoLOG) injection 0-5 Units  0-5 Units Subcutaneous QHS Wieting, Richard, MD      . leflunomide (ARAVA) tablet 10 mg  10 mg Oral Daily Loletha Grayer, MD   10 mg at 07/06/20 0955  . mesalamine (PENTASA) CR capsule 500 mg  500 mg Oral BID Athena Masse, MD   500 mg at 07/06/20 0955  . methylPREDNISolone sodium succinate (SOLU-MEDROL) 40 mg/mL injection 40 mg  40 mg Intravenous Daily Loletha Grayer, MD   40 mg at 07/06/20 0957  . metoprolol succinate (TOPROL-XL) 24 hr tablet 50 mg  50 mg Oral Daily Athena Masse, MD   50 mg at 07/06/20 0956  . ondansetron (ZOFRAN) injection 4 mg  4 mg Intravenous Q6H PRN Athena Masse, MD      . pantoprazole (PROTONIX) EC tablet 40 mg  40 mg Oral Daily Loletha Grayer, MD   40 mg at 07/06/20 0957  . potassium chloride (KLOR-CON) CR tablet 30 mEq  30 mEq Oral BID Loletha Grayer, MD   30 mEq at 07/06/20 0956  . sacubitril-valsartan (ENTRESTO) 24-26 mg per tablet  1 tablet Oral BID Loletha Grayer, MD   1 tablet at 07/06/20 [phone]  . sodium chloride flush (NS) 0.9 % injection 3 mL  3 mL Intravenous Q12H Athena Masse, MD   3 mL at 07/06/20 0953  . sodium chloride flush (NS) 0.9 % injection 3 mL  3 mL Intravenous PRN Athena Masse, MD      . thiamine tablet 100 mg  100 mg Oral Daily Loletha Grayer, MD   100  mg at 07/06/20 0956  . umeclidinium-vilanterol (ANORO ELLIPTA) 62.5-25 MCG/INH 1 puff  1 puff Inhalation Daily Loletha Grayer, MD   1 puff at 07/06/20 4540     Discharge Medications: Please see discharge summary for a list of discharge medications.  Relevant Imaging Results:  Relevant Lab Results:   Additional Information SS #: [id number]  [id number] H [id number], [id number]

## 2020-07-06 NOTE — Progress Notes (Signed)
Mobility Specialist - Progress Note   07/06/20 1503  Mobility  Activity Off unit  Mobility performed by Mobility specialist    Pt off unit for imaging at time of attempt. Will attempt session another date/time as pt becomes available.    Kathee Delton Mobility Specialist 07/06/20, 3:04 PM

## 2020-07-06 NOTE — Evaluation (Signed)
Occupational Therapy Evaluation Patient Details Name: Raven [name] MRN: [id number] DOB: 03-31-1934 Today's Date: 07/06/2020    History of Present Illness Pt is an 86 y.o. female with medical history significant for CAD, systolic CHF, last EF 45 to 50% on 03/09/2020, steroid-dependent COPD on chronic O2 at 1 to 2 L, Crohn's disease and rheumatoid arthritis, last hospitalized for CHF exacerbation in January 2022 who presents to the emergency room with nausea, dizziness.  MD assessment includes: Acute on chronic systolic congestive heart failure, Hypomagnesemia, AKI, dizziness, and weakness.   Clinical Impression   Pt seen for OT Evaluation this date in setting of acute hospitalization d/t CHF exacerbation. In addition, pt with c/o gout pain to b/l feet and notably, she is having new L UE pain which she reports started one day before coming to the hospital, and she heard a "pop". RN and MD notified by this Chief Strategy Officer. OT notes that pt with limited L shld and elbow flexion. Pt reports living alone and completing all I/ADLs at baseline. This date, pt requiring MIN/MOD A with ADL transfers with RW. MOD A for LB ADLs (limited by gout pain in b/l feet per pt), MIN A for seated UB ADLs d/t limited L UE ROM 2/2 pain. Pt requires MIN A for UB bathing tasks and MOD A for LB bathing tasks. OT leaves pt in chair with chair alarm and all needs met and in reach. RN notified of session contents. Will continue to follow acutely. At this time, anticipate pt will require STR f/u upon d/c from acute setting.     Follow Up Recommendations  SNF    Equipment Recommendations  3 in 1 bedside commode;Tub/shower seat;Other (comment) (2ww)    Recommendations for Other Services       Precautions / Restrictions Precautions Precautions: Fall Restrictions Weight Bearing Restrictions: No      Mobility Bed Mobility Overal bed mobility: Needs Assistance Bed Mobility: Supine to Sit     Supine to sit: Mod assist;Min  assist     General bed mobility comments: MIN/MOD A primarily for LE assist d/t pain from gout in feet (per pt)    Transfers Overall transfer level: Needs assistance Equipment used: Rolling walker (2 wheeled) Transfers: Sit to/from [facility] Sit to Stand: Min assist;Mod assist Stand pivot transfers: Min assist;Mod assist       General transfer comment: increased time, 2-3 unsuccessful attempts before coming to stand, first successful trial was from bed elevated ~1-2 inches. Pt requires cues for safe hand placement with RW including to control descent from stand to sit.    Balance Overall balance assessment: Needs assistance Sitting-balance support: Feet supported Sitting balance-Leahy Scale: Good Sitting balance - Comments: G static sitting     Standing balance-Leahy Scale: Poor Standing balance comment: Min A to prevent posterior LOB. Needs UE support d/t b/l foot pain                           ADL either performed or assessed with clinical judgement   ADL Overall ADL's : Needs assistance/impaired                                       General ADL Comments: MIN/MOD A with ADL transfers with RW. MOD A for LB ADLs (limited by gout pain in b/l feet per pt), MIN A for seated UB  ADLs d/t limited L UE ROM 2/2 pain (RN/MD notified as this is not r/t current hospitalization, but still impacting ADL performance).     Vision Baseline Vision/History: Wears glasses Wears Glasses: At all times Patient Visual Report: No change from baseline       Perception     Praxis      Pertinent Vitals/Pain Pain Assessment: 0-10 Pain Score: 6  Pain Location: L shoulder Pain Descriptors / Indicators: Aching;Sore Pain Intervention(s): Monitored during session;Premedicated before session     Hand Dominance Right   Extremity/Trunk Assessment Upper Extremity Assessment Upper Extremity Assessment: Generalized weakness;LUE deficits/detail LUE  Deficits / Details: decreased shld and elbow flexion d/t pain, reports hearing a "pop" in L UE at home the day before being hospitalized. RN/MD notified.   Lower Extremity Assessment Lower Extremity Assessment: Generalized weakness (limited by gout pain currently in b/l feet)       Communication Communication Communication: No difficulties   Cognition Arousal/Alertness: Awake/alert Behavior During Therapy: WFL for tasks assessed/performed Overall Cognitive Status: Within Functional Limits for tasks assessed                                     General Comments       Exercises Other Exercises Other Exercises: OT educates re: importance of OOB Activity for prevention of skin breakdown and PNA. Pt with good understanding and agreeable to OOB activity. OT Engages pt in bathing tasks and transfer to commode, then to chair with chair alarm with MIN/MOD A.   Shoulder Instructions      Home Living Family/patient expects to be discharged to:: Private residence Living Arrangements: Alone Available Help at Discharge: Family;Available PRN/intermittently Type of Home: House Home Access: Stairs to enter [employer] of Steps: 3 Entrance Stairs-Rails: Can reach both;Right;Left Home Layout: One level     Bathroom Shower/Tub: Occupational psychologist: Handicapped height Bathroom Accessibility: Yes   Home Equipment: Cane - single point;Walker - 2 wheels;Grab bars - tub/shower;Shower seat - built in   Additional Comments: Son lives close and available for intermittent assist, uses O2 24/7      Prior Functioning/Environment Level of Independence: Independent        Comments: Ind amb without an AD community distances, no fall history, Ind with ADLs, drives        OT Problem List: Decreased strength;Decreased range of motion;Decreased activity tolerance;Impaired balance (sitting and/or standing);Decreased knowledge of use of DME or AE;Cardiopulmonary  status limiting activity;Pain;Increased edema      OT Treatment/Interventions: Self-care/ADL training;DME and/or AE instruction;Therapeutic activities;Balance training;Therapeutic exercise;Energy conservation;Patient/family education    OT Goals(Current goals can be found in the care plan section) Acute Rehab OT Goals Patient Stated Goal: To get stronger OT Goal Formulation: With patient Time For Goal Achievement: 07/20/20 Potential to Achieve Goals: Good ADL Goals Pt Will Perform Lower Body Bathing: with supervision;sit to/from stand (with LRAD/AE PRN) Pt Will Perform Lower Body Dressing: with supervision;sit to/from stand (with LRAD/AE PRN) Pt Will Transfer to Toilet: with supervision;ambulating;bedside commode;grab bars (with RW to/from restroom) Pt/caregiver will Perform Home Exercise Program: Increased strength;Both right and left upper extremity;With Supervision  OT Frequency: Min 1X/week   Barriers to D/C:            Co-evaluation              AM-PAC OT "6 Clicks" Daily Activity     Outcome Measure Help  from another person eating meals?: None Help from another person taking care of personal grooming?: A Little Help from another person toileting, which includes using toliet, bedpan, or urinal?: A Little Help from another person bathing (including washing, rinsing, drying)?: A Lot Help from another person to put on and taking off regular upper body clothing?: A Little Help from another person to put on and taking off regular lower body clothing?: A Lot 6 Click Score: 17   End of Session Equipment Utilized During Treatment: Gait belt;Rolling walker;Oxygen Nurse Communication: Mobility status;Other (comment) (notified RN that pt with L UE pain and that OT requested the MD check. Also notified CNA that pt had participated in bathing during OT session as well as toileting)  Activity Tolerance: Patient tolerated treatment well Patient left: in chair;with call bell/phone  within reach;with chair alarm set  OT Visit Diagnosis: Unsteadiness on feet (R26.81);Muscle weakness (generalized) (M62.81);Pain Pain - Right/Left: Left Pain - part of body: Arm (bilateral feet)                Time: 5868-2574 OT Time Calculation (min): 46 min Charges:  OT General Charges $OT Visit: 1 Visit OT Evaluation $OT Eval Moderate Complexity: 1 Mod OT Treatments $Self Care/Home Management : 23-37 mins $Therapeutic Activity: 8-22 mins  Gerrianne Scale, MS, OTR/L ascom [phone] 07/06/20, 1:52 PM

## 2020-07-07 DIAGNOSIS — J441 Chronic obstructive pulmonary disease with (acute) exacerbation: Secondary | ICD-10-CM | POA: Diagnosis not present

## 2020-07-07 DIAGNOSIS — M109 Gout, unspecified: Secondary | ICD-10-CM | POA: Diagnosis not present

## 2020-07-07 DIAGNOSIS — M25512 Pain in left shoulder: Secondary | ICD-10-CM | POA: Diagnosis not present

## 2020-07-07 DIAGNOSIS — I5023 Acute on chronic systolic (congestive) heart failure: Secondary | ICD-10-CM | POA: Diagnosis not present

## 2020-07-07 DIAGNOSIS — N189 Chronic kidney disease, unspecified: Secondary | ICD-10-CM

## 2020-07-07 LAB — BASIC METABOLIC PANEL
Anion gap: 11 (ref 5–15)
BUN: 26 mg/dL — ABNORMAL HIGH (ref 8–23)
CO2: 28 mmol/L (ref 22–32)
Calcium: 7.7 mg/dL — ABNORMAL LOW (ref 8.9–10.3)
Chloride: 105 mmol/L (ref 98–111)
Creatinine, Ser: 1.02 mg/dL — ABNORMAL HIGH (ref 0.44–1.00)
GFR, Estimated: 54 mL/min — ABNORMAL LOW (ref >60)
Glucose, Bld: 170 mg/dL — ABNORMAL HIGH (ref 70–99)
Potassium: 5 mmol/L (ref 3.5–5.1)
Sodium: 144 mmol/L (ref 135–145)

## 2020-07-07 LAB — GLUCOSE, CAPILLARY
Glucose-Capillary: 204 mg/dL — ABNORMAL HIGH (ref 70–99)
Glucose-Capillary: 167 mg/dL — ABNORMAL HIGH (ref 70–99)
Glucose-Capillary: 177 mg/dL — ABNORMAL HIGH (ref 70–99)
Glucose-Capillary: 169 mg/dL — ABNORMAL HIGH (ref 70–99)

## 2020-07-07 MED ORDER — METOPROLOL SUCCINATE ER 50 MG PO TB24
75.0000 mg | ORAL_TABLET | Freq: Every day | ORAL | Status: DC
  Administered 2020-07-08 – 2020-07-09 (×2): 75 mg via ORAL
  Filled 2020-07-07 (×2): qty 1

## 2020-07-07 MED ORDER — HYDROCOD POLST-CPM POLST ER 10-8 MG/5ML PO SUER: 5.0000 mL | Freq: Two times a day (BID) | ORAL | Status: DC | PRN

## 2020-07-07 MED ORDER — METOPROLOL SUCCINATE ER 25 MG PO TB24
25.0000 mg | ORAL_TABLET | Freq: Once | ORAL | Status: AC
  Administered 2020-07-07: 25 mg via ORAL
  Filled 2020-07-07: qty 1

## 2020-07-07 MED ORDER — GUAIFENESIN 100 MG/5ML PO SOLN
5.0000 mL | ORAL | Status: DC | PRN
  Administered 2020-07-08: 100 mg via ORAL
  Filled 2020-07-07 (×2): qty 5

## 2020-07-07 MED ORDER — SODIUM ZIRCONIUM CYCLOSILICATE 10 G PO PACK: 10.0000 g | PACK | Freq: Once | ORAL | Status: DC

## 2020-07-07 NOTE — Consult Note (Addendum)
ORTHOPAEDIC CONSULTATION  REQUESTING PHYSICIAN: Loletha Grayer, MD  Chief Complaint:   Left shoulder pain  History of Present Illness: Raven [name] is a 86 y.o. female with multiple medical issues, including congestive heart failure status post prior myocardial infarction, hypertension, hyperlipidemia, COPD, asthma, Crohn's disease, peptic ulcer disease, osteopenia, ankylosing spondylitis, and depression who was admitted to the hospital 3 days ago for an exacerbation of her COPD.  The patient recalls trying to reach behind her back around the day of admission and feeling a sharp pain in the anterior aspect of the left shoulder.  Since then, she has had increased pain in her shoulder with difficulty raising her arm even to shoulder level and prompting this orthopedic consultation.  The patient notes that over the past 24 hours or so, her symptoms have improved significantly, although she still is apprehensive about lifting her arm up above shoulder level.    Normally, the patient sees Dr. Precious Reel at the [facility] for her rheumatologic and arthritic issues.  She has received numerous steroid injections into her right shoulder by Dr. Jefm Bryant for right shoulder pain following two prior rotator cuff repair procedures performed by Dr. Marry Guan.  The patient notes that she has not had any issues with her left shoulder prior to the recent flare-up of her symptoms as described above.  The patient denies any neck pain, nor did she note any numbness or paresthesias down her arm to her hand.  Past Medical History:  Diagnosis Date  . Anemia   . Arthritis   . Asthma   . CHF (congestive heart failure) ([place])   . Complication of anesthesia   . COPD (chronic obstructive pulmonary disease) ([place])   . Crohn's disease ([place])   . Depression    after death of husband  . Hyperlipidemia   . Hypertension   . Myocardial infarction ([place])    . Osteopenia   . Peptic ulcer disease   . Pneumonia   . PONV (postoperative nausea and vomiting)    Past Surgical History:  Procedure Laterality Date  . ABDOMINAL SURGERY    . CARDIAC CATHETERIZATION     with stent  . COLONOSCOPY WITH PROPOFOL N/A 09/27/2014   Procedure: COLONOSCOPY WITH PROPOFOL;  Surgeon: [name], MD;  Location: [facility] ENDOSCOPY;  Service: Gastroenterology;  Laterality: N/A;  . COLONOSCOPY WITH PROPOFOL N/A 06/21/2019   Procedure: COLONOSCOPY WITH PROPOFOL;  Surgeon: Lin Landsman, MD;  Location: [facility] ENDOSCOPY;  Service: Gastroenterology;  Laterality: N/A;  . EYE SURGERY     bilateral cataract surgeries  . SHOULDER SURGERY Right    x 2   . SMALL INTESTINE SURGERY     Social History   Socioeconomic History  . Marital status: Widowed    Spouse name: Not on file  . Number of children: Not on file  . Years of education: Not on file  . Highest education level: Not on file  Occupational History  . Not on file  Tobacco Use  . Smoking status: Former Smoker    Quit date: 1974    Years since quitting: 48.3  . Smokeless tobacco: Never Used  Vaping Use  . Vaping Use: Never used  Substance and Sexual Activity  . Alcohol use: Yes    Comment: occassional  . Drug use: No  . Sexual activity: Never    Birth control/protection: Abstinence  Other Topics Concern  . Not on file  Social History Narrative  . Not on file   Social Determinants  of Health   Financial Resource Strain: Not on file  Food Insecurity: Not on file  Transportation Needs: Not on file  Physical Activity: Not on file  Stress: Not on file  Social Connections: Not on file   Family History  Problem Relation Age of Onset  . Hypertension Other    Allergies  Allergen Reactions  . Penicillins Other (See Comments)    Reaction:  Unknown  Has patient had a PCN reaction causing immediate rash, facial/tongue/throat swelling, SOB or lightheadedness with hypotension: unknown Has patient had a  PCN reaction causing severe rash involving mucus membranes or skin necrosis: unknown Has patient had a PCN reaction that required hospitalization No Has patient had a PCN reaction occurring within the last 10 years: No If all of the above answers are "NO", then may proceed with Cephalosporin use.   . Augmentin [Amoxicillin-Pot Clavulanate] Other (See Comments)    Reaction:  Unknown   . Indocin [Indomethacin] Other (See Comments)    Reaction:  Unknown   . Lodine [Etodolac] Other (See Comments)    Reaction:   GI upset   . Methotrexate Derivatives Other (See Comments)    Reaction:  GI upset   . Gold Rash  . Zantac [Ranitidine Hcl] Rash   Prior to Admission medications   Medication Sig Start Date End Date Taking? Authorizing Provider  acetaminophen (TYLENOL) 500 MG tablet Take 500-1,000 mg by mouth every 6 (six) hours as needed for mild pain, moderate pain or fever.   Yes [provider]  Adalimumab 40 MG/0.8ML PSKT Inject 40 mg into the skin every 30 (thirty) days.    Yes [provider]  albuterol (PROVENTIL HFA;VENTOLIN HFA) 108 (90 BASE) MCG/ACT inhaler Inhale 2 puffs into the lungs every 6 (six) hours as needed for wheezing or shortness of breath.   Yes [provider]  amitriptyline (ELAVIL) 10 MG tablet Take 20 mg by mouth at bedtime.   Yes [provider]  ANORO ELLIPTA 62.5-25 MCG/INH AEPB Inhale 1 puff into the lungs daily. 05/29/19  Yes [provider]  aspirin EC 81 MG tablet Take 81 mg by mouth daily.   Yes [provider]  atorvastatin (LIPITOR) 80 MG tablet Take 80 mg by mouth daily.   Yes [provider]  Calcium-Phosphorus-Vitamin D (CITRACAL +D3 PO) Take 1 tablet by mouth daily.   Yes [provider]  Cobalamin Combinations (VITAMIN B12-FOLIC ACID) 564-332 MCG TABS Take 1 tablet by mouth as directed.   Yes [provider]  dextromethorphan-guaiFENesin (MUCINEX DM) 30-600 MG 12hr tablet Take 1  tablet by mouth 2 (two) times daily as needed for cough. 03/11/20  Yes Nicole Kindred A, DO  ENTRESTO 24-26 MG TAKE ONE TABLET BY MOUTH TWICE DAILY Patient taking differently: Take 1 tablet by mouth 2 (two) times daily. 02/29/20  Yes Hackney, Otila Kluver A, FNP  esomeprazole (NEXIUM) 40 MG capsule Take 40 mg by mouth daily. 05/29/19  Yes [provider]  folic acid (FOLVITE) 951 MCG tablet Take 400 mcg by mouth daily.   Yes [provider]  leflunomide (ARAVA) 10 MG tablet Take 10 mg by mouth daily. 03/29/19  Yes [provider]  mesalamine (PENTASA) 500 MG CR capsule Take 500 mg by mouth 2 (two) times daily.   Yes [provider]  metoprolol succinate (TOPROL-XL) 50 MG 24 hr tablet Take 50 mg by mouth daily.    Yes [provider]  Multiple Vitamin (MULTIVITAMIN WITH MINERALS) TABS tablet Take 1  tablet by mouth daily.   Yes [provider]  ondansetron (ZOFRAN-ODT) 4 MG disintegrating tablet Take 4 mg by mouth every 12 (twelve) hours as needed for nausea/vomiting. 07/03/20  Yes [provider]  potassium chloride (KLOR-CON) 10 MEQ tablet Take 10 mEq by mouth daily.   Yes [provider]  predniSONE (DELTASONE) 1 MG tablet Take 2 mg by mouth at bedtime.   Yes [provider]  predniSONE (DELTASONE) 5 MG tablet Take 3 tablets (15 mg total) by mouth daily with breakfast. Patient taking differently: Take 5 mg by mouth daily with breakfast. 03/11/20  Yes Nicole Kindred A, DO  thiamine (VITAMIN B-1) 100 MG tablet Take 100 mg by mouth daily.   Yes [provider]  torsemide (DEMADEX) 20 MG tablet Take 2 tablets (40 mg total) by mouth 2 (two) times daily. 05/23/20 08/21/20 Yes Darylene Price A, FNP  vitamin E 400 UNIT capsule Take 400 Units by mouth daily.   Yes [provider]   DG Shoulder Left  Result Date: 07/06/2020 CLINICAL DATA:  Left shoulder pain without known injury. EXAM: LEFT SHOULDER - 2+ VIEW COMPARISON:   None. FINDINGS: There is no evidence of fracture or dislocation. There is no evidence of arthropathy or other focal bone abnormality. Soft tissues are unremarkable. IMPRESSION: Negative. Electronically Signed   By: Dorise Bullion III M.D   On: 07/06/2020 14:58    Positive ROS: All other systems have been reviewed and were otherwise negative with the exception of those mentioned in the HPI and as above.  Physical Exam: General:  Alert, no acute distress Psychiatric:  Patient is competent for consent with normal mood and affect   Cardiovascular:  No pedal edema Respiratory:  No wheezing, non-labored breathing GI:  Abdomen is soft and non-tender Skin:  No lesions in the area of chief complaint Neurologic:  Sensation intact distally Lymphatic:  No axillary or cervical lymphadenopathy  Orthopedic Exam:  Orthopedic examination is limited to the left shoulder and upper extremity.  Skin inspection around the left shoulder is unremarkable.  No swelling, erythema, ecchymosis, abrasions, or other skin abnormalities are identified.  She has mild-moderate tenderness diffusely over the anterior lateral aspects of the shoulder.  Actively, she is able to forward flex to 150 degrees and abduct to 140 degrees although she is quite apprehensive with these motions and frequently tries to use her right hand to help support her left arm.  Passively, she can tolerate forward flexion to 160 degrees and abduction to 155 degrees.  At 90 degrees of abduction, she can tolerate external rotation to 90 degrees and internal rotation to 60 degrees.  She has mild pain at the extremes of these motions.  She exhibits 4/5 strength with resisted internal and external rotation, as well as with resisted forward flexion.  She has 3+-4/5 strength with resisted abduction.  She has mild pain with resisted forward flexion and abduction more so than with resisted external rotation.  She is neurovascularly intact to the left upper extremity and  hand.  X-rays:  Recent x-rays of the left shoulder are available for review.  These films demonstrate no evidence for fractures, lytic lesions, or significant degenerative changes.  The subacromial space is mildly decreased.  She exhibits a Type I-II acromion.  Assessment: Impingement/tendinopathy with possible rotator cuff tear of left shoulder, presently stable symptomatically.  Plan: The treatment options have been discussed with the patient.  The patient feels that her symptoms are improving at this point and  so would like to avoid any further intervention.  Specifically, she was offered but declines a steroid injection into the left shoulder.  She is encouraged to continue with her normal daily activities, but avoid offending activities.  She may take over-the-counter medications as needed for discomfort.  Thank you for asking me to participate in the care of this most pleasant woman.  I will be happy to follow her with you.   Pascal Lux, MD  Beeper #:  [phone]  07/07/2020 11:46 AM

## 2020-07-07 NOTE — Plan of Care (Signed)

## 2020-07-07 NOTE — Progress Notes (Signed)
CCMD called and told patient had another SVT @916  with HR in 170's, 10 beats, back to normal sinus rhythm   Patient assessed, she is sitting in bed and talking to her son. No sign and symptom of distress. Patient stated she is feeling fine.

## 2020-07-07 NOTE — Progress Notes (Signed)
CCMD called this am to give patient report, @0724  patient had multifocal PVC, that was 4 beats, it ran into SVT with irregular HR 150. Provider notified via secure chat

## 2020-07-07 NOTE — Progress Notes (Signed)
Patient ID: Raven [name], female   DOB: May 01, 1934, 86 y.o.   MRN: [id number] [facility] PROGRESS NOTE  Raven [name] LEX:[id number] DOB: Nov 22, 1934 DOA: 07/04/2020 PCP: Baxter Hire, MD  HPI/Subjective: Patient coughing a lot this morning.  Nursing staff called me with short burst of SVT but this could be secondary to the coughing.  Her pain in her left ankle is less than yesterday.  Pain in the right heel is less than yesterday.  Her pain in the left shoulder is less.  Patient still with limited range of motion left shoulder.  Initially admitted for CHF exacerbation but also treating for COPD exacerbation.  Objective: Vitals:   07/07/20 0821 07/07/20 1205  BP: (!) 130/94 126/76  Pulse: 90 87  Resp: 16 18  Temp: 97.9 F (36.6 C) 98.9 F (37.2 C)  SpO2: 97% 91%   No intake or output data in the 24 hours ending 07/07/20 1305 Filed Weights   07/04/20 1905 07/05/20 1836  Weight: 79.4 kg 78 kg    ROS: Review of Systems  Respiratory: Positive for cough and shortness of breath.   Cardiovascular: Negative for chest pain.  Gastrointestinal: Negative for abdominal pain, nausea and vomiting.  Musculoskeletal: Positive for joint pain.   Exam: Physical Exam HENT:     Head: Normocephalic.     Mouth/Throat:     Pharynx: No oropharyngeal exudate.  Eyes:     General: Lids are normal.     Conjunctiva/sclera: Conjunctivae normal.  Cardiovascular:     Rate and Rhythm: Normal rate and regular rhythm.     Heart sounds: Normal heart sounds, S1 normal and S2 normal.  Pulmonary:     Breath sounds: Examination of the right-lower field reveals decreased breath sounds and rhonchi. Examination of the left-lower field reveals decreased breath sounds and rhonchi. Decreased breath sounds and rhonchi present. No wheezing or rales.  Abdominal:     Palpations: Abdomen is soft.     Tenderness: There is no abdominal tenderness.  Musculoskeletal:     Right ankle: No swelling.     Left  ankle: No swelling.  Skin:    General: Skin is warm.     Findings: No rash.  Neurological:     Mental Status: She is alert and oriented to person, place, and time.       Data Reviewed: Basic Metabolic Panel: Recent Labs  Lab 07/04/20 1925 07/04/20 2355 07/05/20 0415 07/06/20 0529 07/07/20 0518  NA 141  --  144 142 144  K 3.5  --  3.1* 3.1* 5.0  CL 108  --  108 106 105  CO2 21*  --  27 25 28   GLUCOSE 166*  --  100* 99 170*  BUN 41*  --  38* 28* 26*  CREATININE 1.32* 1.42* 1.22* 1.11* 1.02*  CALCIUM 8.1*  --  7.5* 7.5* 7.7*  MG 1.2*  --  1.8  --   --    CBC: Recent Labs  Lab 07/04/20 1925 07/04/20 2355  WBC 6.6 7.6  HGB 12.5 13.2  HCT 38.8 41.0  MCV 90.4 91.5  PLT 200 196   BNP (last 3 results) Recent Labs    03/08/20 0602 05/23/20 1208 07/04/20 1925  BNP 501.8* 4,388.6* >4,500.0*    CBG: Recent Labs  Lab 07/06/20 1208 07/06/20 1714 07/06/20 2202 07/07/20 0820 07/07/20 1207  GLUCAP 141* 269* 186* 204* 167*    Recent Results (from the past 240 hour(s))  Resp Panel by RT-PCR (Flu A&B,  Covid) Nasopharyngeal Swab     Status: None   Collection Time: 07/04/20  9:16 PM   Specimen: Nasopharyngeal Swab; Nasopharyngeal(NP) swabs in vial transport medium  Result Value Ref Range Status   SARS Coronavirus 2 by RT PCR NEGATIVE NEGATIVE Final    Comment: (NOTE) SARS-CoV-2 target nucleic acids are NOT DETECTED.  The SARS-CoV-2 RNA is generally detectable in upper respiratory specimens during the acute phase of infection. The lowest concentration of SARS-CoV-2 viral copies this assay can detect is 138 copies/mL. A negative result does not preclude SARS-Cov-2 infection and should not be used as the sole basis for treatment or other patient management decisions. A negative result may occur with  improper specimen collection/handling, submission of specimen other than nasopharyngeal swab, presence of viral mutation(s) within the areas targeted by this assay,  and inadequate number of viral copies(<138 copies/mL). A negative result must be combined with clinical observations, patient history, and epidemiological information. The expected result is Negative.  Fact Sheet for Patients:  EntrepreneurPulse.com.au  Fact Sheet for Healthcare Providers:  IncredibleEmployment.be  This test is no t yet approved or cleared by the [place] FDA and  has been authorized for detection and/or diagnosis of SARS-CoV-2 by FDA under an Emergency Use Authorization (EUA). This EUA will remain  in effect (meaning this test can be used) for the duration of the COVID-19 declaration under Section 564(b)(1) of the Act, 21 U.S.C.section 360bbb-3(b)(1), unless the authorization is terminated  or revoked sooner.       Influenza A by PCR NEGATIVE NEGATIVE Final   Influenza B by PCR NEGATIVE NEGATIVE Final    Comment: (NOTE) The Xpert Xpress SARS-CoV-2/FLU/RSV plus assay is intended as an aid in the diagnosis of influenza from Nasopharyngeal swab specimens and should not be used as a sole basis for treatment. Nasal washings and aspirates are unacceptable for Xpert Xpress SARS-CoV-2/FLU/RSV testing.  Fact Sheet for Patients: EntrepreneurPulse.com.au  Fact Sheet for Healthcare Providers: IncredibleEmployment.be  This test is not yet approved or cleared by the [place] FDA and has been authorized for detection and/or diagnosis of SARS-CoV-2 by FDA under an Emergency Use Authorization (EUA). This EUA will remain in effect (meaning this test can be used) for the duration of the COVID-19 declaration under Section 564(b)(1) of the Act, 21 U.S.C. section 360bbb-3(b)(1), unless the authorization is terminated or revoked.  Performed at [facility], [address] [place], [place] [address]   Urine Culture     Status: Abnormal   Collection Time: 07/04/20  9:16 PM   Specimen:  Urine, Random  Result Value Ref Range Status   Specimen Description   Final    URINE, RANDOM Performed at [facility], [address] [place], [name] [address]    Special Requests   Final    NONE Performed at [facility], [address] [place],  [address]    Culture MULTIPLE SPECIES PRESENT, SUGGEST RECOLLECTION (A)  Final   Report Status 07/06/2020 FINAL  Final     Studies: DG Shoulder Left  Result Date: 07/06/2020 CLINICAL DATA:  Left shoulder pain without known injury. EXAM: LEFT SHOULDER - 2+ VIEW COMPARISON:  None. FINDINGS: There is no evidence of fracture or dislocation. There is no evidence of arthropathy or other focal bone abnormality. Soft tissues are unremarkable. IMPRESSION: Negative. Electronically Signed   By: Dorise Bullion III M.D   On: 07/06/2020 14:58    Scheduled Meds: . albuterol  2.5 mg Nebulization BID  .  amitriptyline  20 mg Oral QHS  . aspirin EC  81 mg Oral Daily  . atorvastatin  80 mg Oral Daily  . colchicine  0.6 mg Oral Daily  . enoxaparin (LOVENOX) injection  40 mg Subcutaneous Q24H  . furosemide  40 mg Oral BID  . insulin aspart  0-15 Units Subcutaneous TID WC  . insulin aspart  0-5 Units Subcutaneous QHS  . leflunomide  10 mg Oral Daily  . mesalamine  500 mg Oral BID  . methylPREDNISolone (SOLU-MEDROL) injection  40 mg Intravenous Daily  . [START ON 07/08/2020] metoprolol succinate  75 mg Oral Daily  . pantoprazole  40 mg Oral Daily  . sacubitril-valsartan  1 tablet Oral BID  . sodium chloride flush  3 mL Intravenous Q12H  . thiamine  100 mg Oral Daily  . umeclidinium-vilanterol  1 puff Inhalation Daily   Continuous Infusions: . sodium chloride      Assessment/Plan:  1. Acute on chronic systolic congestive heart failure.  Continuing oral Lasix today.  Patient on metoprolol ER and Entresto. 2. COPD exacerbation.  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  Continue Solu-Medrol today.   Continue inhalers and nebulizers. 3. SVT Short episodes.  Likely brought on with coughing.  Increase Toprol-XL. 4. Acute gout.  Acute pain left ankle and on the right foot.  Pain is better today than yesterday.  Continue colchicine and Solu-Medrol.  Uric acid level high and likely will need allopurinol started in a few days. 5. Left shoulder pain and decreased range of motion.  Improved a little bit with systemic steroids.  Shoulder x-ray negative.  Appreciate orthopedic evaluation likely impingement syndrome.  Orthopedic evaluation could not rule out rotator cuff tear.  Patient declined steroid injection or MRI at this point.  Continue PT and OT evaluations. 6. Acute kidney injury on chronic kidney disease stage II.  Creatinine peaked at 1.42 and is down to 1.02 today. 7. Type 2 diabetes mellitus with hyperlipidemia.  Hemoglobin A1c elevated at 7.5.  Continue diet control.  Patient on sliding scale insulin.  Continue Lipitor. 8. Crohn's disease and rheumatoid arthritis.  Continue leflunomide and chronic prednisone changed over to Solu-Medrol for now. 9. Weakness.  Physical therapy recommending rehab. 10. Hypokalemia replaced and now hyperkalemia.  Hold potassium supplementation today.  Recheck BMP tomorrow      Code Status:     Code Status Orders  (From admission, onward)         Start     Ordered   07/04/20 2325  Full code  Continuous        07/04/20 2327        Code Status History    Date Active Date Inactive Code Status Order ID Comments User Context   03/08/2020 1913 03/11/2020 1924 Full Code [id number]  Ivor Costa, MD ED   03/08/2020 1103 03/08/2020 1913 Partial Code [id number]  Ivor Costa, MD ED   11/22/2019 2135 11/26/2019 1818 Full Code [id number]  Athena Masse, MD ED   08/25/2019 2102 08/27/2019 1931 Partial Code [id number]  Ivor Costa, MD Inpatient   08/25/2019 1729 08/25/2019 2102 Partial Code [id number]  Ivor Costa, MD ED   06/17/2019 1809 06/21/2019 2316 DNR [id number]  Fritzi Mandes, MD ED   10/20/2018 1500 10/23/2018 1317 DNR [id number]  Hillary Bow, MD ED   04/22/2016 0945 04/24/2016 1909 Full Code [id number]  Bettey Costa, MD Inpatient   04/22/2016 0932 04/22/2016 0945 DNR [id number]  Bettey Costa, MD Inpatient  04/17/2016 1853 04/20/2016 1352 Full Code [id number]  Vaughan Basta, MD Inpatient   04/19/2015 2024 04/21/2015 1714 Full Code [id number]  Dustin Flock, MD Inpatient   11/02/2014 1202 11/08/2014 1741 Full Code [id number]  Bettey Costa, MD Inpatient   Advance Care Planning Activity    Advance Directive Documentation   Flowsheet Row Most Recent Value  Type of Advance Directive Living will  Pre-existing out of facility DNR order (yellow form or pink MOST form) --  "MOST" Form in Place? --     Family Communication: Spoke with son on the phone Disposition Plan: Status is: Inpatient  Dispo: The patient is from: Home              Anticipated d/c is to: Rehab versus home with home health depending on progress              Patient currently not ready for disposition yet.  Still on IV Solu-Medrol.   Difficult to place patient.  Hopefully not.  Consultants:  Orthopedic surgery  Time spent: 28 minutes  [place]

## 2020-07-07 NOTE — Progress Notes (Addendum)
Patient daughter Elson Areas) requested to have call from Dr. Leslye Peer regarding updates. Provider notified via secure chat. Her daughter phone number is [phone].   Per provider, her son is getting daily updates. Will notify the patient that her daughter can communicate with the brother.

## 2020-07-08 ENCOUNTER — Inpatient Hospital Stay: Payer: Medicare Other

## 2020-07-08 DIAGNOSIS — M109 Gout, unspecified: Secondary | ICD-10-CM | POA: Diagnosis not present

## 2020-07-08 DIAGNOSIS — J441 Chronic obstructive pulmonary disease with (acute) exacerbation: Secondary | ICD-10-CM | POA: Diagnosis not present

## 2020-07-08 DIAGNOSIS — I5023 Acute on chronic systolic (congestive) heart failure: Secondary | ICD-10-CM | POA: Diagnosis not present

## 2020-07-08 DIAGNOSIS — I471 Supraventricular tachycardia: Secondary | ICD-10-CM

## 2020-07-08 LAB — BASIC METABOLIC PANEL
Anion gap: 12 (ref 5–15)
BUN: 41 mg/dL — ABNORMAL HIGH (ref 8–23)
CO2: 25 mmol/L (ref 22–32)
Calcium: 7.7 mg/dL — ABNORMAL LOW (ref 8.9–10.3)
Chloride: 102 mmol/L (ref 98–111)
Creatinine, Ser: 1.16 mg/dL — ABNORMAL HIGH (ref 0.44–1.00)
GFR, Estimated: 46 mL/min — ABNORMAL LOW (ref >60)
Glucose, Bld: 143 mg/dL — ABNORMAL HIGH (ref 70–99)
Potassium: 4.7 mmol/L (ref 3.5–5.1)
Sodium: 139 mmol/L (ref 135–145)

## 2020-07-08 LAB — GLUCOSE, CAPILLARY
Glucose-Capillary: 128 mg/dL — ABNORMAL HIGH (ref 70–99)
Glucose-Capillary: 256 mg/dL — ABNORMAL HIGH (ref 70–99)
Glucose-Capillary: 280 mg/dL — ABNORMAL HIGH (ref 70–99)
Glucose-Capillary: 174 mg/dL — ABNORMAL HIGH (ref 70–99)

## 2020-07-08 LAB — SARS CORONAVIRUS 2 (TAT 6-24 HRS): SARS Coronavirus 2: NEGATIVE

## 2020-07-08 MED ORDER — BUPIVACAINE HCL (PF) 0.5 % IJ SOLN
10.0000 mL | Freq: Once | INTRAMUSCULAR | Status: DC
  Filled 2020-07-08: qty 10

## 2020-07-08 MED ORDER — FUROSEMIDE 40 MG PO TABS: 40.0000 mg | ORAL_TABLET | Freq: Two times a day (BID) | ORAL | Status: DC

## 2020-07-08 MED ORDER — PREDNISONE 20 MG PO TABS
40.0000 mg | ORAL_TABLET | Freq: Every day | ORAL | Status: DC
  Administered 2020-07-09: 40 mg via ORAL
  Filled 2020-07-08: qty 2

## 2020-07-08 MED ORDER — PREDNISONE 20 MG PO TABS
40.0000 mg | ORAL_TABLET | Freq: Once | ORAL | Status: AC
  Administered 2020-07-08: 40 mg via ORAL
  Filled 2020-07-08: qty 2

## 2020-07-08 MED ORDER — TRAZODONE HCL 50 MG PO TABS
50.0000 mg | ORAL_TABLET | Freq: Every day | ORAL | Status: DC
  Administered 2020-07-08: 50 mg via ORAL
  Filled 2020-07-08: qty 1

## 2020-07-08 MED ORDER — TRIAMCINOLONE ACETONIDE 40 MG/ML IJ SUSP
40.0000 mg | Freq: Once | INTRAMUSCULAR | Status: DC
  Filled 2020-07-08: qty 1

## 2020-07-08 NOTE — Progress Notes (Signed)
Physical Therapy Treatment Patient Details Name: Raven [name] MRN: [id number] DOB: Jul 18, 1934 Today's Date: 07/08/2020    History of Present Illness Pt is an 86 y.o. female with medical history significant for CAD, systolic CHF, last EF 45 to 50% on 03/09/2020, steroid-dependent COPD on chronic O2 at 1 to 2 L, Crohn's disease and rheumatoid arthritis, last hospitalized for CHF exacerbation in January 2022 who presents to the emergency room with nausea, dizziness.  MD assessment includes: Acute on chronic systolic congestive heart failure, Hypomagnesemia, AKI, dizziness, and weakness.    PT Comments    Pt stated she has been up today and walking to bathroom with staff.  Agrees to session.  Bed mobility without assist today.  Stood with min guard and is able to complete standing ex x 10 with walker support.  Sit to stand 2 x 5 reps fatigued and needed seated rest.  Gait in room. Is given RW but asks to try without it as she does not use one at baseline.  She walks about 30' in room with slow cautious gait with decreasing quality as she fatigues.  She does seem to have good awareness when quality decreases and self initiates sitting.    Pt lived alone and has minimal support.  She continues to demonstrate overall strength and balance deficits limiting her ability to care safely for herself.  She did not use a walker at baseline but it is required for safe gait at this time.  She has difficulty standing from lower surfaces.  Stated she has had progressive weakness for several weeks now.  "I am my own worst enemy" in regards to overdoing activity.  SNF remains appropriate to increase overall functional strength and independence for a successful discharge home.   Follow Up Recommendations  SNF;Supervision/Assistance - 24 hour     Equipment Recommendations  None recommended by PT    Recommendations for Other Services       Precautions / Restrictions Precautions Precautions:  Fall Restrictions Weight Bearing Restrictions: No    Mobility  Bed Mobility Overal bed mobility: Modified Independent                  Transfers Overall transfer level: Needs assistance Equipment used: Rolling walker (2 wheeled);None Transfers: Sit to/from Stand Sit to Stand: Min guard;From elevated surface         General transfer comment: bed elevated to simulate home bed and commode.  Ambulation/Gait Ambulation/Gait assistance: Min guard Gait Distance (Feet): 40 Feet Assistive device: None Gait Pattern/deviations: Decreased step length - right;Step-through pattern;Decreased step length - left Gait velocity: decreased   General Gait Details: slwo and generally unsteady but pt wanted to try without AD   Stairs             Wheelchair Mobility    Modified Rankin (Stroke Patients Only)       Balance Overall balance assessment: Needs assistance Sitting-balance support: Feet supported Sitting balance-Leahy Scale: Good     Standing balance support: No upper extremity supported Standing balance-Leahy Scale: Fair                              Cognition Arousal/Alertness: Awake/alert Behavior During Therapy: WFL for tasks assessed/performed Overall Cognitive Status: Within Functional Limits for tasks assessed  Exercises Other Exercises Other Exercises: standing BLE ex with walker support x 10, sit to stand 2 x 5 with emphasis on hand placement    General Comments        Pertinent Vitals/Pain Pain Assessment: Faces Faces Pain Scale: Hurts little more Pain Location: L shoulder and hips after gait Pain Descriptors / Indicators: Aching;Sore Pain Intervention(s): Limited activity within patient's tolerance;Monitored during session    Home Living                      Prior Function            PT Goals (current goals can now be found in the care plan section) Progress  towards PT goals: Progressing toward goals    Frequency    Min 2X/week      PT Plan Current plan remains appropriate    Co-evaluation              AM-PAC PT "6 Clicks" Mobility   Outcome Measure  Help needed turning from your back to your side while in a flat bed without using bedrails?: None Help needed moving from lying on your back to sitting on the side of a flat bed without using bedrails?: None Help needed moving to and from a bed to a chair (including a wheelchair)?: A Little Help needed standing up from a chair using your arms (e.g., wheelchair or bedside chair)?: A Little Help needed to walk in hospital room?: A Little Help needed climbing 3-5 steps with a railing? : A Lot 6 Click Score: 19    End of Session Equipment Utilized During Treatment: Gait belt;Oxygen Activity Tolerance: Patient tolerated treatment well Patient left: in bed;with call bell/phone within reach;Other (comment) Nurse Communication: Mobility status PT Visit Diagnosis: Unsteadiness on feet (R26.81);Difficulty in walking, not elsewhere classified (R26.2);Muscle weakness (generalized) (M62.81)     Time: 1546-1600 PT Time Calculation (min) (ACUTE ONLY): 14 min  Charges:  $Gait Training: 8-22 mins                    Chesley Noon, PTA 07/08/20, 4:13 PM

## 2020-07-08 NOTE — Care Management Important Message (Signed)
Important Message  Patient Details  Name: TAIWAN MILLON MRN: [id number] Date of Birth: 10-Apr-1934   Medicare Important Message Given:  Yes     Dannette Barbara 07/08/2020, 11:58 AM

## 2020-07-08 NOTE — Progress Notes (Signed)
   Heart Failure Nurse Navigator Note.  HFmrEF 45-50%  She was to discharged to [facility].  Attending Dr. has decided to further testing before discharge.  Wanted to touch base with the patient and her son to see if they had any further questions, concerns about monitoring her heart failure.  Discussed salt and salt substitute.  Son stated  for the most part he felt that his mother used the salt substitute but she again states that "she is going to use salt on her baked potato."  Will continue to follow until discharge.  Pricilla Riffle RN [facility]

## 2020-07-08 NOTE — Progress Notes (Signed)
Patient ID: Raven [name], female   DOB: 03-26-1934, 86 y.o.   MRN: [id number] [facility] PROGRESS NOTE  ANAE HAMS ALP:[id number] DOB: 1934-10-09 DOA: 07/04/2020 PCP: Baxter Hire, MD  HPI/Subjective: Patient had a rough night a lot of coughing all night.  Still feeling a little short of breath.  Joint pains lower extremities better.  Left shoulder pain still present.  Initially admitted with acute on chronic systolic congestive heart failure.  Objective: Vitals:   07/08/20 0820 07/08/20 1220  BP: 114/80 (!) 146/78  Pulse: 83 86  Resp: 16 16  Temp: 98.6 F (37 C) 98.5 F (36.9 C)  SpO2: (!) 86% (!) 88%    Intake/Output Summary (Last 24 hours) at 07/08/2020 1330 Last data filed at 07/08/2020 1010 Gross per 24 hour  Intake 240 ml  Output --  Net 240 ml   Filed Weights   07/04/20 1905 07/05/20 1836 07/08/20 0440  Weight: 79.4 kg 78 kg 81.2 kg    ROS: Review of Systems  Respiratory: Positive for cough and shortness of breath.   Cardiovascular: Negative for chest pain.  Gastrointestinal: Negative for abdominal pain, nausea and vomiting.  Musculoskeletal: Positive for joint pain.   Exam: Physical Exam HENT:     Head: Normocephalic.     Mouth/Throat:     Pharynx: No oropharyngeal exudate.  Eyes:     General: Lids are normal.     Conjunctiva/sclera: Conjunctivae normal.  Cardiovascular:     Rate and Rhythm: Normal rate and regular rhythm.     Heart sounds: Normal heart sounds, S1 normal and S2 normal.  Pulmonary:     Breath sounds: Examination of the right-lower field reveals decreased breath sounds. Examination of the left-lower field reveals decreased breath sounds and wheezing. Decreased breath sounds and wheezing present. No rhonchi or rales.  Abdominal:     Palpations: Abdomen is soft.     Tenderness: There is no abdominal tenderness.  Musculoskeletal:     Left shoulder: Tenderness present. Decreased range of motion.     Right ankle: Swelling  present.     Left ankle: Swelling present.  Skin:    General: Skin is warm.     Findings: No rash.  Neurological:     Mental Status: She is alert and oriented to person, place, and time.       Data Reviewed: Basic Metabolic Panel: Recent Labs  Lab 07/04/20 1925 07/04/20 2355 07/05/20 0415 07/06/20 0529 07/07/20 0518 07/08/20 0525  NA 141  --  144 142 144 139  K 3.5  --  3.1* 3.1* 5.0 4.7  CL 108  --  108 106 105 102  CO2 21*  --  27 25 28 25   GLUCOSE 166*  --  100* 99 170* 143*  BUN 41*  --  38* 28* 26* 41*  CREATININE 1.32* 1.42* 1.22* 1.11* 1.02* 1.16*  CALCIUM 8.1*  --  7.5* 7.5* 7.7* 7.7*  MG 1.2*  --  1.8  --   --   --    CBC: Recent Labs  Lab 07/04/20 1925 07/04/20 2355  WBC 6.6 7.6  HGB 12.5 13.2  HCT 38.8 41.0  MCV 90.4 91.5  PLT 200 196  BNP (last 3 results) Recent Labs    03/08/20 0602 05/23/20 1208 07/04/20 1925  BNP 501.8* 4,388.6* >4,500.0*    CBG: Recent Labs  Lab 07/07/20 1207 07/07/20 1611 07/07/20 2045 07/08/20 0820 07/08/20 1219  GLUCAP 167* 177* 169* 128* 256*  Recent Results (from the past 240 hour(s))  Resp Panel by RT-PCR (Flu A&B, Covid) Nasopharyngeal Swab     Status: None   Collection Time: 07/04/20  9:16 PM   Specimen: Nasopharyngeal Swab; Nasopharyngeal(NP) swabs in vial transport medium  Result Value Ref Range Status   SARS Coronavirus 2 by RT PCR NEGATIVE NEGATIVE Final    Comment: (NOTE) SARS-CoV-2 target nucleic acids are NOT DETECTED.  The SARS-CoV-2 RNA is generally detectable in upper respiratory specimens during the acute phase of infection. The lowest concentration of SARS-CoV-2 viral copies this assay can detect is 138 copies/mL. A negative result does not preclude SARS-Cov-2 infection and should not be used as the sole basis for treatment or other patient management decisions. A negative result may occur with  improper specimen collection/handling, submission of specimen other than nasopharyngeal  swab, presence of viral mutation(s) within the areas targeted by this assay, and inadequate number of viral copies(<138 copies/mL). A negative result must be combined with clinical observations, patient history, and epidemiological information. The expected result is Negative.  Fact Sheet for Patients:  EntrepreneurPulse.com.au  Fact Sheet for Healthcare Providers:  IncredibleEmployment.be  This test is no t yet approved or cleared by the [place] FDA and  has been authorized for detection and/or diagnosis of SARS-CoV-2 by FDA under an Emergency Use Authorization (EUA). This EUA will remain  in effect (meaning this test can be used) for the duration of the COVID-19 declaration under Section 564(b)(1) of the Act, 21 U.S.C.section 360bbb-3(b)(1), unless the authorization is terminated  or revoked sooner.       Influenza A by PCR NEGATIVE NEGATIVE Final   Influenza B by PCR NEGATIVE NEGATIVE Final    Comment: (NOTE) The Xpert Xpress SARS-CoV-2/FLU/RSV plus assay is intended as an aid in the diagnosis of influenza from Nasopharyngeal swab specimens and should not be used as a sole basis for treatment. Nasal washings and aspirates are unacceptable for Xpert Xpress SARS-CoV-2/FLU/RSV testing.  Fact Sheet for Patients: EntrepreneurPulse.com.au  Fact Sheet for Healthcare Providers: IncredibleEmployment.be  This test is not yet approved or cleared by the [place] FDA and has been authorized for detection and/or diagnosis of SARS-CoV-2 by FDA under an Emergency Use Authorization (EUA). This EUA will remain in effect (meaning this test can be used) for the duration of the COVID-19 declaration under Section 564(b)(1) of the Act, 21 U.S.C. section 360bbb-3(b)(1), unless the authorization is terminated or revoked.  Performed at [facility], [address], [place], [place] [address]   Urine  Culture     Status: Abnormal   Collection Time: 07/04/20  9:16 PM   Specimen: Urine, Random  Result Value Ref Range Status   Specimen Description   Final    URINE, RANDOM Performed at [facility], [address], [place], [place] [address]    Special Requests   Final    NONE Performed at [facility], [address] [place], [place] [address]    Culture MULTIPLE SPECIES PRESENT, SUGGEST RECOLLECTION (A)  Final   Report Status 07/06/2020 FINAL  Final     Studies: DG Shoulder Left  Result Date: 07/06/2020 CLINICAL DATA:  Left shoulder pain without known injury. EXAM: LEFT SHOULDER - 2+ VIEW COMPARISON:  None. FINDINGS: There is no evidence of fracture or dislocation. There is no evidence of arthropathy or other focal bone abnormality. Soft tissues are unremarkable. IMPRESSION: Negative. Electronically Signed   By: Dorise Bullion III M.D   On: 07/06/2020 14:58  Scheduled Meds: . albuterol  2.5 mg Nebulization BID  . amitriptyline  20 mg Oral QHS  . aspirin EC  81 mg Oral Daily  . atorvastatin  80 mg Oral Daily  . bupivacaine  10 mL Infiltration Once  . colchicine  0.6 mg Oral Daily  . enoxaparin (LOVENOX) injection  40 mg Subcutaneous Q24H  . [START ON 07/09/2020] furosemide  40 mg Oral BID  . insulin aspart  0-15 Units Subcutaneous TID WC  . insulin aspart  0-5 Units Subcutaneous QHS  . leflunomide  10 mg Oral Daily  . mesalamine  500 mg Oral BID  . metoprolol succinate  75 mg Oral Daily  . pantoprazole  40 mg Oral Daily  . [START ON 07/09/2020] predniSONE  40 mg Oral Q breakfast  . sacubitril-valsartan  1 tablet Oral BID  . sodium chloride flush  3 mL Intravenous Q12H  . thiamine  100 mg Oral Daily  . triamcinolone acetonide  40 mg Other Once  . umeclidinium-vilanterol  1 puff Inhalation Daily   Continuous Infusions: . sodium chloride      Assessment/Plan:  1. COPD exacerbation.  Patient's IV blew this morning and switched the Solu-Medrol over to  prednisone.  Continue inhalers and nebulizers.  Patient had a rough night with coughing last night.  Repeat chest x-ray shows chronically elevated right hemidiaphragm.  We will get a CT scan of the chest for further evaluation but will have to do without contrast with recent acute kidney injury.  We will also get a VQ scan tomorrow morning to rule out PE.  We will also send a D-dimer in the morning. 2. Acute on chronic systolic congestive heart failure.  We will hold oral Lasix this evening and restart tomorrow morning.  Patient also had metoprolol ER and Entresto. 3. SVT Short episodes likely brought on with coughing.  On increase Toprol-XL. 4. Acute gout.  Acute pain left ankle and bottom of the right foot.  Pain is improving on a daily basis with systemic steroids and colchicine.  Uric acid high and will likely need allopurinol as outpatient. 5. Left shoulder pain and decreased range of motion, likely impingement syndrome.  Orthopedic surgery to do steroid injection.  Continue PT and OT evaluations. 6. Acute kidney injury on chronic kidney disease stage II.  Creatinine peaked at 1442 and down to 1.02 yesterday.  Creatinine up at 1.16 now.  Hold evening Lasix dose. 7. Type 2 diabetes mellitus with hyperlipidemia.  Hemoglobin A1c slightly elevated at 7.5.  Continue diet control.  Continue Lipitor. 8. Crohn's disease and rheumatoid arthritis.  Continue leflunomide and chronic prednisone. 9. Weakness.  Physical therapy recommends rehab 10. Hypokalemia replaced        Code Status:     Code Status Orders  (From admission, onward)         Start     Ordered   07/04/20 2325  Full code  Continuous        07/04/20 2327        Code Status History    Date Active Date Inactive Code Status Order ID Comments User Context   03/08/2020 1913 03/11/2020 1924 Full Code [id number]  Ivor Costa, MD ED   03/08/2020 1103 03/08/2020 1913 Partial Code [id number]  Ivor Costa, MD ED   11/22/2019 2135 11/26/2019 1818  Full Code [id number]  Athena Masse, MD ED   08/25/2019 2102 08/27/2019 1931 Partial Code [id number]  Ivor Costa, MD Inpatient   08/25/2019 1729 08/25/2019 2102  Partial Code [id number]  Ivor Costa, MD ED   06/17/2019 1809 06/21/2019 2316 DNR [id number]  Fritzi Mandes, MD ED   10/20/2018 1500 10/23/2018 1317 DNR [id number]  Hillary Bow, MD ED   04/22/2016 0945 04/24/2016 1909 Full Code [id number]  Bettey Costa, MD Inpatient   04/22/2016 0932 04/22/2016 0945 DNR [id number]  Bettey Costa, MD Inpatient   04/17/2016 1853 04/20/2016 1352 Full Code [id number]  Vaughan Basta, MD Inpatient   04/19/2015 2024 04/21/2015 1714 Full Code [id number]  Dustin Flock, MD Inpatient   11/02/2014 1202 11/08/2014 1741 Full Code [id number]  Bettey Costa, MD Inpatient   Advance Care Planning Activity    Advance Directive Documentation   Flowsheet Row Most Recent Value  Type of Advance Directive Living will  Pre-existing out of facility DNR order (yellow form or pink MOST form) --  "MOST" Form in Place? --     Family Communication: Spoke with son at the bedside and spoke with daughter on the phone Disposition Plan: Status is: Inpatient  Dispo: The patient is from: Home              Anticipated d/c is to: Rehab              Patient currently had a rough night last night with regards to breathing and coughing.  We will watch again overnight and reassess tomorrow.  We will get a VQ scan to rule out PE.   Difficult to place patient.  No.  Time spent: 29 minutes  [place]

## 2020-07-08 NOTE — Plan of Care (Signed)

## 2020-07-08 NOTE — TOC Progression Note (Signed)
Transition of Care [facility]) - Progression Note    Patient Details  Name: Raven [name] MRN: [id number] Date of Birth: 25-Mar-1934  Transition of Care [facility]) CM/SW [place], RN Phone Number: 07/08/2020, 3:35 PM  Clinical Narrative:  Spoke with patient about bed acceptance at [facility], family at bedside also receptive. Per Attending patient will be discharged tomorrow, 07/09/20, [facility], [place] aware.     Expected Discharge Plan: Skilled Nursing Facility Barriers to Discharge: Continued Medical Work up  Expected Discharge Plan and Services Expected Discharge Plan: [place]   Discharge Planning Services: CM Consult   Living arrangements for the past 2 months: Single Family Home                                       Social Determinants of Health (SDOH) Interventions    Readmission Risk Interventions Readmission Risk Prevention Plan 03/11/2020 11/25/2019  Transportation Screening Complete Complete  Medication Review Press photographer) Complete Complete  PCP or Specialist appointment within 3-5 days of discharge Complete Complete  HRI or Home Care Consult Complete Complete  SW Recovery Care/Counseling Consult Complete Complete  Palliative Care Screening Not Applicable Not [place] Complete Not Applicable  Some recent data might be hidden

## 2020-07-08 NOTE — Progress Notes (Addendum)
Subjective: The patient notes increased left shoulder pain this morning, but denies any further injury to the shoulder.  She also notes that she did not sleep well last night.   Objective: Vital signs in last 24 hours: Temp:  [97.5 F (36.4 C)-98.9 F (37.2 C)] 97.7 F (36.5 C) (05/02 0423) Pulse Rate:  [73-90] 78 (05/02 0423) Resp:  [16-18] 18 (05/02 0423) BP: (98-130)/(57-94) 126/57 (05/02 0423) SpO2:  [91 %-98 %] 98 % (05/02 0423) Weight:  [81.2 kg] 81.2 kg (05/02 0440)  Intake/Output from previous day: No intake/output data recorded. Intake/Output this shift: No intake/output data recorded.  No results for input(s): HGB in the last 72 hours. No results for input(s): WBC, RBC, HCT, PLT in the last 72 hours. Recent Labs    07/07/20 0518 07/08/20 0525  NA 144 139  K 5.0 4.7  CL 105 102  CO2 28 25  BUN 26* 41*  CREATININE 1.02* 1.16*  GLUCOSE 170* 143*  CALCIUM 7.7* 7.7*   No results for input(s): LABPT, INR in the last 72 hours.  Physical Exam: Orthopedic examination of the left shoulder is essentially unchanged as compared to yesterday.  She does have increased pain with any attempted active or passive motion of the shoulder.  She remains neurovascularly intact to the left upper extremity and hand.  Assessment: Impingement/tendinopathy with probable rotator cuff tear, left shoulder.  Plan: The treatment options have been discussed with the patient.  The patient is agreeable to proceed with a steroid injection into the left shoulder today.  Therefore, I will order the appropriate medications and come back a little later to administer this injection.  Meanwhile, the patient may continue to receive what ever medical attention she needs today.   Marshall Cork Donnalynn Wheeless 07/08/2020, 7:53 AM    Addendum: After obtaining verbal consent, the left shoulder subacromial space was injected sterilely using 1 cc of Kenalog-40 (40 mg) and 4 cc of 0.5% plain Sensorcaine.  The patient  tolerated the procedure well.  Marshall Cork Dhyana Bastone 07/08/2020, 3:03 PM

## 2020-07-09 ENCOUNTER — Inpatient Hospital Stay: Payer: Medicare Other

## 2020-07-09 ENCOUNTER — Encounter: Payer: Self-pay | Admitting: Internal Medicine

## 2020-07-09 DIAGNOSIS — M109 Gout, unspecified: Secondary | ICD-10-CM | POA: Diagnosis not present

## 2020-07-09 DIAGNOSIS — I471 Supraventricular tachycardia: Secondary | ICD-10-CM | POA: Diagnosis not present

## 2020-07-09 DIAGNOSIS — J441 Chronic obstructive pulmonary disease with (acute) exacerbation: Secondary | ICD-10-CM | POA: Diagnosis not present

## 2020-07-09 DIAGNOSIS — J9621 Acute and chronic respiratory failure with hypoxia: Secondary | ICD-10-CM

## 2020-07-09 DIAGNOSIS — I5023 Acute on chronic systolic (congestive) heart failure: Secondary | ICD-10-CM | POA: Diagnosis not present

## 2020-07-09 LAB — HEPATITIS B CORE ANTIBODY, TOTAL: Hep B Core Total Ab: NONREACTIVE

## 2020-07-09 LAB — BASIC METABOLIC PANEL
Anion gap: 12 (ref 5–15)
BUN: 47 mg/dL — ABNORMAL HIGH (ref 8–23)
CO2: 23 mmol/L (ref 22–32)
Calcium: 7.8 mg/dL — ABNORMAL LOW (ref 8.9–10.3)
Chloride: 105 mmol/L (ref 98–111)
Creatinine, Ser: 1.12 mg/dL — ABNORMAL HIGH (ref 0.44–1.00)
GFR, Estimated: 48 mL/min — ABNORMAL LOW (ref >60)
Glucose, Bld: 153 mg/dL — ABNORMAL HIGH (ref 70–99)
Potassium: 4.9 mmol/L (ref 3.5–5.1)
Sodium: 140 mmol/L (ref 135–145)

## 2020-07-09 LAB — HEPATITIS C ANTIBODY: HCV Ab: NONREACTIVE

## 2020-07-09 LAB — D-DIMER, QUANTITATIVE: D-Dimer, Quant: 1.21 ug/mL-FEU — ABNORMAL HIGH (ref 0.00–0.50)

## 2020-07-09 LAB — GLUCOSE, CAPILLARY: Glucose-Capillary: 138 mg/dL — ABNORMAL HIGH (ref 70–99)

## 2020-07-09 LAB — HEPATITIS B SURFACE ANTIGEN: Hepatitis B Surface Ag: NONREACTIVE

## 2020-07-09 MED ORDER — PREDNISONE 5 MG PO TABS: ORAL_TABLET | ORAL | 0 refills | Status: DC

## 2020-07-09 MED ORDER — TECHNETIUM TO 99M ALBUMIN AGGREGATED
4.4100 | Freq: Once | INTRAVENOUS | Status: AC | PRN
  Administered 2020-07-09: 4.41 via INTRAVENOUS

## 2020-07-09 MED ORDER — ALLOPURINOL 100 MG PO TABS: 100.0000 mg | ORAL_TABLET | Freq: Every day | ORAL | 0 refills | Status: AC

## 2020-07-09 MED ORDER — TRAZODONE HCL 50 MG PO TABS: 50.0000 mg | ORAL_TABLET | Freq: Every evening | ORAL | 0 refills | Status: AC | PRN

## 2020-07-09 MED ORDER — TORSEMIDE 20 MG PO TABS: 20.0000 mg | ORAL_TABLET | Freq: Two times a day (BID) | ORAL | 0 refills | Status: AC

## 2020-07-09 MED ORDER — HYDROCOD POLST-CPM POLST ER 10-8 MG/5ML PO SUER: 5.0000 mL | Freq: Two times a day (BID) | ORAL | 0 refills | Status: AC | PRN

## 2020-07-09 MED ORDER — METOPROLOL SUCCINATE ER 25 MG PO TB24: 75.0000 mg | ORAL_TABLET | Freq: Every day | ORAL | 0 refills | Status: AC

## 2020-07-09 MED ORDER — COLCHICINE 0.6 MG PO TABS: 0.6000 mg | ORAL_TABLET | Freq: Every day | ORAL | 0 refills | Status: AC

## 2020-07-09 MED ORDER — TORSEMIDE 20 MG PO TABS
20.0000 mg | ORAL_TABLET | Freq: Two times a day (BID) | ORAL | Status: DC
  Administered 2020-07-09: 20 mg via ORAL
  Filled 2020-07-09: qty 1

## 2020-07-09 MED ORDER — IOHEXOL 350 MG/ML SOLN
75.0000 mL | Freq: Once | INTRAVENOUS | Status: AC | PRN
  Administered 2020-07-09: 75 mL via INTRAVENOUS

## 2020-07-09 NOTE — Progress Notes (Signed)
Subjective: The patient notes moderate improvement in her left shoulder symptoms since receiving the injection yesterday.  She feels that she can move her left shoulder more freely and with less discomfort.   She continues to be worked up for respiratory issues.  Objective: Vital signs in last 24 hours: Temp:  [98.1 F (36.7 C)-98.5 F (36.9 C)] 98.1 F (36.7 C) (05/03 0535) Pulse Rate:  [65-86] 85 (05/03 0748) Resp:  [16-18] 18 (05/03 0748) BP: (126-152)/(73-94) 152/75 (05/03 0748) SpO2:  [88 %-99 %] 97 % (05/03 0748) Weight:  [78.3 kg] 78.3 kg (05/03 0500)  Intake/Output from previous day: 05/02 0701 - 05/03 0700 In: 720 [P.O.:720] Out: -  Intake/Output this shift: Total I/O In: 360 [P.O.:360] Out: -   No results for input(s): HGB in the last 72 hours. No results for input(s): WBC, RBC, HCT, PLT in the last 72 hours. Recent Labs    07/08/20 0525 07/09/20 0319  NA 139 140  K 4.7 4.9  CL 102 105  CO2 25 23  BUN 41* 47*  CREATININE 1.16* 1.12*  GLUCOSE 143* 153*  CALCIUM 7.7* 7.8*   No results for input(s): LABPT, INR in the last 72 hours.  Physical Exam: Orthopedic examination again is limited to the left shoulder and upper extremity.  There is mild residual tenderness to palpation over the anterolateral aspect of the shoulder.  Skin inspection otherwise is unremarkable.  No swelling, erythema, ecchymosis, abrasions or other skin abnormalities are identified.  The patient appears to be moving her shoulder more freely and with less discomfort today.  She remains neurovascularly intact to the left upper extremity and hand.  Assessment: Impingement/tendinopathy with possible rotator cuff tear left shoulder, presently improved symptomatically.  Plan: The patient appears to have obtained moderate benefit from the left shoulder injection done yesterday.  She is encouraged to progress in her activities as symptoms permit as they pertain to her left shoulder, but is to avoid  offending activities.  She may take over-the-counter medications as needed for any residual discomfort.  Thank you for asking me to participate in the care of this most pleasant woman.  I am going to sign off at this time.  She may follow-up with either Dr. Jefm Bryant or myself as needed for her shoulder pain.  Please reconsult me if further orthopedic input is required during this hospitalization.   Raven [name] 07/09/2020, 10:34 AM

## 2020-07-09 NOTE — Discharge Summary (Signed)
[facility] [place] NAME: Raven [name]    MR#:  [id number]  DATE OF BIRTH:  March 13, 1934  DATE OF ADMISSION:  07/04/2020 ADMITTING PHYSICIAN: Athena Masse, MD  DATE OF DISCHARGE: 07/09/2020  PRIMARY CARE PHYSICIAN: Baxter Hire, MD    ADMISSION DIAGNOSIS:  Hypomagnesemia [E83.42] Troponin I above reference range [R77.8] Acute on chronic respiratory failure with hypoxia (HCC) [J96.21] Acute heart failure, unspecified heart failure type ([place]) [I50.9] Acute on chronic systolic (congestive) heart failure ([place]) [I50.23]  DISCHARGE DIAGNOSIS:  Principal Problem:   Acute on chronic systolic CHF (congestive heart failure) (HCC) Active Problems:   COPD with acute exacerbation (HCC)   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 Anemia   . Arthritis   . Asthma   . CHF (congestive heart failure) ([place])   . Complication of anesthesia   . COPD (chronic obstructive pulmonary disease) ([place])   . Crohn's disease ([place])   . Depression    after death of husband  . Hyperlipidemia   . Hypertension   . Myocardial infarction ([place])   . Osteopenia   . Peptic ulcer disease   . Pneumonia   . PONV (postoperative nausea and vomiting)     HOSPITAL COURSE:   1.  COPD exacerbation, chronic hypoxic respiratory failure.  SoluMedrol and switched over to oral prednisone.  Since she does take chronic prednisone at home we will do a slower taper.  Patient given  nebulizer treatments while here.  Continue normal inhaler and albuterol inhaler upon disposition.  The patient is chronically on 3 L of oxygen. 2.  Acute on chronic systolic congestive heart failure.  Daily weights.  Patient was initially diuresed with Lasix 40 mg IV twice daily.  Continue metoprolol ER and Entresto.  We will go back on torsemide 20 mg twice daily. 3.  SVT Short episodes likely brought on with coughing the other day.  I did increase Toprol-XL.  CT scan of the chest negative for pulmonary embolism.  Ultrasound of lower extremities negative for DVT. 4.  Acute gout.  This was brought on by IV Lasix during the hospital course.  Patient had acute pain left ankle and bottom of the right foot.  Pain is improving on a daily basis with systemic steroids and colchicine.  We will start low-dose allopurinol as outpatient. 5.  Left shoulder pain and decreased range of motion.  Seen by orthopedics Dr. Roland Rack who did a steroid injection.  Continue PT and OT evaluations.  Likely this is an impingement syndrome. 6.  Acute kidney injury on chronic kidney disease stage II.  Creatinine peaked at 1.42.  Creatinine 1.12 upon disposition 7.  Type 2 diabetes mellitus with hyperlipidemia.  Hemoglobin A1c slightly elevated at 7.5.  Continue diet control.  Check fingersticks daily.  Continue Lipitor. 8.  Crohn's disease and rheumatoid arthritis.  Continue leflunomide and chronic prednisone.  Patient will be tapered down to a dose of  7.5 mg daily. 9.  Weakness.  Physical therapy recommends rehab 10.  Hypokalemia continue replacement on torsemide 11.  Possibility of cirrhosis send off hepatitis B and C profiles and AMA.  Follow-up with outpatient medical doctor to see if gastroenterology needed.  Check a BMP in 2 to 3 days  DISCHARGE CONDITIONS:   Satisfactory  CONSULTS OBTAINED:  Treatment Team:  Corky Mull, MD  DRUG ALLERGIES:   Allergies  Allergen Reactions  . Penicillins Other (See Comments)     Reaction:  Unknown  Has patient had a PCN reaction causing immediate rash, facial/tongue/throat swelling, SOB or lightheadedness with hypotension: unknown Has patient had a PCN reaction causing severe rash involving mucus membranes or skin necrosis: unknown Has patient had a PCN reaction that required hospitalization No Has patient had a PCN reaction occurring within the last 10 years: No If all of the above answers are "NO", then may proceed with Cephalosporin use.   . Augmentin [Amoxicillin-Pot Clavulanate] Other (See Comments)    Reaction:  Unknown   . Indocin [Indomethacin] Other (See Comments)    Reaction:  Unknown   . Lodine [Etodolac] Other (See Comments)    Reaction:   GI upset   . Methotrexate Derivatives Other (See Comments)    Reaction:  GI upset   . Gold Rash  . Zantac [Ranitidine Hcl] Rash    DISCHARGE MEDICATIONS:   Allergies as of 07/09/2020      Reactions   Penicillins Other (See Comments)   Reaction:  Unknown  Has patient had a PCN reaction causing immediate rash, facial/tongue/throat swelling, SOB or lightheadedness with hypotension: unknown Has patient had a PCN reaction causing severe rash involving mucus membranes or skin necrosis: unknown Has patient had a PCN reaction that required hospitalization No Has patient had a PCN reaction occurring within the last 10 years: No If all of the above answers are "NO", then may proceed with Cephalosporin use.   Augmentin [amoxicillin-pot Clavulanate] Other (See Comments)   Reaction:  Unknown    Indocin [indomethacin] Other (See Comments)   Reaction:  Unknown    Lodine [etodolac] Other (See Comments)   Reaction:   GI upset    Methotrexate Derivatives Other (See Comments)   Reaction:  GI upset    Gold Rash   Zantac [ranitidine Hcl] Rash      Medication List    TAKE these medications   acetaminophen 500 MG tablet Commonly known as: TYLENOL Take 500-1,000 mg by mouth every 6 (six) hours as needed for mild pain,  moderate pain or fever.   Adalimumab 40 MG/0.8ML Pskt Inject 40 mg into the skin every 30 (thirty) days.   albuterol 108 (90 Base) MCG/ACT inhaler Commonly known as: VENTOLIN HFA Inhale 2 puffs into the lungs every 6 (six) hours as needed for wheezing or shortness of breath.   allopurinol 100 MG tablet Commonly known as: Zyloprim Take 1 tablet (100 mg total) by mouth daily.   amitriptyline 10 MG tablet Commonly known as: ELAVIL Take 20 mg by mouth at bedtime.   Anoro Ellipta 62.5-25 MCG/INH Aepb Generic drug: umeclidinium-vilanterol Inhale 1 puff into the lungs daily.   aspirin EC 81 MG tablet Take 81 mg by mouth daily.   atorvastatin 80 MG tablet Commonly known as: LIPITOR Take 80 mg by mouth daily.   chlorpheniramine-HYDROcodone 10-8 MG/5ML Suer Commonly known as: TUSSIONEX Take 5 mLs by mouth every 12 (twelve) hours as needed (cough unrelieved with robitussen).   CITRACAL +D3 PO Take  1 tablet by mouth daily.   colchicine 0.6 MG tablet Take 1 tablet (0.6 mg total) by mouth daily. Start taking on: Jul 10, 2020   dextromethorphan-guaiFENesin 30-600 MG 12hr tablet Commonly known as: MUCINEX DM Take 1 tablet by mouth 2 (two) times daily as needed for cough.   Entresto 24-26 MG Generic drug: sacubitril-valsartan TAKE ONE TABLET BY MOUTH TWICE DAILY   esomeprazole 40 MG capsule Commonly known as: NEXIUM Take 40 mg by mouth daily.   folic acid 944 MCG tablet Commonly known as: FOLVITE Take 400 mcg by mouth daily.   leflunomide 10 MG tablet Commonly known as: ARAVA Take 10 mg by mouth daily.   mesalamine 500 MG CR capsule Commonly known as: PENTASA Take 500 mg by mouth 2 (two) times daily.   metoprolol succinate 25 MG 24 hr tablet Commonly known as: TOPROL-XL Take 3 tablets (75 mg total) by mouth daily. Take with or immediately following a meal. Start taking on: Jul 10, 2020 What changed:  medication strength how much to take additional instructions    multivitamin with minerals Tabs tablet Take 1 tablet by mouth daily.   ondansetron 4 MG disintegrating tablet Commonly known as: ZOFRAN-ODT Take 4 mg by mouth every 12 (twelve) hours as needed for nausea/vomiting.   potassium chloride 10 MEQ tablet Commonly known as: KLOR-CON Take 10 mEq by mouth daily.   predniSONE 5 MG tablet Commonly known as: DELTASONE 6 tabs po day1,2; 5 tabs po day3,4; 4 tabs po day 5,6; 3 tabs po day7,8; 2 tabs po day9,10; 1.5 tabs daily afterwards What changed:  how much to take how to take this when to take this additional instructions Another medication with the same name was removed. Continue taking this medication, and follow the directions you see here.   thiamine 100 MG tablet Commonly known as: Vitamin B-1 Take 100 mg by mouth daily.   torsemide 20 MG tablet Commonly known as: DEMADEX Take 1 tablet (20 mg total) by mouth 2 (two) times daily. What changed: how much to take   traZODone 50 MG tablet Commonly known as: DESYREL Take 1 tablet (50 mg total) by mouth at bedtime as needed for sleep.   Vitamin B12-Folic Acid 967-591 MCG Tabs Take 1 tablet by mouth as directed.   vitamin E 180 MG (400 UNITS) capsule Take 400 Units by mouth daily.        DISCHARGE INSTRUCTIONS:   Follow-up team at rehab 1 day  If you experience worsening of your admission symptoms, develop shortness of breath, life threatening emergency, suicidal or homicidal thoughts you must seek medical attention immediately by calling 911 or calling your MD immediately  if symptoms less severe.  You Must read complete instructions/literature along with all the possible adverse reactions/side effects for all the Medicines you take and that have been prescribed to you. Take any new Medicines after you have completely understood and accept all the possible adverse reactions/side effects.   Please note  You were cared for by a hospitalist during your hospital stay. If you  have any questions about your discharge medications or the care you received while you were in the hospital after you are discharged, you can call the unit and asked to speak with the hospitalist on call if the hospitalist that took care of you is not available. Once you are discharged, your primary care physician will handle any further medical issues. Please note that NO REFILLS for any discharge medications will be authorized once you  are discharged, as it is imperative that you return to your primary care physician (or establish a relationship with a primary care physician if you do not have one) for your aftercare needs so that they can reassess your need for medications and monitor your lab values.    Today   CHIEF COMPLAINT:   Chief Complaint  Patient presents with  . Dizziness  . Nausea    HISTORY OF PRESENT ILLNESS:  Raven [name]  is a 86 y.o. female came in with dizziness and nausea and shortness of breath   VITAL SIGNS:  Blood pressure (!) 152/75, pulse 85, temperature 98.1 F (36.7 C), resp. rate 18, height 5' 6"  (1.676 m), weight 78.3 kg, SpO2 97 %.  I/O:    Intake/Output Summary (Last 24 hours) at 07/09/2020 1231 Last data filed at 07/09/2020 0950 Gross per 24 hour  Intake 840 ml  Output --  Net 840 ml    PHYSICAL EXAMINATION:  GENERAL:  86 y.o.-year-old patient lying in the bed with no acute distress.  EYES: Pupils equal, round, reactive to light and accommodation. No scleral icterus. Extraocular muscles intact.  HEENT: Head atraumatic, normocephalic. Oropharynx and nasopharynx clear.   LUNGS: Decreased breath sounds bilaterally, no wheezing, rales,rhonchi or crepitation. No use of accessory muscles of respiration.  CARDIOVASCULAR: S1, S2 normal. No murmurs, rubs, or gallops.  ABDOMEN: Soft, non-tender, non-distended.  EXTREMITIES: Trace pedal edema.  Limited left shoulder movement secondary to pain.  Able to bend left ankle better than previous. NEUROLOGIC:  Cranial nerves II through XII are intact. Muscle strength 5/5 in all extremities. Sensation intact. Gait not checked.  PSYCHIATRIC: The patient is alert and oriented x 3.  SKIN: No obvious rash, lesion, or ulcer.   DATA REVIEW:   CBC Recent Labs  Lab 07/04/20 2355  WBC 7.6  HGB 13.2  HCT 41.0  PLT 196    Chemistries  Recent Labs  Lab 07/05/20 0415 07/06/20 0529 07/09/20 0319  NA 144   < > 140  K 3.1*   < > 4.9  CL 108   < > 105  CO2 27   < > 23  GLUCOSE 100*   < > 153*  BUN 38*   < > 47*  CREATININE 1.22*   < > 1.12*  CALCIUM 7.5*   < > 7.8*  MG 1.8  --   --    < > = values in this interval not displayed.    Microbiology Results  Results for orders placed or performed during the hospital encounter of 07/04/20  Resp Panel by RT-PCR (Flu A&B, Covid) Nasopharyngeal Swab     Status: None   Collection Time: 07/04/20  9:16 PM   Specimen: Nasopharyngeal Swab; Nasopharyngeal(NP) swabs in vial transport medium  Result Value Ref Range Status   SARS Coronavirus 2 by RT PCR NEGATIVE NEGATIVE Final    Comment: (NOTE) SARS-CoV-2 target nucleic acids are NOT DETECTED.  The SARS-CoV-2 RNA is generally detectable in upper respiratory specimens during the acute phase of infection. The lowest concentration of SARS-CoV-2 viral copies this assay can detect is 138 copies/mL. A negative result does not preclude SARS-Cov-2 infection and should not be used as the sole basis for treatment or other patient management decisions. A negative result may occur with  improper specimen collection/handling, submission of specimen other than nasopharyngeal swab, presence of viral mutation(s) within the areas targeted by this assay, and inadequate number of viral copies(<138 copies/mL). A negative result must be combined with  clinical observations, patient history, and epidemiological information. The expected result is Negative.  Fact Sheet for Patients:   EntrepreneurPulse.com.au  Fact Sheet for Healthcare Providers:  IncredibleEmployment.be  This test is no t yet approved or cleared by the [place] FDA and  has been authorized for detection and/or diagnosis of SARS-CoV-2 by FDA under an Emergency Use Authorization (EUA). This EUA will remain  in effect (meaning this test can be used) for the duration of the COVID-19 declaration under Section 564(b)(1) of the Act, 21 U.S.C.section 360bbb-3(b)(1), unless the authorization is terminated  or revoked sooner.       Influenza A by PCR NEGATIVE NEGATIVE Final   Influenza B by PCR NEGATIVE NEGATIVE Final    Comment: (NOTE) The Xpert Xpress SARS-CoV-2/FLU/RSV plus assay is intended as an aid in the diagnosis of influenza from Nasopharyngeal swab specimens and should not be used as a sole basis for treatment. Nasal washings and aspirates are unacceptable for Xpert Xpress SARS-CoV-2/FLU/RSV testing.  Fact Sheet for Patients: EntrepreneurPulse.com.au  Fact Sheet for Healthcare Providers: IncredibleEmployment.be  This test is not yet approved or cleared by the [place] FDA and has been authorized for detection and/or diagnosis of SARS-CoV-2 by FDA under an Emergency Use Authorization (EUA). This EUA will remain in effect (meaning this test can be used) for the duration of the COVID-19 declaration under Section 564(b)(1) of the Act, 21 U.S.C. section 360bbb-3(b)(1), unless the authorization is terminated or revoked.  Performed at [facility], [address], [place], [place] [address]   Urine Culture     Status: Abnormal   Collection Time: 07/04/20  9:16 PM   Specimen: Urine, Random  Result Value Ref Range Status   Specimen Description   Final    URINE, RANDOM Performed at [facility], [address], [place], [place] [address]    Special Requests   Final    NONE Performed at  [facility], [address] [place], [place] [address]    Culture MULTIPLE SPECIES PRESENT, SUGGEST RECOLLECTION (A)  Final   Report Status 07/06/2020 FINAL  Final  SARS CORONAVIRUS 2 (TAT 6-24 HRS) Nasopharyngeal Nasopharyngeal Swab     Status: None   Collection Time: 07/08/20 12:56 PM   Specimen: Nasopharyngeal Swab  Result Value Ref Range Status   SARS Coronavirus 2 NEGATIVE NEGATIVE Final    Comment: (NOTE) SARS-CoV-2 target nucleic acids are NOT DETECTED.  The SARS-CoV-2 RNA is generally detectable in upper and lower respiratory specimens during the acute phase of infection. Negative results do not preclude SARS-CoV-2 infection, do not rule out co-infections with other pathogens, and should not be used as the sole basis for treatment or other patient management decisions. Negative results must be combined with clinical observations, patient history, and epidemiological information. The expected result is Negative.  Fact Sheet for Patients: SugarRoll.be  Fact Sheet for Healthcare Providers: https://www.woods-mathews.com/  This test is not yet approved or cleared by the [place] FDA and  has been authorized for detection and/or diagnosis of SARS-CoV-2 by FDA under an Emergency Use Authorization (EUA). This EUA will remain  in effect (meaning this test can be used) for the duration of the COVID-19 declaration under Se ction 564(b)(1) of the Act, 21 U.S.C. section 360bbb-3(b)(1), unless the authorization is terminated or revoked sooner.  Performed at [facility], [place] [address], [place],  [address]     RADIOLOGY:  DG Chest 2 View  Result Date: 07/08/2020 CLINICAL DATA:  Shortness  of breath EXAM: CHEST - 2 VIEW COMPARISON:  07/04/2020, CT chest, 10/21/2018 FINDINGS: Unchanged examination with cardiomegaly, mild, diffuse interstitial pulmonary opacity, and elevation of the right hemidiaphragm with  associated atelectasis or scarring. No new or focal airspace opacity. Disc degenerative disease of the thoracic spine IMPRESSION: Unchanged examination with cardiomegaly, mild, diffuse interstitial pulmonary opacity, consistent with pulmonary edema, and elevation of the right hemidiaphragm with associated atelectasis or scarring. No new or focal airspace opacity. Electronically Signed   By: Eddie Candle M.D.   On: 07/08/2020 14:34   CT CHEST WO CONTRAST  Result Date: 07/08/2020 CLINICAL DATA:  Persistent cough, short of breath EXAM: CT CHEST WITHOUT CONTRAST TECHNIQUE: Multidetector CT imaging of the chest was performed following the standard protocol without IV contrast. COMPARISON:  07/08/2020, 10/21/2018 FINDINGS: Cardiovascular: Unenhanced imaging of the heart and great vessels demonstrate mild cardiomegaly. No pericardial effusion. Normal caliber of the thoracic aorta. Extensive atherosclerosis of the aorta and coronary vessels. Evaluation of the vascular lumen is limited without IV contrast. Mediastinum/Nodes: No enlarged mediastinal or axillary lymph nodes. Thyroid gland, trachea, and esophagus demonstrate no significant findings. Lungs/Pleura: Upper lobe predominant emphysema is noted. There are small bilateral pleural effusions, right greater than left. Chronic areas of consolidation are seen at the right lung base consistent with atelectasis and scarring. No acute airspace disease. No pneumothorax. The central airways are patent. Upper Abdomen: No acute abnormality. Musculoskeletal: No acute or destructive bony lesions. Reconstructed images demonstrate no additional findings. IMPRESSION: 1. Small bilateral pleural effusions, right greater than left. 2. Chronic consolidation at the right lung base, favor atelectasis and scarring. 3. Cardiomegaly. 4. Aortic Atherosclerosis (ICD10-I70.0) and Emphysema (ICD10-J43.9). Electronically Signed   By: Randa Ngo M.D.   On: 07/08/2020 19:53   CT ANGIO CHEST  PE W OR WO CONTRAST  Result Date: 07/09/2020 CLINICAL DATA:  Cough, shortness of breath, heart failure and elevated D-dimer. EXAM: CT ANGIOGRAPHY CHEST WITH CONTRAST TECHNIQUE: Multidetector CT imaging of the chest was performed using the standard protocol during bolus administration of intravenous contrast. Multiplanar CT image reconstructions and MIPs were obtained to evaluate the vascular anatomy. CONTRAST:  78m OMNIPAQUE IOHEXOL 350 MG/ML SOLN COMPARISON:  CT of the chest without contrast yesterday and nuclear medicine pulmonary perfusion scan yesterday. FINDINGS: Cardiovascular: The pulmonary arteries are very well opacified. There is no evidence of acute pulmonary embolism. Central pulmonary arteries are normal in caliber. Stable cardiac enlargement. Stable calcified mitral annulus. No pericardial fluid identified. Extensive calcified coronary artery plaque in a 3 vessel distribution and probable stent in the distribution of the LAD/diagonal branch. Reflux of contrast into the IVC and hepatic veins is consistent with right heart failure. Stable atherosclerosis of the thoracic aorta and great vessel origins without evidence of aneurysmal disease. Mediastinum/Nodes: No enlarged mediastinal, hilar, or axillary lymph nodes. Thyroid gland, trachea, and esophagus demonstrate no significant findings. Lungs/Pleura: Stable small right pleural effusion and trace left pleural fluid. Lungs demonstrate stable interstitial prominence likely reflecting mild pulmonary interstitial edema. No overt airspace edema, airspace consolidation or pneumothorax identified. No focal nodules. Mild atelectasis at the right lung base. Upper Abdomen: Stable trace ascites adjacent to the liver. Some degree of cirrhosis cannot be excluded based on the appearance of the visualized liver. Musculoskeletal: No chest wall abnormality. No acute or significant osseous findings. Review of the MIP images confirms the above findings. IMPRESSION: 1. No  evidence of pulmonary embolism. 2. Stable evidence of mild pulmonary interstitial edema with small right pleural effusion and trace  left pleural effusion. Evidence of right heart failure with reflux of contrast into the IVC and hepatic veins. 3. Extensive calcified coronary artery plaque with prior percutaneous coronary intervention. 4. Trace ascites adjacent to the liver and potential underlying cirrhosis. 5. Aortic atherosclerosis without evidence of aneurysm. Aortic Atherosclerosis (ICD10-I70.0). Electronically Signed   By: Aletta Edouard M.D.   On: 07/09/2020 11:26   NM Pulmonary Perfusion  Result Date: 07/09/2020 CLINICAL DATA:  Suspected.  Negative the diameter. EXAM: NUCLEAR MEDICINE PERFUSION LUNG SCAN TECHNIQUE: Perfusion images were obtained in multiple projections after intravenous injection of radiopharmaceutical. Ventilation scans intentionally deferred if perfusion scan and chest x-ray adequate for interpretation during COVID 19 epidemic. RADIOPHARMACEUTICALS:  4.41 mCi Tc-37mMAA IV COMPARISON:  CT chest 05/08/2020. FINDINGS: Findings consistent elevation right hemidiaphragm again noted. Probable cardiomegaly. Questionable left base perfusion defect. Scan is indeterminate pulmonary embolus. IMPRESSION: Indeterminate scan for pulmonary embolus. Electronically Signed   By: TMarcello Moores Register   On: 07/09/2020 09:07   UKoreaVenous Img Lower Bilateral (DVT)  Result Date: 07/09/2020 CLINICAL DATA:  Bilateral lower extremity swelling for 2-3 years. EXAM: BILATERAL LOWER EXTREMITY VENOUS DOPPLER ULTRASOUND TECHNIQUE: Gray-scale sonography with compression, as well as color and duplex ultrasound, were performed to evaluate the deep venous system(s) from the level of the common femoral vein through the popliteal and proximal calf veins. COMPARISON:  None. FINDINGS: VENOUS Normal compressibility of the common femoral, superficial femoral, and popliteal veins, as well as the visualized calf veins. Visualized  portions of profunda femoral vein and great saphenous vein unremarkable. No filling defects to suggest DVT on grayscale or color Doppler imaging. Doppler waveforms show normal direction of venous flow, normal respiratory plasticity and response to augmentation. Limited views of the contralateral common femoral vein are unremarkable. OTHER The patient has a Baker's cyst on the left measuring 3.4 x 1.0 x 3.4 cm. Limitations: None. IMPRESSION: Negative for DVT. Small Baker's cyst on the left. Electronically Signed   By: TInge RiseM.D.   On: 07/09/2020 11:49      Management plans discussed with the patient, family and they are in agreement.  CODE STATUS:     Code Status Orders  (From admission, onward)         Start     Ordered   07/04/20 2325  Full code  Continuous        07/04/20 2327        Code Status History    Date Active Date Inactive Code Status Order ID Comments User Context   03/08/2020 1913 03/11/2020 1924 Full Code [id number] NIvor Costa MD ED   03/08/2020 1103 03/08/2020 1913 Partial Code [id number] NIvor Costa MD ED   11/22/2019 2135 11/26/2019 1818 Full Code [id number] DAthena Masse MD ED   08/25/2019 2102 08/27/2019 1931 Partial Code [id number] NIvor Costa MD Inpatient   08/25/2019 1729 08/25/2019 2102 Partial Code [id number] NIvor Costa MD ED   06/17/2019 1809 06/21/2019 2316 DNR [id number] PFritzi Mandes MD ED   10/20/2018 1500 10/23/2018 1317 DNR [id number] SHillary Bow MD ED   04/22/2016 0945 04/24/2016 1909 Full Code [id number] MBettey Costa MD Inpatient   04/22/2016 0932 04/22/2016 0945 DNR [id number] MBettey Costa MD Inpatient   04/17/2016 1853 04/20/2016 1352 Full Code [id number] VVaughan Basta MD Inpatient   04/19/2015 2024 04/21/2015 1714 Full Code [id number] PDustin Flock MD Inpatient   11/02/2014 1202 11/08/2014 1741 Full Code [id number] MBettey Costa MD Inpatient  Advance Care Planning Activity    Advance Directive Documentation   Flowsheet Row Most Recent  Value  Type of Advance Directive Living will  Pre-existing out of facility DNR order (yellow form or pink MOST form) --  "MOST" Form in Place? --      TOTAL TIME TAKING CARE OF THIS PATIENT: 35 minutes.    Loletha Grayer M.D on 07/09/2020 at 12:31 PM  Between 7am to 6pm - Pager - [phone]  After 6pm go to [email or web address] - password [email or web address]  [facility]  CC: Primary care physician; Baxter Hire, MD

## 2020-07-09 NOTE — TOC Transition Note (Signed)
Transition of Care [facility]) - CM/SW Discharge Note   Patient Details  Name: Raven [name] MRN: [id number] Date of Birth: 31-Aug-1934  Transition of Care [facility]) CM/SW Contact:  Kerin Salen, RN Phone Number: 07/09/2020, 2:25 PM   Clinical Narrative:  Patient to be discharged to [facility], SNF, and transported by family. TOC barriers resolved.    Final next level of care: Skilled Nursing Facility Barriers to Discharge: Barriers Resolved   Patient Goals and CMS Choice Patient states their goals for this hospitalization and ongoing recovery are:: To SNF.   Choice offered to / list presented to : NA  Discharge Placement                Patient to be transferred to facility by: Family Member   Patient and family notified of of transfer: 07/09/20  Discharge Plan and Services   Discharge Planning Services: CM Consult            DME Arranged: N/A DME Agency: NA       HH Arranged: NA HH Agency: NA        Social Determinants of Health ([place]) Interventions     Readmission Risk Interventions Readmission Risk Prevention Plan 03/11/2020 11/25/2019  Transportation Screening Complete Complete  Medication Review Press photographer) Complete Complete  PCP or Specialist appointment within 3-5 days of discharge Complete Complete  HRI or Home Care Consult Complete Complete  SW Recovery Care/Counseling Consult Complete Complete  Palliative Care Screening Not Applicable Not [place] Complete Not Applicable  Some recent data might be hidden

## 2020-07-10 ENCOUNTER — Ambulatory Visit: Payer: Medicare Other | Admitting: Family

## 2020-07-10 LAB — MITOCHONDRIAL ANTIBODIES: Mitochondrial M2 Ab, IgG: <20 Units (ref 0.0–20.0)

## 2020-07-10 LAB — HEPATITIS B SURFACE ANTIBODY, QUANTITATIVE: Hep B S AB Quant (Post): <3.1 m[IU]/mL — ABNORMAL LOW (ref >9.9)

## 2020-07-13 NOTE — ED Provider Notes (Signed)
.  Critical Care Performed by: Lucrezia Starch, MD Authorized by: Lucrezia Starch, MD   Critical care provider statement:    Critical care time (minutes):  45   Critical care was necessary to treat or prevent imminent or life-threatening deterioration of the following conditions:  Respiratory failure   Critical care was time spent personally by me on the following activities:  Discussions with consultants, evaluation of patient's response to treatment, examination of patient, ordering and performing treatments and interventions, ordering and review of laboratory studies, ordering and review of radiographic studies, pulse oximetry, re-evaluation of patient's condition, obtaining history from patient or surrogate and review of old charts     Lucrezia Starch, MD 07/13/20 0522

## 2020-07-19 ENCOUNTER — Other Ambulatory Visit: Payer: Self-pay

## 2020-07-19 ENCOUNTER — Ambulatory Visit: Payer: Medicare Other | Admitting: Family

## 2020-07-19 ENCOUNTER — Telehealth: Payer: Self-pay | Admitting: Family

## 2020-07-19 ENCOUNTER — Ambulatory Visit
Admission: RE | Admit: 2020-07-19 | Discharge: 2020-07-19 | Disposition: A | Discharge status: Home / Self Care | Payer: Medicare Other | Source: Ambulatory Visit | Attending: Gastroenterology | Admitting: Gastroenterology

## 2020-07-19 DIAGNOSIS — R7401 Elevation of levels of liver transaminase levels: Secondary | ICD-10-CM | POA: Diagnosis not present

## 2020-07-19 DIAGNOSIS — U071 COVID-19: Secondary | ICD-10-CM | POA: Diagnosis not present

## 2020-07-19 DIAGNOSIS — K5000 Crohn's disease of small intestine without complications: Secondary | ICD-10-CM

## 2020-07-19 MED ORDER — IOHEXOL 300 MG/ML  SOLN
80.0000 mL | Freq: Once | INTRAMUSCULAR | Status: AC | PRN
  Administered 2020-07-19: 80 mL via INTRAVENOUS

## 2020-07-19 NOTE — Telephone Encounter (Signed)
Patient did not show for her [facility] Failure [facility] appointment on 07/19/20. Will attempt to reschedule.

## 2020-07-21 ENCOUNTER — Inpatient Hospital Stay
Admission: EM | Admit: 2020-07-21 | Discharge: 2020-07-29 | DRG: 177 | Disposition: A | Discharge status: Skilled Nursing Facility | Payer: Medicare Other | Attending: Internal Medicine | Admitting: Internal Medicine

## 2020-07-21 ENCOUNTER — Emergency Department: Payer: Medicare Other

## 2020-07-21 ENCOUNTER — Other Ambulatory Visit: Payer: Self-pay

## 2020-07-21 DIAGNOSIS — J440 Chronic obstructive pulmonary disease with acute lower respiratory infection: Secondary | ICD-10-CM | POA: Diagnosis present

## 2020-07-21 DIAGNOSIS — M8580 Other specified disorders of bone density and structure, unspecified site: Secondary | ICD-10-CM | POA: Diagnosis present

## 2020-07-21 DIAGNOSIS — F419 Anxiety disorder, unspecified: Secondary | ICD-10-CM | POA: Diagnosis present

## 2020-07-21 DIAGNOSIS — K5090 Crohn's disease, unspecified, without complications: Secondary | ICD-10-CM | POA: Diagnosis present

## 2020-07-21 DIAGNOSIS — E1169 Type 2 diabetes mellitus with other specified complication: Secondary | ICD-10-CM | POA: Diagnosis present

## 2020-07-21 DIAGNOSIS — Z7982 Long term (current) use of aspirin: Secondary | ICD-10-CM

## 2020-07-21 DIAGNOSIS — N390 Urinary tract infection, site not specified: Secondary | ICD-10-CM | POA: Diagnosis present

## 2020-07-21 DIAGNOSIS — R778 Other specified abnormalities of plasma proteins: Secondary | ICD-10-CM | POA: Diagnosis present

## 2020-07-21 DIAGNOSIS — I5023 Acute on chronic systolic (congestive) heart failure: Secondary | ICD-10-CM | POA: Diagnosis present

## 2020-07-21 DIAGNOSIS — I509 Heart failure, unspecified: Secondary | ICD-10-CM

## 2020-07-21 DIAGNOSIS — Z9981 Dependence on supplemental oxygen: Secondary | ICD-10-CM

## 2020-07-21 DIAGNOSIS — U071 COVID-19: Secondary | ICD-10-CM | POA: Diagnosis present

## 2020-07-21 DIAGNOSIS — Z8249 Family history of ischemic heart disease and other diseases of the circulatory system: Secondary | ICD-10-CM

## 2020-07-21 DIAGNOSIS — N179 Acute kidney failure, unspecified: Secondary | ICD-10-CM | POA: Diagnosis present

## 2020-07-21 DIAGNOSIS — J9621 Acute and chronic respiratory failure with hypoxia: Secondary | ICD-10-CM | POA: Diagnosis present

## 2020-07-21 DIAGNOSIS — Z888 Allergy status to other drugs, medicaments and biological substances status: Secondary | ICD-10-CM

## 2020-07-21 DIAGNOSIS — G9341 Metabolic encephalopathy: Secondary | ICD-10-CM | POA: Diagnosis present

## 2020-07-21 DIAGNOSIS — F0390 Unspecified dementia without behavioral disturbance: Secondary | ICD-10-CM | POA: Diagnosis present

## 2020-07-21 DIAGNOSIS — Z87891 Personal history of nicotine dependence: Secondary | ICD-10-CM

## 2020-07-21 DIAGNOSIS — Z8711 Personal history of peptic ulcer disease: Secondary | ICD-10-CM

## 2020-07-21 DIAGNOSIS — N3001 Acute cystitis with hematuria: Secondary | ICD-10-CM | POA: Diagnosis present

## 2020-07-21 DIAGNOSIS — F32A Depression, unspecified: Secondary | ICD-10-CM | POA: Diagnosis present

## 2020-07-21 DIAGNOSIS — R7401 Elevation of levels of liver transaminase levels: Secondary | ICD-10-CM | POA: Diagnosis present

## 2020-07-21 DIAGNOSIS — N1831 Chronic kidney disease, stage 3a: Secondary | ICD-10-CM | POA: Diagnosis present

## 2020-07-21 DIAGNOSIS — Z79899 Other long term (current) drug therapy: Secondary | ICD-10-CM

## 2020-07-21 DIAGNOSIS — J1282 Pneumonia due to coronavirus disease 2019: Secondary | ICD-10-CM | POA: Diagnosis present

## 2020-07-21 DIAGNOSIS — I130 Hypertensive heart and chronic kidney disease with heart failure and stage 1 through stage 4 chronic kidney disease, or unspecified chronic kidney disease: Secondary | ICD-10-CM | POA: Diagnosis present

## 2020-07-21 DIAGNOSIS — I252 Old myocardial infarction: Secondary | ICD-10-CM | POA: Diagnosis not present

## 2020-07-21 DIAGNOSIS — I10 Essential (primary) hypertension: Secondary | ICD-10-CM | POA: Diagnosis present

## 2020-07-21 DIAGNOSIS — I2510 Atherosclerotic heart disease of native coronary artery without angina pectoris: Secondary | ICD-10-CM | POA: Diagnosis present

## 2020-07-21 DIAGNOSIS — Z7951 Long term (current) use of inhaled steroids: Secondary | ICD-10-CM

## 2020-07-21 DIAGNOSIS — F418 Other specified anxiety disorders: Secondary | ICD-10-CM | POA: Diagnosis present

## 2020-07-21 DIAGNOSIS — M069 Rheumatoid arthritis, unspecified: Secondary | ICD-10-CM | POA: Diagnosis present

## 2020-07-21 DIAGNOSIS — Z881 Allergy status to other antibiotic agents status: Secondary | ICD-10-CM

## 2020-07-21 DIAGNOSIS — R7989 Other specified abnormal findings of blood chemistry: Secondary | ICD-10-CM

## 2020-07-21 DIAGNOSIS — J9611 Chronic respiratory failure with hypoxia: Secondary | ICD-10-CM | POA: Diagnosis present

## 2020-07-21 DIAGNOSIS — E785 Hyperlipidemia, unspecified: Secondary | ICD-10-CM | POA: Diagnosis present

## 2020-07-21 DIAGNOSIS — J449 Chronic obstructive pulmonary disease, unspecified: Secondary | ICD-10-CM | POA: Diagnosis present

## 2020-07-21 DIAGNOSIS — Z880 Allergy status to penicillin: Secondary | ICD-10-CM

## 2020-07-21 LAB — COMPREHENSIVE METABOLIC PANEL
ALT: 100 U/L — ABNORMAL HIGH (ref 0–44)
AST: 100 U/L — ABNORMAL HIGH (ref 15–41)
Albumin: 3.4 g/dL — ABNORMAL LOW (ref 3.5–5.0)
Alkaline Phosphatase: 352 U/L — ABNORMAL HIGH (ref 38–126)
Anion gap: 13 (ref 5–15)
BUN: 34 mg/dL — ABNORMAL HIGH (ref 8–23)
CO2: 26 mmol/L (ref 22–32)
Calcium: 8.5 mg/dL — ABNORMAL LOW (ref 8.9–10.3)
Chloride: 102 mmol/L (ref 98–111)
Creatinine, Ser: 1.14 mg/dL — ABNORMAL HIGH (ref 0.44–1.00)
GFR, Estimated: 47 mL/min — ABNORMAL LOW (ref >60)
Glucose, Bld: 94 mg/dL (ref 70–99)
Potassium: 4.1 mmol/L (ref 3.5–5.1)
Sodium: 141 mmol/L (ref 135–145)
Total Bilirubin: 1.6 mg/dL — ABNORMAL HIGH (ref 0.3–1.2)
Total Protein: 6.1 g/dL — ABNORMAL LOW (ref 6.5–8.1)

## 2020-07-21 LAB — CBC WITH DIFFERENTIAL/PLATELET
Abs Immature Granulocytes: 0.09 10*3/uL — ABNORMAL HIGH (ref 0.00–0.07)
Basophils Absolute: 0 10*3/uL (ref 0.0–0.1)
Basophils Relative: 0 %
Eosinophils Absolute: 0 10*3/uL (ref 0.0–0.5)
Eosinophils Relative: 0 %
HCT: 44.3 % (ref 36.0–46.0)
Hemoglobin: 14.1 g/dL (ref 12.0–15.0)
Immature Granulocytes: 1 %
Lymphocytes Relative: 7 %
Lymphs Abs: 0.7 10*3/uL (ref 0.7–4.0)
MCH: 28.5 pg (ref 26.0–34.0)
MCHC: 31.8 g/dL (ref 30.0–36.0)
MCV: 89.7 fL (ref 80.0–100.0)
Monocytes Absolute: 1 10*3/uL (ref 0.1–1.0)
Monocytes Relative: 11 %
Neutro Abs: 7.3 10*3/uL (ref 1.7–7.7)
Neutrophils Relative %: 81 %
Platelets: 116 10*3/uL — ABNORMAL LOW (ref 150–400)
RBC: 4.94 MIL/uL (ref 3.87–5.11)
RDW: 16 % — ABNORMAL HIGH (ref 11.5–15.5)
WBC: 9.2 10*3/uL (ref 4.0–10.5)
nRBC: 0 % (ref 0.0–0.2)

## 2020-07-21 LAB — URINALYSIS, COMPLETE (UACMP) WITH MICROSCOPIC
Bilirubin Urine: NEGATIVE
Glucose, UA: NEGATIVE mg/dL
Ketones, ur: NEGATIVE mg/dL
Nitrite: NEGATIVE
Protein, ur: NEGATIVE mg/dL
Specific Gravity, Urine: 1.017 (ref 1.005–1.030)
Squamous Epithelial / HPF: NONE SEEN (ref 0–5)
pH: 5 (ref 5.0–8.0)

## 2020-07-21 LAB — LACTIC ACID, PLASMA
Lactic Acid, Venous: 1.5 mmol/L (ref 0.5–1.9)
Lactic Acid, Venous: 1.5 mmol/L (ref 0.5–1.9)

## 2020-07-21 LAB — PROCALCITONIN: Procalcitonin: 0.13 ng/mL

## 2020-07-21 LAB — RESP PANEL BY RT-PCR (FLU A&B, COVID) ARPGX2
Influenza A by PCR: NEGATIVE
Influenza B by PCR: NEGATIVE
SARS Coronavirus 2 by RT PCR: POSITIVE — AB

## 2020-07-21 LAB — PROTIME-INR
INR: 1.1 (ref 0.8–1.2)
INR: 1.1 (ref 0.8–1.2)
Prothrombin Time: 14.1 seconds (ref 11.4–15.2)
Prothrombin Time: 14 seconds (ref 11.4–15.2)

## 2020-07-21 LAB — APTT
aPTT: 20 seconds — ABNORMAL LOW (ref 24–36)
aPTT: 26 seconds (ref 24–36)

## 2020-07-21 LAB — BRAIN NATRIURETIC PEPTIDE: B Natriuretic Peptide: >4500 pg/mL — ABNORMAL HIGH (ref 0.0–100.0)

## 2020-07-21 MED ORDER — CYANOCOBALAMIN 500 MCG PO TABS
500.0000 ug | ORAL_TABLET | Freq: Every day | ORAL | Status: DC
  Administered 2020-07-21 – 2020-07-29 (×9): 500 ug via ORAL
  Filled 2020-07-21 (×9): qty 1

## 2020-07-21 MED ORDER — SODIUM CHLORIDE 0.9 % IV SOLN
200.0000 mg | Freq: Once | INTRAVENOUS | Status: DC
  Filled 2020-07-21: qty 40

## 2020-07-21 MED ORDER — DM-GUAIFENESIN ER 30-600 MG PO TB12
1.0000 | ORAL_TABLET | Freq: Two times a day (BID) | ORAL | Status: DC | PRN
  Administered 2020-07-25 – 2020-07-28 (×5): 1 via ORAL
  Filled 2020-07-21 (×6): qty 1

## 2020-07-21 MED ORDER — PANTOPRAZOLE SODIUM 40 MG PO TBEC
40.0000 mg | DELAYED_RELEASE_TABLET | Freq: Every day | ORAL | Status: DC
  Administered 2020-07-21 – 2020-07-29 (×9): 40 mg via ORAL
  Filled 2020-07-21 (×9): qty 1

## 2020-07-21 MED ORDER — SODIUM CHLORIDE 0.9 % IV SOLN
100.0000 mg | Freq: Every day | INTRAVENOUS | Status: AC
  Administered 2020-07-22 – 2020-07-25 (×4): 100 mg via INTRAVENOUS
  Filled 2020-07-21: qty 100
  Filled 2020-07-21: qty 20
  Filled 2020-07-21: qty 100
  Filled 2020-07-21: qty 20

## 2020-07-21 MED ORDER — ASCORBIC ACID 500 MG PO TABS
500.0000 mg | ORAL_TABLET | Freq: Every day | ORAL | Status: DC
  Administered 2020-07-21 – 2020-07-29 (×9): 500 mg via ORAL
  Filled 2020-07-21 (×9): qty 1

## 2020-07-21 MED ORDER — ACETAMINOPHEN 325 MG PO TABS
650.0000 mg | ORAL_TABLET | ORAL | Status: DC | PRN
  Administered 2020-07-24 – 2020-07-27 (×2): 650 mg via ORAL
  Filled 2020-07-21 (×2): qty 2

## 2020-07-21 MED ORDER — ATORVASTATIN CALCIUM 20 MG PO TABS: 80.0000 mg | ORAL_TABLET | Freq: Every day | ORAL | Status: DC

## 2020-07-21 MED ORDER — MESALAMINE ER 250 MG PO CPCR
500.0000 mg | ORAL_CAPSULE | Freq: Two times a day (BID) | ORAL | Status: DC
  Administered 2020-07-21 – 2020-07-29 (×16): 500 mg via ORAL
  Filled 2020-07-21 (×19): qty 2

## 2020-07-21 MED ORDER — TRAZODONE HCL 50 MG PO TABS
50.0000 mg | ORAL_TABLET | Freq: Every evening | ORAL | Status: DC | PRN
  Administered 2020-07-22 – 2020-07-24 (×2): 50 mg via ORAL
  Filled 2020-07-21 (×2): qty 1

## 2020-07-21 MED ORDER — SACUBITRIL-VALSARTAN 24-26 MG PO TABS
1.0000 | ORAL_TABLET | Freq: Two times a day (BID) | ORAL | Status: DC
  Administered 2020-07-21 – 2020-07-25 (×9): 1 via ORAL
  Filled 2020-07-21 (×10): qty 1

## 2020-07-21 MED ORDER — ONDANSETRON HCL 4 MG/2ML IJ SOLN: 4.0000 mg | Freq: Four times a day (QID) | INTRAMUSCULAR | Status: DC | PRN

## 2020-07-21 MED ORDER — FUROSEMIDE 10 MG/ML IJ SOLN
80.0000 mg | Freq: Once | INTRAMUSCULAR | Status: AC
  Administered 2020-07-21: 80 mg via INTRAVENOUS
  Filled 2020-07-21: qty 8

## 2020-07-21 MED ORDER — SODIUM CHLORIDE 0.9% FLUSH
3.0000 mL | INTRAVENOUS | Status: DC | PRN
  Administered 2020-07-22: 3 mL via INTRAVENOUS

## 2020-07-21 MED ORDER — DEXAMETHASONE SODIUM PHOSPHATE 10 MG/ML IJ SOLN: 6.0000 mg | Freq: Once | INTRAMUSCULAR | Status: DC

## 2020-07-21 MED ORDER — FOLIC ACID 1 MG PO TABS
500.0000 ug | ORAL_TABLET | Freq: Every day | ORAL | Status: DC
  Administered 2020-07-21 – 2020-07-29 (×9): 0.5 mg via ORAL
  Filled 2020-07-21 (×9): qty 1

## 2020-07-21 MED ORDER — ALLOPURINOL 100 MG PO TABS
100.0000 mg | ORAL_TABLET | Freq: Every day | ORAL | Status: DC
  Administered 2020-07-21 – 2020-07-29 (×9): 100 mg via ORAL
  Filled 2020-07-21 (×9): qty 1

## 2020-07-21 MED ORDER — FUROSEMIDE 10 MG/ML IJ SOLN: 40.0000 mg | Freq: Every day | INTRAMUSCULAR | Status: DC

## 2020-07-21 MED ORDER — AMITRIPTYLINE HCL 10 MG PO TABS
20.0000 mg | ORAL_TABLET | Freq: Every day | ORAL | Status: DC
  Administered 2020-07-22 – 2020-07-28 (×8): 20 mg via ORAL
  Filled 2020-07-21 (×12): qty 2

## 2020-07-21 MED ORDER — SODIUM CHLORIDE 0.9% FLUSH
3.0000 mL | Freq: Two times a day (BID) | INTRAVENOUS | Status: DC
  Administered 2020-07-22 – 2020-07-28 (×14): 3 mL via INTRAVENOUS

## 2020-07-21 MED ORDER — COLCHICINE 0.6 MG PO TABS
0.6000 mg | ORAL_TABLET | Freq: Every day | ORAL | Status: DC
  Administered 2020-07-21 – 2020-07-29 (×9): 0.6 mg via ORAL
  Filled 2020-07-21 (×9): qty 1

## 2020-07-21 MED ORDER — ENOXAPARIN SODIUM 40 MG/0.4ML IJ SOSY
40.0000 mg | PREFILLED_SYRINGE | INTRAMUSCULAR | Status: DC
  Administered 2020-07-21 – 2020-07-28 (×8): 40 mg via SUBCUTANEOUS
  Filled 2020-07-21 (×8): qty 0.4

## 2020-07-21 MED ORDER — ASPIRIN EC 81 MG PO TBEC
81.0000 mg | DELAYED_RELEASE_TABLET | Freq: Every day | ORAL | Status: DC
  Administered 2020-07-21 – 2020-07-29 (×9): 81 mg via ORAL
  Filled 2020-07-21 (×9): qty 1

## 2020-07-21 MED ORDER — SODIUM CHLORIDE 0.9 % IV SOLN
1.0000 g | INTRAVENOUS | Status: AC
  Administered 2020-07-21 – 2020-07-25 (×5): 1 g via INTRAVENOUS
  Filled 2020-07-21: qty 10
  Filled 2020-07-21 (×2): qty 1
  Filled 2020-07-21: qty 10
  Filled 2020-07-21: qty 1

## 2020-07-21 MED ORDER — SODIUM CHLORIDE 0.9 % IV SOLN
200.0000 mg | Freq: Once | INTRAVENOUS | Status: AC
  Administered 2020-07-21: 200 mg via INTRAVENOUS
  Filled 2020-07-21 (×2): qty 40

## 2020-07-21 MED ORDER — UMECLIDINIUM-VILANTEROL 62.5-25 MCG/INH IN AEPB
1.0000 | INHALATION_SPRAY | Freq: Every day | RESPIRATORY_TRACT | Status: DC
  Administered 2020-07-21 – 2020-07-29 (×9): 1 via RESPIRATORY_TRACT
  Filled 2020-07-21 (×2): qty 14

## 2020-07-21 MED ORDER — LEFLUNOMIDE 20 MG PO TABS
10.0000 mg | ORAL_TABLET | Freq: Every day | ORAL | Status: DC
  Administered 2020-07-21 – 2020-07-29 (×9): 10 mg via ORAL
  Filled 2020-07-21 (×9): qty 0.5

## 2020-07-21 MED ORDER — ADULT MULTIVITAMIN W/MINERALS CH
1.0000 | ORAL_TABLET | Freq: Every day | ORAL | Status: DC
  Administered 2020-07-21 – 2020-07-29 (×9): 1 via ORAL
  Filled 2020-07-21 (×9): qty 1

## 2020-07-21 MED ORDER — SODIUM CHLORIDE 0.9 % IV SOLN: 250.0000 mL | INTRAVENOUS | Status: DC | PRN

## 2020-07-21 MED ORDER — ACETAMINOPHEN 500 MG PO TABS: 500.0000 mg | ORAL_TABLET | Freq: Four times a day (QID) | ORAL | Status: DC | PRN

## 2020-07-21 MED ORDER — ZINC SULFATE 220 (50 ZN) MG PO CAPS
220.0000 mg | ORAL_CAPSULE | Freq: Every day | ORAL | Status: DC
  Administered 2020-07-21 – 2020-07-29 (×9): 220 mg via ORAL
  Filled 2020-07-21 (×9): qty 1

## 2020-07-21 MED ORDER — THIAMINE HCL 100 MG PO TABS
100.0000 mg | ORAL_TABLET | Freq: Every day | ORAL | Status: DC
  Administered 2020-07-21 – 2020-07-29 (×9): 100 mg via ORAL
  Filled 2020-07-21 (×9): qty 1

## 2020-07-21 MED ORDER — METOPROLOL SUCCINATE ER 50 MG PO TB24
75.0000 mg | ORAL_TABLET | Freq: Every day | ORAL | Status: DC
  Administered 2020-07-21 – 2020-07-25 (×5): 75 mg via ORAL
  Filled 2020-07-21 (×2): qty 2
  Filled 2020-07-21 (×3): qty 1

## 2020-07-21 MED ORDER — SODIUM CHLORIDE 0.9 % IV SOLN: 100.0000 mg | Freq: Every day | INTRAVENOUS | Status: DC

## 2020-07-21 MED ORDER — METHYLPREDNISOLONE SODIUM SUCC 40 MG IJ SOLR
0.5000 mg/kg | Freq: Two times a day (BID) | INTRAMUSCULAR | Status: AC
  Administered 2020-07-21 – 2020-07-24 (×6): 39.2 mg via INTRAVENOUS
  Filled 2020-07-21 (×6): qty 1

## 2020-07-21 MED ORDER — ALBUTEROL SULFATE HFA 108 (90 BASE) MCG/ACT IN AERS
2.0000 | INHALATION_SPRAY | Freq: Four times a day (QID) | RESPIRATORY_TRACT | Status: DC | PRN
  Filled 2020-07-21: qty 6.7

## 2020-07-21 MED ORDER — PREDNISONE 50 MG PO TABS
50.0000 mg | ORAL_TABLET | Freq: Every day | ORAL | Status: DC
  Administered 2020-07-24 – 2020-07-25 (×2): 50 mg via ORAL
  Filled 2020-07-21 (×2): qty 1

## 2020-07-21 MED ORDER — CIPROFLOXACIN IN D5W 400 MG/200ML IV SOLN: 400.0000 mg | Freq: Once | INTRAVENOUS | Status: DC

## 2020-07-21 MED ORDER — POTASSIUM CHLORIDE CRYS ER 10 MEQ PO TBCR
10.0000 meq | EXTENDED_RELEASE_TABLET | Freq: Every day | ORAL | Status: DC
  Administered 2020-07-22 – 2020-07-23 (×2): 10 meq via ORAL
  Filled 2020-07-21 (×2): qty 1

## 2020-07-21 NOTE — ED Notes (Addendum)
Ice water provided, room temp turned to 76 (max) upon request, no other needs at this time

## 2020-07-21 NOTE — ED Notes (Signed)
In and out cath unsuccessful. Pt hooked up to [place].

## 2020-07-21 NOTE — ED Provider Notes (Signed)
[facility] Emergency Department Provider Note  ____________________________________________   Event Date/Time   First MD Initiated Contact with Patient 07/21/20 1038     (approximate)  I have reviewed the triage vital signs and the nursing notes.   HISTORY  Chief Complaint Fever and Altered Mental Status   HPI Raven [name] is a 86 y.o. female with a past medical history of COPD associated chronic hypoxic respiratory failure on 2 L at baseline, CHF, asthma, arthritis, anemia, depression, HTN, HDL, PUD, CAD, Crohn's disease and recently received her first Omair injection yesterday who presents EMS from nursing facility with concerns for altered mental status and fever.  Patient states she is not sure why she is in the emergency room.  She states she does not feel good but is unable to further clarify.  She denies any headache, chest pain, cough, vomiting, diarrhea rash, back pain or recent injuries.  She is oriented to self and person and unable provide any additional history.  Per EMS this is her baseline mental status.          Past Medical History:  Diagnosis Date  . Anemia   . Arthritis   . Asthma   . CHF (congestive heart failure) ([place])   . Complication of anesthesia   . COPD (chronic obstructive pulmonary disease) ([place])   . Crohn's disease ([place])   . Depression    after death of husband  . Hyperlipidemia   . Hypertension   . Myocardial infarction ([place])   . Osteopenia   . Peptic ulcer disease   . Pneumonia   . PONV (postoperative nausea and vomiting)     Patient Active Problem List   Diagnosis Date Noted  . COVID-19 virus detected 07/21/2020  . Acute lower UTI 07/21/2020  . SVT (supraventricular tachycardia) ([place])   . Acute gout of left ankle   . Acute pain of left shoulder   . Type 2 diabetes mellitus with hyperlipidemia ([place])   . Acute kidney injury superimposed on CKD ([place]) 07/04/2020  . Dizziness 07/04/2020  . Dyspnea  03/09/2020  . Nausea vomiting and diarrhea 03/08/2020  . Hyperkalemia   . Hypomagnesemia   . Pain in both lower extremities   . Generalized weakness   . Rheumatoid arthritis ([place])   . Acute on chronic systolic CHF (congestive heart failure) ([place]) 11/22/2019  . Acute on chronic systolic (congestive) heart failure ([place]) 11/22/2019  . Chronic respiratory failure with hypoxia ([place]) 11/22/2019  . HLD (hyperlipidemia) 08/25/2019  . HTN (hypertension) 08/25/2019  . Depression with anxiety   . CAD (coronary artery disease)   . Transaminitis   . Hypokalemia   . Crohn's disease ([place])   . Acute on chronic respiratory failure with hypoxia ([place])   . Steroid-dependent COPD ([place])   . SBO (small bowel obstruction) ([place]) 06/17/2019  . Crohn's disease of small intestine with intestinal obstruction ([place])   . COPD with acute exacerbation ([place]) 10/20/2018  . COPD (chronic obstructive pulmonary disease) ([place]) 04/22/2016  . Acute bronchitis 04/17/2016  . Sepsis ([place]) 04/17/2016  . Peroneal tendonitis 07/18/2015  . Pneumonia 04/19/2015  . CAP (community acquired pneumonia) 11/02/2014    Past Surgical History:  Procedure Laterality Date  . ABDOMINAL SURGERY    . CARDIAC CATHETERIZATION     with stent  . COLONOSCOPY WITH PROPOFOL N/A 09/27/2014   Procedure: COLONOSCOPY WITH PROPOFOL;  Surgeon: [name], MD;  Location: [facility] ENDOSCOPY;  Service: Gastroenterology;  Laterality: N/A;  . COLONOSCOPY  WITH PROPOFOL N/A 06/21/2019   Procedure: COLONOSCOPY WITH PROPOFOL;  Surgeon: Lin Landsman, MD;  Location: [facility] ENDOSCOPY;  Service: Gastroenterology;  Laterality: N/A;  . EYE SURGERY     bilateral cataract surgeries  . SHOULDER SURGERY Right    x 2   . SMALL INTESTINE SURGERY      Prior to Admission medications   Medication Sig Start Date End Date Taking? Authorizing Provider  acetaminophen (TYLENOL) 500 MG tablet Take 500-1,000 mg by mouth every 6 (six) hours as needed for mild pain, moderate  pain or fever.    [provider]  Adalimumab 40 MG/0.8ML PSKT Inject 40 mg into the skin every 30 (thirty) days.     [provider]  albuterol (PROVENTIL HFA;VENTOLIN HFA) 108 (90 BASE) MCG/ACT inhaler Inhale 2 puffs into the lungs every 6 (six) hours as needed for wheezing or shortness of breath.    [provider]  allopurinol (ZYLOPRIM) 100 MG tablet Take 1 tablet (100 mg total) by mouth daily. 07/09/20 07/09/21  Loletha Grayer, MD  amitriptyline (ELAVIL) 10 MG tablet Take 20 mg by mouth at bedtime.    [provider]  ANORO ELLIPTA 62.5-25 MCG/INH AEPB Inhale 1 puff into the lungs daily. 05/29/19   [provider]  aspirin EC 81 MG tablet Take 81 mg by mouth daily.    [provider]  atorvastatin (LIPITOR) 80 MG tablet Take 80 mg by mouth daily.    [provider]  Calcium-Phosphorus-Vitamin D (CITRACAL +D3 PO) Take 1 tablet by mouth daily.    [provider]  chlorpheniramine-HYDROcodone (TUSSIONEX) 10-8 MG/5ML SUER Take 5 mLs by mouth every 12 (twelve) hours as needed (cough unrelieved with robitussen). 07/09/20   Loletha Grayer, MD  Cobalamin Combinations (VITAMIN B12-FOLIC ACID) 938-182 MCG TABS Take 1 tablet by mouth as directed.    [provider]  colchicine 0.6 MG tablet Take 1 tablet (0.6 mg total) by mouth daily. 07/10/20   Loletha Grayer, MD  dextromethorphan-guaiFENesin [facility] DM) 30-600 MG 12hr tablet Take 1 tablet by mouth 2 (two) times daily as needed for cough. 03/11/20   Nicole Kindred A, DO  ENTRESTO 24-26 MG TAKE ONE TABLET BY MOUTH TWICE DAILY Patient taking differently: Take 1 tablet by mouth 2 (two) times daily. 02/29/20   Alisa Graff, FNP  esomeprazole (NEXIUM) 40 MG capsule Take 40 mg by mouth daily. 05/29/19   [provider]  folic acid (FOLVITE) 993 MCG tablet Take 400 mcg by mouth daily.    [provider]  furosemide (LASIX) 40 MG tablet Take 40 mg by mouth daily.     [provider]  leflunomide (ARAVA) 10 MG tablet Take 10 mg by mouth daily. 03/29/19   [provider]  mesalamine (PENTASA) 500 MG CR capsule Take 500 mg by mouth 2 (two) times daily.    [provider]  metoprolol succinate (TOPROL-XL) 25 MG 24 hr tablet Take 3 tablets (75 mg total) by mouth daily. Take with or immediately following a meal. 07/10/20   Loletha Grayer, MD  Multiple Vitamin (MULTIVITAMIN WITH MINERALS) TABS tablet Take 1 tablet by mouth daily.    [provider]  ondansetron (ZOFRAN-ODT) 4 MG disintegrating tablet Take 4 mg by mouth every 12 (twelve) hours as needed for nausea/vomiting. 07/03/20   [provider]  potassium chloride (KLOR-CON) 10 MEQ tablet Take 10 mEq by mouth daily.    [provider]  predniSONE (DELTASONE) 5 MG tablet 6 tabs  po day1,2; 5 tabs po day3,4; 4 tabs po day 5,6; 3 tabs po day7,8; 2 tabs po day9,10; 1.5 tabs daily afterwards 07/09/20   Loletha Grayer, MD  thiamine (VITAMIN B-1) 100 MG tablet Take 100 mg by mouth daily.    [provider]  torsemide (DEMADEX) 20 MG tablet Take 1 tablet (20 mg total) by mouth 2 (two) times daily. 07/09/20   Loletha Grayer, MD  traZODone (DESYREL) 50 MG tablet Take 1 tablet (50 mg total) by mouth at bedtime as needed for sleep. 07/09/20   Loletha Grayer, MD  vitamin E 400 UNIT capsule Take 400 Units by mouth daily.    [provider]    Allergies Penicillins, Augmentin [amoxicillin-pot clavulanate], Indocin [indomethacin], Lodine [etodolac], Methotrexate derivatives, Gold, and Zantac [ranitidine hcl]  Family History  Problem Relation Age of Onset  . Hypertension Other     Social History Social History   Tobacco Use  . Smoking status: Former Smoker    Quit date: 1974    Years since quitting: 48.4  . Smokeless tobacco: Never Used  Vaping Use  . Vaping Use: Never used  Substance Use Topics  . Alcohol use: Yes    Comment: occassional  .  Drug use: No    Review of Systems  Review of Systems  Unable to perform ROS: Dementia      ____________________________________________   PHYSICAL EXAM:  VITAL SIGNS: ED Triage Vitals [07/21/20 1042]  Enc Vitals Group     BP      Pulse      Resp      Temp      Temp src      SpO2      Weight 171 lb 15.3 oz (78 kg)     Height _0  (1.676 m)     Head Circumference      Peak Flow      Pain Score 8     Pain Loc      Pain Edu?      Excl. in [place]?    Vitals:   07/21/20 1345 07/21/20 1400  BP:  126/66  Pulse:    Resp: (!) 21 20  Temp:    SpO2:     Physical Exam Vitals and nursing note reviewed.  Constitutional:      General: She is not in acute distress.    Appearance: She is well-developed.  HENT:     Head: Normocephalic and atraumatic.     Right Ear: External ear normal.     Left Ear: External ear normal.     Nose: Nose normal.  Eyes:     Conjunctiva/sclera: Conjunctivae normal.  Cardiovascular:     Rate and Rhythm: Normal rate and regular rhythm.     Heart sounds: No murmur heard.   Pulmonary:     Effort: Pulmonary effort is normal. No respiratory distress.     Breath sounds: Normal breath sounds.  Abdominal:     Palpations: Abdomen is soft.     Tenderness: There is no abdominal tenderness.  Musculoskeletal:     Cervical back: Neck supple.     Right lower leg: Edema present.     Left lower leg: Edema present.  Skin:    General: Skin is warm and dry.  Neurological:     Mental Status: She is alert. She is disoriented and confused.      ____________________________________________   LABS (all labs ordered are listed, but only abnormal results are displayed)  Labs  Reviewed  RESP PANEL BY RT-PCR (FLU A&B, COVID) ARPGX2 - Abnormal; Notable for the following components:      Result Value   SARS Coronavirus 2 by RT PCR POSITIVE (*)    All other components within normal limits  COMPREHENSIVE METABOLIC PANEL - Abnormal; Notable for the following  components:   BUN 34 (*)    Creatinine, Ser 1.14 (*)    Calcium 8.5 (*)    Total Protein 6.1 (*)    Albumin 3.4 (*)    AST 100 (*)    ALT 100 (*)    Alkaline Phosphatase 352 (*)    Total Bilirubin 1.6 (*)    GFR, Estimated 47 (*)    All other components within normal limits  APTT - Abnormal; Notable for the following components:   aPTT 20 (*)    All other components within normal limits  URINALYSIS, COMPLETE (UACMP) WITH MICROSCOPIC - Abnormal; Notable for the following components:   Color, Urine YELLOW (*)    APPearance CLEAR (*)    Hgb urine dipstick MODERATE (*)    Leukocytes,Ua MODERATE (*)    Bacteria, UA RARE (*)    All other components within normal limits  BRAIN NATRIURETIC PEPTIDE - Abnormal; Notable for the following components:   B Natriuretic Peptide >4,500.0 (*)    All other components within normal limits  CBC WITH DIFFERENTIAL/PLATELET - Abnormal; Notable for the following components:   RDW 16.0 (*)    Platelets 116 (*)    Abs Immature Granulocytes 0.09 (*)    All other components within normal limits  CULTURE, BLOOD (SINGLE)  URINE CULTURE  LACTIC ACID, PLASMA  LACTIC ACID, PLASMA  PROTIME-INR  PROCALCITONIN  PROTIME-INR  APTT  CBC WITH DIFFERENTIAL/PLATELET   ____________________________________________  EKG  Sinus rhythm with a ventricle rate of 84, atrial premature complexes, nonspecific changes in inferior anterior leads. ____________________________________________  RADIOLOGY  ED MD interpretation: CT head is unremarkable for any acute intracranial .  Chest x-ray shows bilateral effusions and interstitial edema.  Vital quadrant ultrasound shows no gallstones, and normal bile duct.  Official radiology report(s): CT Head Wo Contrast  Result Date: 07/21/2020 CLINICAL DATA:  Altered mental status EXAM: CT HEAD WITHOUT CONTRAST TECHNIQUE: Contiguous axial images were obtained from the base of the skull through the vertex without intravenous  contrast. COMPARISON:  None. FINDINGS: Brain: No evidence of acute infarction, hemorrhage, hydrocephalus, extra-axial collection or mass lesion/mass effect. Mild periventricular white matter hypodensity. Vascular: No hyperdense vessel or unexpected calcification. Skull: Normal. Negative for fracture or focal lesion. Sinuses/Orbits: No acute finding. Other: None. IMPRESSION: No acute intracranial pathology. Small-vessel white matter disease. Electronically Signed   By: Eddie Candle M.D.   On: 07/21/2020 12:33   DG Chest Port 1 View  Result Date: 07/21/2020 CLINICAL DATA:  evaluate for sepsis. EXAM: PORTABLE CHEST 1 VIEW COMPARISON:  07/08/2020 FINDINGS: Normal heart size. Unchanged asymmetric elevation of the right hemidiaphragm. Small pleural effusions are noted, right greater than left. Mild diffuse interstitial edema is stable to increased in the interval. IMPRESSION: Bilateral pleural effusions and interstitial edema.  Unchanged. Electronically Signed   By: Kerby Moors M.D.   On: 07/21/2020 11:20   US ABDOMEN LIMITED RUQ (LIVER/GB)  Result Date: 07/21/2020 CLINICAL DATA:  Transaminitis EXAM: ULTRASOUND ABDOMEN LIMITED RIGHT UPPER QUADRANT COMPARISON:  Abdominal ultrasound 06/13/2020 FINDINGS: Gallbladder: No gallstones or wall thickening visualized. No sonographic Murphy sign noted by sonographer. Common bile duct: Diameter: 0.5 cm, within normal limits Liver: No focal  lesion identified. Within normal limits in parenchymal echogenicity. Portal vein is patent on color Doppler imaging with normal direction of blood flow towards the liver. Other: None. IMPRESSION: Unremarkable sonographic exam of the right upper quadrant. Electronically Signed   By: Audie Pinto M.D.   On: 07/21/2020 12:25    ____________________________________________   PROCEDURES  Procedure(s) performed (including Critical Care):  .Critical Care Performed by: Lucrezia Starch, MD Authorized by: Lucrezia Starch, MD    Critical care provider statement:    Critical care time (minutes):  45   Critical care time was exclusive of:  Separately billable procedures and treating other patients   Critical care was necessary to treat or prevent imminent or life-threatening deterioration of the following conditions:  Respiratory failure   Critical care was time spent personally by me on the following activities:  Discussions with consultants, evaluation of patient's response to treatment, examination of patient, ordering and performing treatments and interventions, ordering and review of laboratory studies, ordering and review of radiographic studies, pulse oximetry, re-evaluation of patient's condition, obtaining history from patient or surrogate and review of old charts     ____________________________________________   INITIAL IMPRESSION / [place] / ED COURSE        Patient presents with above-stated history and exam for assessment of slightly increased confusion from baseline as well as low oxygen seen at facility earlier today.  She is already on 2 L taper down to 3 L she was satting 88%.  On arrival she is noted to have a temperature of 100.2 and 91% on 2 L which came up to 94% on 3 L.  She is otherwise stable vital signs.  She seems to be at her neurological baseline is oriented to person and place but not otherwise which her son who later arrived at bedside confirms is her baseline.  She has no known focal deficits on exam.  Regard to hypoxia she does look volume overloaded on exam and on x-ray and given BNP >4500 I suspect this is likely secondary to acute heart failure exacerbation.  She denies any chest pain.  Lactic acid nonelevated.  CBC shows no leukocytosis or acute anemia.  Overall low suspicion for PE at this time given findings of gross volume overload.  Regarding patient's elevated temperature UA does appear consistent with urine tract infection.  Last CMP did have transaminitis and  elevated alk phos with T bili of 1.6 quadrant ultrasound shows no gallstones or dilated common bile duct and I am unclear etiology of his transaminitis at this time.  No other significant electrolyte or metabolic derangements identified.  COVID is positive.  This is likely contributing to patient's hypoxia.  Will order Decadron and remdesivir.  Concern for urinary tract infection urine culture ordered and patient given a dose of Cipro.  She was also given IV Lasix.  We will plan admit to medicine service for further evaluation management.      ____________________________________________   FINAL CLINICAL IMPRESSION(S) / ED DIAGNOSES  Final diagnoses:  Transaminitis  COVID  Acute cystitis with hematuria  Acute on chronic congestive heart failure, unspecified heart failure type (HCC)  Elevated brain natriuretic peptide (BNP) level  Acute on chronic respiratory failure with hypoxia (HCC)    Medications  acetaminophen (TYLENOL) tablet 500-1,000 mg (has no administration in time range)  allopurinol (ZYLOPRIM) tablet 100 mg (has no administration in time range)  aspirin EC tablet 81 mg (has no administration in time range)  colchicine tablet 0.6  mg (has no administration in time range)  leflunomide (ARAVA) tablet 10 mg (has no administration in time range)  sacubitril-valsartan (ENTRESTO) 24-26 mg per tablet (has no administration in time range)  metoprolol succinate (TOPROL-XL) 24 hr tablet 75 mg (has no administration in time range)  amitriptyline (ELAVIL) tablet 20 mg (has no administration in time range)  traZODone (DESYREL) tablet 50 mg (has no administration in time range)  pantoprazole (PROTONIX) EC tablet 40 mg (has no administration in time range)  mesalamine (PENTASA) CR capsule 500 mg (has no administration in time range)  vitamin B-12 (CYANOCOBALAMIN) tablet 500 mcg (has no administration in time range)  folic acid (FOLVITE) tablet 0.5 mg (has no administration in time range)   multivitamin with minerals tablet 1 tablet (has no administration in time range)  potassium chloride SA (KLOR-CON) CR tablet 10 mEq (has no administration in time range)  thiamine tablet 100 mg (has no administration in time range)  albuterol (VENTOLIN HFA) 108 (90 Base) MCG/ACT inhaler 2 puff (has no administration in time range)  umeclidinium-vilanterol (ANORO ELLIPTA) 62.5-25 MCG/INH 1 puff (has no administration in time range)  dextromethorphan-guaiFENesin (MUCINEX DM) 30-600 MG per 12 hr tablet 1 tablet (has no administration in time range)  sodium chloride flush (NS) 0.9 % injection 3 mL (has no administration in time range)  sodium chloride flush (NS) 0.9 % injection 3 mL (has no administration in time range)  0.9 %  sodium chloride infusion (has no administration in time range)  acetaminophen (TYLENOL) tablet 650 mg (has no administration in time range)  ondansetron (ZOFRAN) injection 4 mg (has no administration in time range)  enoxaparin (LOVENOX) injection 40 mg (has no administration in time range)  furosemide (LASIX) injection 40 mg (has no administration in time range)  remdesivir 200 mg in sodium chloride 0.9% 250 mL IVPB (has no administration in time range)    Followed by  remdesivir 100 mg in sodium chloride 0.9 % 100 mL IVPB (has no administration in time range)  methylPREDNISolone sodium succinate (SOLU-MEDROL) 40 mg/mL injection 39.2 mg (has no administration in time range)    Followed by  predniSONE (DELTASONE) tablet 50 mg (has no administration in time range)  ascorbic acid (VITAMIN C) tablet 500 mg (has no administration in time range)  zinc sulfate capsule 220 mg (has no administration in time range)  cefTRIAXone (ROCEPHIN) 1 g in sodium chloride 0.9 % 100 mL IVPB (has no administration in time range)  furosemide (LASIX) injection 80 mg (80 mg Intravenous Given 07/21/20 1314)     ED Discharge Orders    None       Note:  This document was prepared using Dragon  voice recognition software and may include unintentional dictation errors.   Lucrezia Starch, MD 07/21/20 1515

## 2020-07-21 NOTE — ED Notes (Signed)
Pt given belongings from family including cell phone. Pt denies any needs at this time.

## 2020-07-21 NOTE — Progress Notes (Signed)
Remdesivir - Pharmacy Brief Note   O:  ALT: 100 CXR: Bilateral pleural effusions and interstitial edema SpO2: 91% on 2 L [place]   A/P:  Remdesivir 200 mg IVPB once followed by 100 mg IVPB daily x 4 days.   Dorena Bodo, PharmD 07/21/2020 2:13 PM

## 2020-07-21 NOTE — H&P (Addendum)
History and Physical    Raven [name] IRJ:[id number] DOB: 29-Jun-1934 DOA: 07/21/2020  PCP: Baxter Hire, MD   Patient coming from: SNF  I have personally briefly reviewed patient's old medical records in [place]  Chief Complaint: Fever  HPI: Raven [name] is a 86 y.o. female with medical history significant for CAD, chronic systolic CHF, last EF 45 to 50% on 03/09/2020, steroid-dependent COPD on chronic O2 at 1 to 2 L, dementia, Crohn's disease and rheumatoid arthritis who was recently discharged from the hospital after management for acute on chronic systolic heart failure. She was sent to the emergency room by EMS from the skilled nursing facility where she resides for evaluation of fever and hypoxia.  Patient was also said to be more confused than her baseline and was unable to recognize her son this morning.  She received her first dose of Humira 1 day prior to admission. She had a temp of 100.5 F orally by EMS.  Patient states that she does not feel good but is unable to tell me what symptoms she has. I am unable to do review of systems due to her mental status. Labs show BNP greater than 4500, lactic acid 1.5, white count 9.2, hemoglobin 14.1, hematocrit 44.3, MCV 89.7, RDW 16.0, platelet count 116, PT 14.0, INR 1.1, sodium 141, potassium 4.9, chloride 102, bicarb 26, BUN 34, creatinine 1.14, calcium 8.5, alkaline phosphatase 352, albumin 3.4, AST 100, ALT 100, total protein 6.1, procalcitonin 0.13 Patient's SARS coronavirus 2 point-of-care test is positive Chest x-ray reviewed by me shows bilateral pleural effusions and interstitial edema. Unchanged. Right upper quadrant ultrasound is unremarkable. CT scan of the head without contrast shows no acute intracranial pathology. Small-vessel white matter disease. Twelve-lead EKG reviewed by me shows sinus rhythm with PACs   ED Course: Patient is an 86 year old Caucasian female who resides in a skilled nursing facility and was  sent to the emergency room for evaluation of a fever and hypoxia.  Patient had an oral temperature of 100.5 F.  She is currently on 3 L of oxygen above her baseline of 1 to 2 L to maintain pulse oximetry of 92%.  She has a chesty cough and chest x-ray shows bilateral pleural effusions.  She received a dose of remdesivir, IV Lasix and IV ciprofloxacin for UTI in the ER and will be admitted to the hospital for further evaluation.   Review of Systems: As per HPI otherwise all other systems reviewed and negative.    Past Medical History:  Diagnosis Date  . Anemia   . Arthritis   . Asthma   . CHF (congestive heart failure) ([place])   . Complication of anesthesia   . COPD (chronic obstructive pulmonary disease) ([place])   . Crohn's disease ([place])   . Depression    after death of husband  . Hyperlipidemia   . Hypertension   . Myocardial infarction ([place])   . Osteopenia   . Peptic ulcer disease   . Pneumonia   . PONV (postoperative nausea and vomiting)     Past Surgical History:  Procedure Laterality Date  . ABDOMINAL SURGERY    . CARDIAC CATHETERIZATION     with stent  . COLONOSCOPY WITH PROPOFOL N/A 09/27/2014   Procedure: COLONOSCOPY WITH PROPOFOL;  Surgeon: [name], MD;  Location: [facility] ENDOSCOPY;  Service: Gastroenterology;  Laterality: N/A;  . COLONOSCOPY WITH PROPOFOL N/A 06/21/2019   Procedure: COLONOSCOPY WITH PROPOFOL;  Surgeon: Lin Landsman, MD;  Location:  [facility] ENDOSCOPY;  Service: Gastroenterology;  Laterality: N/A;  . EYE SURGERY     bilateral cataract surgeries  . SHOULDER SURGERY Right    x 2   . SMALL INTESTINE SURGERY       reports that she quit smoking about 48 years ago. She has never used smokeless tobacco. She reports current alcohol use. She reports that she does not use drugs.  Allergies  Allergen Reactions  . Penicillins Other (See Comments)    Reaction:  Unknown  Has patient had a PCN reaction causing immediate rash, facial/tongue/throat swelling, SOB or  lightheadedness with hypotension: unknown Has patient had a PCN reaction causing severe rash involving mucus membranes or skin necrosis: unknown Has patient had a PCN reaction that required hospitalization No Has patient had a PCN reaction occurring within the last 10 years: No If all of the above answers are "NO", then may proceed with Cephalosporin use.   . Augmentin [Amoxicillin-Pot Clavulanate] Other (See Comments)    Reaction:  Unknown   . Indocin [Indomethacin] Other (See Comments)    Reaction:  Unknown   . Lodine [Etodolac] Other (See Comments)    Reaction:   GI upset   . Methotrexate Derivatives Other (See Comments)    Reaction:  GI upset   . Gold Rash  . Zantac [Ranitidine Hcl] Rash    Family History  Problem Relation Age of Onset  . Hypertension Other       Prior to Admission medications   Medication Sig Start Date End Date Taking? Authorizing Provider  acetaminophen (TYLENOL) 500 MG tablet Take 500-1,000 mg by mouth every 6 (six) hours as needed for mild pain, moderate pain or fever.    [provider]  Adalimumab 40 MG/0.8ML PSKT Inject 40 mg into the skin every 30 (thirty) days.     [provider]  albuterol (PROVENTIL HFA;VENTOLIN HFA) 108 (90 BASE) MCG/ACT inhaler Inhale 2 puffs into the lungs every 6 (six) hours as needed for wheezing or shortness of breath.    [provider]  allopurinol (ZYLOPRIM) 100 MG tablet Take 1 tablet (100 mg total) by mouth daily. 07/09/20 07/09/21  Loletha Grayer, MD  amitriptyline (ELAVIL) 10 MG tablet Take 20 mg by mouth at bedtime.    [provider]  ANORO ELLIPTA 62.5-25 MCG/INH AEPB Inhale 1 puff into the lungs daily. 05/29/19   [provider]  aspirin EC 81 MG tablet Take 81 mg by mouth daily.    [provider]  atorvastatin (LIPITOR) 80 MG tablet Take 80 mg by mouth daily.    [provider]  Calcium-Phosphorus-Vitamin D (CITRACAL +D3 PO) Take 1 tablet by mouth daily.     [provider]  chlorpheniramine-HYDROcodone (TUSSIONEX) 10-8 MG/5ML SUER Take 5 mLs by mouth every 12 (twelve) hours as needed (cough unrelieved with robitussen). 07/09/20   Loletha Grayer, MD  Cobalamin Combinations (VITAMIN B12-FOLIC ACID) 676-195 MCG TABS Take 1 tablet by mouth as directed.    [provider]  colchicine 0.6 MG tablet Take 1 tablet (0.6 mg total) by mouth daily. 07/10/20   Loletha Grayer, MD  dextromethorphan-guaiFENesin [facility] DM) 30-600 MG 12hr tablet Take 1 tablet by mouth 2 (two) times daily as needed for cough. 03/11/20   Nicole Kindred A, DO  ENTRESTO 24-26 MG TAKE ONE TABLET BY MOUTH TWICE DAILY Patient taking differently: Take 1 tablet by mouth 2 (two) times daily. 02/29/20   Alisa Graff, FNP  esomeprazole (NEXIUM) 40 MG capsule Take 40  mg by mouth daily. 05/29/19   [provider]  folic acid (FOLVITE) 454 MCG tablet Take 400 mcg by mouth daily.    [provider]  leflunomide (ARAVA) 10 MG tablet Take 10 mg by mouth daily. 03/29/19   [provider]  mesalamine (PENTASA) 500 MG CR capsule Take 500 mg by mouth 2 (two) times daily.    [provider]  metoprolol succinate (TOPROL-XL) 25 MG 24 hr tablet Take 3 tablets (75 mg total) by mouth daily. Take with or immediately following a meal. 07/10/20   Loletha Grayer, MD  Multiple Vitamin (MULTIVITAMIN WITH MINERALS) TABS tablet Take 1 tablet by mouth daily.    [provider]  ondansetron (ZOFRAN-ODT) 4 MG disintegrating tablet Take 4 mg by mouth every 12 (twelve) hours as needed for nausea/vomiting. 07/03/20   [provider]  potassium chloride (KLOR-CON) 10 MEQ tablet Take 10 mEq by mouth daily.    [provider]  predniSONE (DELTASONE) 5 MG tablet 6 tabs po day1,2; 5 tabs po day3,4; 4 tabs po day 5,6; 3 tabs po day7,8; 2 tabs po day9,10; 1.5 tabs daily afterwards 07/09/20   Loletha Grayer, MD  thiamine (VITAMIN B-1) 100 MG tablet Take  100 mg by mouth daily.    [provider]  torsemide (DEMADEX) 20 MG tablet Take 1 tablet (20 mg total) by mouth 2 (two) times daily. 07/09/20   Loletha Grayer, MD  traZODone (DESYREL) 50 MG tablet Take 1 tablet (50 mg total) by mouth at bedtime as needed for sleep. 07/09/20   Loletha Grayer, MD  vitamin E 400 UNIT capsule Take 400 Units by mouth daily.    [provider]    Physical Exam: Vitals:   07/21/20 1230 07/21/20 1330 07/21/20 1345 07/21/20 1400  BP: 139/79 122/65  126/66  Pulse:      Resp: (!) 24 (!) 26 (!) 21 20  Temp:      TempSrc:      SpO2:      Weight:      Height:         Vitals:   07/21/20 1230 07/21/20 1330 07/21/20 1345 07/21/20 1400  BP: 139/79 122/65  126/66  Pulse:      Resp: (!) 24 (!) 26 (!) 21 20  Temp:      TempSrc:      SpO2:      Weight:      Height:          Constitutional: Alert and oriented x 1.  Only to person. Not in any apparent distress HEENT:      Head: Normocephalic and atraumatic.         Eyes: PERLA, EOMI, Conjunctivae are normal. Sclera is non-icteric.       Mouth/Throat: Mucous membranes are moist.       Neck: Supple with no signs of meningismus. Cardiovascular: Regular rate and rhythm. No murmurs, gallops, or rubs. 2+ symmetrical distal pulses are present . No JVD. 2+ LE edema Respiratory:  Crackles in both lower lung fields bilaterally. No wheezes or rhonchi.  Gastrointestinal: Soft, suprapubic tenderness, and non distended with positive bowel sounds.  Central adiposity.  Bruising over anterior abdominal wall Genitourinary: No CVA tenderness. Musculoskeletal: Nontender with normal range of motion in all extremities. No cyanosis, or erythema of extremities. Neurologic:  Face is symmetric. Moving all extremities. No gross focal neurologic deficits  Skin: Skin is warm, dry.  No rash or ulcers Psychiatric: Depressed mood and flat affect  Labs on Admission: I have personally reviewed following labs and imaging  studies  CBC: Recent Labs  Lab 07/21/20 1207  WBC 9.2  NEUTROABS 7.3  HGB 14.1  HCT 44.3  MCV 89.7  PLT 536*   Basic Metabolic Panel: Recent Labs  Lab 07/21/20 1054  NA 141  K 4.1  CL 102  CO2 26  GLUCOSE 94  BUN 34*  CREATININE 1.14*  CALCIUM 8.5*   GFR: Estimated Creatinine Clearance: 38.7 mL/min (A) (by C-G formula based on SCr of 1.14 mg/dL (H)). Liver Function Tests: Recent Labs  Lab 07/21/20 1054  AST 100*  ALT 100*  ALKPHOS 352*  BILITOT 1.6*  PROT 6.1*  ALBUMIN 3.4*   No results for input(s): LIPASE, AMYLASE in the last 168 hours. No results for input(s): AMMONIA in the last 168 hours. Coagulation Profile: Recent Labs  Lab 07/21/20 1054 07/21/20 1207  INR 1.1 1.1   Cardiac Enzymes: No results for input(s): CKTOTAL, CKMB, CKMBINDEX, TROPONINI in the last 168 hours. BNP (last 3 results) No results for input(s): PROBNP in the last 8760 hours. HbA1C: No results for input(s): HGBA1C in the last 72 hours. CBG: No results for input(s): GLUCAP in the last 168 hours. Lipid Profile: No results for input(s): CHOL, HDL, LDLCALC, TRIG, CHOLHDL, LDLDIRECT in the last 72 hours. Thyroid Function Tests: No results for input(s): TSH, T4TOTAL, FREET4, T3FREE, THYROIDAB in the last 72 hours. Anemia Panel: No results for input(s): VITAMINB12, FOLATE, FERRITIN, TIBC, IRON, RETICCTPCT in the last 72 hours. Urine analysis:    Component Value Date/Time   COLORURINE YELLOW (A) 07/21/2020 1315   APPEARANCEUR CLEAR (A) 07/21/2020 1315   APPEARANCEUR Clear 06/08/2014 2052   LABSPEC 1.017 07/21/2020 1315   LABSPEC 1.015 06/08/2014 2052   PHURINE 5.0 07/21/2020 1315   GLUCOSEU NEGATIVE 07/21/2020 1315   GLUCOSEU Negative 06/08/2014 2052   HGBUR MODERATE (A) 07/21/2020 1315   [place] 07/21/2020 1315   BILIRUBINUR Negative 06/08/2014 2052   KETONESUR NEGATIVE 07/21/2020 [place] 07/21/2020 1315   NITRITE NEGATIVE 07/21/2020 1315    LEUKOCYTESUR MODERATE (A) 07/21/2020 1315   LEUKOCYTESUR Negative 06/08/2014 2052    Radiological Exams on Admission: CT Head Wo Contrast  Result Date: 07/21/2020 CLINICAL DATA:  Altered mental status EXAM: CT HEAD WITHOUT CONTRAST TECHNIQUE: Contiguous axial images were obtained from the base of the skull through the vertex without intravenous contrast. COMPARISON:  None. FINDINGS: Brain: No evidence of acute infarction, hemorrhage, hydrocephalus, extra-axial collection or mass lesion/mass effect. Mild periventricular white matter hypodensity. Vascular: No hyperdense vessel or unexpected calcification. Skull: Normal. Negative for fracture or focal lesion. Sinuses/Orbits: No acute finding. Other: None. IMPRESSION: No acute intracranial pathology. Small-vessel white matter disease. Electronically Signed   By: Eddie Candle M.D.   On: 07/21/2020 12:33   CT ENTERO ABD/PELVIS W CONTAST  Result Date: 07/21/2020 CLINICAL DATA:  Possible Crohn's disease, history of diverticulitis, appendectomy EXAM: CT ABDOMEN AND PELVIS WITH CONTRAST (ENTEROGRAPHY) TECHNIQUE: Multidetector CT of the abdomen and pelvis during bolus administration of intravenous contrast. Negative oral contrast was given. CONTRAST:  69m OMNIPAQUE IOHEXOL 300 MG/ML  SOLN COMPARISON:  03/08/2020 FINDINGS: Lower chest: Small right, trace left pleural effusions and associated atelectasis or consolidation. Elevation of the right hemidiaphragm. Cardiomegaly. Three-vessel coronary artery calcifications. Hepatobiliary: No focal liver abnormality is seen. Status post cholecystectomy. No biliary dilatation. Pancreas: Unremarkable. No pancreatic ductal dilatation or surrounding inflammatory changes. Spleen: Normal in size without significant abnormality. Adrenals/Urinary Tract: Adrenal glands are  unremarkable. Kidneys are normal, without renal calculi, solid lesion, or hydronephrosis. Bladder is unremarkable. Stomach/Bowel: Stomach is within normal limits.  Evidence of prior distal small bowel resection and reanastomosis (series 2, image 54). Appendix is surgically absent. Colonic diverticulosis. The colon is fluid-filled to the rectum. No evidence of bowel wall thickening, distention, or inflammatory changes. Vascular/Lymphatic: Aortic atherosclerosis. No enlarged abdominal or pelvic lymph nodes. Reproductive: No mass or other significant abnormality. Other: No abdominal wall hernia or abnormality. Anasarca. Small volume nonspecific free fluid in the low pelvis (series 2, image 80). Musculoskeletal: No acute or significant osseous findings. IMPRESSION: 1. No acute inflammatory findings of the bowel. No evidence of inflammatory bowel disease. 2. Evidence of prior distal small bowel resection and reanastomosis. 3. Colonic diverticulosis without evidence of acute diverticulitis. 4. The colon is fluid-filled to the rectum, suggestive of diarrhea. 5. Small volume nonspecific free fluid in the low pelvis. 6. Small right, trace left pleural effusions and associated atelectasis or consolidation. Anasarca. 7. Cardiomegaly and coronary artery disease. Aortic Atherosclerosis (ICD10-I70.0). Electronically Signed   By: Eddie Candle M.D.   On: 07/21/2020 12:39   DG Chest Port 1 View  Result Date: 07/21/2020 CLINICAL DATA:  evaluate for sepsis. EXAM: PORTABLE CHEST 1 VIEW COMPARISON:  07/08/2020 FINDINGS: Normal heart size. Unchanged asymmetric elevation of the right hemidiaphragm. Small pleural effusions are noted, right greater than left. Mild diffuse interstitial edema is stable to increased in the interval. IMPRESSION: Bilateral pleural effusions and interstitial edema.  Unchanged. Electronically Signed   By: Kerby Moors M.D.   On: 07/21/2020 11:20   US ABDOMEN LIMITED RUQ (LIVER/GB)  Result Date: 07/21/2020 CLINICAL DATA:  Transaminitis EXAM: ULTRASOUND ABDOMEN LIMITED RIGHT UPPER QUADRANT COMPARISON:  Abdominal ultrasound 06/13/2020 FINDINGS: Gallbladder: No  gallstones or wall thickening visualized. No sonographic Murphy sign noted by sonographer. Common bile duct: Diameter: 0.5 cm, within normal limits Liver: No focal lesion identified. Within normal limits in parenchymal echogenicity. Portal vein is patent on color Doppler imaging with normal direction of blood flow towards the liver. Other: None. IMPRESSION: Unremarkable sonographic exam of the right upper quadrant. Electronically Signed   By: Audie Pinto M.D.   On: 07/21/2020 12:25     Assessment/Plan Principal Problem:   Acute on chronic systolic CHF (congestive heart failure) (HCC) Active Problems:   COPD (chronic obstructive pulmonary disease) (HCC)   Depression with anxiety   HTN (hypertension)   Crohn's disease (HCC)   Chronic respiratory failure with hypoxia ([place])   COVID-19 virus detected   Acute lower UTI        Acute on chronic systolic CHF Patient sent to the emergency room for evaluation of fever and worsening hypoxia She has a history of chronic respiratory failure and is usually on 1 to 2 L of oxygen but currently requires 3 L to maintain pulse oximetry of 92% Chest x-ray shows bilateral pleural effusions, unchanged from her prior admission and BNP is elevated at greater than 4500 Place patient on Lasix 40 mg IV daily Continue Entresto and metoprolol Maintain low-sodium diet and daily weight   COVID-19 infection Patient sent to the emergency room from skilled nursing facility for evaluation of fever and hypoxia. She has an increased oxygen requirement and is currently on 3 L above her baseline of 1 to 2 L Unsure of patient's vaccination status We will place patient on remdesivir per protocol Place patient on systemic steroids Monitor inflammatory markers Supportive care with vitamins, antitussives and bronchodilator therapy   COPD, steroid-dependent  Chronic respiratory failure with hypoxia (HCC) Patient with increased oxygen requirement most likely  secondary to acute systolic CHF Continue as needed bronchodilator therapy as well as inhaled steroids     Crohn's disease ([place])   Rheumatoid arthritis ([place]) Patient on chronic immunosuppressive therapy and received her first dose of Humira 1 day prior to admission Continue leflunomide and mesalamine      Transaminitis Most likely medication induced Right upper quadrant ultrasound does not show any acute findings Hold statins for now    UTI Patient with significant pyuria We will treat empirically with Rocephin until urine culture results become available   DVT prophylaxis: Lovenox Code Status: full code Family Communication: Called and left patient's son Fina Heizer a voicemail, awaiting callback Disposition Plan: Back to previous home environment Consults called: none Status: At the time of admission, it appears that the appropriate admission status for this patient is inpatient. This is judged to be reasonable and necessary in order to provide the required intensity of service to ensure the patient's safety given the presenting symptoms, physical exam findings and initial radiographic and laboratory data in the context of conditions. Patient requires inpatient status due to high intensity of service, high risk for further deterioration and high frequency of surveillance required.    Collier Bullock MD [facility]     07/21/2020, 2:51 PM

## 2020-07-21 NOTE — ED Notes (Signed)
Pt given meal tray and sat up to eat.

## 2020-07-21 NOTE — ED Triage Notes (Addendum)
Pt comes ems with fever and "low oxygen" per facility. Pt's fingers very cold but pt 95% on chronic 2 [place] with EMS. Pt was unable to recognize her own son this morning but can answer basic questions. Pt 100.5 orally with EMS. Pt had FIRST humera injection yesterday.

## 2020-07-21 NOTE — ED Notes (Signed)
Pt given meal tray and ate about 40% of it.

## 2020-07-22 DIAGNOSIS — I5023 Acute on chronic systolic (congestive) heart failure: Secondary | ICD-10-CM | POA: Diagnosis not present

## 2020-07-22 LAB — URINE CULTURE

## 2020-07-22 LAB — COMPREHENSIVE METABOLIC PANEL
ALT: 83 U/L — ABNORMAL HIGH (ref 0–44)
AST: 85 U/L — ABNORMAL HIGH (ref 15–41)
Albumin: 2.8 g/dL — ABNORMAL LOW (ref 3.5–5.0)
Alkaline Phosphatase: 273 U/L — ABNORMAL HIGH (ref 38–126)
Anion gap: 11 (ref 5–15)
BUN: 33 mg/dL — ABNORMAL HIGH (ref 8–23)
CO2: 27 mmol/L (ref 22–32)
Calcium: 7.6 mg/dL — ABNORMAL LOW (ref 8.9–10.3)
Chloride: 106 mmol/L (ref 98–111)
Creatinine, Ser: 0.99 mg/dL (ref 0.44–1.00)
GFR, Estimated: 56 mL/min — ABNORMAL LOW (ref >60)
Glucose, Bld: 158 mg/dL — ABNORMAL HIGH (ref 70–99)
Potassium: 3.3 mmol/L — ABNORMAL LOW (ref 3.5–5.1)
Sodium: 144 mmol/L (ref 135–145)
Total Bilirubin: 0.9 mg/dL (ref 0.3–1.2)
Total Protein: 5.4 g/dL — ABNORMAL LOW (ref 6.5–8.1)

## 2020-07-22 LAB — CBC WITH DIFFERENTIAL/PLATELET
Abs Immature Granulocytes: 0.06 10*3/uL (ref 0.00–0.07)
Basophils Absolute: 0 10*3/uL (ref 0.0–0.1)
Basophils Relative: 0 %
Eosinophils Absolute: 0 10*3/uL (ref 0.0–0.5)
Eosinophils Relative: 0 %
HCT: 43.3 % (ref 36.0–46.0)
Hemoglobin: 13.9 g/dL (ref 12.0–15.0)
Immature Granulocytes: 1 %
Lymphocytes Relative: 5 %
Lymphs Abs: 0.4 10*3/uL — ABNORMAL LOW (ref 0.7–4.0)
MCH: 28.7 pg (ref 26.0–34.0)
MCHC: 32.1 g/dL (ref 30.0–36.0)
MCV: 89.3 fL (ref 80.0–100.0)
Monocytes Absolute: 0.4 10*3/uL (ref 0.1–1.0)
Monocytes Relative: 5 %
Neutro Abs: 6.7 10*3/uL (ref 1.7–7.7)
Neutrophils Relative %: 89 %
Platelets: 109 10*3/uL — ABNORMAL LOW (ref 150–400)
RBC: 4.85 MIL/uL (ref 3.87–5.11)
RDW: 16 % — ABNORMAL HIGH (ref 11.5–15.5)
WBC: 7.6 10*3/uL (ref 4.0–10.5)
nRBC: 0 % (ref 0.0–0.2)

## 2020-07-22 LAB — MAGNESIUM: Magnesium: 1.6 mg/dL — ABNORMAL LOW (ref 1.7–2.4)

## 2020-07-22 LAB — PHOSPHORUS: Phosphorus: 4.6 mg/dL (ref 2.5–4.6)

## 2020-07-22 LAB — D-DIMER, QUANTITATIVE: D-Dimer, Quant: 1.58 ug/mL-FEU — ABNORMAL HIGH (ref 0.00–0.50)

## 2020-07-22 LAB — C-REACTIVE PROTEIN: CRP: 19.4 mg/dL — ABNORMAL HIGH (ref <1.0)

## 2020-07-22 LAB — FERRITIN: Ferritin: 205 ng/mL (ref 11–307)

## 2020-07-22 MED ORDER — FUROSEMIDE 10 MG/ML IJ SOLN
40.0000 mg | Freq: Two times a day (BID) | INTRAMUSCULAR | Status: DC
  Administered 2020-07-22 – 2020-07-23 (×4): 40 mg via INTRAVENOUS
  Filled 2020-07-22 (×5): qty 4

## 2020-07-22 MED ORDER — MAGNESIUM SULFATE 2 GM/50ML IV SOLN
2.0000 g | Freq: Once | INTRAVENOUS | Status: AC
  Administered 2020-07-22: 2 g via INTRAVENOUS
  Filled 2020-07-22: qty 50

## 2020-07-22 MED ORDER — MAGNESIUM SULFATE 2 GM/50ML IV SOLN: 2.0000 g | Freq: Once | INTRAVENOUS | Status: DC

## 2020-07-22 MED ORDER — POTASSIUM CHLORIDE CRYS ER 20 MEQ PO TBCR
40.0000 meq | EXTENDED_RELEASE_TABLET | Freq: Once | ORAL | Status: AC
  Administered 2020-07-22: 40 meq via ORAL
  Filled 2020-07-22: qty 2

## 2020-07-22 NOTE — ED Notes (Signed)
Pt provided with lunch tray.

## 2020-07-22 NOTE — ED Notes (Signed)
Pt incontinent stool, 'it just slipped out,' pericare provided, linens changed

## 2020-07-22 NOTE — ED Notes (Signed)
Pt provided with breakfast tray. Resting at this time, states will eat later.

## 2020-07-22 NOTE — ED Notes (Signed)
Repositoned, no other needs at this time

## 2020-07-22 NOTE — ED Notes (Signed)
Pt awake, reports still cold, room temp is cold, additional warm blankets provided, pt will be moved to a room that has working [employer]

## 2020-07-22 NOTE — ED Notes (Signed)
Provided fresh water, assisted with turning to L side, assisted pt with calling son, no other needs at this time.

## 2020-07-22 NOTE — ED Notes (Signed)
Pt assisted with bedpan, medium stool

## 2020-07-22 NOTE — Progress Notes (Signed)
PROGRESS NOTE    Raven [name]  YPP:[id number] DOB: [birth year] DOA: 07/21/2020 PCP: Baxter Hire, MD    Brief Narrative:  Raven [name] is a 86 y.o. 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 She was sent to the emergency room by EMS from the skilled nursing facility where she resides for evaluation of fever and hypoxia.  Patient was also said to be more confused than her baseline and was unable to recognize her son this morning.  She received her first dose of Humira 1 day prior to admission. She had a temp of 100.5 F orally by EMS.  Patient states that she does not feel good but is unable to tell me what symptoms she has.COvid positive. Patient also in acute systolic HF. Also being tx for UTI  5/16-pt sitting in ER bed, somnolent, sleeping. Doesn't open eyes on sternal rub.     Consultants:     Procedures:   Antimicrobials:   Ceftriaxone  Remdesivir   Subjective: Unable to obtain information as patient is somnolent and sleeping  Objective: Vitals:   07/22/20 0745 07/22/20 0800 07/22/20 0815 07/22/20 0830  BP:      Pulse: 83 67 75 67  Resp: 15 (!) 21 15 16   Temp:      TempSrc:      SpO2: 93% 93%    Weight:      Height:        Intake/Output Summary (Last 24 hours) at 07/22/2020 4580 Last data filed at 07/22/2020 0220 Gross per 24 hour  Intake --  Output 1050 ml  Net -1050 ml   Filed Weights   07/21/20 1042  Weight: 78 kg    Examination:  General exam: Sleepy, somnolent will not participate or answer my questions or open eyes Respiratory system: Crackles bilaterally no wheezing anteriorly Cardiovascular system: S1 & S2 heard, RRR.  No gallops  gastrointestinal system: Abdomen is  nondistended, soft and nontender. . Normal bowel sounds heard. Central nervous system: Unable to assess  Extremities: 2+ pitting edema Skin: Warm dry Psychiatry: Unable to assess  Data Reviewed: I have personally reviewed following labs and imaging studies  CBC: Recent Labs  Lab 07/21/20 1207 07/22/20 0419  WBC 9.2 7.6  NEUTROABS 7.3 6.7  HGB 14.1 13.9  HCT 44.3 43.3  MCV 89.7 89.3  PLT 116* 998*   Basic Metabolic Panel: Recent Labs  Lab 07/21/20 1054 07/22/20 0419  NA 141 144  K 4.1 3.3*  CL 102 106  CO2 26 27  GLUCOSE 94 158*  BUN 34* 33*  CREATININE 1.14* 0.99  CALCIUM 8.5* 7.6*  MG  --  1.6*  PHOS  --  4.6   GFR: Estimated Creatinine Clearance: 44.6 mL/min (by C-G formula based on SCr of 0.99 mg/dL). Liver Function Tests: Recent Labs  Lab 07/21/20 1054 07/22/20 0419  AST 100* 85*  ALT 100* 83*  ALKPHOS 352* 273*  BILITOT 1.6* 0.9  PROT 6.1* 5.4*  ALBUMIN 3.4* 2.8*   No results for input(s): LIPASE, AMYLASE in the last 168 hours. No results for input(s): AMMONIA in the last 168 hours. Coagulation Profile: Recent Labs  Lab 07/21/20 1054 07/21/20 1207  INR 1.1 1.1   Cardiac Enzymes: No results for input(s): CKTOTAL, CKMB,  CKMBINDEX, TROPONINI in the last 168 hours. BNP (last 3 results) No results for input(s): PROBNP in the last 8760 hours. HbA1C: No results for input(s): HGBA1C in the last 72 hours. CBG: No results for input(s): GLUCAP in the last 168 hours. Lipid Profile: No results for input(s): CHOL, HDL, LDLCALC, TRIG, CHOLHDL, LDLDIRECT in the last 72 hours. Thyroid Function Tests: No results for input(s): TSH, T4TOTAL, FREET4, T3FREE, THYROIDAB in the last 72 hours. Anemia Panel: Recent Labs    07/22/20 0419  FERRITIN 205   Sepsis Labs: Recent Labs  Lab 07/21/20 1054 07/21/20 1207  PROCALCITON 0.13  --   LATICACIDVEN 1.5 1.5    Recent Results (from the past 240 hour(s))  Resp Panel by RT-PCR (Flu A&B, Covid) Nasopharyngeal  Swab     Status: Abnormal   Collection Time: 07/21/20 10:54 AM   Specimen: Nasopharyngeal Swab; Nasopharyngeal(NP) swabs in vial transport medium  Result Value Ref Range Status   SARS Coronavirus 2 by RT PCR POSITIVE (A) NEGATIVE Final    Comment: RESULT CALLED TO, READ BACK BY AND VERIFIED WITH: JESSICA COLTRANE AT 9935 07/21/20.PMF (NOTE) SARS-CoV-2 target nucleic acids are DETECTED.  The SARS-CoV-2 RNA is generally detectable in upper respiratory specimens during the acute phase of infection. Positive results are indicative of the presence of the identified virus, but do not rule out bacterial infection or co-infection with other pathogens not detected by the test. Clinical correlation with patient history and other diagnostic information is necessary to determine patient infection status. The expected result is Negative.  Fact Sheet for Patients: EntrepreneurPulse.com.au  Fact Sheet for Healthcare Providers: IncredibleEmployment.be  This test is not yet approved or cleared by the [place] FDA and  has been authorized for detection and/or diagnosis of SARS-CoV-2 by FDA under an Emergency Use Authorization (EUA).  This EUA will remain in effect (meaning this test can  be used) for the duration of  the COVID-19 declaration under Section 564(b)(1) of the Act, 21 U.S.C. section 360bbb-3(b)(1), unless the authorization is terminated or revoked sooner.     Influenza A by PCR NEGATIVE NEGATIVE Final   Influenza B by PCR NEGATIVE NEGATIVE Final    Comment: (NOTE) The Xpert Xpress SARS-CoV-2/FLU/RSV plus assay is intended as an aid in the diagnosis of influenza from Nasopharyngeal swab specimens and should not be used as a sole basis for treatment. Nasal washings and aspirates are unacceptable for Xpert Xpress SARS-CoV-2/FLU/RSV testing.  Fact Sheet for Patients: EntrepreneurPulse.com.au  Fact Sheet for Healthcare  Providers: IncredibleEmployment.be  This test is not yet approved or cleared by the [place] FDA and has been authorized for detection and/or diagnosis of SARS-CoV-2 by FDA under an Emergency Use Authorization (EUA). This EUA will remain in effect (meaning this test can be used) for the duration of the COVID-19 declaration under Section 564(b)(1) of the Act, 21 U.S.C. section 360bbb-3(b)(1), unless the authorization is terminated or revoked.  Performed at [facility], [address] [place], [place] [address]   Blood culture (routine single)     Status: None (Preliminary result)   Collection Time: 07/21/20 10:54 AM   Specimen: BLOOD  Result Value Ref Range Status   Specimen Description BLOOD RIGHT ANTECUBITAL  Final   Special Requests   Final    BOTTLES DRAWN AEROBIC AND ANAEROBIC Blood Culture results may not be optimal due to an inadequate volume of blood received in culture bottles   Culture   Final    NO GROWTH < 24 HOURS  Performed at [facility], [address] [place], [place] [address]    Report Status PENDING  Incomplete         Radiology Studies: CT Head Wo Contrast  Result Date: 07/21/2020 CLINICAL DATA:  Altered mental status EXAM: CT HEAD WITHOUT CONTRAST TECHNIQUE: Contiguous axial images were obtained from the base of the skull through the vertex without intravenous contrast. COMPARISON:  None. FINDINGS: Brain: No evidence of acute infarction, hemorrhage, hydrocephalus, extra-axial collection or mass lesion/mass effect. Mild periventricular white matter hypodensity. Vascular: No hyperdense vessel or unexpected calcification. Skull: Normal. Negative for fracture or focal lesion. Sinuses/Orbits: No acute finding. Other: None. IMPRESSION: No acute intracranial pathology. Small-vessel white matter disease. Electronically Signed   By: Eddie Candle M.D.   On: 07/21/2020 12:33   DG Chest Port 1 View  Result Date:  07/21/2020 CLINICAL DATA:  evaluate for sepsis. EXAM: PORTABLE CHEST 1 VIEW COMPARISON:  07/08/2020 FINDINGS: Normal heart size. Unchanged asymmetric elevation of the right hemidiaphragm. Small pleural effusions are noted, right greater than left. Mild diffuse interstitial edema is stable to increased in the interval. IMPRESSION: Bilateral pleural effusions and interstitial edema.  Unchanged. Electronically Signed   By: Kerby Moors M.D.   On: 07/21/2020 11:20   US ABDOMEN LIMITED RUQ (LIVER/GB)  Result Date: 07/21/2020 CLINICAL DATA:  Transaminitis EXAM: ULTRASOUND ABDOMEN LIMITED RIGHT UPPER QUADRANT COMPARISON:  Abdominal ultrasound 06/13/2020 FINDINGS: Gallbladder: No gallstones or wall thickening visualized. No sonographic Murphy sign noted by sonographer. Common bile duct: Diameter: 0.5 cm, within normal limits Liver: No focal lesion identified. Within normal limits in parenchymal echogenicity. Portal vein is patent on color Doppler imaging with normal direction of blood flow towards the liver. Other: None. IMPRESSION: Unremarkable sonographic exam of the right upper quadrant. Electronically Signed   By: Audie Pinto M.D.   On: 07/21/2020 12:25        Scheduled Meds: . allopurinol  100 mg Oral Daily  . amitriptyline  20 mg Oral QHS  . vitamin C  500 mg Oral Daily  . aspirin EC  81 mg Oral Daily  . colchicine  0.6 mg Oral Daily  . enoxaparin (LOVENOX) injection  40 mg Subcutaneous Q24H  . folic acid  767 mcg Oral Daily  . furosemide  40 mg Intravenous Daily  . leflunomide  10 mg Oral Daily  . mesalamine  500 mg Oral BID  . methylPREDNISolone (SOLU-MEDROL) injection  0.5 mg/kg Intravenous Q12H   Followed by  . [START ON 07/24/2020] predniSONE  50 mg Oral Daily  . metoprolol succinate  75 mg Oral Daily  . multivitamin with minerals  1 tablet Oral Daily  . pantoprazole  40 mg Oral Daily  . potassium chloride  10 mEq Oral Daily  . sacubitril-valsartan  1 tablet Oral BID  . sodium  chloride flush  3 mL Intravenous Q12H  . thiamine  100 mg Oral Daily  . umeclidinium-vilanterol  1 puff Inhalation Daily  . vitamin B-12  500 mcg Oral Daily  . zinc sulfate  220 mg Oral Daily   Continuous Infusions: . sodium chloride    . cefTRIAXone (ROCEPHIN)  IV Stopped (07/21/20 1732)  . remdesivir 100 mg in NS 100 mL      Assessment & Plan:   Principal Problem:   Acute on chronic systolic CHF (congestive heart failure) (HCC) Active Problems:   COPD (chronic obstructive pulmonary disease) (HCC)   Depression with anxiety   Transaminitis   HTN (hypertension)   Crohn's  disease ([place])   Chronic respiratory failure with hypoxia ([place])   COVID-19 virus detected   Acute lower UTI   Acute on chronic systolic CHF Patient sent to the emergency room for evaluation of fever and worsening hypoxia She has a history of chronic respiratory failure and is usually on 1 to 2 L of oxygen but currently requires 3 L to maintain pulse oximetry of 92% Chest x-ray shows bilateral pleural effusions,interstitial edema BNP elevated 5/16-will start lasix 7m iv bid I/o Daily weight Continue entresto and metoprolol    Acute on chronic  respiratory failure with hypoxia-patient on 1 to 2 L oxygen at all times at home, required 3 L here sating 91-93% Likely multifactorial including from COVID-19 infection and acute on chronic systolic heart failure Treat CHF with Lasix Treat COVID infection as below Keep O2 supplementation to keep O2 sats above 92%    COVID-19 infection Patient sent to the emergency room from skilled nursing facility for evaluation of fever and hypoxia. Unsure of patient's vaccination status 5/16 continue remdesivir  Continue steroids IV  Monitor inflammatory markers  Continue vitamins, antitussive and bronchodilators  I-S and flutter valve when awake   COPD, steroid-dependent Chronic respiratory failure with hypoxia ([place] Patient with increased oxygen requirement most  likely secondary to acute systolic heart failure and COVID infection  Continue bronchodilator therapy      Crohn's disease ([place] Rheumatoid arthritis ([place] Patient on chronic immunosuppressive therapy and received her first dose of Humira 1 day prior to admission Continue leflunomide and mesalamine      Transaminitis Possibly likely medication induced versus CHF/congestion or both Right upper quadrant ultrasound does not show any acute findings LFTs trending down we will continue to monitor    UTI Patient with significant pyuria Treating empirically with Rocephin  Urine culture with multiple species       DVT prophylaxis: Lovenox Code Status: Full Family Communication: Son updated Status is: Inpatient  Remains inpatient appropriate because:Inpatient level of care appropriate due to severity of illness   Dispo: The patient is from: SNF              Anticipated d/c is to: SNF              Patient currently is not medically stable to d/c.   Difficult to place patient No            LOS: 1 day   Time spent: 45 minutes with more than 50% on [place] MD [facility] Pager [phone]  If 7PM-7AM, please contact night-coverage 07/22/2020, 8:38 AM

## 2020-07-23 DIAGNOSIS — I5023 Acute on chronic systolic (congestive) heart failure: Secondary | ICD-10-CM

## 2020-07-23 LAB — CBC WITH DIFFERENTIAL/PLATELET
Abs Immature Granulocytes: 0.06 10*3/uL (ref 0.00–0.07)
Basophils Absolute: 0 10*3/uL (ref 0.0–0.1)
Basophils Relative: 0 %
Eosinophils Absolute: 0 10*3/uL (ref 0.0–0.5)
Eosinophils Relative: 0 %
HCT: 41.6 % (ref 36.0–46.0)
Hemoglobin: 13.3 g/dL (ref 12.0–15.0)
Immature Granulocytes: 1 %
Lymphocytes Relative: 10 %
Lymphs Abs: 0.5 10*3/uL — ABNORMAL LOW (ref 0.7–4.0)
MCH: 28.6 pg (ref 26.0–34.0)
MCHC: 32 g/dL (ref 30.0–36.0)
MCV: 89.5 fL (ref 80.0–100.0)
Monocytes Absolute: 0.6 10*3/uL (ref 0.1–1.0)
Monocytes Relative: 10 %
Neutro Abs: 4.4 10*3/uL (ref 1.7–7.7)
Neutrophils Relative %: 79 %
Platelets: 125 10*3/uL — ABNORMAL LOW (ref 150–400)
RBC: 4.65 MIL/uL (ref 3.87–5.11)
RDW: 16.1 % — ABNORMAL HIGH (ref 11.5–15.5)
WBC: 5.6 10*3/uL (ref 4.0–10.5)
nRBC: 0 % (ref 0.0–0.2)

## 2020-07-23 LAB — COMPREHENSIVE METABOLIC PANEL
ALT: 89 U/L — ABNORMAL HIGH (ref 0–44)
AST: 91 U/L — ABNORMAL HIGH (ref 15–41)
Albumin: 2.6 g/dL — ABNORMAL LOW (ref 3.5–5.0)
Alkaline Phosphatase: 245 U/L — ABNORMAL HIGH (ref 38–126)
Anion gap: 11 (ref 5–15)
BUN: 42 mg/dL — ABNORMAL HIGH (ref 8–23)
CO2: 25 mmol/L (ref 22–32)
Calcium: 7.3 mg/dL — ABNORMAL LOW (ref 8.9–10.3)
Chloride: 105 mmol/L (ref 98–111)
Creatinine, Ser: 0.91 mg/dL (ref 0.44–1.00)
GFR, Estimated: >60 mL/min (ref >60)
Glucose, Bld: 203 mg/dL — ABNORMAL HIGH (ref 70–99)
Potassium: 4.1 mmol/L (ref 3.5–5.1)
Sodium: 141 mmol/L (ref 135–145)
Total Bilirubin: 0.7 mg/dL (ref 0.3–1.2)
Total Protein: 5.1 g/dL — ABNORMAL LOW (ref 6.5–8.1)

## 2020-07-23 LAB — C DIFFICILE QUICK SCREEN W PCR REFLEX
C Diff antigen: NEGATIVE
C Diff interpretation: NOT DETECTED
C Diff toxin: NEGATIVE

## 2020-07-23 LAB — GLUCOSE, CAPILLARY: Glucose-Capillary: 280 mg/dL — ABNORMAL HIGH (ref 70–99)

## 2020-07-23 LAB — D-DIMER, QUANTITATIVE: D-Dimer, Quant: 1.14 ug/mL-FEU — ABNORMAL HIGH (ref 0.00–0.50)

## 2020-07-23 LAB — MAGNESIUM: Magnesium: 2 mg/dL (ref 1.7–2.4)

## 2020-07-23 LAB — FERRITIN: Ferritin: 204 ng/mL (ref 11–307)

## 2020-07-23 LAB — C-REACTIVE PROTEIN: CRP: 14.3 mg/dL — ABNORMAL HIGH (ref <1.0)

## 2020-07-23 LAB — PHOSPHORUS: Phosphorus: 4.2 mg/dL (ref 2.5–4.6)

## 2020-07-23 NOTE — TOC Initial Note (Signed)
Transition of Care [facility]) - Initial/Assessment Note    Patient Details  Name: Raven [name] MRN: [id number] Date of Birth: Sep 14, 1934  Transition of Care [facility]) CM/SW Contact:    Alberteen Sam, LCSW Phone Number: 07/23/2020, 10:02 AM  Clinical Narrative:                  CSW spoke with patient's son Kasandra Knudsen via phone, he reports he lives in [place] area and other son Alvester Chou lives in [place] and had arrived to hospital with patient upon admittance. Kasandra Knudsen reports patient is from [facility], reports she was there for 10 days for short term rehab and that [facility] is holding her bed when patient is medically stable to return.   Discharge plan for patient to return to [facility] when medically stable.   Expected Discharge Plan: Skilled Nursing Facility Barriers to Discharge: Continued Medical Work up   Patient Goals and CMS Choice   CMS Medicare.gov Compare Post Acute Care list provided to:: Patient Represenative (must comment) (son Kasandra Knudsen) Choice offered to / list presented to : Adult Children  Expected Discharge Plan and Services Expected Discharge Plan: [place] arrangements for the past 2 months: [place] [employer])                                      Prior Living Arrangements/Services Living arrangements for the past 2 months: [place] Oceanographer) Lives with:: Self Patient language and need for interpreter reviewed:: Yes Do you feel safe going back to the place where you live?: Yes      Need for Family Participation in Patient Care: Yes (Comment) Care giver support system in place?: Yes (comment)   Criminal Activity/Legal Involvement Pertinent to Current Situation/Hospitalization: No - Comment as needed  Activities of Daily Living Home Assistive Devices/Equipment: Walker (specify type) ADL Screening (condition at time of admission) Patient's cognitive ability  adequate to safely complete daily activities?: No Is the patient deaf or have difficulty hearing?: No Does the patient have difficulty seeing, even when wearing glasses/contacts?: No Does the patient have difficulty concentrating, remembering, or making decisions?: No Patient able to express need for assistance with ADLs?: No Does the patient have difficulty dressing or bathing?: No Independently performs ADLs?: Yes (appropriate for developmental age) Does the patient have difficulty walking or climbing stairs?: No Weakness of Legs: None Weakness of Arms/Hands: None  Permission Sought/Granted Permission sought to share information with : Case Manager,Facility Contact Representative,Family Supports Permission granted to share information with : Yes, Verbal Permission Granted  Share Information with NAME: Kasandra Knudsen  Permission granted to share info w AGENCY: SNFs  Permission granted to share info w Relationship: son  Permission granted to share info w Contact Information: [phone]  Emotional Assessment              Admission diagnosis:  Transaminitis [R74.01] Elevated brain natriuretic peptide (BNP) level [R79.89] Acute cystitis with hematuria [N30.01] Acute on chronic systolic CHF (congestive heart failure) (HCC) [I50.23] Acute on chronic respiratory failure with hypoxia ([place]) [J96.21] Acute on chronic congestive heart failure, unspecified heart failure type ([place]) [I50.9] COVID [U07.1] Patient Active Problem List   Diagnosis Date Noted  . COVID-19 virus detected 07/21/2020  . Acute lower UTI 07/21/2020  . SVT (supraventricular tachycardia) ([place])   . Acute gout of left ankle   .  Acute pain of left shoulder   . Type 2 diabetes mellitus with hyperlipidemia ([place])   . Acute kidney injury superimposed on CKD ([place]) 07/04/2020  . Dizziness 07/04/2020  . Dyspnea 03/09/2020  . Nausea vomiting and diarrhea 03/08/2020  . Hyperkalemia   . Hypomagnesemia   . Pain in both lower  extremities   . Generalized weakness   . Rheumatoid arthritis ([place])   . Acute on chronic systolic CHF (congestive heart failure) ([place]) 11/22/2019  . Acute on chronic systolic (congestive) heart failure ([place]) 11/22/2019  . Chronic respiratory failure with hypoxia ([place]) 11/22/2019  . HLD (hyperlipidemia) 08/25/2019  . HTN (hypertension) 08/25/2019  . Depression with anxiety   . CAD (coronary artery disease)   . Transaminitis   . Hypokalemia   . Crohn's disease ([place])   . Acute on chronic respiratory failure with hypoxia ([place])   . Steroid-dependent COPD ([place])   . SBO (small bowel obstruction) ([place]) 06/17/2019  . Crohn's disease of small intestine with intestinal obstruction ([place])   . COPD with acute exacerbation ([place]) 10/20/2018  . COPD (chronic obstructive pulmonary disease) ([place]) 04/22/2016  . Acute bronchitis 04/17/2016  . Sepsis ([place] ) 04/17/2016  . Peroneal tendonitis 07/18/2015  . Pneumonia 04/19/2015  . CAP (community acquired pneumonia) 11/02/2014   PCP:  Baxter Hire, MD Pharmacy:   [place], [place] - [place] [address] [place] [address] Phone: [phone] Fax: [phone]  [place], [place] - [place] [address] Phone: [phone] Fax: [phone]  [id number] [place], [place] - [place] [address] [place] [address] Phone: [phone] Fax: [phone]     Social Determinants of Health ([place]) Interventions    Readmission Risk Interventions Readmission Risk Prevention Plan 03/11/2020 11/25/2019  Transportation Screening Complete Complete  Medication Review Press photographer) Complete Complete  PCP or Specialist appointment within 3-5 days of discharge Complete Complete  HRI or Home Care Consult Complete Complete  SW Recovery Care/Counseling Consult Complete Complete  Palliative Care Screening  Not Applicable Not [place] Complete Not Applicable  Some recent data might be hidden

## 2020-07-23 NOTE — Progress Notes (Signed)
Pt had lasix 64m last night at 2200, She has not urinated since then. Bladder scan is 348 ml at 0500, RN try to convince her to do a in and out cath, but pt refused. RN educated pt the side effect of urine retention. Pt would like to try to urinate by herself. Documented in flowsheet and Dr HN[place] Cranenotified. Will keep monitoring.

## 2020-07-23 NOTE — Plan of Care (Signed)
  Problem: Education: Goal: Knowledge of General Education information will improve Description Including pain rating scale, medication(s)/side effects and non-pharmacologic comfort measures Outcome: Progressing   Problem: Health Behavior/Discharge Planning: Goal: Ability to manage health-related needs will improve Outcome: Progressing   

## 2020-07-23 NOTE — Progress Notes (Signed)
PROGRESS NOTE    Raven [name]  RFF:[id number] DOB: 03/17/1934 DOA: 07/21/2020 PCP: Baxter Hire, MD    Brief Narrative:  Raven [name] is a 86 y.o. female with medical history significant forCAD, chronic systolic CHF, last EF 40 to 50% on 03/09/2020, steroid-dependent COPD on chronic O2 at 1 to 2 L, dementia, Crohn's disease and rheumatoid arthritis who was recently discharged from the hospital after management for acute on chronic systolic heart failure. She was sent to the emergency room by EMS from the skilled nursing facility where she resides for evaluation of fever and hypoxia.  Patient was also said to be more confused than her baseline and was unable to recognize her son this morning.  She received her first dose of Humira 1 day prior to admission. She had a temp of 100.5 F orally by EMS.  Patient states that she does not feel good but is unable to tell me what symptoms she has.COvid positive. Patient also in acute systolic HF. Also being tx for UTI  5/16-pt sitting in ER bed, somnolent, sleeping. Doesn't open eyes on sternal rub.  5/17-awake this am, communicative, states feels better. Less sob, feels LE edema going down.    Consultants:     Procedures:   Antimicrobials:   Ceftriaxone  Remdesivir   Subjective:  as above  Objective: Vitals:   07/23/20 0257 07/23/20 0258 07/23/20 0259 07/23/20 0304  BP:    119/81  Pulse: (!) 55 (!) 37 (!) 37 82  Resp: (!) 21 18 20 17   Temp:    98 F (36.7 C)  TempSrc:    Oral  SpO2: 95% 95% 96% 98%  Weight:    75.4 kg  Height:        Intake/Output Summary (Last 24 hours) at 07/23/2020 0859 Last data filed at 07/23/2020 0200 Gross per 24 hour  Intake 480 ml  Output --  Net 480 ml   Filed Weights   07/21/20 1042 07/23/20 0304  Weight: 78 kg 75.4 kg    Examination: nad , sitting in chair Positive crackles bilaterally, no wheezing Regular S1-S2 no gallops Soft benign positive bowel sounds Positive pitting edema  but less than yesterday Awake and alert x3 grossly intact Mood and affect appropriate in current setting  Data Reviewed: I have personally reviewed following labs and imaging studies  CBC: Recent Labs  Lab 07/21/20 1207 07/22/20 0419 07/23/20 0521  WBC 9.2 7.6 5.6  NEUTROABS 7.3 6.7 4.4  HGB 14.1 13.9 13.3  HCT 44.3 43.3 41.6  MCV 89.7 89.3 89.5  PLT 116* 109* 357*   Basic Metabolic Panel: Recent Labs  Lab 07/21/20 1054 07/22/20 0419 07/23/20 0521  NA 141 144 141  K 4.1 3.3* 4.1  CL 102 106 105  CO2 26 27 25   GLUCOSE 94 158* 203*  BUN 34* 33* 42*  CREATININE 1.14* 0.99 0.91  CALCIUM 8.5* 7.6* 7.3*  MG  --  1.6* 2.0  PHOS  --  4.6 4.2   GFR: Estimated Creatinine Clearance: 47.7 mL/min (by C-G formula based on SCr of 0.91 mg/dL). Liver Function Tests: Recent Labs  Lab 07/21/20 1054 07/22/20 0419 07/23/20 0521  AST 100* 85* 91*  ALT 100* 83* 89*  ALKPHOS 352* 273* 245*  BILITOT 1.6* 0.9 0.7  PROT 6.1* 5.4* 5.1*  ALBUMIN 3.4* 2.8* 2.6*   No results for input(s): LIPASE, AMYLASE in the last 168 hours. No results for input(s): AMMONIA in the last 168 hours. Coagulation Profile: Recent  Labs  Lab 07/21/20 1054 07/21/20 1207  INR 1.1 1.1   Cardiac Enzymes: No results for input(s): CKTOTAL, CKMB, CKMBINDEX, TROPONINI in the last 168 hours. BNP (last 3 results) No results for input(s): PROBNP in the last 8760 hours. HbA1C: No results for input(s): HGBA1C in the last 72 hours. CBG: No results for input(s): GLUCAP in the last 168 hours. Lipid Profile: No results for input(s): CHOL, HDL, LDLCALC, TRIG, CHOLHDL, LDLDIRECT in the last 72 hours. Thyroid Function Tests: No results for input(s): TSH, T4TOTAL, FREET4, T3FREE, THYROIDAB in the last 72 hours. Anemia Panel: Recent Labs    07/22/20 0419 07/23/20 0521  FERRITIN 205 204   Sepsis Labs: Recent Labs  Lab 07/21/20 1054 07/21/20 1207  PROCALCITON 0.13  --   LATICACIDVEN 1.5 1.5    Recent  Results (from the past 240 hour(s))  Resp Panel by RT-PCR (Flu A&B, Covid) Nasopharyngeal Swab     Status: Abnormal   Collection Time: 07/21/20 10:54 AM   Specimen: Nasopharyngeal Swab; Nasopharyngeal(NP) swabs in vial transport medium  Result Value Ref Range Status   SARS Coronavirus 2 by RT PCR POSITIVE (A) NEGATIVE Final    Comment: RESULT CALLED TO, READ BACK BY AND VERIFIED WITH: JESSICA COLTRANE AT 6301 07/21/20.PMF (NOTE) SARS-CoV-2 target nucleic acids are DETECTED.  The SARS-CoV-2 RNA is generally detectable in upper respiratory specimens during the acute phase of infection. Positive results are indicative of the presence of the identified virus, but do not rule out bacterial infection or co-infection with other pathogens not detected by the test. Clinical correlation with patient history and other diagnostic information is necessary to determine patient infection status. The expected result is Negative.  Fact Sheet for Patients: EntrepreneurPulse.com.au  Fact Sheet for Healthcare Providers: IncredibleEmployment.be  This test is not yet approved or cleared by the [place] FDA and  has been authorized for detection and/or diagnosis of SARS-CoV-2 by FDA under an Emergency Use Authorization (EUA).  This EUA will remain in effect (meaning this test can  be used) for the duration of  the COVID-19 declaration under Section 564(b)(1) of the Act, 21 U.S.C. section 360bbb-3(b)(1), unless the authorization is terminated or revoked sooner.     Influenza A by PCR NEGATIVE NEGATIVE Final   Influenza B by PCR NEGATIVE NEGATIVE Final    Comment: (NOTE) The Xpert Xpress SARS-CoV-2/FLU/RSV plus assay is intended as an aid in the diagnosis of influenza from Nasopharyngeal swab specimens and should not be used as a sole basis for treatment. Nasal washings and aspirates are unacceptable for Xpert Xpress SARS-CoV-2/FLU/RSV testing.  Fact Sheet for  Patients: EntrepreneurPulse.com.au  Fact Sheet for Healthcare Providers: IncredibleEmployment.be  This test is not yet approved or cleared by the [place] FDA and has been authorized for detection and/or diagnosis of SARS-CoV-2 by FDA under an Emergency Use Authorization (EUA). This EUA will remain in effect (meaning this test can be used) for the duration of the COVID-19 declaration under Section 564(b)(1) of the Act, 21 U.S.C. section 360bbb-3(b)(1), unless the authorization is terminated or revoked.  Performed at [facility], [address] [place], [place] [address]   Blood culture (routine single)     Status: None (Preliminary result)   Collection Time: 07/21/20 10:54 AM   Specimen: BLOOD  Result Value Ref Range Status   Specimen Description BLOOD RIGHT ANTECUBITAL  Final   Special Requests   Final    BOTTLES DRAWN AEROBIC AND ANAEROBIC Blood Culture results may not be optimal due  to an inadequate volume of blood received in culture bottles   Culture   Final    NO GROWTH < 24 HOURS Performed at [facility], [address] [place], [place] [address]    Report Status PENDING  Incomplete  Urine culture     Status: Abnormal   Collection Time: 07/21/20  1:15 PM   Specimen: Urine, Random  Result Value Ref Range Status   Specimen Description   Final    URINE, RANDOM Performed at [facility], [address], [place], [place] [address]    Special Requests   Final    NONE Performed at [facility], [address] [place], [place] [address]    Culture MULTIPLE SPECIES PRESENT, SUGGEST RECOLLECTION (A)  Final   Report Status 07/22/2020 FINAL  Final         Radiology Studies: CT Head Wo Contrast  Result Date: 07/21/2020 CLINICAL DATA:  Altered mental status EXAM: CT HEAD WITHOUT CONTRAST TECHNIQUE: Contiguous axial images were obtained from the base of the skull through the vertex without  intravenous contrast. COMPARISON:  None. FINDINGS: Brain: No evidence of acute infarction, hemorrhage, hydrocephalus, extra-axial collection or mass lesion/mass effect. Mild periventricular white matter hypodensity. Vascular: No hyperdense vessel or unexpected calcification. Skull: Normal. Negative for fracture or focal lesion. Sinuses/Orbits: No acute finding. Other: None. IMPRESSION: No acute intracranial pathology. Small-vessel white matter disease. Electronically Signed   By: Eddie Candle M.D.   On: 07/21/2020 12:33   DG Chest Port 1 View  Result Date: 07/21/2020 CLINICAL DATA:  evaluate for sepsis. EXAM: PORTABLE CHEST 1 VIEW COMPARISON:  07/08/2020 FINDINGS: Normal heart size. Unchanged asymmetric elevation of the right hemidiaphragm. Small pleural effusions are noted, right greater than left. Mild diffuse interstitial edema is stable to increased in the interval. IMPRESSION: Bilateral pleural effusions and interstitial edema.  Unchanged. Electronically Signed   By: Kerby Moors M.D.   On: 07/21/2020 11:20   US ABDOMEN LIMITED RUQ (LIVER/GB)  Result Date: 07/21/2020 CLINICAL DATA:  Transaminitis EXAM: ULTRASOUND ABDOMEN LIMITED RIGHT UPPER QUADRANT COMPARISON:  Abdominal ultrasound 06/13/2020 FINDINGS: Gallbladder: No gallstones or wall thickening visualized. No sonographic Murphy sign noted by sonographer. Common bile duct: Diameter: 0.5 cm, within normal limits Liver: No focal lesion identified. Within normal limits in parenchymal echogenicity. Portal vein is patent on color Doppler imaging with normal direction of blood flow towards the liver. Other: None. IMPRESSION: Unremarkable sonographic exam of the right upper quadrant. Electronically Signed   By: Audie Pinto M.D.   On: 07/21/2020 12:25        Scheduled Meds: . allopurinol  100 mg Oral Daily  . amitriptyline  20 mg Oral QHS  . vitamin C  500 mg Oral Daily  . aspirin EC  81 mg Oral Daily  . colchicine  0.6 mg Oral Daily  .  enoxaparin (LOVENOX) injection  40 mg Subcutaneous Q24H  . folic acid  403 mcg Oral Daily  . furosemide  40 mg Intravenous Q12H  . leflunomide  10 mg Oral Daily  . mesalamine  500 mg Oral BID  . methylPREDNISolone (SOLU-MEDROL) injection  0.5 mg/kg Intravenous Q12H   Followed by  . [START ON 07/24/2020] predniSONE  50 mg Oral Daily  . metoprolol succinate  75 mg Oral Daily  . multivitamin with minerals  1 tablet Oral Daily  . pantoprazole  40 mg Oral Daily  . potassium chloride  10 mEq Oral Daily  . sacubitril-valsartan  1 tablet Oral  BID  . sodium chloride flush  3 mL Intravenous Q12H  . thiamine  100 mg Oral Daily  . umeclidinium-vilanterol  1 puff Inhalation Daily  . vitamin B-12  500 mcg Oral Daily  . zinc sulfate  220 mg Oral Daily   Continuous Infusions: . sodium chloride    . cefTRIAXone (ROCEPHIN)  IV 1 g (07/22/20 1651)  . remdesivir 100 mg in NS 100 mL Stopped (07/22/20 1058)    Assessment & Plan:   Principal Problem:   Acute on chronic systolic CHF (congestive heart failure) (HCC) Active Problems:   COPD (chronic obstructive pulmonary disease) (HCC)   Depression with anxiety   Transaminitis   HTN (hypertension)   Crohn's disease (HCC)   Chronic respiratory failure with hypoxia ([place])   COVID-19 virus detected   Acute lower UTI   Acute on chronic systolic CHF Patient sent to the emergency room for evaluation of fever and worsening hypoxia She has a history of chronic respiratory failure and is usually on 1 to 2 L of oxygen but currently requires 3 L to maintain pulse oximetry of 92% Chest x-ray shows bilateral pleural effusions,interstitial edema BNP elevated >4500 5/17:continue lasix 56m iv bid I's and O's Daily weight Continue Entresto and metoprolol Monitor labs, renal function    Acute on chronic  respiratory failure with hypoxia-patient on 1 to 2 L oxygen at all times at home, required 3 L here sating 91-93% Likely multifactorial including from  COVID-19 infection and acute on chronic systolic heart failure 5981continue to treat for CHF  We will continue to treat for COVID infection as below  Keep O2 sats above 92% with O2 supplementation      COVID-19 infection Patient sent to the emergency room from skilled nursing facility for evaluation of fever and hypoxia. Unsure of patient's vaccination status 5/17 continue remdesivir  Continue IV steroids with taper  CRP decreasing continue to monitoring inflammatory markers  Continue vitamins, antitussive, bronchodilators  I-S and flutter valve    UTI UA positive Urine culture with multiple species We will continue treating empirically with Rocephin   COPD, steroid-dependent Chronic respiratory failure with hypoxia ([place] Patient with increased oxygen requirement most likely secondary to acute systolic heart failure and COVID infection  Continue bronchodilators       Crohn's disease ([place] Rheumatoid arthritis ([place] Patient on chronic immunosuppressive therapy and received her first dose of Humira 1 day prior to admission Continue leflunomide and mesalamine      Transaminitis Possibly likely medication induced versus CHF/congestion or both Right upper quadrant ultrasound does not show any acute findings LFTs trending down we will continue to monitor        DVT prophylaxis: Lovenox Code Status: Full Family Communication: Son updated Status is: Inpatient  Remains inpatient appropriate because:Inpatient level of care appropriate due to severity of illness   Dispo: The patient is from: SNF              Anticipated d/c is to: SNF              Patient currently is not medically stable to d/c.   Difficult to place patient No            LOS: 2 days   Time spent: 35 minutes with more than 50% on CEschbach MD [facility] Pager [phone]  If 7PM-7AM, please contact night-coverage 07/23/2020, 8:59 AM

## 2020-07-23 NOTE — Care Plan (Signed)
MD notified that pt has been voiding well during day shift.  Bladder scan 118 after voiding. Will continue to monitor pt at this time.  Do not insert foley at this time unless pt bladder scan above 300.

## 2020-07-24 DIAGNOSIS — I5023 Acute on chronic systolic (congestive) heart failure: Secondary | ICD-10-CM | POA: Diagnosis not present

## 2020-07-24 LAB — COMPREHENSIVE METABOLIC PANEL
ALT: 109 U/L — ABNORMAL HIGH (ref 0–44)
AST: 94 U/L — ABNORMAL HIGH (ref 15–41)
Albumin: 3 g/dL — ABNORMAL LOW (ref 3.5–5.0)
Alkaline Phosphatase: 313 U/L — ABNORMAL HIGH (ref 38–126)
Anion gap: 13 (ref 5–15)
BUN: 52 mg/dL — ABNORMAL HIGH (ref 8–23)
CO2: 26 mmol/L (ref 22–32)
Calcium: 7.8 mg/dL — ABNORMAL LOW (ref 8.9–10.3)
Chloride: 102 mmol/L (ref 98–111)
Creatinine, Ser: 1.24 mg/dL — ABNORMAL HIGH (ref 0.44–1.00)
GFR, Estimated: 43 mL/min — ABNORMAL LOW (ref >60)
Glucose, Bld: 244 mg/dL — ABNORMAL HIGH (ref 70–99)
Potassium: 5.3 mmol/L — ABNORMAL HIGH (ref 3.5–5.1)
Sodium: 141 mmol/L (ref 135–145)
Total Bilirubin: 0.8 mg/dL (ref 0.3–1.2)
Total Protein: 5.7 g/dL — ABNORMAL LOW (ref 6.5–8.1)

## 2020-07-24 LAB — BASIC METABOLIC PANEL
Anion gap: 16 — ABNORMAL HIGH (ref 5–15)
BUN: 58 mg/dL — ABNORMAL HIGH (ref 8–23)
CO2: 20 mmol/L — ABNORMAL LOW (ref 22–32)
Calcium: 7.6 mg/dL — ABNORMAL LOW (ref 8.9–10.3)
Chloride: 103 mmol/L (ref 98–111)
Creatinine, Ser: 1.22 mg/dL — ABNORMAL HIGH (ref 0.44–1.00)
GFR, Estimated: 44 mL/min — ABNORMAL LOW (ref >60)
Glucose, Bld: 313 mg/dL — ABNORMAL HIGH (ref 70–99)
Potassium: 4.2 mmol/L (ref 3.5–5.1)
Sodium: 139 mmol/L (ref 135–145)

## 2020-07-24 LAB — CBC WITH DIFFERENTIAL/PLATELET
Abs Immature Granulocytes: 0.05 10*3/uL (ref 0.00–0.07)
Basophils Absolute: 0 10*3/uL (ref 0.0–0.1)
Basophils Relative: 0 %
Eosinophils Absolute: 0 10*3/uL (ref 0.0–0.5)
Eosinophils Relative: 0 %
HCT: 43.7 % (ref 36.0–46.0)
Hemoglobin: 14.4 g/dL (ref 12.0–15.0)
Immature Granulocytes: 1 %
Lymphocytes Relative: 6 %
Lymphs Abs: 0.4 10*3/uL — ABNORMAL LOW (ref 0.7–4.0)
MCH: 29.4 pg (ref 26.0–34.0)
MCHC: 33 g/dL (ref 30.0–36.0)
MCV: 89.4 fL (ref 80.0–100.0)
Monocytes Absolute: 0.6 10*3/uL (ref 0.1–1.0)
Monocytes Relative: 8 %
Neutro Abs: 5.7 10*3/uL (ref 1.7–7.7)
Neutrophils Relative %: 85 %
Platelets: 138 10*3/uL — ABNORMAL LOW (ref 150–400)
RBC: 4.89 MIL/uL (ref 3.87–5.11)
RDW: 17 % — ABNORMAL HIGH (ref 11.5–15.5)
WBC: 6.8 10*3/uL (ref 4.0–10.5)
nRBC: 0 % (ref 0.0–0.2)

## 2020-07-24 LAB — FERRITIN: Ferritin: 210 ng/mL (ref 11–307)

## 2020-07-24 LAB — C-REACTIVE PROTEIN: CRP: 9.9 mg/dL — ABNORMAL HIGH (ref <1.0)

## 2020-07-24 LAB — PHOSPHORUS: Phosphorus: 4.9 mg/dL — ABNORMAL HIGH (ref 2.5–4.6)

## 2020-07-24 LAB — GLUCOSE, CAPILLARY
Glucose-Capillary: 322 mg/dL — ABNORMAL HIGH (ref 70–99)
Glucose-Capillary: 313 mg/dL — ABNORMAL HIGH (ref 70–99)

## 2020-07-24 LAB — MAGNESIUM: Magnesium: 2.1 mg/dL (ref 1.7–2.4)

## 2020-07-24 LAB — BRAIN NATRIURETIC PEPTIDE: B Natriuretic Peptide: >4500 pg/mL — ABNORMAL HIGH (ref 0.0–100.0)

## 2020-07-24 LAB — D-DIMER, QUANTITATIVE: D-Dimer, Quant: 1.01 ug/mL-FEU — ABNORMAL HIGH (ref 0.00–0.50)

## 2020-07-24 MED ORDER — INSULIN ASPART 100 UNIT/ML IJ SOLN
0.0000 [IU] | Freq: Every day | INTRAMUSCULAR | Status: DC
  Administered 2020-07-24: 4 [IU] via SUBCUTANEOUS
  Filled 2020-07-24: qty 1

## 2020-07-24 MED ORDER — INSULIN ASPART 100 UNIT/ML IJ SOLN
0.0000 [IU] | Freq: Three times a day (TID) | INTRAMUSCULAR | Status: DC
  Administered 2020-07-25 (×2): 2 [IU] via SUBCUTANEOUS
  Administered 2020-07-25 – 2020-07-26 (×2): 3 [IU] via SUBCUTANEOUS
  Administered 2020-07-27 (×2): 1 [IU] via SUBCUTANEOUS
  Administered 2020-07-28: 2 [IU] via SUBCUTANEOUS
  Administered 2020-07-29: 3 [IU] via SUBCUTANEOUS
  Filled 2020-07-24 (×8): qty 1

## 2020-07-24 NOTE — Evaluation (Signed)
Occupational Therapy Evaluation Patient Details Name: Raven [name] MRN: [id number] DOB: 04/10/1934 Today's Date: 07/24/2020    History of Present Illness Raven [name] is an 46yoF who [facility] on [facility]15 c fever. PMH: CAD, sCHF on enestro and metoprolol, EF 45-50%, steroid dependent COPD on 1-2L at home, dementia, Crohn's disease, RA, had been on Humira for years, recently stopped due to concerns over cadiac affects, has felt sick ever since.[name] Upon arrival, BNP >4500, (+) Covid 5/15.   Clinical Impression   Pt seen for OT evaluation this date. Prior to previous admission earlier this month, pt was independent in all ADLs and functional mobility, living in a 1-story home alone. Pt on O2 at home. Per chart review, pt was receiving rehab at [facility] following most recent admission. Pt currently requires SUPERVISION/SET-UP for seated UB ADLs, MIN A for seated LB dressing, and MAX A for squat pivot transfers to/from [facility] due to current functional impairments (See OT Problem List below). Despite decreased activity tolerance, pt motivated to work with OT following PT session and perform UB therex consisting of x1 0reps of shoulder flexion, should horizontal abduction, elbow flexion, and scapular retraction. Pt would benefit from additional skilled OT services to maximize return to PLOF and minimize risk of future falls, injury, caregiver burden, and readmission. Upon discharge, recommend SNF services.      Follow Up Recommendations  SNF    Equipment Recommendations  Other (comment) (defer to next venue of care)       Precautions / Restrictions Precautions Precautions: Fall Restrictions Weight Bearing Restrictions: No      Mobility Bed Mobility Overal bed mobility: Needs Assistance Bed Mobility: Supine to Sit     Supine to sit: Mod assist     General bed mobility comments: Not assessed, pt in recliner upon arrival    Transfers Overall transfer level: Needs  assistance Equipment used: Rolling walker (2 wheeled);None Transfers: Sit to/from Stand Sit to Stand: Max assist (unable to clear hips from recliner d/t fatigue following PT session) Stand pivot transfers: Total assist;+2 physical assistance;From elevated surface (unable to take steps)            Balance Overall balance assessment: Needs assistance Sitting-balance support: No upper extremity supported;Feet supported Sitting balance-Leahy Scale: Good     Standing balance support: Bilateral upper extremity supported;During functional activity Standing balance-Leahy Scale: Zero                             ADL either performed or assessed with clinical judgement   ADL Overall ADL's : Needs assistance/impaired     Grooming: Wash/dry face;Oral care;Brushing hair;Supervision/safety;Set up;Sitting           Upper Body Dressing : Supervision/safety;Set up;Sitting Upper Body Dressing Details (indicate cue type and reason): to don/doff hospital gown Lower Body Dressing: Minimal assistance;Sit to/from stand Lower Body Dressing Details (indicate cue type and reason): to doff briefs Toilet Transfer: Maximal assistance;BSC;Squat-pivot                   Vision Baseline Vision/History: Wears glasses Wears Glasses: At all times Patient Visual Report: No change from baseline              Pertinent Vitals/Pain Pain Assessment:  (BLE very tender to touch)     Hand Dominance Right   Extremity/Trunk Assessment Upper Extremity Assessment Upper Extremity Assessment: Generalized weakness;LUE deficits/detail LUE Deficits / Details: decreased shld and elbow flexion  d/t pain LUE: Unable to fully assess due to pain   Lower Extremity Assessment Lower Extremity Assessment: Generalized weakness       Communication Communication Communication: No difficulties   Cognition Arousal/Alertness: Awake/alert Behavior During Therapy: WFL for tasks  assessed/performed Overall Cognitive Status: Within Functional Limits for tasks assessed                                 General Comments: Pt A&Ox4 and agreeable throughout.      Exercises General Exercises - Upper Extremity Shoulder Flexion: AROM;Strengthening;10 reps;Seated Shoulder Horizontal ABduction: AROM;Strengthening;10 reps;Seated Elbow Extension: AROM;Strengthening;Both;10 reps;Seated General Exercises - Lower Extremity Long Arc Quad: AROM;Strengthening;10 reps;Seated        Home Living Family/patient expects to be discharged to:: Private residence Living Arrangements: Alone Available Help at Discharge: Family;Available PRN/intermittently Type of Home: House Home Access: Stairs to enter [employer] of Steps: 3 Entrance Stairs-Rails: Can reach both;Right;Left Home Layout: One level     Bathroom Shower/Tub: Occupational psychologist: Handicapped height     Home Equipment: Cane - single point;Walker - 2 wheels;Grab bars - tub/shower;Shower seat - built in;Bedside commode   Additional Comments: Son lives close and available for intermittent assist, uses O2 24/7      Prior Functioning/Environment Level of Independence: Independent        Comments: Ind amb without an AD community distances, no fall history, Ind with ADLs, drives        OT Problem List: Decreased strength;Decreased range of motion;Decreased activity tolerance;Impaired balance (sitting and/or standing);Decreased knowledge of use of DME or AE;Cardiopulmonary status limiting activity;Increased edema      OT Treatment/Interventions: Self-care/ADL training;DME and/or AE instruction;Therapeutic activities;Balance training;Therapeutic exercise;Energy conservation;Patient/family education    OT Goals(Current goals can be found in the care plan section) Acute Rehab OT Goals Patient Stated Goal: to get moving again OT Goal Formulation: With patient Time For Goal  Achievement: 08/09/2020 Potential to Achieve Goals: Good ADL Goals Pt Will Perform Lower Body Dressing: with min guard assist;sitting/lateral leans Pt Will Transfer to Toilet: with mod assist;stand pivot transfer;bedside commode Pt Will Perform Toileting - Clothing Manipulation and hygiene: with min assist;sitting/lateral leans  OT Frequency: Min 1X/week    AM-PAC OT "6 Clicks" Daily Activity     Outcome Measure Help from another person eating meals?: None Help from another person taking care of personal grooming?: A Little Help from another person toileting, which includes using toliet, bedpan, or urinal?: A Lot Help from another person bathing (including washing, rinsing, drying)?: A Lot Help from another person to put on and taking off regular upper body clothing?: A Little Help from another person to put on and taking off regular lower body clothing?: A Lot 6 Click Score: 16   End of Session Equipment Utilized During Treatment: Gait belt;Rolling walker;Oxygen Nurse Communication: Mobility status  Activity Tolerance: Patient tolerated treatment well Patient left: in chair;with call bell/phone within reach;with chair alarm set  OT Visit Diagnosis: Unsteadiness on feet (R26.81);Muscle weakness (generalized) (M62.81)                Time: 2035-5974 OT Time Calculation (min): 44 min Charges:  OT General Charges $OT Visit: 1 Visit OT Evaluation $OT Eval Moderate Complexity: 1 Mod OT Treatments $Self Care/Home Management : 23-37 mins  Fredirick Maudlin, OTR/L [place]

## 2020-07-24 NOTE — Progress Notes (Signed)
PROGRESS NOTE  Raven [name]  DOB: 06-Jan-1935  PCP: Baxter Hire, MD TKP:[id number]  DOA: 07/21/2020  LOS: 3 days  Hospital Day: 4   Chief Complaint  Patient presents with  . Fever  . Altered Mental Status   Brief narrative: Raven [name] is a 86 y.o. female with PMH significant for CAD,chronicsystolic CHF, last EF 45 to 50% on 03/09/2020, steroid-dependent COPD on chronic O2 at 1 to 2 L,dementia,Crohn's disease and rheumatoid arthritis who was recently hospitalized for acute on chronic systolic heart failure. 5/15, patient was brought to the ED from SNF for fever, hypoxia, altered mental status She had received her first dose of Humira 1 day prior to admission.  In the ED, she had a temperature of 100.5. She was noted to be COVID-positive Chest x-ray showed small bilateral pleural effusion as well as mild diffuse interstitial edema. Clinically in acute exacerbation of CHF Also noted to have UTI. Admitted to hospitalist service See below for details  Subjective: Patient was seen and examined this morning. Pleasant elderly Caucasian female.  Sitting up at the edge of the bed.  Not in distress. Continues to feel tired.  Assessment/Plan: COVID pneumonia -Presented with fever, hypoxia, altered mental status -COVID test: PCR positive on admission -Treatment: On a 5-day course of IV remdesivir till 5/19.  On steroids  -Supportive care: Vitamin C, Zinc, PRN inhalers, Tylenol, Antitussives (benzonatate/ Mucinex/Tussionex).   -Encouraged incentive spirometry, prone position, out of bed and early mobilization as much as possible -Continue airborne/contact isolation precautions for duration of 3 weeks from the day of diagnosis. -WBC and inflammatory markers trend as below.  Recent Labs  Lab 07/21/20 1054 07/21/20 1207 07/22/20 0419 07/23/20 0521 07/24/20 0655  SARSCOV2NAA POSITIVE*  --   --   --   --   WBC  --  9.2 7.6 5.6 6.8  LATICACIDVEN 1.5 1.5  --   --   --    PROCALCITON 0.13  --   --   --   --   DDIMER  --   --  1.58* 1.14* 1.01*  FERRITIN  --   --  205 204 210  CRP  --   --  19.4* 14.3* 9.9*  ALT 100*  --  83* 89* 109*   Acute on chronic respiratory failure with hypoxia  -Multifactorial : COVID infection, CHF exacerbation.   -Currently on 2 L oxygen by nasal cannula which is her baseline requirement. -Oxygen - SpO2: 97 % O2 Flow Rate (L/min): 2 L/min   Acute on chronic systolic CHF -History of CHF, last echo on 03/09/2020 with EF 45 to 50%.   -Chest x-ray on presentation with small bilateral pleural effusion and diffuse interstitial edema  -BNP elevated to more than 4000.   -She was diuresed with Lasix 40 mg IV twice daily.  I held it today because of elevated creatinine   -Continue to monitor on metoprolol and Entresto. -Net IO Since Admission: -1,415 mL [07/24/20 1815] -Continue to monitor for daily intake output, weight, blood pressure, BNP, renal function and electrolytes. Recent Labs  Lab 07/21/20 1054 07/21/20 1207 07/22/20 0419 07/23/20 0521 07/24/20 0655 07/24/20 1305  BNP  --  >4,500.0*  --   --  >4,500.0*  --   BUN 34*  --  33* 42* 52* 58*  CREATININE 1.14*  --  0.99 0.91 1.24* 1.22*  K 4.1  --  3.3* 4.1 5.3* 4.2  MG  --   --  1.6* 2.0 2.1  --  Elevated creatinine -Creatinine trending up, was elevated to 1.24 this morning. -Expect improvement after Lasix IV was held this morning.  Continue to monitor.  UTI -UA positive.  Urine culture with multiple species.  Currently on IV Rocephin.  COPD -Chronically on prednisone, bronchodilators.  Crohn's disease ([place]) Rheumatoid arthritis ([place]) -Patient on chronic immunosuppressive therapyand received her first dose of Humira 1 day prior to admission -Continueleflunomide and mesalamine  Transaminitis -Possibly likely medication induced versus CHF/congestion or both -Right upper quadrant ultrasound does not show any acute findings -LFTs trending down we will  continue to monitor  Mobility: Encourage ambulation.  PT eval Code Status:   Code Status: Full Code  Nutritional status: Body mass index is 26.83 kg/m.     Diet Order            Diet 2 gram sodium Room service appropriate? Yes; Fluid consistency: Thin  Diet effective now                 DVT prophylaxis: enoxaparin (LOVENOX) injection 40 mg Start: 07/21/20 1600   Antimicrobials:  IV Rocephin Fluid: None Consultants: None Family Communication:  Not at bedside  Status is: Inpatient  Remains inpatient appropriate because: Continues to be short of breath  Dispo: The patient is from: SNF              Anticipated d/c is to: SNF in 1 to 2 days              Patient currently is not medically stable to d/c.   Difficult to place patient No     Infusions:  . sodium chloride    . cefTRIAXone (ROCEPHIN)  IV 1 g (07/24/20 1613)  . remdesivir 100 mg in NS 100 mL 100 mg (07/24/20 1100)    Scheduled Meds: . allopurinol  100 mg Oral Daily  . amitriptyline  20 mg Oral QHS  . vitamin C  500 mg Oral Daily  . aspirin EC  81 mg Oral Daily  . colchicine  0.6 mg Oral Daily  . enoxaparin (LOVENOX) injection  40 mg Subcutaneous Q24H  . folic acid  295 mcg Oral Daily  . insulin aspart  0-5 Units Subcutaneous QHS  . [START ON 07/25/2020] insulin aspart  0-9 Units Subcutaneous TID WC  . leflunomide  10 mg Oral Daily  . mesalamine  500 mg Oral BID  . metoprolol succinate  75 mg Oral Daily  . multivitamin with minerals  1 tablet Oral Daily  . pantoprazole  40 mg Oral Daily  . predniSONE  50 mg Oral Daily  . sacubitril-valsartan  1 tablet Oral BID  . sodium chloride flush  3 mL Intravenous Q12H  . thiamine  100 mg Oral Daily  . umeclidinium-vilanterol  1 puff Inhalation Daily  . vitamin B-12  500 mcg Oral Daily  . zinc sulfate  220 mg Oral Daily    Antimicrobials: Anti-infectives (From admission, onward)   Start     Dose/Rate Route Frequency Ordered Stop   07/22/20 1000  remdesivir  100 mg in sodium chloride 0.9 % 100 mL IVPB  Status:  Discontinued       "Followed by" Linked Group Details   100 mg 200 mL/hr over 30 Minutes Intravenous Daily 07/21/20 1413 07/21/20 1417   07/22/20 1000  remdesivir 100 mg in sodium chloride 0.9 % 100 mL IVPB       "Followed by" Linked Group Details   100 mg 200 mL/hr over 30 Minutes Intravenous Daily  07/21/20 1416 07/26/20 0959   07/21/20 1600  remdesivir 200 mg in sodium chloride 0.9% 250 mL IVPB  Status:  Discontinued       "Followed by" Linked Group Details   200 mg 580 mL/hr over 30 Minutes Intravenous Once 07/21/20 1413 07/21/20 1417   07/21/20 1600  cefTRIAXone (ROCEPHIN) 1 g in sodium chloride 0.9 % 100 mL IVPB        1 g 200 mL/hr over 30 Minutes Intravenous Every 24 hours 07/21/20 1508 07/26/20 1559   07/21/20 1430  remdesivir 200 mg in sodium chloride 0.9% 250 mL IVPB       "Followed by" Linked Group Details   200 mg 580 mL/hr over 30 Minutes Intravenous Once 07/21/20 1416 07/21/20 1902   07/21/20 1345  ciprofloxacin (CIPRO) IVPB 400 mg  Status:  Discontinued        400 mg 200 mL/hr over 60 Minutes Intravenous  Once 07/21/20 1344 07/21/20 1508      PRN meds: sodium chloride, acetaminophen, acetaminophen, albuterol, dextromethorphan-guaiFENesin, ondansetron (ZOFRAN) IV, sodium chloride flush, traZODone   Objective: Vitals:   07/24/20 1315 07/24/20 1609  BP: (!) 158/80 139/67  Pulse: 81 79  Resp: 17 20  Temp: 98 F (36.7 C) 97.7 F (36.5 C)  SpO2: 100% 97%    Intake/Output Summary (Last 24 hours) at 07/24/2020 1815 Last data filed at 07/24/2020 1759 Gross per 24 hour  Intake 880 ml  Output 1050 ml  Net -170 ml   Filed Weights   07/21/20 1042 07/23/20 0304 07/24/20 0256  Weight: 78 kg 75.4 kg 75.4 kg   Weight change: -0.045 kg Body mass index is 26.83 kg/m.   Physical Exam: General exam: Pleasant elderly Caucasian female.  Not in physical distress Skin: No rashes, lesions or ulcers. HEENT: Atraumatic,  normocephalic, no obvious bleeding Lungs: Clear to auscultation bilaterally CVS: Regular rate and rhythm, no murmur GI/Abd soft, nontender, nondistended, bowel sound present CNS: Alert, awake, oriented to place and person Psychiatry: Mood appropriate Extremities: Pedal edema 1+ bilaterally  Data Review: I have personally reviewed the laboratory data and studies available.  Recent Labs  Lab 07/21/20 1207 07/22/20 0419 07/23/20 0521 07/24/20 0655  WBC 9.2 7.6 5.6 6.8  NEUTROABS 7.3 6.7 4.4 5.7  HGB 14.1 13.9 13.3 14.4  HCT 44.3 43.3 41.6 43.7  MCV 89.7 89.3 89.5 89.4  PLT 116* 109* 125* 138*   Recent Labs  Lab 07/21/20 1054 07/22/20 0419 07/23/20 0521 07/24/20 0655 07/24/20 1305  NA 141 144 141 141 139  K 4.1 3.3* 4.1 5.3* 4.2  CL 102 106 105 102 103  CO2 26 27 25 26  20*  GLUCOSE 94 158* 203* 244* 313*  BUN 34* 33* 42* 52* 58*  CREATININE 1.14* 0.99 0.91 1.24* 1.22*  CALCIUM 8.5* 7.6* 7.3* 7.8* 7.6*  MG  --  1.6* 2.0 2.1  --   PHOS  --  4.6 4.2 4.9*  --     F/u labs ordered Unresulted Labs (From admission, onward)          Start     Ordered   07/28/20 0500  Creatinine, serum  (enoxaparin (LOVENOX)    CrCl >/= 30 ml/min)  Weekly,   STAT     Comments: while on enoxaparin therapy    07/21/20 1414   07/22/20 0500  CBC with Differential/Platelet  Daily,   STAT      07/21/20 1416   07/22/20 0500  Comprehensive metabolic panel  Daily,   STAT  07/21/20 1416   07/22/20 0500  C-reactive protein  Daily,   STAT      07/21/20 1416   07/22/20 0500  D-dimer, quantitative  Daily,   STAT      07/21/20 1416   07/22/20 0500  Ferritin  Daily,   STAT      07/21/20 1416   07/22/20 0500  Magnesium  Daily,   STAT      07/21/20 1416   07/22/20 0500  Phosphorus  Daily,   STAT      07/21/20 1416   07/21/20 1044  CBC WITH DIFFERENTIAL  (Undifferentiated presentation (screening labs and basic nursing orders))  ONCE - STAT,   STAT        07/21/20 1043           Signed, Terrilee Croak, MD [facility] 07/24/2020

## 2020-07-24 NOTE — Progress Notes (Signed)
Inpatient Diabetes Program Recommendations  AACE/ADA: New Consensus Statement on Inpatient Glycemic Control   Target Ranges:  Prepandial:   less than 140 mg/dL      Peak postprandial:   less than 180 mg/dL (1-2 hours)      Critically ill patients:  140 - 180 mg/dL  Results for JOVANKA, WESTGATE (MRN [id number]) as of 07/24/2020 13:12  Ref. Range 07/21/2020 10:54 07/22/2020 04:19 07/23/2020 05:21 07/24/2020 06:55  Glucose Latest Ref Range: 70 - 99 mg/dL 94 158 (H) 203 (H) 244 (H)   Results for ANNISON, BIRCHARD (MRN [id number]) as of 07/24/2020 13:12  Ref. Range 07/04/2020 23:55  Hemoglobin A1C Latest Ref Range: 4.8 - 5.6 % 7.5 (H)   Review of Glycemic Control  Diabetes history: DM2 Outpatient Diabetes medications: none; diet controlled Current orders for Inpatient glycemic control: None; Prednisone 50 mg daily (was ordered Solumedrol)  Inpatient Diabetes Program Recommendations:    Insulin: While inpatient, please consider ordering CBGs with Novolog 0-9 units TID with meals and Novolog 0-5 units QHS.  Thanks, Barnie Alderman, RN, MSN, CDE Diabetes Coordinator Inpatient Diabetes Program [phone] (Team Pager from 8am to 5pm)

## 2020-07-24 NOTE — Evaluation (Signed)
Physical Therapy Evaluation Patient Details Name: Raven [name] MRN: [id number] DOB: 1934/05/19 Today's Date: 07/24/2020   History of Present Illness  Raven [name] is an 46yoF who comes to [facility] on 5/15 c fever. PMH: CAD, sCHF on enestro and metoprolol, EF 45-50%, steroid dependent COPD on 1-2L at home, dementia, Crohn's disease, RA, had been on Humira for years, recently stopped due to concerns over cadiac affects, has felt sick ever since.[name] Upon arrival, BNP >4500, (+) Covid 5/15.  Clinical Impression  Pt admitted with above diagnosis. Pt currently with functional limitations due to the deficits listed below (see "PT Problem List"). Upon entry, pt in bed, awake and agreeable to participate. The pt is alert, pleasant, interactive, and able to provide info regarding prior level of function, both in tolerance and independence. Pt severely weak this date, heavy BLE with weakness and tenderness, pitting to knee level bilat. Pt requires heavy physical assist to come to sitting EOB, max-totalA +1-2 for STS or SPT, but too weak to manager her ADL while supported in standing. Multiple attempts to don doff undergarments for toileting, peircare unsuccessful in stance. Pt tolerates<1 minutes supported standing. Pt with burgeoning concern regarding weakness and difficulty moving, repeatedly asks to attempt unsupported, never able to muster enough strength, then more easily overwhelmed/overstimualted by lines/leads, etc. Pt on RA for transfers, Sats 95% or better. Pt too weak to attempt AMB, not able to stand. Patient's performance this date reveals significant decreased ability, independence, and tolerance in performing all basic mobility required for performance of activities of daily living. Pt requires additional DME, heavy physical assistance, and cues for safe participate in mobility. Pt unable to walk, cannot get out of bed without significant physical assist, would be very unsafe to DC back to home alone. Pt  will benefit from skilled PT intervention to increase independence and safety with basic mobility in preparation for discharge to the venue listed below.       Follow Up Recommendations SNF;Supervision for mobility/OOB    Equipment Recommendations  None recommended by PT    Recommendations for Other Services       Precautions / Restrictions Precautions Precautions: Fall Restrictions Weight Bearing Restrictions: No      Mobility  Bed Mobility Overal bed mobility: Needs Assistance Bed Mobility: Supine to Sit     Supine to sit: Mod assist     General bed mobility comments: much weaker than earlier this morning, requires max effort adn 2-3 minutes to fully get to EOB, eventually modA of trunk    Transfers Overall transfer level: Needs assistance Equipment used: Rolling walker (2 wheeled);None Transfers: Sit to/from Stand Sit to Stand: From elevated surface;Mod assist (quickly prone to fatiue/weakness) Stand pivot transfers: Total assist;+2 physical assistance;From elevated surface (unable to take steps)          Ambulation/Gait Ambulation/Gait assistance:  (unable due ot severe weakness)              Stairs            Wheelchair Mobility    Modified Rankin (Stroke Patients Only)       Balance     Sitting balance-Leahy Scale: Good     Standing balance support: Bilateral upper extremity supported Standing balance-Leahy Scale: Zero                               Pertinent Vitals/Pain Pain Assessment:  (BLE very tender to touch)  Home Living Family/patient expects to be discharged to:: Private residence Living Arrangements: Alone Available Help at Discharge: Family;Available PRN/intermittently Type of Home: House Home Access: Stairs to enter Entrance Stairs-Rails: Can reach both;Right;Left Entrance Stairs-Number of Steps: 3 Home Layout: One level Home Equipment: Cane - single point;Walker - 2 wheels;Grab bars -  tub/shower;Shower seat - built in;Bedside commode Additional Comments: Son lives close and available for intermittent assist, uses O2 24/7    Prior Function Level of Independence: Independent         Comments: Ind amb without an AD community distances, no fall history, Ind with ADLs, drives     Hand Dominance   Dominant Hand: Right    Extremity/Trunk Assessment                Communication   Communication: No difficulties  Cognition Arousal/Alertness: Awake/alert Behavior During Therapy: WFL for tasks assessed/performed;Anxious;Agitated (overstimulated from lines/leads) Overall Cognitive Status: Within Functional Limits for tasks assessed                                        General Comments      Exercises     Assessment/Plan    PT Assessment Patient needs continued PT services  PT Problem List Decreased strength;Decreased activity tolerance;Decreased balance;Decreased mobility;Decreased knowledge of use of DME       PT Treatment Interventions DME instruction;Gait training;Stair training;Functional mobility training;Therapeutic activities;Therapeutic exercise;Balance training;Patient/family education    PT Goals (Current goals can be found in the Care Plan section)  Acute Rehab PT Goals Patient Stated Goal: regain strength PT Goal Formulation: With patient Time For Goal Achievement: 08/11/2020 Potential to Achieve Goals: Poor    Frequency Min 2X/week   Barriers to discharge Inaccessible home environment;Decreased caregiver support      Co-evaluation               AM-PAC PT "6 Clicks" Mobility  Outcome Measure Help needed turning from your back to your side while in a flat bed without using bedrails?: A Lot Help needed moving from lying on your back to sitting on the side of a flat bed without using bedrails?: A Lot Help needed moving to and from a bed to a chair (including a wheelchair)?: Total Help needed standing up from a  chair using your arms (e.g., wheelchair or bedside chair)?: Total Help needed to walk in hospital room?: Total Help needed climbing 3-5 steps with a railing? : Total 6 Click Score: 8    End of Session Equipment Utilized During Treatment: Gait belt;Oxygen Activity Tolerance: Patient limited by pain;Treatment limited secondary to agitation;Patient limited by fatigue (very frustrated at current state of weakness (unexpected) easily overstimulated by lines/leads/falls anxiety) Patient left: in chair;with call bell/phone within reach Nurse Communication: Mobility status PT Visit Diagnosis: Difficulty in walking, not elsewhere classified (R26.2);Muscle weakness (generalized) (M62.81);Other abnormalities of gait and mobility (R26.89);Unsteadiness on feet (R26.81)    Time: 2229-7989 PT Time Calculation (min) (ACUTE ONLY): 53 min   Charges:   PT Evaluation $PT Eval Low Complexity: 1 Low PT Treatments $Therapeutic Activity: 38-52 mins        1:51 PM, 07/24/20 Etta Grandchild, PT, DPT Physical Therapist - [facility]  [phone] ( [place])     [place] C 07/24/2020, 1:45 PM

## 2020-07-25 DIAGNOSIS — I5023 Acute on chronic systolic (congestive) heart failure: Secondary | ICD-10-CM | POA: Diagnosis not present

## 2020-07-25 LAB — CBC WITH DIFFERENTIAL/PLATELET
Abs Immature Granulocytes: 0.06 10*3/uL (ref 0.00–0.07)
Basophils Absolute: 0 10*3/uL (ref 0.0–0.1)
Basophils Relative: 0 %
Eosinophils Absolute: 0 10*3/uL (ref 0.0–0.5)
Eosinophils Relative: 0 %
HCT: 44.3 % (ref 36.0–46.0)
Hemoglobin: 14.2 g/dL (ref 12.0–15.0)
Immature Granulocytes: 1 %
Lymphocytes Relative: 8 %
Lymphs Abs: 0.5 10*3/uL — ABNORMAL LOW (ref 0.7–4.0)
MCH: 28.1 pg (ref 26.0–34.0)
MCHC: 32.1 g/dL (ref 30.0–36.0)
MCV: 87.5 fL (ref 80.0–100.0)
Monocytes Absolute: 0.7 10*3/uL (ref 0.1–1.0)
Monocytes Relative: 10 %
Neutro Abs: 5.9 10*3/uL (ref 1.7–7.7)
Neutrophils Relative %: 81 %
Platelets: 153 10*3/uL (ref 150–400)
RBC: 5.06 MIL/uL (ref 3.87–5.11)
RDW: 16.3 % — ABNORMAL HIGH (ref 11.5–15.5)
WBC: 7.2 10*3/uL (ref 4.0–10.5)
nRBC: 0.3 % — ABNORMAL HIGH (ref 0.0–0.2)

## 2020-07-25 LAB — FERRITIN: Ferritin: 171 ng/mL (ref 11–307)

## 2020-07-25 LAB — BLOOD GAS, ARTERIAL
Acid-Base Excess: 1 mmol/L (ref 0.0–2.0)
Bicarbonate: 23.8 mmol/L (ref 20.0–28.0)
FIO2: 0.3
O2 Saturation: 97.8 %
Patient temperature: 37
pCO2 arterial: 32 mmHg (ref 32.0–48.0)
pH, Arterial: 7.48 — ABNORMAL HIGH (ref 7.350–7.450)
pO2, Arterial: 93 mmHg (ref 83.0–108.0)

## 2020-07-25 LAB — GLUCOSE, CAPILLARY
Glucose-Capillary: 212 mg/dL — ABNORMAL HIGH (ref 70–99)
Glucose-Capillary: 172 mg/dL — ABNORMAL HIGH (ref 70–99)
Glucose-Capillary: 159 mg/dL — ABNORMAL HIGH (ref 70–99)
Glucose-Capillary: 168 mg/dL — ABNORMAL HIGH (ref 70–99)

## 2020-07-25 LAB — MAGNESIUM: Magnesium: 2.6 mg/dL — ABNORMAL HIGH (ref 1.7–2.4)

## 2020-07-25 LAB — COMPREHENSIVE METABOLIC PANEL
ALT: 111 U/L — ABNORMAL HIGH (ref 0–44)
AST: 78 U/L — ABNORMAL HIGH (ref 15–41)
Albumin: 2.9 g/dL — ABNORMAL LOW (ref 3.5–5.0)
Alkaline Phosphatase: 333 U/L — ABNORMAL HIGH (ref 38–126)
Anion gap: 13 (ref 5–15)
BUN: 58 mg/dL — ABNORMAL HIGH (ref 8–23)
CO2: 22 mmol/L (ref 22–32)
Calcium: 7.7 mg/dL — ABNORMAL LOW (ref 8.9–10.3)
Chloride: 106 mmol/L (ref 98–111)
Creatinine, Ser: 1.25 mg/dL — ABNORMAL HIGH (ref 0.44–1.00)
GFR, Estimated: 43 mL/min — ABNORMAL LOW (ref >60)
Glucose, Bld: 196 mg/dL — ABNORMAL HIGH (ref 70–99)
Potassium: 4.8 mmol/L (ref 3.5–5.1)
Sodium: 141 mmol/L (ref 135–145)
Total Bilirubin: 0.9 mg/dL (ref 0.3–1.2)
Total Protein: 5.5 g/dL — ABNORMAL LOW (ref 6.5–8.1)

## 2020-07-25 LAB — D-DIMER, QUANTITATIVE: D-Dimer, Quant: 0.78 ug/mL-FEU — ABNORMAL HIGH (ref 0.00–0.50)

## 2020-07-25 LAB — C-REACTIVE PROTEIN: CRP: 6.9 mg/dL — ABNORMAL HIGH (ref <1.0)

## 2020-07-25 LAB — PHOSPHORUS: Phosphorus: 4.3 mg/dL (ref 2.5–4.6)

## 2020-07-25 MED ORDER — FUROSEMIDE 40 MG PO TABS
40.0000 mg | ORAL_TABLET | Freq: Every day | ORAL | Status: DC
  Administered 2020-07-25: 40 mg via ORAL
  Filled 2020-07-25: qty 1

## 2020-07-25 MED ORDER — INSULIN GLARGINE 100 UNIT/ML ~~LOC~~ SOLN
10.0000 [IU] | Freq: Every day | SUBCUTANEOUS | Status: DC
  Administered 2020-07-25: 10 [IU] via SUBCUTANEOUS
  Filled 2020-07-25 (×3): qty 0.1

## 2020-07-25 MED ORDER — PREDNISONE 20 MG PO TABS
20.0000 mg | ORAL_TABLET | Freq: Every day | ORAL | Status: DC
  Administered 2020-07-26 – 2020-07-29 (×4): 20 mg via ORAL
  Filled 2020-07-25 (×2): qty 1
  Filled 2020-07-25: qty 2
  Filled 2020-07-25: qty 1

## 2020-07-25 NOTE — Progress Notes (Signed)
Mobility Specialist - Progress Note   07/25/20 1600  Mobility  Activity Dangled on edge of bed  Range of Motion/Exercises Right leg;Left leg (TR, SLR, HS, SM)  Level of Assistance Maximum assist, patient does 25-49%  Assistive Device None  Distance Ambulated (ft) 0 ft  Mobility  (Seated Therex)  Mobility Response Tolerated well  Mobility performed by Mobility specialist  $Mobility charge 1 Mobility    Pt sitting EOB on arrival. Participated in seated therex: toe raises x10, straight leg raises x5, heel slides x5, and seated march x5/leg. Pt with increased weakness in LLE in comparison to RLE. Able to complete exercises on RLE with UE support, but max assist on LLE to initiate movement. Rest breaks taken in between exercises d/t activity tolerance. Pt unable to elevate buttocks from surface with use of momentum from elevated height, standing trial deferred. Pt expresses fatigue from activity and requests to rest, session concluded. Pt left EOB with alarm set.    Kathee Delton Mobility Specialist 07/25/20, 4:38 PM

## 2020-07-25 NOTE — Progress Notes (Signed)
Patient with minimal urine output this shift, bladder scan revealed 439 ml of urine, MD notified, verbal order for I/O cath as needed for retention of >448m, Q 6 bladder scans. After prep for I/O cath patient voiced need to use bedpan, patient placed on bedpan and able to urinate 375 ml. Bladder scan post void 32 ml.

## 2020-07-25 NOTE — TOC Progression Note (Signed)
Transition of Care [facility]) - Progression Note    Patient Details  Name: Raven [name] MRN: [id number] Date of Birth: 16-Jun-1934  Transition of Care [facility]) CM/SW [place], [place] Phone Number: 07/25/2020, 2:48 PM  Clinical Narrative:     CSW spoke with Magda Paganini at [facility], informed her possible discharge tomorrow. Will continue to keep facility updated.   Expected Discharge Plan: [place] Barriers to Discharge: Continued Medical Work up  Expected Discharge Plan and Services Expected Discharge Plan: [place] arrangements for the past 2 months: [place] [employer])                                       Social Determinants of Health (SDOH) Interventions    Readmission Risk Interventions Readmission Risk Prevention Plan 03/11/2020 11/25/2019  Transportation Screening Complete Complete  Medication Review Press photographer) Complete Complete  PCP or Specialist appointment within 3-5 days of discharge Complete Complete  HRI or Home Care Consult Complete Complete  SW Recovery Care/Counseling Consult Complete Complete  Palliative Care Screening Not Applicable Not [place] Complete Not Applicable  Some recent data might be hidden

## 2020-07-25 NOTE — Progress Notes (Signed)
PROGRESS NOTE  Raven [name]  DOB: February 02, 1935  PCP: Baxter Hire, MD IDP:[id number]  DOA: 07/21/2020  LOS: 4 days  Hospital Day: 5   Chief Complaint  Patient presents with  . Fever  . Altered Mental Status   Brief narrative: Raven [name] is a 86 y.o. female with PMH significant for CAD,chronicsystolic CHF, last EF 45 to 50% on 03/09/2020, steroid-dependent COPD on chronic O2 at 1 to 2 L,dementia,Crohn's disease and rheumatoid arthritis who was recently hospitalized for acute on chronic systolic heart failure. 5/15, patient was brought to the ED from SNF for fever, hypoxia, altered mental status She had received her first dose of Humira 1 day prior to admission.  In the ED, she had a temperature of 100.5. She was noted to be COVID-positive Chest x-ray showed small bilateral pleural effusion as well as mild diffuse interstitial edema. Clinically in acute exacerbation of CHF Also noted to have UTI. Admitted to hospitalist service See below for details  Subjective: Patient was seen and examined this morning. Elderly Caucasian female.  Propped up in bed.  Looks very lethargic today.  Able to open eyes on name command and answer some questions but falls right back to sleep.  Assessment/Plan: COVID pneumonia -Presented with fever, hypoxia, altered mental status -COVID test: PCR positive on admission -Treatment: On a 5-day course of IV remdesivir till 5/19.  On steroids  -Supportive care: Vitamin C, Zinc, PRN inhalers, Tylenol, Antitussives (benzonatate/ Mucinex/Tussionex).   -Encouraged incentive spirometry, prone position, out of bed and early mobilization as much as possible -Continue airborne/contact isolation precautions for duration of 3 weeks from the day of diagnosis. -WBC and inflammatory markers trend as below.  Recent Labs  Lab 07/21/20 1054 07/21/20 1207 07/22/20 0419 07/23/20 0521 07/24/20 0655 07/25/20 0521  SARSCOV2NAA POSITIVE*  --   --   --   --    --   WBC  --  9.2 7.6 5.6 6.8 7.2  LATICACIDVEN 1.5 1.5  --   --   --   --   PROCALCITON 0.13  --   --   --   --   --   DDIMER  --   --  1.58* 1.14* 1.01* 0.78*  FERRITIN  --   --  205 204 210 171  CRP  --   --  19.4* 14.3* 9.9* 6.9*  ALT 100*  --  83* 89* 109* 111*   Acute on chronic respiratory failure with hypoxia  -Multifactorial : COVID infection, CHF exacerbation.   -Currently on 2 L oxygen by nasal cannula which is her baseline requirement. -Oxygen - SpO2: 97 % O2 Flow Rate (L/min): 2 L/min   Acute encephalopathy -Patient is significantly lethargic today.  Unclear etiology.  Blood sugar level checked.  Not hypoglycemic.  Rather running higher side of normal.  Obtain a blood gas.  Acute on chronic systolic CHF -History of CHF, last echo on 03/09/2020 with EF 45 to 50%.   -Chest x-ray on presentation with small bilateral pleural effusion and diffuse interstitial edema  -BNP elevated to more than 4000.   -She was diuresed with Lasix 40 mg IV twice daily.  5/18, it was held because of rising creatinine.  Okay to resume oral Lasix 40 mg twice daily today. -Continue to monitor on metoprolol and Entresto. -Net IO Since Admission: -2,315 mL [07/25/20 1330] -Continue to monitor for daily intake output, weight, blood pressure, BNP, renal function and electrolytes. Recent Labs  Lab 07/21/20 1207 07/22/20 0419  07/23/20 0521 07/24/20 0655 07/24/20 1305 07/25/20 0521  BNP >4,500.0*  --   --  >4,500.0*  --   --   BUN  --  33* 42* 52* 58* 58*  CREATININE  --  0.99 0.91 1.24* 1.22* 1.25*  K  --  3.3* 4.1 5.3* 4.2 4.8  MG  --  1.6* 2.0 2.1  --  2.6*   Elevated creatinine -Remains elevated at 1.25 this morning. -Okay to resume oral Lasix this morning  Type 2 diabetes mellitus -A1c 7.5 on 4/28 -Not on any home meds. -Currently on sliding scale insulin with Accu-Cheks. -Blood sugar level remains elevated close to 200. -We started the patient on Lantus 10 units from this  morning. Lab Results  Component Value Date   HGBA1C 7.5 (H) 07/04/2020   Recent Labs  Lab 07/23/20 2057 07/24/20 1707 07/24/20 2136 07/25/20 0833 07/25/20 1249  GLUCAP 280* 322* 313* 212* 172*   UTI -UA positive.  Urine culture with multiple species.  Currently on IV Rocephin.  COPD -Chronically on prednisone, bronchodilators.  Crohn's disease ([place]) Rheumatoid arthritis ([place]) -Patient on chronic immunosuppressive therapyand received her first dose of Humira 1 day prior to admission -Continueleflunomide and mesalamine  Transaminitis -Possibly likely medication induced versus CHF/congestion or both -Right upper quadrant ultrasound does not show any acute findings -LFTs trending down we will continue to monitor  Mobility: Encourage ambulation.  PT eval Code Status:   Code Status: Full Code  Nutritional status: Body mass index is 26.81 kg/m.     Diet Order            Diet 2 gram sodium Room service appropriate? Yes; Fluid consistency: Thin  Diet effective now                 DVT prophylaxis: enoxaparin (LOVENOX) injection 40 mg Start: 07/21/20 1600   Antimicrobials:  IV Rocephin Fluid: None Consultants: None Family Communication:  Not at bedside  Status is: Inpatient  Remains inpatient appropriate because: Cardiac.  Dispo: The patient is from: SNF              Anticipated d/c is to: SNF in 1 to 2 days              Patient currently is not medically stable to d/c.   Difficult to place patient No     Infusions:  . sodium chloride    . cefTRIAXone (ROCEPHIN)  IV 1 g (07/24/20 1613)    Scheduled Meds: . allopurinol  100 mg Oral Daily  . amitriptyline  20 mg Oral QHS  . vitamin C  500 mg Oral Daily  . aspirin EC  81 mg Oral Daily  . colchicine  0.6 mg Oral Daily  . enoxaparin (LOVENOX) injection  40 mg Subcutaneous Q24H  . folic acid  892 mcg Oral Daily  . insulin aspart  0-5 Units Subcutaneous QHS  . insulin aspart  0-9 Units Subcutaneous  TID WC  . insulin glargine  10 Units Subcutaneous Daily  . leflunomide  10 mg Oral Daily  . mesalamine  500 mg Oral BID  . metoprolol succinate  75 mg Oral Daily  . multivitamin with minerals  1 tablet Oral Daily  . pantoprazole  40 mg Oral Daily  . predniSONE  50 mg Oral Daily  . sacubitril-valsartan  1 tablet Oral BID  . sodium chloride flush  3 mL Intravenous Q12H  . thiamine  100 mg Oral Daily  . umeclidinium-vilanterol  1 puff Inhalation  Daily  . vitamin B-12  500 mcg Oral Daily  . zinc sulfate  220 mg Oral Daily    Antimicrobials: Anti-infectives (From admission, onward)   Start     Dose/Rate Route Frequency Ordered Stop   07/22/20 1000  remdesivir 100 mg in sodium chloride 0.9 % 100 mL IVPB  Status:  Discontinued       "Followed by" Linked Group Details   100 mg 200 mL/hr over 30 Minutes Intravenous Daily 07/21/20 1413 07/21/20 1417   07/22/20 1000  remdesivir 100 mg in sodium chloride 0.9 % 100 mL IVPB       "Followed by" Linked Group Details   100 mg 200 mL/hr over 30 Minutes Intravenous Daily 07/21/20 1416 07/25/20 1106   07/21/20 1600  remdesivir 200 mg in sodium chloride 0.9% 250 mL IVPB  Status:  Discontinued       "Followed by" Linked Group Details   200 mg 580 mL/hr over 30 Minutes Intravenous Once 07/21/20 1413 07/21/20 1417   07/21/20 1600  cefTRIAXone (ROCEPHIN) 1 g in sodium chloride 0.9 % 100 mL IVPB        1 g 200 mL/hr over 30 Minutes Intravenous Every 24 hours 07/21/20 1508 07/26/20 1559   07/21/20 1430  remdesivir 200 mg in sodium chloride 0.9% 250 mL IVPB       "Followed by" Linked Group Details   200 mg 580 mL/hr over 30 Minutes Intravenous Once 07/21/20 1416 07/21/20 1902   07/21/20 1345  ciprofloxacin (CIPRO) IVPB 400 mg  Status:  Discontinued        400 mg 200 mL/hr over 60 Minutes Intravenous  Once 07/21/20 1344 07/21/20 1508      PRN meds: sodium chloride, acetaminophen, acetaminophen, albuterol, dextromethorphan-guaiFENesin, ondansetron  (ZOFRAN) IV, sodium chloride flush, traZODone   Objective: Vitals:   07/25/20 0832 07/25/20 1245  BP: 130/84 124/69  Pulse: 81 65  Resp: 16 18  Temp: 97.7 F (36.5 C) 97.7 F (36.5 C)  SpO2:  93%    Intake/Output Summary (Last 24 hours) at 07/25/2020 1330 Last data filed at 07/25/2020 0856 Gross per 24 hour  Intake 400 ml  Output 1250 ml  Net -850 ml   Filed Weights   07/23/20 0304 07/24/20 0256 07/25/20 0249  Weight: 75.4 kg 75.4 kg 75.3 kg   Weight change: -0.045 kg Body mass index is 26.81 kg/m.   Physical Exam: General exam: Pleasant elderly Caucasian female.  Not in physical distress Skin: No rashes, lesions or ulcers. HEENT: Atraumatic, normocephalic, no obvious bleeding Lungs: Clear to auscultation bilaterally CVS: Regular rate and rhythm, no murmur GI/Abd soft, nontender, nondistended, bowel sound present CNS: Alert, awake, sleepy.  Opens eyes to answer simple questions Psychiatry: Depressed look Extremities: Pedal edema 1+ bilaterally  Data Review: I have personally reviewed the laboratory data and studies available.  Recent Labs  Lab 07/21/20 1207 07/22/20 0419 07/23/20 0521 07/24/20 0655 07/25/20 0521  WBC 9.2 7.6 5.6 6.8 7.2  NEUTROABS 7.3 6.7 4.4 5.7 5.9  HGB 14.1 13.9 13.3 14.4 14.2  HCT 44.3 43.3 41.6 43.7 44.3  MCV 89.7 89.3 89.5 89.4 87.5  PLT 116* 109* 125* 138* 153   Recent Labs  Lab 07/22/20 0419 07/23/20 0521 07/24/20 0655 07/24/20 1305 07/25/20 0521  NA 144 141 141 139 141  K 3.3* 4.1 5.3* 4.2 4.8  CL 106 105 102 103 106  CO2 27 25 26  20* 22  GLUCOSE 158* 203* 244* 313* 196*  BUN 33* 42* 52*  58* 58*  CREATININE 0.99 0.91 1.24* 1.22* 1.25*  CALCIUM 7.6* 7.3* 7.8* 7.6* 7.7*  MG 1.6* 2.0 2.1  --  2.6*  PHOS 4.6 4.2 4.9*  --  4.3    F/u labs ordered Unresulted Labs (From admission, onward)          Start     Ordered   07/28/20 0500  Creatinine, serum  (enoxaparin (LOVENOX)    CrCl >/= 30 ml/min)  Weekly,   STAT      Comments: while on enoxaparin therapy    07/21/20 1414   07/22/20 0500  CBC with Differential/Platelet  Daily,   STAT      07/21/20 1416   07/22/20 0500  Comprehensive metabolic panel  Daily,   STAT      07/21/20 1416   07/22/20 0500  C-reactive protein  Daily,   STAT      07/21/20 1416   07/22/20 0500  D-dimer, quantitative  Daily,   STAT      07/21/20 1416   07/22/20 0500  Ferritin  Daily,   STAT      07/21/20 1416   07/22/20 0500  Magnesium  Daily,   STAT      07/21/20 1416   07/22/20 0500  Phosphorus  Daily,   STAT      07/21/20 1416   07/21/20 1044  CBC WITH DIFFERENTIAL  (Undifferentiated presentation (screening labs and basic nursing orders))  ONCE - STAT,   STAT        07/21/20 1043          Signed, Terrilee Croak, MD [facility] 07/25/2020

## 2020-07-26 DIAGNOSIS — I5023 Acute on chronic systolic (congestive) heart failure: Secondary | ICD-10-CM | POA: Diagnosis not present

## 2020-07-26 LAB — CBC WITH DIFFERENTIAL/PLATELET
Abs Immature Granulocytes: 0.08 10*3/uL — ABNORMAL HIGH (ref 0.00–0.07)
Basophils Absolute: 0 10*3/uL (ref 0.0–0.1)
Basophils Relative: 0 %
Eosinophils Absolute: 0 10*3/uL (ref 0.0–0.5)
Eosinophils Relative: 0 %
HCT: 43 % (ref 36.0–46.0)
Hemoglobin: 13.5 g/dL (ref 12.0–15.0)
Immature Granulocytes: 1 %
Lymphocytes Relative: 7 %
Lymphs Abs: 0.6 10*3/uL — ABNORMAL LOW (ref 0.7–4.0)
MCH: 27.9 pg (ref 26.0–34.0)
MCHC: 31.4 g/dL (ref 30.0–36.0)
MCV: 88.8 fL (ref 80.0–100.0)
Monocytes Absolute: 0.8 10*3/uL (ref 0.1–1.0)
Monocytes Relative: 10 %
Neutro Abs: 6.6 10*3/uL (ref 1.7–7.7)
Neutrophils Relative %: 82 %
Platelets: 140 10*3/uL — ABNORMAL LOW (ref 150–400)
RBC: 4.84 MIL/uL (ref 3.87–5.11)
RDW: 16.3 % — ABNORMAL HIGH (ref 11.5–15.5)
WBC: 8.1 10*3/uL (ref 4.0–10.5)
nRBC: 0.2 % (ref 0.0–0.2)

## 2020-07-26 LAB — COMPREHENSIVE METABOLIC PANEL
ALT: 109 U/L — ABNORMAL HIGH (ref 0–44)
AST: 82 U/L — ABNORMAL HIGH (ref 15–41)
Albumin: 2.8 g/dL — ABNORMAL LOW (ref 3.5–5.0)
Alkaline Phosphatase: 335 U/L — ABNORMAL HIGH (ref 38–126)
Anion gap: 10 (ref 5–15)
BUN: 53 mg/dL — ABNORMAL HIGH (ref 8–23)
CO2: 24 mmol/L (ref 22–32)
Calcium: 7.6 mg/dL — ABNORMAL LOW (ref 8.9–10.3)
Chloride: 110 mmol/L (ref 98–111)
Creatinine, Ser: 0.99 mg/dL (ref 0.44–1.00)
GFR, Estimated: 56 mL/min — ABNORMAL LOW (ref >60)
Glucose, Bld: 79 mg/dL (ref 70–99)
Potassium: 3.9 mmol/L (ref 3.5–5.1)
Sodium: 144 mmol/L (ref 135–145)
Total Bilirubin: 1 mg/dL (ref 0.3–1.2)
Total Protein: 5.1 g/dL — ABNORMAL LOW (ref 6.5–8.1)

## 2020-07-26 LAB — GLUCOSE, CAPILLARY
Glucose-Capillary: 79 mg/dL (ref 70–99)
Glucose-Capillary: 75 mg/dL (ref 70–99)
Glucose-Capillary: 225 mg/dL — ABNORMAL HIGH (ref 70–99)
Glucose-Capillary: 115 mg/dL — ABNORMAL HIGH (ref 70–99)

## 2020-07-26 LAB — CULTURE, BLOOD (SINGLE): Culture: NO GROWTH

## 2020-07-26 LAB — C-REACTIVE PROTEIN: CRP: 4.7 mg/dL — ABNORMAL HIGH (ref <1.0)

## 2020-07-26 LAB — PHOSPHORUS: Phosphorus: 3.7 mg/dL (ref 2.5–4.6)

## 2020-07-26 LAB — D-DIMER, QUANTITATIVE: D-Dimer, Quant: 1.24 ug/mL-FEU — ABNORMAL HIGH (ref 0.00–0.50)

## 2020-07-26 LAB — FERRITIN: Ferritin: 130 ng/mL (ref 11–307)

## 2020-07-26 LAB — MAGNESIUM: Magnesium: 2.2 mg/dL (ref 1.7–2.4)

## 2020-07-26 MED ORDER — METOPROLOL SUCCINATE ER 25 MG PO TB24
25.0000 mg | ORAL_TABLET | Freq: Every day | ORAL | Status: DC
  Administered 2020-07-26 – 2020-07-29 (×4): 25 mg via ORAL
  Filled 2020-07-26 (×4): qty 1

## 2020-07-26 MED ORDER — SACUBITRIL-VALSARTAN 24-26 MG PO TABS
1.0000 | ORAL_TABLET | Freq: Two times a day (BID) | ORAL | Status: DC
  Administered 2020-07-26 – 2020-07-29 (×6): 1 via ORAL
  Filled 2020-07-26 (×6): qty 1

## 2020-07-26 NOTE — Progress Notes (Signed)
   07/26/20 2047  Assess: MEWS Score  Temp (!) 97.5 F (36.4 C)  BP (!) 117/59  Pulse Rate 65  Resp 17  SpO2 92 %  O2 Device Nasal Cannula  O2 Flow Rate (L/min) 2 L/min  Telemetry notified of Multifocal Atrial Tachycardia or Aflutter. Pt had been placed on bedpan at that time. Vitals taken and stable. Pt asymptomatic. On call Provider B. Randol Kern NP made aware. MD advised to monitor and notify for further events

## 2020-07-26 NOTE — Progress Notes (Signed)
Bladder scan showed 200 ml. Will continue to monitor.

## 2020-07-26 NOTE — Care Management Important Message (Signed)
Important Message  Patient Details  Name: Raven [name] MRN: [id number] Date of Birth: [birth year]   Medicare Important Message Given:  Yes   Reviewed Medicare IM with Elson Areas, daughter, by phone.  Copy of Medicare IM sent securely to daughter's attention at email address provided: [email or web address].     Dannette Barbara 07/26/2020, 2:02 PM

## 2020-07-26 NOTE — Plan of Care (Signed)
  Problem: Clinical Measurements: Goal: Will remain free from infection Outcome: Progressing   Problem: Clinical Measurements: Goal: Respiratory complications will improve Outcome: Progressing   Problem: Clinical Measurements: Goal: Cardiovascular complication will be avoided Outcome: Progressing   Problem: Elimination: Goal: Will not experience complications related to urinary retention Outcome: Progressing   Problem: Safety: Goal: Ability to remain free from injury will improve Outcome: Progressing

## 2020-07-26 NOTE — Progress Notes (Signed)
PROGRESS NOTE  Raven [name]  DOB: Jan 20, 1935  PCP: Baxter Hire, MD OHY:[id number]  DOA: 07/21/2020  LOS: 5 days  Hospital Day: 6   Chief Complaint  Patient presents with  . Fever  . Altered Mental Status   Brief narrative: Raven [name] is a 86 y.o. female with PMH significant for CAD,chronicsystolic CHF, last EF 45 to 50% on 03/09/2020, steroid-dependent COPD on chronic O2 at 1 to 2 L,dementia,Crohn's disease and rheumatoid arthritis who was recently hospitalized for acute on chronic systolic heart failure. 5/15, patient was brought to the ED from SNF for fever, hypoxia, altered mental status She had received her first dose of Humira 1 day prior to admission.  In the ED, she had a temperature of 100.5. She was noted to be COVID-positive Chest x-ray showed small bilateral pleural effusion as well as mild diffuse interstitial edema. Clinically in acute exacerbation of CHF Also noted to have UTI. Admitted to hospitalist service See below for details  Subjective: Patient was seen and examined this morning. Looks more awake than yesterday.  But has low blood sugar and low blood pressure.  Assessment/Plan: COVID pneumonia -Presented with fever, hypoxia, altered mental status -COVID test: PCR positive on admission -Treatment: Completed a course of IV remdesivir on 5/19.  On steroids  -Supportive care: Vitamin C, Zinc, PRN inhalers, Tylenol, Antitussives (benzonatate/ Mucinex/Tussionex).   -Encouraged incentive spirometry, prone position, out of bed and early mobilization as much as possible -WBC and inflammatory markers trend as below.  Recent Labs  Lab 07/21/20 1054 07/21/20 1207 07/21/20 1207 07/22/20 0419 07/23/20 0521 07/24/20 0655 07/25/20 0521 07/26/20 0536  SARSCOV2NAA POSITIVE*  --   --   --   --   --   --   --   WBC  --  9.2   < > 7.6 5.6 6.8 7.2 8.1  LATICACIDVEN 1.5 1.5  --   --   --   --   --   --   PROCALCITON 0.13  --   --   --   --   --    --   --   DDIMER  --   --   --  1.58* 1.14* 1.01* 0.78* 1.24*  FERRITIN  --   --   --  205 204 210 171 130  CRP  --   --   --  19.4* 14.3* 9.9* 6.9* 4.7*  ALT 100*  --   --  83* 89* 109* 111* 109*   < > = values in this interval not displayed.   Acute on chronic respiratory failure with hypoxia  -Multifactorial : COVID infection, CHF exacerbation.   -Currently on 2 L oxygen by nasal cannula which is her baseline requirement. -Oxygen - SpO2: 97 % O2 Flow Rate (L/min): 2 L/min   Acute metabolic encephalopathy -Mental status was poor yesterday 5/19.  Unclear etiology.  Mental status better today.    Acute on chronic systolic CHF -History of CHF, last echo on 03/09/2020 with EF 45 to 50%.   -Chest x-ray on presentation with small bilateral pleural effusion and diffuse interstitial edema  -BNP elevated to more than 4000.   -She was diuresed with IV Lasix and subsequently switched to oral Lasix.   -She is currently on oral metoprolol, Entresto and oral Lasix.  Blood pressures running low this morning.  Okay to hold Lasix today and skip a dose of Entresto.   -Net IO Since Admission: -2,585 mL [07/26/20 1438] -Continue to monitor  for daily intake output, weight, blood pressure, BNP, renal function and electrolytes. Recent Labs  Lab 07/21/20 1207 07/22/20 0419 07/23/20 0521 07/24/20 0655 07/24/20 1305 07/25/20 0521 07/26/20 0536  BNP >4,500.0*  --   --  >4,500.0*  --   --   --   BUN  --  33* 42* 52* 58* 58* 53*  CREATININE  --  0.99 0.91 1.24* 1.22* 1.25* 0.99  K  --  3.3* 4.1 5.3* 4.2 4.8 3.9  MG  --  1.6* 2.0 2.1  --  2.6* 2.2   Type 2 diabetes mellitus -A1c 7.5 on 4/28 -Not on any home meds. -Currently on sliding scale insulin with Accu-Cheks. -Blood sugar level was elevated and hence she was started on Lantus.  Blood sugar level running lower than 80 this morning.  Lantus was skipped. Recent Labs  Lab 07/25/20 1249 07/25/20 1658 07/25/20 2016 07/26/20 0815 07/26/20 1211   GLUCAP 172* 159* 168* 79 75   UTI -UA positive.  Urine culture with multiple species.  Currently on IV Rocephin.  COPD -Chronically on prednisone, bronchodilators.  Crohn's disease ([place]) Rheumatoid arthritis ([place]) -Patient on chronic immunosuppressive therapyand received her first dose of Humira 1 day prior to admission -Continueleflunomide and mesalamine  Mobility: Encourage ambulation.  PT eval Code Status:   Code Status: Full Code  Nutritional status: Body mass index is 26.97 kg/m.     Diet Order            Diet 2 gram sodium Room service appropriate? Yes; Fluid consistency: Thin  Diet effective now                 DVT prophylaxis: enoxaparin (LOVENOX) injection 40 mg Start: 07/21/20 1600   Antimicrobials:  None currently Fluid: None Consultants: None Family Communication:  Not at bedside  Status is: Inpatient  Remains inpatient appropriate because: Cardiac.  Dispo: The patient is from: SNF              Anticipated d/c is to: SNF in 1 to 2 days              Patient currently is not medically stable to d/c.   Difficult to place patient No     Infusions:  . sodium chloride      Scheduled Meds: . allopurinol  100 mg Oral Daily  . amitriptyline  20 mg Oral QHS  . vitamin C  500 mg Oral Daily  . aspirin EC  81 mg Oral Daily  . colchicine  0.6 mg Oral Daily  . enoxaparin (LOVENOX) injection  40 mg Subcutaneous Q24H  . folic acid  003 mcg Oral Daily  . insulin aspart  0-5 Units Subcutaneous QHS  . insulin aspart  0-9 Units Subcutaneous TID WC  . insulin glargine  10 Units Subcutaneous Daily  . leflunomide  10 mg Oral Daily  . mesalamine  500 mg Oral BID  . metoprolol succinate  25 mg Oral Daily  . multivitamin with minerals  1 tablet Oral Daily  . pantoprazole  40 mg Oral Daily  . predniSONE  20 mg Oral Daily  . sacubitril-valsartan  1 tablet Oral BID  . sodium chloride flush  3 mL Intravenous Q12H  . thiamine  100 mg Oral Daily  .  umeclidinium-vilanterol  1 puff Inhalation Daily  . vitamin B-12  500 mcg Oral Daily  . zinc sulfate  220 mg Oral Daily    Antimicrobials: Anti-infectives (From admission, onward)   Start  Dose/Rate Route Frequency Ordered Stop   07/22/20 1000  remdesivir 100 mg in sodium chloride 0.9 % 100 mL IVPB  Status:  Discontinued       "Followed by" Linked Group Details   100 mg 200 mL/hr over 30 Minutes Intravenous Daily 07/21/20 1413 07/21/20 1417   07/22/20 1000  remdesivir 100 mg in sodium chloride 0.9 % 100 mL IVPB       "Followed by" Linked Group Details   100 mg 200 mL/hr over 30 Minutes Intravenous Daily 07/21/20 1416 07/25/20 1106   07/21/20 1600  remdesivir 200 mg in sodium chloride 0.9% 250 mL IVPB  Status:  Discontinued       "Followed by" Linked Group Details   200 mg 580 mL/hr over 30 Minutes Intravenous Once 07/21/20 1413 07/21/20 1417   07/21/20 1600  cefTRIAXone (ROCEPHIN) 1 g in sodium chloride 0.9 % 100 mL IVPB        1 g 200 mL/hr over 30 Minutes Intravenous Every 24 hours 07/21/20 1508 07/25/20 1600   07/21/20 1430  remdesivir 200 mg in sodium chloride 0.9% 250 mL IVPB       "Followed by" Linked Group Details   200 mg 580 mL/hr over 30 Minutes Intravenous Once 07/21/20 1416 07/21/20 1902   07/21/20 1345  ciprofloxacin (CIPRO) IVPB 400 mg  Status:  Discontinued        400 mg 200 mL/hr over 60 Minutes Intravenous  Once 07/21/20 1344 07/21/20 1508      PRN meds: sodium chloride, acetaminophen, acetaminophen, albuterol, dextromethorphan-guaiFENesin, ondansetron (ZOFRAN) IV, sodium chloride flush, traZODone   Objective: Vitals:   07/26/20 0817 07/26/20 1212  BP: 95/73 108/77  Pulse: 75 84  Resp: 19 20  Temp: 97.8 F (36.6 C) 97.9 F (36.6 C)  SpO2: 95% 94%    Intake/Output Summary (Last 24 hours) at 07/26/2020 1438 Last data filed at 07/26/2020 1435 Gross per 24 hour  Intake 960 ml  Output 1550 ml  Net -590 ml   Filed Weights   07/24/20 0256 07/25/20  0249 07/26/20 0411  Weight: 75.4 kg 75.3 kg 75.8 kg   Weight change: 0.454 kg Body mass index is 26.97 kg/m.   Physical Exam: General exam: Pleasant elderly Caucasian female.  Not in physical distress Skin: No rashes, lesions or ulcers. HEENT: Atraumatic, normocephalic, no obvious bleeding Lungs: Clear to auscultation bilaterally CVS: Regular rate and rhythm, no murmur GI/Abd soft, nontender, nondistended, bowel sound present CNS: Alert, awake, sleepy.  Opens eyes to answer simple questions Psychiatry: Depressed look Extremities: Pedal edema 1+ bilaterally  Data Review: I have personally reviewed the laboratory data and studies available.  Recent Labs  Lab 07/22/20 0419 07/23/20 0521 07/24/20 0655 07/25/20 0521 07/26/20 0536  WBC 7.6 5.6 6.8 7.2 8.1  NEUTROABS 6.7 4.4 5.7 5.9 6.6  HGB 13.9 13.3 14.4 14.2 13.5  HCT 43.3 41.6 43.7 44.3 43.0  MCV 89.3 89.5 89.4 87.5 88.8  PLT 109* 125* 138* 153 140*   Recent Labs  Lab 07/22/20 0419 07/23/20 0521 07/24/20 0655 07/24/20 1305 07/25/20 0521 07/26/20 0536  NA 144 141 141 139 141 144  K 3.3* 4.1 5.3* 4.2 4.8 3.9  CL 106 105 102 103 106 110  CO2 27 25 26  20* 22 24  GLUCOSE 158* 203* 244* 313* 196* 79  BUN 33* 42* 52* 58* 58* 53*  CREATININE 0.99 0.91 1.24* 1.22* 1.25* 0.99  CALCIUM 7.6* 7.3* 7.8* 7.6* 7.7* 7.6*  MG 1.6* 2.0 2.1  --  2.6* 2.2  PHOS 4.6 4.2 4.9*  --  4.3 3.7    F/u labs ordered Unresulted Labs (From admission, onward)          Start     Ordered   07/28/20 0500  Creatinine, serum  (enoxaparin (LOVENOX)    CrCl >/= 30 ml/min)  Weekly,   STAT     Comments: while on enoxaparin therapy    07/21/20 1414   07/21/20 1044  CBC WITH DIFFERENTIAL  (Undifferentiated presentation (screening labs and basic nursing orders))  ONCE - STAT,   STAT        07/21/20 1043          Signed, Terrilee Croak, MD [facility] 07/26/2020

## 2020-07-27 DIAGNOSIS — I5023 Acute on chronic systolic (congestive) heart failure: Secondary | ICD-10-CM | POA: Diagnosis not present

## 2020-07-27 LAB — GLUCOSE, CAPILLARY
Glucose-Capillary: 46 mg/dL — ABNORMAL LOW (ref 70–99)
Glucose-Capillary: 56 mg/dL — ABNORMAL LOW (ref 70–99)
Glucose-Capillary: 59 mg/dL — ABNORMAL LOW (ref 70–99)
Glucose-Capillary: 59 mg/dL — ABNORMAL LOW (ref 70–99)
Glucose-Capillary: 97 mg/dL (ref 70–99)
Glucose-Capillary: 136 mg/dL — ABNORMAL HIGH (ref 70–99)
Glucose-Capillary: 134 mg/dL — ABNORMAL HIGH (ref 70–99)
Glucose-Capillary: 168 mg/dL — ABNORMAL HIGH (ref 70–99)

## 2020-07-27 MED ORDER — GLUCOSE 40 % PO GEL
2.0000 | ORAL | Status: AC
  Administered 2020-07-27: 62 g via ORAL
  Filled 2020-07-27: qty 1

## 2020-07-27 MED ORDER — TORSEMIDE 20 MG PO TABS
20.0000 mg | ORAL_TABLET | Freq: Every day | ORAL | Status: DC
  Administered 2020-07-27 – 2020-07-29 (×3): 20 mg via ORAL
  Filled 2020-07-27 (×3): qty 1

## 2020-07-27 MED ORDER — PREDNISONE 20 MG PO TABS: 20.0000 mg | ORAL_TABLET | Freq: Every day | ORAL | 0 refills | Status: AC

## 2020-07-27 NOTE — Progress Notes (Signed)
Physical Therapy Treatment Patient Details Name: Raven [name] MRN: [id number] DOB: [birth year] Today's Date: 07/27/2020    History of Present Illness Raven [name] is an 23yoF who comes to [facility] on 5/15 c fever. PMH: CAD, sCHF on enestro and metoprolol, EF 45-50%, steroid dependent COPD on 1-2L at home, dementia, Crohn's disease, RA, had been on Humira for years, recently stopped due to concerns over cadiac affects, has felt sick ever since.[name] Upon arrival, BNP >4500, (+) Covid 5/15.    PT Comments    Pt found at EOB eyes closed, but no real sleeping, breakfast tray in disarray, but clearly finished eating. Pt looks to be exhausted, says she feels quite bad and lost her drive to finish food or drink, her tray then moved so near the sink. Gave trunk support to achieve tall sitting, assist of legs for strengthening fitting, as pt shows engrossing weakness, pt more fatigued and listless, compared to eval 3 days prior. Pt fearful of standing attempt, but agreeable with encouragement, can rise to standing bed raised high, assist of trunk author blocking the thigh, as buckling of knee remains of concern, with cues patient shows capacity to learn safe use with DME. After 2 times up and clearly exhausted, pt assisted back into supine, she assists with great effort to slide self up in bed, then left slightly reclined. Toes somewhat purple at EOB, largely resolved after seated heel raises. SpO2 WNL entire session with 2L O2 donned.    Follow Up Recommendations  SNF;Supervision for mobility/OOB     Equipment Recommendations  None recommended by PT    Recommendations for Other Services       Precautions / Restrictions Precautions Precautions: Fall Restrictions Weight Bearing Restrictions: No    Mobility  Bed Mobility Overal bed mobility: Needs Assistance Bed Mobility: Sit to Supine     Supine to sit: Max assist;+2 for physical assistance          Transfers Overall transfer level: Needs  assistance Equipment used: Rolling walker (2 wheeled) Transfers: Sit to/from Stand Sit to Stand: Mod assist;From elevated surface         General transfer comment: twice with extensive assist, tolerates <20sec upright  Ambulation/Gait Ambulation/Gait assistance:  (to weak to attempt)               Stairs             Wheelchair Mobility    Modified Rankin (Stroke Patients Only)       Balance                                            Cognition Arousal/Alertness: Lethargic;Awake/alert Behavior During Therapy: [facility] for tasks assessed/performed;Flat affect                                          Exercises General Exercises - Lower Extremity Long Arc Quad: AROM;10 reps;Seated;Both;AAROM;Limitations Long Arc Quad Limitations: 2 sets, very weak, especially left Hip Flexion/Marching: AAROM;Both;10 reps;Seated;Limitations Hip Flexion/Marching Limitations: very weak, requires maxA Left, modA Right Heel Raises: AROM;Both;20 reps;Seated    General Comments        Pertinent Vitals/Pain Pain Assessment: No/denies pain    Home Living  Prior Function            PT Goals (current goals can now be found in the care plan section) Acute Rehab PT Goals Patient Stated Goal: to get moving again PT Goal Formulation: With patient Time For Goal Achievement: 08/23/2020 Potential to Achieve Goals: Poor Progress towards PT goals: Not progressing toward goals - comment    Frequency    Min 2X/week      PT Plan Current plan remains appropriate    Co-evaluation              AM-PAC PT "6 Clicks" Mobility   Outcome Measure  Help needed turning from your back to your side while in a flat bed without using bedrails?: Total Help needed moving from lying on your back to sitting on the side of a flat bed without using bedrails?: Total Help needed moving to and from a bed to a chair (including a  wheelchair)?: Total Help needed standing up from a chair using your arms (e.g., wheelchair or bedside chair)?: Total Help needed to walk in hospital room?: Total Help needed climbing 3-5 steps with a railing? : Total 6 Click Score: 6    End of Session Equipment Utilized During Treatment: Gait belt;Oxygen Activity Tolerance: Patient limited by pain;Treatment limited secondary to agitation;Patient limited by fatigue Patient left: with call bell/phone within reach;in bed;with chair alarm set Nurse Communication: Mobility status PT Visit Diagnosis: Difficulty in walking, not elsewhere classified (R26.2);Muscle weakness (generalized) (M62.81);Other abnormalities of gait and mobility (R26.89);Unsteadiness on feet (R26.81)     Time: 3546-5681 PT Time Calculation (min) (ACUTE ONLY): 23 min  Charges:  $Therapeutic Exercise: 23-37 mins                     12:21 PM, 07/27/20 Etta Grandchild, PT, DPT Physical Therapist - [facility]  [phone] ([place])    [place] C 07/27/2020, 12:08 PM

## 2020-07-27 NOTE — Plan of Care (Addendum)
Pt rested during overnight. Guaifenesin given due to constant cough and thick yellow sputum. Pt started on incentive spirometer per orders. Pt achieved 750 and was noted 2 x completing without RN initiating. Pt had bm at hs. Purewick continues and foam changed to bottom due to soiled. Redness to sacrum noted and general edema noted. Pt did have a slight bit of confusion during the overnight and with coaching was reoriented.  Problem: Education: Goal: Knowledge of General Education information will improve Description: Including pain rating scale, medication(s)/side effects and non-pharmacologic comfort measures Outcome: Progressing   Problem: Health Behavior/Discharge Planning: Goal: Ability to manage health-related needs will improve Outcome: Progressing   Problem: Clinical Measurements: Goal: Ability to maintain clinical measurements within normal limits will improve Outcome: Progressing Goal: Will remain free from infection Outcome: Progressing Goal: Diagnostic test results will improve Outcome: Progressing Goal: Respiratory complications will improve Outcome: Progressing Goal: Cardiovascular complication will be avoided Outcome: Progressing   Problem: Activity: Goal: Risk for activity intolerance will decrease Outcome: Progressing   Problem: Nutrition: Goal: Adequate nutrition will be maintained Outcome: Progressing   Problem: Coping: Goal: Level of anxiety will decrease Outcome: Progressing   Problem: Elimination: Goal: Will not experience complications related to bowel motility Outcome: Progressing Goal: Will not experience complications related to urinary retention Outcome: Progressing   Problem: Pain Managment: Goal: General experience of comfort will improve Outcome: Progressing   Problem: Safety: Goal: Ability to remain free from injury will improve Outcome: Progressing   Problem: Skin Integrity: Goal: Risk for impaired skin integrity will decrease Outcome:  Progressing

## 2020-07-27 NOTE — Progress Notes (Signed)
Hypoglycemic Event  CBG: 56  Treatment: 4 ounces orange juice   Symptoms: pt stated she felt tired but alert and oriented  Follow-up CBG: Time:0825 CBG Result:46  Possible Reasons for Event: refused bedtime snack   Comments/MD notified:Dr.Dahal  0830 2 tubes dextrose given/repeat CBG at 0850 was 97. Will continue to monitor.     Raven [name] H[place]

## 2020-07-27 NOTE — Discharge Summary (Addendum)
Physician Discharge Summary  KHYLA MCCUMBERS BMW:[id number] DOB: 02-May-1934 DOA: 07/21/2020  PCP: Baxter Hire, MD  Admit date: 07/21/2020 Discharge date: 07/29/2020  Admitted From: SNF Discharge disposition: Back to SNF   Code Status: Full Code  Diet Recommendation: Cardiac diet  Discharge Diagnosis:   Principal Problem:   Acute on chronic systolic CHF (congestive heart failure) ([place]) Active Problems:   COPD (chronic obstructive pulmonary disease) ([place])   Depression with anxiety   Transaminitis   HTN (hypertension)   Crohn's disease ([place])   Chronic respiratory failure with hypoxia ([place])   COVID-19 virus detected   Acute lower UTI  Chief Complaint  Patient presents with  . Fever  . Altered Mental Status   Brief narrative: Raven [name] is a 86 y.o. female with PMH significant for CAD,chronicsystolic CHF, last EF 45 to 50% on 03/09/2020, steroid-dependent COPD on chronic O2 at 1 to 2 L,dementia,Crohn's disease and rheumatoid arthritis who was recently hospitalized for acute on chronic systolic heart failure. 5/15, patient was brought to the ED from SNF for fever, hypoxia, altered mental status She had received her first dose of Humira 1 day prior to admission.  In the ED, she had a temperature of 100.5. She was noted to be COVID-positive Chest x-ray showed small bilateral pleural effusion as well as mild diffuse interstitial edema. Clinically in acute exacerbation of CHF Also noted to have UTI. Admitted to hospitalist service See below for details  Subjective: Patient was seen and examined this morning. Propped up in bed.  Not in distress.  Looks drowsy.  No new complaint.  Blood pressure and blood sugar level stable.  Hospital course: COVID pneumonia -Presented with fever, hypoxia, altered mental status -COVID test: PCR positive on admission -Treatment: Completed a course of IV remdesivir on 5/19.  On steroids, to complete the course the next 3 days -WBC  and inflammatory markers trend as below. Recent Labs  Lab 07/23/20 0521 07/24/20 0655 07/25/20 0521 07/26/20 0536  WBC 5.6 6.8 7.2 8.1  DDIMER 1.14* 1.01* 0.78* 1.24*  FERRITIN 204 210 171 130  CRP 14.3* 9.9* 6.9* 4.7*  ALT 89* 109* 111* 109*   Acute on chronic respiratory failure with hypoxia  -Multifactorial : COVID infection, CHF exacerbation.   -Currently on 2 L oxygen by nasal cannula which is her baseline requirement.  Acute metabolic encephalopathy -Mental status was poor yesterday 5/19.  Unclear etiology.  Mental status better today.    Acute on chronic systolic CHF -History of CHF, last echo on 03/09/2020 with EF 45 to 50%.   -Chest x-ray on presentation with small bilateral pleural effusion and diffuse interstitial edema  -BNP elevated to more than 4000.   -She was diuresed with IV Lasix and subsequently switched to oral Lasix.   -She is currently on oral metoprolol, Entresto and oral Lasix.  Continue the same post discharge -Net IO Since Admission: -2,695 mL [07/29/20 1025]  Type 2 diabetes mellitus Hypoglycemic episodes -A1c 7.5 on 4/28 -Not on any home meds. -While in the hospital, her blood sugar levels were running high because of steroids and hence insulin was used.  Blood sugar level started to drop. No need of insulin at discharge  UTI -UA positive.  Urine culture with multiple species.  Completed 5 days of IV Rocephin.  No need to continue antibiotic was discharged  COPD -Chronically on prednisone, bronchodilators.  Crohn's disease ([place]) Rheumatoid arthritis ([place]) -Patient on chronic immunosuppressive therapyand received her first dose of Humira 1  day prior to admission -Continueleflunomide and mesalamine  Stable to discharge back to SNF today.  Wound care:    Discharge Exam:   Vitals:   07/28/20 2032 07/29/20 0115 07/29/20 0501 07/29/20 0827  BP: (!) 148/61  (!) 133/103 (!) 154/73  Pulse: 67  93 87  Resp: 19  19 16   Temp: 97.9 F (36.6  C)  98.2 F (36.8 C) 97.7 F (36.5 C)  TempSrc: Oral  Oral Oral  SpO2: 96%  97% 95%  Weight:  81.1 kg    Height:        Body mass index is 28.88 kg/m.  General exam: Pleasant, not in distress. Skin: No rashes, lesions or ulcers. HEENT: Atraumatic, normocephalic, no obvious bleeding Lungs: Clear to auscultation bilaterally CVS: Regular rate, no murmur GI/Abd soft, nontender, nondistended, bowel sound present CNS: Alert, awake, oriented x3 Psychiatry: Depressed look Extremities: Trace bilateral pedal edema.  No calf tenderness  Follow ups:   Discharge Instructions    Diet - low sodium heart healthy   Complete by: As directed    Diet Carb Modified   Complete by: As directed    Increase activity slowly   Complete by: As directed       Follow-up Information    Baxter Hire, MD Follow up.   Specialty: Internal Medicine Contact information: [place] [address] [phone]               Recommendations for Outpatient Follow-Up:   1. Follow-up with PCP as an outpatient  Discharge Instructions:  Follow with Primary MD Baxter Hire, MD in 7 days   Get CBC/BMP checked in next visit within 1 week by PCP or SNF MD ( we routinely change or add medications that can affect your baseline labs and fluid status, therefore we recommend that you get the mentioned basic workup next visit with your PCP, your PCP may decide not to get them or add new tests based on their clinical decision)  On your next visit with your PCP, please Get Medicines reviewed and adjusted.  Please request your PCP  to go over all Hospital Tests and Procedure/Radiological results at the follow up, please get all Hospital records sent to your Prim MD by signing hospital release before you go home.  Activity: As tolerated with Full fall precautions use walker/cane & assistance as needed  For Heart failure patients - Check your Weight same time everyday, if you gain over 2  pounds, or you develop in leg swelling, experience more shortness of breath or chest pain, call your Primary MD immediately. Follow Cardiac Low Salt Diet and 1.5 lit/day fluid restriction.  If you have smoked or chewed Tobacco in the last 2 yrs please stop smoking, stop any regular Alcohol  and or any Recreational drug use.  If you experience worsening of your admission symptoms, develop shortness of breath, life threatening emergency, suicidal or homicidal thoughts you must seek medical attention immediately by calling 911 or calling your MD immediately  if symptoms less severe.  You Must read complete instructions/literature along with all the possible adverse reactions/side effects for all the Medicines you take and that have been prescribed to you. Take any new Medicines after you have completely understood and accpet all the possible adverse reactions/side effects.   Do not drive, operate heavy machinery, perform activities at heights, swimming or participation in water activities or provide baby sitting services if your were admitted for syncope or siezures until you  have seen by Primary MD or a Neurologist and advised to do so again.  Do not drive when taking Pain medications.  Do not take more than prescribed Pain, Sleep and Anxiety Medications  Wear Seat belts while driving.   Please note You were cared for by a hospitalist during your hospital stay. If you have any questions about your discharge medications or the care you received while you were in the hospital after you are discharged, you can call the unit and asked to speak with the hospitalist on call if the hospitalist that took care of you is not available. Once you are discharged, your primary care physician will handle any further medical issues. Please note that NO REFILLS for any discharge medications will be authorized once you are discharged, as it is imperative that you return to your primary care physician (or establish a  relationship with a primary care physician if you do not have one) for your aftercare needs so that they can reassess your need for medications and monitor your lab values.    Allergies as of 07/29/2020      Reactions   Penicillins Other (See Comments)   Reaction:  Unknown  Has patient had a PCN reaction causing immediate rash, facial/tongue/throat swelling, SOB or lightheadedness with hypotension: unknown Has patient had a PCN reaction causing severe rash involving mucus membranes or skin necrosis: unknown Has patient had a PCN reaction that required hospitalization No Has patient had a PCN reaction occurring within the last 10 years: No If all of the above answers are "NO", then may proceed with Cephalosporin use.   Augmentin [amoxicillin-pot Clavulanate] Other (See Comments)   Reaction:  Unknown    Indocin [indomethacin] Other (See Comments)   Reaction:  Unknown    Lodine [etodolac] Other (See Comments)   Reaction:   GI upset    Methotrexate Derivatives Other (See Comments)   Reaction:  GI upset    Gold Rash   Zantac [ranitidine Hcl] Rash      Medication List    TAKE these medications   acetaminophen 500 MG tablet Commonly known as: TYLENOL Take 500-1,000 mg by mouth every 6 (six) hours as needed for mild pain, moderate pain or fever.   Adalimumab 40 MG/0.8ML Pskt Inject 40 mg into the skin every 30 (thirty) days.   albuterol 108 (90 Base) MCG/ACT inhaler Commonly known as: VENTOLIN HFA Inhale 2 puffs into the lungs every 6 (six) hours as needed for wheezing or shortness of breath.   allopurinol 100 MG tablet Commonly known as: Zyloprim Take 1 tablet (100 mg total) by mouth daily.   amitriptyline 10 MG tablet Commonly known as: ELAVIL Take 20 mg by mouth at bedtime.   Anoro Ellipta 62.5-25 MCG/INH Aepb Generic drug: umeclidinium-vilanterol Inhale 1 puff into the lungs daily.   aspirin EC 81 MG tablet Take 81 mg by mouth daily.   atorvastatin 80 MG  tablet Commonly known as: LIPITOR Take 80 mg by mouth daily.   chlorpheniramine-HYDROcodone 10-8 MG/5ML Suer Commonly known as: TUSSIONEX Take 5 mLs by mouth every 12 (twelve) hours as needed (cough unrelieved with robitussen).   CITRACAL +D3 PO Take 1 tablet by mouth daily.   colchicine 0.6 MG tablet Take 1 tablet (0.6 mg total) by mouth daily.   dextromethorphan-guaiFENesin 30-600 MG 12hr tablet Commonly known as: MUCINEX DM Take 1 tablet by mouth 2 (two) times daily as needed for cough.   Entresto 24-26 MG Generic drug: sacubitril-valsartan TAKE ONE TABLET BY  MOUTH TWICE DAILY   esomeprazole 40 MG capsule Commonly known as: NEXIUM Take 40 mg by mouth daily.   folic acid 510 MCG tablet Commonly known as: FOLVITE Take 400 mcg by mouth daily.   furosemide 40 MG tablet Commonly known as: LASIX Take 40 mg by mouth daily.   leflunomide 10 MG tablet Commonly known as: ARAVA Take 10 mg by mouth daily.   mesalamine 500 MG CR capsule Commonly known as: PENTASA Take 500 mg by mouth 2 (two) times daily.   metoprolol succinate 25 MG 24 hr tablet Commonly known as: TOPROL-XL Take 3 tablets (75 mg total) by mouth daily. Take with or immediately following a meal.   multivitamin with minerals Tabs tablet Take 1 tablet by mouth daily.   ondansetron 4 MG disintegrating tablet Commonly known as: ZOFRAN-ODT Take 4 mg by mouth every 12 (twelve) hours as needed for nausea/vomiting.   potassium chloride 10 MEQ tablet Commonly known as: KLOR-CON Take 10 mEq by mouth daily.   predniSONE 20 MG tablet Commonly known as: DELTASONE Take 1 tablet (20 mg total) by mouth daily for 3 days. What changed:   medication strength  how much to take  how to take this  when to take this  additional instructions   thiamine 100 MG tablet Commonly known as: Vitamin B-1 Take 100 mg by mouth daily.   torsemide 20 MG tablet Commonly known as: DEMADEX Take 1 tablet (20 mg total) by  mouth 2 (two) times daily.   traZODone 50 MG tablet Commonly known as: DESYREL Take 1 tablet (50 mg total) by mouth at bedtime as needed for sleep.   Vitamin B12-Folic Acid 258-527 MCG Tabs Take 1 tablet by mouth as directed.   vitamin E 180 MG (400 UNITS) capsule Take 400 Units by mouth daily.       Time coordinating discharge: 35 minutes  The results of significant diagnostics from this hospitalization (including imaging, microbiology, ancillary and laboratory) are listed below for reference.    Procedures and Diagnostic Studies:   CT Head Wo Contrast  Result Date: 07/21/2020 CLINICAL DATA:  Altered mental status EXAM: CT HEAD WITHOUT CONTRAST TECHNIQUE: Contiguous axial images were obtained from the base of the skull through the vertex without intravenous contrast. COMPARISON:  None. FINDINGS: Brain: No evidence of acute infarction, hemorrhage, hydrocephalus, extra-axial collection or mass lesion/mass effect. Mild periventricular white matter hypodensity. Vascular: No hyperdense vessel or unexpected calcification. Skull: Normal. Negative for fracture or focal lesion. Sinuses/Orbits: No acute finding. Other: None. IMPRESSION: No acute intracranial pathology. Small-vessel white matter disease. Electronically Signed   By: Eddie Candle M.D.   On: 07/21/2020 12:33   DG Chest Port 1 View  Result Date: 07/21/2020 CLINICAL DATA:  evaluate for sepsis. EXAM: PORTABLE CHEST 1 VIEW COMPARISON:  07/08/2020 FINDINGS: Normal heart size. Unchanged asymmetric elevation of the right hemidiaphragm. Small pleural effusions are noted, right greater than left. Mild diffuse interstitial edema is stable to increased in the interval. IMPRESSION: Bilateral pleural effusions and interstitial edema.  Unchanged. Electronically Signed   By: Kerby Moors M.D.   On: 07/21/2020 11:20   US ABDOMEN LIMITED RUQ (LIVER/GB)  Result Date: 07/21/2020 CLINICAL DATA:  Transaminitis EXAM: ULTRASOUND ABDOMEN LIMITED RIGHT  UPPER QUADRANT COMPARISON:  Abdominal ultrasound 06/13/2020 FINDINGS: Gallbladder: No gallstones or wall thickening visualized. No sonographic Murphy sign noted by sonographer. Common bile duct: Diameter: 0.5 cm, within normal limits Liver: No focal lesion identified. Within normal limits in parenchymal echogenicity. Portal vein is  patent on color Doppler imaging with normal direction of blood flow towards the liver. Other: None. IMPRESSION: Unremarkable sonographic exam of the right upper quadrant. Electronically Signed   By: Audie Pinto M.D.   On: 07/21/2020 12:25     Labs:   Basic Metabolic Panel: Recent Labs  Lab 07/23/20 0521 07/24/20 0655 07/24/20 1305 07/25/20 0521 07/26/20 0536 07/28/20 0548  NA 141 141 139 141 144  --   K 4.1 5.3* 4.2 4.8 3.9  --   CL 105 102 103 106 110  --   CO2 25 26 20* 22 24  --   GLUCOSE 203* 244* 313* 196* 79  --   BUN 42* 52* 58* 58* 53*  --   CREATININE 0.91 1.24* 1.22* 1.25* 0.99 0.92  CALCIUM 7.3* 7.8* 7.6* 7.7* 7.6*  --   MG 2.0 2.1  --  2.6* 2.2  --   PHOS 4.2 4.9*  --  4.3 3.7  --    GFR Estimated Creatinine Clearance: 48.9 mL/min (by C-G formula based on SCr of 0.92 mg/dL). Liver Function Tests: Recent Labs  Lab 07/23/20 0521 07/24/20 0655 07/25/20 0521 07/26/20 0536  AST 91* 94* 78* 82*  ALT 89* 109* 111* 109*  ALKPHOS 245* 313* 333* 335*  BILITOT 0.7 0.8 0.9 1.0  PROT 5.1* 5.7* 5.5* 5.1*  ALBUMIN 2.6* 3.0* 2.9* 2.8*   No results for input(s): LIPASE, AMYLASE in the last 168 hours. No results for input(s): AMMONIA in the last 168 hours. Coagulation profile No results for input(s): INR, PROTIME in the last 168 hours.  CBC: Recent Labs  Lab 07/23/20 0521 07/24/20 0655 07/25/20 0521 07/26/20 0536  WBC 5.6 6.8 7.2 8.1  NEUTROABS 4.4 5.7 5.9 6.6  HGB 13.3 14.4 14.2 13.5  HCT 41.6 43.7 44.3 43.0  MCV 89.5 89.4 87.5 88.8  PLT 125* 138* 153 140*   Cardiac Enzymes: No results for input(s): CKTOTAL, CKMB, CKMBINDEX,  TROPONINI in the last 168 hours. BNP: Invalid input(s): POCBNP CBG: Recent Labs  Lab 07/28/20 0840 07/28/20 1119 07/28/20 1704 07/28/20 2033 07/29/20 0815  GLUCAP 117* 112* 195* 168* 114*   D-Dimer No results for input(s): DDIMER in the last 72 hours. Hgb A1c No results for input(s): HGBA1C in the last 72 hours. Lipid Profile No results for input(s): CHOL, HDL, LDLCALC, TRIG, CHOLHDL, LDLDIRECT in the last 72 hours. Thyroid function studies No results for input(s): TSH, T4TOTAL, T3FREE, THYROIDAB in the last 72 hours.  Invalid input(s): FREET3 Anemia work up No results for input(s): VITAMINB12, FOLATE, FERRITIN, TIBC, IRON, RETICCTPCT in the last 72 hours. Microbiology Recent Results (from the past 240 hour(s))  Resp Panel by RT-PCR (Flu A&B, Covid) Nasopharyngeal Swab     Status: Abnormal   Collection Time: 07/21/20 10:54 AM   Specimen: Nasopharyngeal Swab; Nasopharyngeal(NP) swabs in vial transport medium  Result Value Ref Range Status   SARS Coronavirus 2 by RT PCR POSITIVE (A) NEGATIVE Final    Comment: RESULT CALLED TO, READ BACK BY AND VERIFIED WITH: JESSICA COLTRANE AT 3299 07/21/20.PMF (NOTE) SARS-CoV-2 target nucleic acids are DETECTED.  The SARS-CoV-2 RNA is generally detectable in upper respiratory specimens during the acute phase of infection. Positive results are indicative of the presence of the identified virus, but do not rule out bacterial infection or co-infection with other pathogens not detected by the test. Clinical correlation with patient history and other diagnostic information is necessary to determine patient infection status. The expected result is Negative.  Fact Sheet for  Patients: EntrepreneurPulse.com.au  Fact Sheet for Healthcare Providers: IncredibleEmployment.be  This test is not yet approved or cleared by the [place] FDA and  has been authorized for detection and/or diagnosis of SARS-CoV-2  by FDA under an Emergency Use Authorization (EUA).  This EUA will remain in effect (meaning this test can  be used) for the duration of  the COVID-19 declaration under Section 564(b)(1) of the Act, 21 U.S.C. section 360bbb-3(b)(1), unless the authorization is terminated or revoked sooner.     Influenza A by PCR NEGATIVE NEGATIVE Final   Influenza B by PCR NEGATIVE NEGATIVE Final    Comment: (NOTE) The Xpert Xpress SARS-CoV-2/FLU/RSV plus assay is intended as an aid in the diagnosis of influenza from Nasopharyngeal swab specimens and should not be used as a sole basis for treatment. Nasal washings and aspirates are unacceptable for Xpert Xpress SARS-CoV-2/FLU/RSV testing.  Fact Sheet for Patients: EntrepreneurPulse.com.au  Fact Sheet for Healthcare Providers: IncredibleEmployment.be  This test is not yet approved or cleared by the [place] FDA and has been authorized for detection and/or diagnosis of SARS-CoV-2 by FDA under an Emergency Use Authorization (EUA). This EUA will remain in effect (meaning this test can be used) for the duration of the COVID-19 declaration under Section 564(b)(1) of the Act, 21 U.S.C. section 360bbb-3(b)(1), unless the authorization is terminated or revoked.  Performed at [facility], [address] [place], [place] [address]   Blood culture (routine single)     Status: None   Collection Time: 07/21/20 10:54 AM   Specimen: BLOOD  Result Value Ref Range Status   Specimen Description BLOOD RIGHT ANTECUBITAL  Final   Special Requests   Final    BOTTLES DRAWN AEROBIC AND ANAEROBIC Blood Culture results may not be optimal due to an inadequate volume of blood received in culture bottles   Culture   Final    NO GROWTH 5 DAYS Performed at [facility], [address], [place], [place] [address]    Report Status 07/26/2020 FINAL  Final  Urine culture     Status: Abnormal   Collection Time:  07/21/20  1:15 PM   Specimen: Urine, Random  Result Value Ref Range Status   Specimen Description   Final    URINE, RANDOM Performed at [facility], [address] [place], [place] [address]    Special Requests   Final    NONE Performed at [facility], [address] [place], [place] [address]    Culture MULTIPLE SPECIES PRESENT, SUGGEST RECOLLECTION (A)  Final   Report Status 07/22/2020 FINAL  Final  C Difficile Quick Screen w PCR reflex     Status: None   Collection Time: 07/23/20  4:45 PM   Specimen: STOOL  Result Value Ref Range Status   C Diff antigen NEGATIVE NEGATIVE Final   C Diff toxin NEGATIVE NEGATIVE Final   C Diff interpretation No C. difficile detected.  Final    Comment: Performed at [facility], [address] [place], [place] [address]     Signed: Terrilee Croak  [facility] 07/29/2020, 10:25 AM

## 2020-07-27 NOTE — TOC Progression Note (Signed)
Transition of Care [facility]) - Progression Note    Patient Details  Name: Raven [name] MRN: [id number] Date of Birth: [birth year]  Transition of Care [facility]) CM/SW Contact  Izola Price, RN Phone Number: 07/27/2020, 2:06 PM  Clinical Narrative: 1400 07/27/20. Magda Paganini from [facility] returned my call/VM regarding re-admit of patient. [facility], stated they are short staffed and will take patient back Monday. Notified staff and provider. Simmie Davies RN CM       Expected Discharge Plan: Skilled Nursing Facility Barriers to Discharge: Continued Medical Work up  Expected Discharge Plan and Services Expected Discharge Plan: [place]       Living arrangements for the past 2 months: [place] ([place]) Expected Discharge Date: 07/27/20                                     Social Determinants of Health (SDOH) Interventions    Readmission Risk Interventions Readmission Risk Prevention Plan 03/11/2020 11/25/2019  Transportation Screening Complete Complete  Medication Review Press photographer) Complete Complete  PCP or Specialist appointment within 3-5 days of discharge Complete Complete  HRI or Home Care Consult Complete Complete  SW Recovery Care/Counseling Consult Complete Complete  Palliative Care Screening Not Applicable Not [place] Complete Not Applicable  Some recent data might be hidden

## 2020-07-28 DIAGNOSIS — I5023 Acute on chronic systolic (congestive) heart failure: Secondary | ICD-10-CM | POA: Diagnosis not present

## 2020-07-28 LAB — GLUCOSE, CAPILLARY
Glucose-Capillary: 147 mg/dL — ABNORMAL HIGH (ref 70–99)
Glucose-Capillary: 117 mg/dL — ABNORMAL HIGH (ref 70–99)
Glucose-Capillary: 112 mg/dL — ABNORMAL HIGH (ref 70–99)
Glucose-Capillary: 195 mg/dL — ABNORMAL HIGH (ref 70–99)

## 2020-07-28 LAB — CREATININE, SERUM
Creatinine, Ser: 0.92 mg/dL (ref 0.44–1.00)
GFR, Estimated: >60 mL/min (ref >60)

## 2020-07-28 NOTE — Progress Notes (Signed)
poct 163 at 2030

## 2020-07-28 NOTE — Plan of Care (Signed)
  Problem: Health Behavior/Discharge Planning: Goal: Ability to manage health-related needs will improve Outcome: Progressing   Problem: Clinical Measurements: Goal: Respiratory complications will improve Outcome: Progressing   Problem: Clinical Measurements: Goal: Cardiovascular complication will be avoided Outcome: Progressing   Problem: Safety: Goal: Ability to remain free from injury will improve Outcome: Progressing

## 2020-07-28 NOTE — Progress Notes (Signed)
PROGRESS NOTE  Raven [name]  DOB: 1934/06/20  PCP: Baxter Hire, MD [id number]  DOA: 07/21/2020  LOS: 7 days  Hospital Day: 8   Chief Complaint  Patient presents with  . Fever  . Altered Mental Status   Brief narrative: Raven [name] is a 86 y.o. female with PMH significant for CAD,chronicsystolic CHF, last EF 45 to 50% on 03/09/2020, steroid-dependent COPD on chronic O2 at 1 to 2 L,dementia,Crohn's disease and rheumatoid arthritis who was recently hospitalized for acute on chronic systolic heart failure. 5/15, patient was brought to the ED from SNF for fever, hypoxia, altered mental status She had received her first dose of Humira 1 day prior to admission.  In the ED, she had a temperature of 100.5. She was noted to be COVID-positive Chest x-ray showed small bilateral pleural effusion as well as mild diffuse interstitial edema. Clinically in acute exacerbation of CHF Also noted to have UTI. Admitted to hospitalist service See below for details  Subjective: Patient was seen and examined this morning. Propped up in bed.  Not in distress.  Looks drowsy.  No new complaint.  Blood pressure and blood sugar level stable.  Assessment/Plan: COVID pneumonia -Presented with fever, hypoxia, altered mental status -COVID test: PCR positive on admission -Treatment: Completed a course of IV remdesivir on 5/19.  On steroids, to complete the course the next 3 days -WBC and inflammatory markers trend as below. Recent Labs  Lab 07/22/20 0419 07/23/20 0521 07/24/20 0655 07/25/20 0521 07/26/20 0536  WBC 7.6 5.6 6.8 7.2 8.1  DDIMER 1.58* 1.14* 1.01* 0.78* 1.24*  FERRITIN 205 204 210 171 130  CRP 19.4* 14.3* 9.9* 6.9* 4.7*  ALT 83* 89* 109* 111* 109*   Acute on chronic respiratory failure with hypoxia  -Multifactorial : COVID infection, CHF exacerbation.   -Currently on 2 L oxygen by nasal cannula which is her baseline requirement. -Oxygen - SpO2: 97 % O2 Flow Rate  (L/min): 2 L/min   Acute metabolic encephalopathy -Mental status was poor yesterday 5/19.  Unclear etiology.  Mental status better today.    Acute on chronic systolic CHF History of CHF, last echo on 03/09/2020 with EF 45 to 50%.   -Chest x-ray on presentation with small bilateral pleural effusion and diffuse interstitial edema  -BNP elevated to more than 4000.   -She was diuresed with IV Lasix and subsequently switched to oral Lasix.   -She is currently on oral metoprolol, Entresto and oral Lasix.  Continue the same. -Net IO Since Admission: -2,725 mL [07/28/20 1238] -Continue to monitor for daily intake output, weight, blood pressure, BNP, renal function and electrolytes. Recent Labs  Lab 07/22/20 0419 07/23/20 0521 07/24/20 0655 07/24/20 1305 07/25/20 0521 07/26/20 0536 07/28/20 0548  BNP  --   --  >4,500.0*  --   --   --   --   BUN 33* 42* 52* 58* 58* 53*  --   CREATININE 0.99 0.91 1.24* 1.22* 1.25* 0.99 0.92  K 3.3* 4.1 5.3* 4.2 4.8 3.9  --   MG 1.6* 2.0 2.1  --  2.6* 2.2  --    Type 2 diabetes mellitus -A1c 7.5 on 4/28 -Not on any home meds. -While in the hospital, her blood sugar levels were running high because of steroids and hence insulin was used.  Blood sugar level started to drop. No need of insulin at discharge. Recent Labs  Lab 07/27/20 1624 07/27/20 2040 07/28/20 0304 07/28/20 0840 07/28/20 1119  GLUCAP 134* 168*  147* 117* 112*   UTI UA positive.  Urine culture with multiple species.  Completed 5 days of IV Rocephin.  No need to continue antibiotic was discharged.  COPD -Chronically on prednisone, bronchodilators.  Crohn's disease ([place]) Rheumatoid arthritis ([place]) -Patient on chronic immunosuppressive therapyand received her first dose of Humira 1 day prior to admission -Continueleflunomide and mesalamine  Mobility: Encourage ambulation.  PT eval obtained.  Back to SNF Code Status:   Code Status: Full Code  Nutritional status: Body mass  index is 26.95 kg/m.     Diet Order            Diet Carb Modified           Diet - low sodium heart healthy           Diet 2 gram sodium Room service appropriate? Yes; Fluid consistency: Thin  Diet effective now                 DVT prophylaxis: enoxaparin (LOVENOX) injection 40 mg Start: 07/21/20 1600   Antimicrobials:  None currently Fluid: None Consultants: None Family Communication:  Not at bedside  Status is: Inpatient  Remains inpatient appropriate because: Cardiac.  Dispo: The patient is from: SNF              Anticipated d/c is to: SNF when bed available              Patient currently is medically stable to d/c.   Difficult to place patient No     Infusions:  . sodium chloride      Scheduled Meds: . allopurinol  100 mg Oral Daily  . amitriptyline  20 mg Oral QHS  . vitamin C  500 mg Oral Daily  . aspirin EC  81 mg Oral Daily  . colchicine  0.6 mg Oral Daily  . enoxaparin (LOVENOX) injection  40 mg Subcutaneous Q24H  . folic acid  259 mcg Oral Daily  . insulin aspart  0-5 Units Subcutaneous QHS  . insulin aspart  0-9 Units Subcutaneous TID WC  . leflunomide  10 mg Oral Daily  . mesalamine  500 mg Oral BID  . metoprolol succinate  25 mg Oral Daily  . multivitamin with minerals  1 tablet Oral Daily  . pantoprazole  40 mg Oral Daily  . predniSONE  20 mg Oral Daily  . sacubitril-valsartan  1 tablet Oral BID  . sodium chloride flush  3 mL Intravenous Q12H  . thiamine  100 mg Oral Daily  . torsemide  20 mg Oral Daily  . umeclidinium-vilanterol  1 puff Inhalation Daily  . vitamin B-12  500 mcg Oral Daily  . zinc sulfate  220 mg Oral Daily    Antimicrobials: Anti-infectives (From admission, onward)   Start     Dose/Rate Route Frequency Ordered Stop   07/22/20 1000  remdesivir 100 mg in sodium chloride 0.9 % 100 mL IVPB  Status:  Discontinued       "Followed by" Linked Group Details   100 mg 200 mL/hr over 30 Minutes Intravenous Daily 07/21/20 1413  07/21/20 1417   07/22/20 1000  remdesivir 100 mg in sodium chloride 0.9 % 100 mL IVPB       "Followed by" Linked Group Details   100 mg 200 mL/hr over 30 Minutes Intravenous Daily 07/21/20 1416 07/25/20 1106   07/21/20 1600  remdesivir 200 mg in sodium chloride 0.9% 250 mL IVPB  Status:  Discontinued       "Followed  by" Linked Group Details   200 mg 580 mL/hr over 30 Minutes Intravenous Once 07/21/20 1413 07/21/20 1417   07/21/20 1600  cefTRIAXone (ROCEPHIN) 1 g in sodium chloride 0.9 % 100 mL IVPB        1 g 200 mL/hr over 30 Minutes Intravenous Every 24 hours 07/21/20 1508 07/25/20 1600   07/21/20 1430  remdesivir 200 mg in sodium chloride 0.9% 250 mL IVPB       "Followed by" Linked Group Details   200 mg 580 mL/hr over 30 Minutes Intravenous Once 07/21/20 1416 07/21/20 1902   07/21/20 1345  ciprofloxacin (CIPRO) IVPB 400 mg  Status:  Discontinued        400 mg 200 mL/hr over 60 Minutes Intravenous  Once 07/21/20 1344 07/21/20 1508      PRN meds: sodium chloride, acetaminophen, acetaminophen, albuterol, dextromethorphan-guaiFENesin, ondansetron (ZOFRAN) IV, sodium chloride flush, traZODone   Objective: Vitals:   07/28/20 0846 07/28/20 1121  BP: (!) 142/80 131/62  Pulse: (!) 110 78  Resp: 17 18  Temp: 97.6 F (36.4 C) 97.6 F (36.4 C)  SpO2: 95% 96%    Intake/Output Summary (Last 24 hours) at 07/28/2020 1238 Last data filed at 07/28/2020 1015 Gross per 24 hour  Intake 1060 ml  Output 1250 ml  Net -190 ml   Filed Weights   07/26/20 0411 07/27/20 0355 07/28/20 0307  Weight: 75.8 kg 75.8 kg 75.8 kg   Weight change: -0.091 kg Body mass index is 26.95 kg/m.   Physical Exam: General exam: Pleasant elderly Caucasian female.  Not in physical distress Skin: No rashes, lesions or ulcers. HEENT: Atraumatic, normocephalic, no obvious bleeding Lungs: Clear to auscultation bilaterally CVS: Regular rate and rhythm, no murmur GI/Abd soft, nontender, nondistended, bowel sound  present CNS: Alert, awake, sleepy.  Opens eyes to answer simple questions Psychiatry: Depressed look Extremities: Pedal edema 1+ bilaterally  Data Review: I have personally reviewed the laboratory data and studies available.  Recent Labs  Lab 07/22/20 0419 07/23/20 0521 07/24/20 0655 07/25/20 0521 07/26/20 0536  WBC 7.6 5.6 6.8 7.2 8.1  NEUTROABS 6.7 4.4 5.7 5.9 6.6  HGB 13.9 13.3 14.4 14.2 13.5  HCT 43.3 41.6 43.7 44.3 43.0  MCV 89.3 89.5 89.4 87.5 88.8  PLT 109* 125* 138* 153 140*   Recent Labs  Lab 07/22/20 0419 07/23/20 0521 07/24/20 0655 07/24/20 1305 07/25/20 0521 07/26/20 0536 07/28/20 0548  NA 144 141 141 139 141 144  --   K 3.3* 4.1 5.3* 4.2 4.8 3.9  --   CL 106 105 102 103 106 110  --   CO2 27 25 26  20* 22 24  --   GLUCOSE 158* 203* 244* 313* 196* 79  --   BUN 33* 42* 52* 58* 58* 53*  --   CREATININE 0.99 0.91 1.24* 1.22* 1.25* 0.99 0.92  CALCIUM 7.6* 7.3* 7.8* 7.6* 7.7* 7.6*  --   MG 1.6* 2.0 2.1  --  2.6* 2.2  --   PHOS 4.6 4.2 4.9*  --  4.3 3.7  --     F/u labs ordered Unresulted Labs (From admission, onward)          Start     Ordered   07/28/20 0500  Creatinine, serum  (enoxaparin (LOVENOX)    CrCl >/= 30 ml/min)  Weekly,   STAT     Comments: while on enoxaparin therapy    07/21/20 1414   07/21/20 1044  CBC WITH DIFFERENTIAL  (Undifferentiated presentation (screening labs  and basic nursing orders))  ONCE - STAT,   STAT        07/21/20 1043          Signed, Terrilee Croak, MD [facility] 07/28/2020

## 2020-07-29 LAB — GLUCOSE, CAPILLARY
Glucose-Capillary: 168 mg/dL — ABNORMAL HIGH (ref 70–99)
Glucose-Capillary: 114 mg/dL — ABNORMAL HIGH (ref 70–99)
Glucose-Capillary: 205 mg/dL — ABNORMAL HIGH (ref 70–99)

## 2020-07-29 NOTE — TOC Transition Note (Signed)
Transition of Care [facility]) - CM/SW Discharge Note   Patient Details  Name: Raven [name] MRN: [id number] Date of Birth: 10/26/1934  Transition of Care [facility]) CM/SW Contact:  Alberteen Sam, LCSW Phone Number: 07/29/2020, 1:20 PM   Clinical Narrative:     Patient will DC to: [facility] Anticipated DC date: 07/29/20 Transport by: Johnanna Schneiders  Per MD patient ready for DC to [facility] . RN, patient, patient's family, and facility notified of DC. Discharge Summary sent to facility. RN given number for report [phone]. DC packet on chart. Ambulance transport requested for patient.  CSW signing off.  Pricilla Riffle, LCSW    Final next level of care: Skilled Nursing Facility Barriers to Discharge: No Barriers Identified   Patient Goals and CMS Choice Patient states their goals for this hospitalization and ongoing recovery are:: to go back to SNF CMS Medicare.gov Compare Post Acute Care list provided to:: Patient Choice offered to / list presented to : Patient  Discharge Placement                       Discharge Plan and Services                                     Social Determinants of Health (SDOH) Interventions     Readmission Risk Interventions Readmission Risk Prevention Plan 03/11/2020 11/25/2019  Transportation Screening Complete Complete  Medication Review Press photographer) Complete Complete  PCP or Specialist appointment within 3-5 days of discharge Complete Complete  HRI or Home Care Consult Complete Complete  SW Recovery Care/Counseling Consult Complete Complete  Palliative Care Screening Not Applicable Not [place] Complete Not Applicable  Some recent data might be hidden

## 2020-07-29 NOTE — Plan of Care (Signed)
  Problem: Clinical Measurements: Goal: Ability to maintain clinical measurements within normal limits will improve Outcome: Progressing Goal: Diagnostic test results will improve Outcome: Progressing

## 2020-07-29 NOTE — NC FL2 (Signed)
[place] LEVEL OF CARE SCREENING TOOL     IDENTIFICATION  Patient Name: Raven [name] Birthdate: [birth year] Sex: female Admission Date (Current Location): 07/21/2020  [place] and [place] Number:  Engineering geologist and Address:  [facility], [address] [place], [place] [address]      Provider Number: [id number]  Attending Physician Name and Address:  Terrilee Croak, MD  Relative Name and Phone Number:  Harriette Ohara (Daughter) [phone]    Current Level of Care: Hospital Recommended Level of Care: [place] Prior Approval Number:    Date Approved/Denied:   PASRR Number: [id number]  Discharge Plan: SNF    Current Diagnoses: Patient Active Problem List   Diagnosis Date Noted  . COVID-19 virus detected 07/21/2020  . Acute lower UTI 07/21/2020  . SVT (supraventricular tachycardia) ([place] [address])   . Acute gout of left ankle   . Acute pain of left shoulder   . Type 2 diabetes mellitus with hyperlipidemia ([place])   . Acute kidney injury superimposed on CKD ([place]) 07/04/2020  . Dizziness 07/04/2020  . Dyspnea 03/09/2020  . Nausea vomiting and diarrhea 03/08/2020  . Hyperkalemia   . Hypomagnesemia   . Pain in both lower extremities   . Generalized weakness   . Rheumatoid arthritis ([place])   . Acute on chronic systolic CHF (congestive heart failure) ([place]) 11/22/2019  . Acute on chronic systolic (congestive) heart failure ([place]) 11/22/2019  . Chronic respiratory failure with hypoxia ([place]) 11/22/2019  . HLD (hyperlipidemia) 08/25/2019  . HTN (hypertension) 08/25/2019  . Depression with anxiety   . CAD (coronary artery disease)   . Transaminitis   . Hypokalemia   . Crohn's disease ([place])   . Acute on chronic respiratory failure with hypoxia ([place])   . Steroid-dependent COPD ([place])   . SBO (small bowel obstruction) ([place]) 06/17/2019  . Crohn's disease of small intestine with intestinal obstruction ([place])   . COPD with acute  exacerbation ([place]) 10/20/2018  . COPD (chronic obstructive pulmonary disease) ([place]) 04/22/2016  . Acute bronchitis 04/17/2016  . Sepsis ([place]) 04/17/2016  . Peroneal tendonitis 07/18/2015  . Pneumonia 04/19/2015  . CAP (community acquired pneumonia) 11/02/2014    Orientation RESPIRATION BLADDER Height & Weight     Self,Time,Situation,Place  O2 (2L nasal cannula) Continent,External catheter Weight: 178 lb 14.4 oz (81.1 kg) Height:  5' 6"  (167.6 cm)  BEHAVIORAL SYMPTOMS/MOOD NEUROLOGICAL BOWEL NUTRITION STATUS      Continent Diet (see discharge summary)  AMBULATORY STATUS COMMUNICATION OF NEEDS Skin   Limited Assist Verbally Normal                       Personal Care Assistance Level of Assistance  Feeding,Bathing,Dressing,Total care Bathing Assistance: Limited assistance Feeding assistance: Independent Dressing Assistance: Limited assistance Total Care Assistance: Limited assistance   Functional Limitations Info  Sight,Hearing,Speech Sight Info: Impaired Hearing Info: Adequate Speech Info: Adequate    SPECIAL CARE FACTORS FREQUENCY        PT Frequency: min 4x weekly OT Frequency: min 4x weekly            Contractures Contractures Info: Not present    Additional Factors Info  Code Status,Allergies Code Status Info: Full Allergies Info: Penicillins, augmentin, indocin, lodine, methotrexate derivatives, gold, zantac           Current Medications (07/29/2020):  This is the current hospital active medication list Current Facility-Administered Medications  Medication Dose Route Frequency Provider Last Rate Last Admin  .  0.9 %  sodium chloride infusion  250 mL Intravenous PRN Agbata, Tochukwu, MD      . acetaminophen (TYLENOL) tablet 500-1,000 mg  500-1,000 mg Oral Q6H PRN Agbata, Tochukwu, MD      . acetaminophen (TYLENOL) tablet 650 mg  650 mg Oral Q4H PRN Agbata, Tochukwu, MD   650 mg at 07/27/20 0854  . albuterol (VENTOLIN HFA) 108 (90 Base) MCG/ACT inhaler  2 puff  2 puff Inhalation Q6H PRN Agbata, Tochukwu, MD      . allopurinol (ZYLOPRIM) tablet 100 mg  100 mg Oral Daily Agbata, Tochukwu, MD   100 mg at 07/29/20 0934  . amitriptyline (ELAVIL) tablet 20 mg  20 mg Oral QHS Agbata, Tochukwu, MD   20 mg at 07/28/20 2219  . ascorbic acid (VITAMIN C) tablet 500 mg  500 mg Oral Daily Agbata, Tochukwu, MD   500 mg at 07/29/20 0933  . aspirin EC tablet 81 mg  81 mg Oral Daily Agbata, Tochukwu, MD   81 mg at 07/29/20 0934  . colchicine tablet 0.6 mg  0.6 mg Oral Daily Agbata, Tochukwu, MD   0.6 mg at 07/29/20 0934  . dextromethorphan-guaiFENesin (MUCINEX DM) 30-600 MG per 12 hr tablet 1 tablet  1 tablet Oral BID PRN Collier Bullock, MD   1 tablet at 07/28/20 0305  . enoxaparin (LOVENOX) injection 40 mg  40 mg Subcutaneous Q24H Agbata, Tochukwu, MD   40 mg at 07/28/20 1736  . folic acid (FOLVITE) tablet 0.5 mg  500 mcg Oral Daily Agbata, Tochukwu, MD   0.5 mg at 07/29/20 0934  . insulin aspart (novoLOG) injection 0-5 Units  0-5 Units Subcutaneous QHS Terrilee Croak, MD   4 Units at 07/24/20 2153  . insulin aspart (novoLOG) injection 0-9 Units  0-9 Units Subcutaneous TID WC Terrilee Croak, MD   2 Units at 07/28/20 1735  . leflunomide (ARAVA) tablet 10 mg  10 mg Oral Daily Agbata, Tochukwu, MD   10 mg at 07/29/20 0933  . mesalamine (PENTASA) CR capsule 500 mg  500 mg Oral BID Agbata, Tochukwu, MD   500 mg at 07/29/20 0933  . metoprolol succinate (TOPROL-XL) 24 hr tablet 25 mg  25 mg Oral Daily Dahal, Marlowe Aschoff, MD   25 mg at 07/29/20 0933  . multivitamin with minerals tablet 1 tablet  1 tablet Oral Daily Agbata, Tochukwu, MD   1 tablet at 07/29/20 0933  . ondansetron (ZOFRAN) injection 4 mg  4 mg Intravenous Q6H PRN Agbata, Tochukwu, MD      . pantoprazole (PROTONIX) EC tablet 40 mg  40 mg Oral Daily Agbata, Tochukwu, MD   40 mg at 07/29/20 0934  . predniSONE (DELTASONE) tablet 20 mg  20 mg Oral Daily Dahal, Marlowe Aschoff, MD   20 mg at 07/29/20 0933  .  sacubitril-valsartan (ENTRESTO) 24-26 mg per tablet  1 tablet Oral BID Terrilee Croak, MD   1 tablet at 07/29/20 0933  . sodium chloride flush (NS) 0.9 % injection 3 mL  3 mL Intravenous Q12H Agbata, Tochukwu, MD   3 mL at 07/28/20 2220  . sodium chloride flush (NS) 0.9 % injection 3 mL  3 mL Intravenous PRN Agbata, Tochukwu, MD   3 mL at 07/22/20 0445  . thiamine tablet 100 mg  100 mg Oral Daily Agbata, Tochukwu, MD   100 mg at 07/29/20 0933  . torsemide (DEMADEX) tablet 20 mg  20 mg Oral Daily Dahal, Marlowe Aschoff, MD   20 mg at 07/29/20 0933  . traZODone (DESYREL)  tablet 50 mg  50 mg Oral QHS PRN Agbata, Tochukwu, MD   50 mg at 07/24/20 2153  . umeclidinium-vilanterol (ANORO ELLIPTA) 62.5-25 MCG/INH 1 puff  1 puff Inhalation Daily Agbata, Tochukwu, MD   1 puff at 07/29/20 0935  . vitamin B-12 (CYANOCOBALAMIN) tablet 500 mcg  500 mcg Oral Daily Agbata, Tochukwu, MD   500 mcg at 07/29/20 0933  . zinc sulfate capsule 220 mg  220 mg Oral Daily Agbata, Tochukwu, MD   220 mg at 07/29/20 3570     Discharge Medications: Please see discharge summary for a list of discharge medications.  Relevant Imaging Results:  Relevant Lab Results:   Additional Information SSN: [id number]  Alberteen Sam, LCSW

## 2020-07-29 NOTE — Progress Notes (Signed)
Patient is ready for discharge this morning.  Discharge summary in 5/21. Updated. Nonbillable visit today.

## 2020-07-29 NOTE — Progress Notes (Addendum)
Patient alert and oriented to self and place.  D/c telemetry and piv.  escortd out of hospital by ems.  Report called to [facility].

## 2020-07-29 NOTE — Care Management Important Message (Signed)
Important Message  Patient Details  Name: Raven [name] MRN: [id number] Date of Birth: 1934-06-04   Medicare Important Message Given:  Yes  Reviewed Medicare IM with daughter, Elson Areas, at [phone].   Dannette Barbara 07/29/2020, 10:21 AM

## 2020-07-29 NOTE — Progress Notes (Signed)
Physical Therapy Treatment Patient Details Name: Raven [name] MRN: [id number] DOB: 1934/07/06 Today's Date: 07/29/2020    History of Present Illness Raven [name] is an 7yoF who [name] on 5/15 c fever. PMH: CAD, sCHF on enestro and metoprolol, EF 45-50%, steroid dependent COPD on 1-2L at home, dementia, Crohn's disease, RA, had been on Humira for years, recently stopped due to concerns over cadiac affects, has felt sick ever since.[name] Upon arrival, BNP >4500, (+) Covid 5/15.    PT Comments    Patient agreeable to PT and cooperative during session. Patient continues to require assistance with basic mobility. Unable to stand this date despite multiple standing attempts with cues for technique. Patient has generalized weakness throughout and will need continued PT to maximize independence and facilitate return to prior level of function. SNF is still recommended at this time.     Follow Up Recommendations  SNF     Equipment Recommendations  None recommended by PT    Recommendations for Other Services       Precautions / Restrictions Precautions Precautions: Fall Restrictions Weight Bearing Restrictions: No    Mobility  Bed Mobility Overal bed mobility: Needs Assistance Bed Mobility: Sit to Supine     Supine to sit: Mod assist Sit to supine: Mod assist   General bed mobility comments: assistance for trunk support to sit upright. assistance for LE support to return to bed. verbal cues for technique and sequencing    Transfers Overall transfer level: Needs assistance               General transfer comment: attempted multiple sit to stand transfers from the bed. patient unable to stand despite assistance. verbal cues for technique and assistance for foot placement bilaterally. attempted with and without rolling walker. patient fatigued with activity  Ambulation/Gait                 Stairs             Wheelchair Mobility    Modified Rankin  (Stroke Patients Only)       Balance                                            Cognition Arousal/Alertness: Awake/alert Behavior During Therapy: WFL for tasks assessed/performed Overall Cognitive Status: Within Functional Limits for tasks assessed                                        Exercises      General Comments        Pertinent Vitals/Pain Pain Assessment: No/denies pain    Home Living                      Prior Function            PT Goals (current goals can now be found in the care plan section) Acute Rehab PT Goals Patient Stated Goal: to get stronger PT Goal Formulation: With patient Time For Goal Achievement: 09/04/2020 Potential to Achieve Goals: Fair Progress towards PT goals: Progressing toward goals    Frequency    Min 2X/week      PT Plan Current plan remains appropriate    Co-evaluation  AM-PAC PT "6 Clicks" Mobility   Outcome Measure  Help needed turning from your back to your side while in a flat bed without using bedrails?: A Little Help needed moving from lying on your back to sitting on the side of a flat bed without using bedrails?: A Lot Help needed moving to and from a bed to a chair (including a wheelchair)?: Total Help needed standing up from a chair using your arms (e.g., wheelchair or bedside chair)?: Total Help needed to walk in hospital room?: Total Help needed climbing 3-5 steps with a railing? : Total 6 Click Score: 9    End of Session Equipment Utilized During Treatment: Oxygen Activity Tolerance: Patient limited by fatigue Patient left: in bed;with call bell/phone within reach;with bed alarm set   PT Visit Diagnosis: Difficulty in walking, not elsewhere classified (R26.2);Muscle weakness (generalized) (M62.81);Other abnormalities of gait and mobility (R26.89);Unsteadiness on feet (R26.81)     Time: 3403-5248 PT Time Calculation (min) (ACUTE ONLY): 28  min  Charges:  $Therapeutic Activity: 23-37 mins                     Raven [name], PT, MPT    Raven [name] 07/29/2020, 2:34 PM

## 2020-08-07 ENCOUNTER — Inpatient Hospital Stay
Admission: EM | Admit: 2020-08-07 | Discharge: 2020-09-06 | DRG: 208 | Disposition: E | Payer: Medicare Other | Source: Skilled Nursing Facility | Attending: Internal Medicine | Admitting: Internal Medicine

## 2020-08-07 ENCOUNTER — Other Ambulatory Visit: Payer: Self-pay

## 2020-08-07 ENCOUNTER — Inpatient Hospital Stay
Admit: 2020-08-07 | Discharge: 2020-08-07 | Disposition: A | Discharge status: Home / Self Care | Payer: Medicare Other | Attending: Internal Medicine | Admitting: Internal Medicine

## 2020-08-07 ENCOUNTER — Inpatient Hospital Stay: Payer: Medicare Other

## 2020-08-07 ENCOUNTER — Emergency Department: Payer: Medicare Other

## 2020-08-07 ENCOUNTER — Encounter: Payer: Self-pay | Admitting: Internal Medicine

## 2020-08-07 DIAGNOSIS — J440 Chronic obstructive pulmonary disease with acute lower respiratory infection: Secondary | ICD-10-CM | POA: Diagnosis present

## 2020-08-07 DIAGNOSIS — Z8711 Personal history of peptic ulcer disease: Secondary | ICD-10-CM | POA: Diagnosis not present

## 2020-08-07 DIAGNOSIS — R609 Edema, unspecified: Secondary | ICD-10-CM

## 2020-08-07 DIAGNOSIS — I248 Other forms of acute ischemic heart disease: Secondary | ICD-10-CM | POA: Diagnosis present

## 2020-08-07 DIAGNOSIS — Z8616 Personal history of COVID-19: Secondary | ICD-10-CM

## 2020-08-07 DIAGNOSIS — I469 Cardiac arrest, cause unspecified: Secondary | ICD-10-CM | POA: Diagnosis not present

## 2020-08-07 DIAGNOSIS — I5043 Acute on chronic combined systolic (congestive) and diastolic (congestive) heart failure: Secondary | ICD-10-CM | POA: Diagnosis present

## 2020-08-07 DIAGNOSIS — Z9109 Other allergy status, other than to drugs and biological substances: Secondary | ICD-10-CM

## 2020-08-07 DIAGNOSIS — J159 Unspecified bacterial pneumonia: Secondary | ICD-10-CM | POA: Diagnosis present

## 2020-08-07 DIAGNOSIS — M069 Rheumatoid arthritis, unspecified: Secondary | ICD-10-CM | POA: Diagnosis present

## 2020-08-07 DIAGNOSIS — R001 Bradycardia, unspecified: Secondary | ICD-10-CM | POA: Diagnosis not present

## 2020-08-07 DIAGNOSIS — I10 Essential (primary) hypertension: Secondary | ICD-10-CM | POA: Diagnosis present

## 2020-08-07 DIAGNOSIS — Z8249 Family history of ischemic heart disease and other diseases of the circulatory system: Secondary | ICD-10-CM

## 2020-08-07 DIAGNOSIS — N179 Acute kidney failure, unspecified: Secondary | ICD-10-CM | POA: Diagnosis present

## 2020-08-07 DIAGNOSIS — Z7952 Long term (current) use of systemic steroids: Secondary | ICD-10-CM

## 2020-08-07 DIAGNOSIS — K5090 Crohn's disease, unspecified, without complications: Secondary | ICD-10-CM | POA: Diagnosis present

## 2020-08-07 DIAGNOSIS — I472 Ventricular tachycardia: Secondary | ICD-10-CM | POA: Diagnosis present

## 2020-08-07 DIAGNOSIS — F32A Depression, unspecified: Secondary | ICD-10-CM | POA: Diagnosis present

## 2020-08-07 DIAGNOSIS — Z7951 Long term (current) use of inhaled steroids: Secondary | ICD-10-CM

## 2020-08-07 DIAGNOSIS — J9621 Acute and chronic respiratory failure with hypoxia: Secondary | ICD-10-CM | POA: Diagnosis present

## 2020-08-07 DIAGNOSIS — J441 Chronic obstructive pulmonary disease with (acute) exacerbation: Secondary | ICD-10-CM | POA: Diagnosis present

## 2020-08-07 DIAGNOSIS — Z880 Allergy status to penicillin: Secondary | ICD-10-CM

## 2020-08-07 DIAGNOSIS — R748 Abnormal levels of other serum enzymes: Secondary | ICD-10-CM

## 2020-08-07 DIAGNOSIS — Z888 Allergy status to other drugs, medicaments and biological substances status: Secondary | ICD-10-CM

## 2020-08-07 DIAGNOSIS — D649 Anemia, unspecified: Secondary | ICD-10-CM | POA: Diagnosis present

## 2020-08-07 DIAGNOSIS — I2510 Atherosclerotic heart disease of native coronary artery without angina pectoris: Secondary | ICD-10-CM | POA: Diagnosis present

## 2020-08-07 DIAGNOSIS — U071 COVID-19: Secondary | ICD-10-CM

## 2020-08-07 DIAGNOSIS — E1169 Type 2 diabetes mellitus with other specified complication: Secondary | ICD-10-CM | POA: Diagnosis present

## 2020-08-07 DIAGNOSIS — I110 Hypertensive heart disease with heart failure: Secondary | ICD-10-CM | POA: Diagnosis present

## 2020-08-07 DIAGNOSIS — R0602 Shortness of breath: Secondary | ICD-10-CM

## 2020-08-07 DIAGNOSIS — I5022 Chronic systolic (congestive) heart failure: Secondary | ICD-10-CM

## 2020-08-07 DIAGNOSIS — Z7982 Long term (current) use of aspirin: Secondary | ICD-10-CM

## 2020-08-07 DIAGNOSIS — Z955 Presence of coronary angioplasty implant and graft: Secondary | ICD-10-CM

## 2020-08-07 DIAGNOSIS — F0390 Unspecified dementia without behavioral disturbance: Secondary | ICD-10-CM | POA: Diagnosis present

## 2020-08-07 DIAGNOSIS — I4729 Other ventricular tachycardia: Secondary | ICD-10-CM

## 2020-08-07 DIAGNOSIS — M1990 Unspecified osteoarthritis, unspecified site: Secondary | ICD-10-CM | POA: Diagnosis present

## 2020-08-07 DIAGNOSIS — Z9981 Dependence on supplemental oxygen: Secondary | ICD-10-CM | POA: Diagnosis not present

## 2020-08-07 DIAGNOSIS — Z87891 Personal history of nicotine dependence: Secondary | ICD-10-CM

## 2020-08-07 DIAGNOSIS — I252 Old myocardial infarction: Secondary | ICD-10-CM | POA: Diagnosis not present

## 2020-08-07 DIAGNOSIS — J1282 Pneumonia due to coronavirus disease 2019: Secondary | ICD-10-CM

## 2020-08-07 DIAGNOSIS — R404 Transient alteration of awareness: Secondary | ICD-10-CM | POA: Diagnosis not present

## 2020-08-07 DIAGNOSIS — E785 Hyperlipidemia, unspecified: Secondary | ICD-10-CM | POA: Diagnosis present

## 2020-08-07 DIAGNOSIS — R778 Other specified abnormalities of plasma proteins: Secondary | ICD-10-CM

## 2020-08-07 DIAGNOSIS — M8580 Other specified disorders of bone density and structure, unspecified site: Secondary | ICD-10-CM | POA: Diagnosis present

## 2020-08-07 DIAGNOSIS — Z79899 Other long term (current) drug therapy: Secondary | ICD-10-CM

## 2020-08-07 LAB — TROPONIN I (HIGH SENSITIVITY): Troponin I (High Sensitivity): 254 ng/L (ref <18)

## 2020-08-07 LAB — CBC WITH DIFFERENTIAL/PLATELET
Abs Immature Granulocytes: 0 10*3/uL (ref 0.00–0.07)
Band Neutrophils: 2 %
Basophils Absolute: 0 10*3/uL (ref 0.0–0.1)
Basophils Relative: 0 %
Eosinophils Absolute: 0 10*3/uL (ref 0.0–0.5)
Eosinophils Relative: 0 %
HCT: 44 % (ref 36.0–46.0)
Hemoglobin: 13.6 g/dL (ref 12.0–15.0)
Lymphocytes Relative: 5 %
Lymphs Abs: 0.6 10*3/uL — ABNORMAL LOW (ref 0.7–4.0)
MCH: 28.1 pg (ref 26.0–34.0)
MCHC: 30.9 g/dL (ref 30.0–36.0)
MCV: 90.9 fL (ref 80.0–100.0)
Monocytes Absolute: 0.5 10*3/uL (ref 0.1–1.0)
Monocytes Relative: 4 %
Neutro Abs: 10.6 10*3/uL — ABNORMAL HIGH (ref 1.7–7.7)
Neutrophils Relative %: 89 %
Platelets: 120 10*3/uL — ABNORMAL LOW (ref 150–400)
RBC: 4.84 MIL/uL (ref 3.87–5.11)
RDW: 19 % — ABNORMAL HIGH (ref 11.5–15.5)
Smear Review: NORMAL
WBC: 11.6 10*3/uL — ABNORMAL HIGH (ref 4.0–10.5)
nRBC: 0.7 % — ABNORMAL HIGH (ref 0.0–0.2)
nRBC: 1 /100 WBC — ABNORMAL HIGH

## 2020-08-07 LAB — CBG MONITORING, ED
Glucose-Capillary: 155 mg/dL — ABNORMAL HIGH (ref 70–99)
Glucose-Capillary: 173 mg/dL — ABNORMAL HIGH (ref 70–99)
Glucose-Capillary: 182 mg/dL — ABNORMAL HIGH (ref 70–99)

## 2020-08-07 LAB — COMPREHENSIVE METABOLIC PANEL
ALT: 168 U/L — ABNORMAL HIGH (ref 0–44)
AST: 150 U/L — ABNORMAL HIGH (ref 15–41)
Albumin: 2.9 g/dL — ABNORMAL LOW (ref 3.5–5.0)
Alkaline Phosphatase: 500 U/L — ABNORMAL HIGH (ref 38–126)
Anion gap: 16 — ABNORMAL HIGH (ref 5–15)
BUN: 37 mg/dL — ABNORMAL HIGH (ref 8–23)
CO2: 30 mmol/L (ref 22–32)
Calcium: 8 mg/dL — ABNORMAL LOW (ref 8.9–10.3)
Chloride: 99 mmol/L (ref 98–111)
Creatinine, Ser: 1.11 mg/dL — ABNORMAL HIGH (ref 0.44–1.00)
GFR, Estimated: 49 mL/min — ABNORMAL LOW (ref >60)
Glucose, Bld: 100 mg/dL — ABNORMAL HIGH (ref 70–99)
Potassium: 3.2 mmol/L — ABNORMAL LOW (ref 3.5–5.1)
Sodium: 145 mmol/L (ref 135–145)
Total Bilirubin: 2.1 mg/dL — ABNORMAL HIGH (ref 0.3–1.2)
Total Protein: 5.5 g/dL — ABNORMAL LOW (ref 6.5–8.1)

## 2020-08-07 LAB — FIBRINOGEN: Fibrinogen: 426 mg/dL (ref 210–475)

## 2020-08-07 LAB — HEPATITIS B SURFACE ANTIGEN: Hepatitis B Surface Ag: NONREACTIVE

## 2020-08-07 LAB — PROCALCITONIN: Procalcitonin: 0.79 ng/mL

## 2020-08-07 LAB — D-DIMER, QUANTITATIVE: D-Dimer, Quant: 1.92 ug/mL-FEU — ABNORMAL HIGH (ref 0.00–0.50)

## 2020-08-07 LAB — GLUCOSE, CAPILLARY: Glucose-Capillary: 176 mg/dL — ABNORMAL HIGH (ref 70–99)

## 2020-08-07 LAB — BRAIN NATRIURETIC PEPTIDE: B Natriuretic Peptide: >4500 pg/mL — ABNORMAL HIGH (ref 0.0–100.0)

## 2020-08-07 LAB — FERRITIN: Ferritin: 876 ng/mL — ABNORMAL HIGH (ref 11–307)

## 2020-08-07 LAB — C-REACTIVE PROTEIN: CRP: 22.7 mg/dL — ABNORMAL HIGH (ref <1.0)

## 2020-08-07 LAB — ABO/RH: ABO/RH(D): O NEG

## 2020-08-07 LAB — LACTATE DEHYDROGENASE: LDH: 719 U/L — ABNORMAL HIGH (ref 98–192)

## 2020-08-07 LAB — MAGNESIUM: Magnesium: 1.7 mg/dL (ref 1.7–2.4)

## 2020-08-07 MED ORDER — FUROSEMIDE 10 MG/ML IJ SOLN
20.0000 mg | Freq: Once | INTRAMUSCULAR | Status: AC
  Administered 2020-08-07: 20 mg via INTRAVENOUS
  Filled 2020-08-07: qty 4

## 2020-08-07 MED ORDER — IPRATROPIUM-ALBUTEROL 0.5-2.5 (3) MG/3ML IN SOLN
3.0000 mL | Freq: Once | RESPIRATORY_TRACT | Status: AC
Start: 2020-08-07 — End: 2020-08-07
  Administered 2020-08-07: 3 mL via RESPIRATORY_TRACT
  Filled 2020-08-07: qty 3

## 2020-08-07 MED ORDER — PREDNISONE 20 MG PO TABS: 50.0000 mg | ORAL_TABLET | Freq: Every day | ORAL | Status: DC

## 2020-08-07 MED ORDER — METHYLPREDNISOLONE SODIUM SUCC 125 MG IJ SOLR
125.0000 mg | Freq: Once | INTRAMUSCULAR | Status: DC
  Administered 2020-08-07: 125 mg via INTRAVENOUS
  Filled 2020-08-07: qty 2

## 2020-08-07 MED ORDER — POTASSIUM CHLORIDE CRYS ER 20 MEQ PO TBCR
40.0000 meq | EXTENDED_RELEASE_TABLET | Freq: Once | ORAL | Status: AC
  Administered 2020-08-07: 40 meq via ORAL

## 2020-08-07 MED ORDER — SODIUM CHLORIDE 0.9 % IV SOLN
500.0000 mg | INTRAVENOUS | Status: DC
  Administered 2020-08-07 – 2020-08-08 (×2): 500 mg via INTRAVENOUS
  Filled 2020-08-07 (×2): qty 500

## 2020-08-07 MED ORDER — SODIUM CHLORIDE 0.9 % IV SOLN: 200.0000 mg | Freq: Once | INTRAVENOUS | Status: DC

## 2020-08-07 MED ORDER — POTASSIUM CHLORIDE CRYS ER 20 MEQ PO TBCR
40.0000 meq | EXTENDED_RELEASE_TABLET | Freq: Two times a day (BID) | ORAL | Status: DC
  Filled 2020-08-07: qty 2

## 2020-08-07 MED ORDER — IOHEXOL 350 MG/ML SOLN
75.0000 mL | Freq: Once | INTRAVENOUS | Status: AC | PRN
  Administered 2020-08-07: 75 mL via INTRAVENOUS

## 2020-08-07 MED ORDER — IPRATROPIUM-ALBUTEROL 0.5-2.5 (3) MG/3ML IN SOLN
3.0000 mL | Freq: Four times a day (QID) | RESPIRATORY_TRACT | Status: DC
  Administered 2020-08-07: 3 mL via RESPIRATORY_TRACT
  Filled 2020-08-07: qty 3

## 2020-08-07 MED ORDER — ACETAMINOPHEN 325 MG PO TABS
650.0000 mg | ORAL_TABLET | Freq: Four times a day (QID) | ORAL | Status: DC | PRN
  Administered 2020-08-07: 650 mg via ORAL
  Filled 2020-08-07: qty 2

## 2020-08-07 MED ORDER — ONDANSETRON HCL 4 MG/2ML IJ SOLN: 4.0000 mg | Freq: Four times a day (QID) | INTRAMUSCULAR | Status: DC | PRN

## 2020-08-07 MED ORDER — POTASSIUM CHLORIDE CRYS ER 20 MEQ PO TBCR
40.0000 meq | EXTENDED_RELEASE_TABLET | Freq: Two times a day (BID) | ORAL | Status: AC
  Administered 2020-08-07 (×2): 40 meq via ORAL
  Filled 2020-08-07 (×2): qty 2

## 2020-08-07 MED ORDER — IPRATROPIUM-ALBUTEROL 0.5-2.5 (3) MG/3ML IN SOLN: 3.0000 mL | RESPIRATORY_TRACT | Status: DC | PRN

## 2020-08-07 MED ORDER — SODIUM CHLORIDE 0.9 % IV SOLN
2.0000 g | INTRAVENOUS | Status: DC
  Administered 2020-08-07 – 2020-08-08 (×2): 2 g via INTRAVENOUS
  Filled 2020-08-07: qty 20
  Filled 2020-08-07: qty 2

## 2020-08-07 MED ORDER — MAGNESIUM SULFATE 2 GM/50ML IV SOLN
2.0000 g | Freq: Once | INTRAVENOUS | Status: AC
  Administered 2020-08-07: 2 g via INTRAVENOUS
  Filled 2020-08-07: qty 50

## 2020-08-07 MED ORDER — ENOXAPARIN SODIUM 40 MG/0.4ML IJ SOSY
40.0000 mg | PREFILLED_SYRINGE | INTRAMUSCULAR | Status: DC
  Administered 2020-08-07: 40 mg via SUBCUTANEOUS
  Filled 2020-08-07: qty 0.4

## 2020-08-07 MED ORDER — TRAZODONE HCL 50 MG PO TABS
50.0000 mg | ORAL_TABLET | Freq: Every evening | ORAL | Status: DC | PRN
  Administered 2020-08-07: 50 mg via ORAL
  Filled 2020-08-07: qty 1

## 2020-08-07 MED ORDER — BIOTENE DRY MOUTH MT LIQD
15.0000 mL | OROMUCOSAL | Status: DC | PRN
  Filled 2020-08-07: qty 15

## 2020-08-07 MED ORDER — PREDNISONE 20 MG PO TABS
20.0000 mg | ORAL_TABLET | Freq: Every day | ORAL | Status: DC
  Administered 2020-08-07: 20 mg via ORAL
  Filled 2020-08-07: qty 1

## 2020-08-07 MED ORDER — FUROSEMIDE 10 MG/ML IJ SOLN
20.0000 mg | Freq: Two times a day (BID) | INTRAMUSCULAR | Status: DC
  Administered 2020-08-07: 20 mg via INTRAVENOUS
  Filled 2020-08-07: qty 4

## 2020-08-07 MED ORDER — SODIUM CHLORIDE 0.9 % IV SOLN: 100.0000 mg | Freq: Every day | INTRAVENOUS | Status: DC

## 2020-08-07 MED ORDER — AMIODARONE IV BOLUS ONLY 150 MG/100ML
150.0000 mg | Freq: Once | INTRAVENOUS | Status: AC
  Administered 2020-08-07: 150 mg via INTRAVENOUS
  Filled 2020-08-07: qty 100

## 2020-08-07 MED ORDER — ACETAMINOPHEN 650 MG RE SUPP: 650.0000 mg | Freq: Four times a day (QID) | RECTAL | Status: DC | PRN

## 2020-08-07 MED ORDER — ONDANSETRON HCL 4 MG PO TABS: 4.0000 mg | ORAL_TABLET | Freq: Four times a day (QID) | ORAL | Status: DC | PRN

## 2020-08-07 MED ORDER — METOPROLOL SUCCINATE ER 50 MG PO TB24
75.0000 mg | ORAL_TABLET | Freq: Every day | ORAL | Status: DC
  Administered 2020-08-07: 75 mg via ORAL
  Filled 2020-08-07: qty 2

## 2020-08-07 MED ORDER — TECHNETIUM TO 99M ALBUMIN AGGREGATED
4.0000 | Freq: Once | INTRAVENOUS | Status: AC | PRN
  Administered 2020-08-07: 4.02 via INTRAVENOUS

## 2020-08-07 MED ORDER — INSULIN ASPART 100 UNIT/ML IJ SOLN
0.0000 [IU] | Freq: Three times a day (TID) | INTRAMUSCULAR | Status: DC
  Administered 2020-08-07 (×2): 3 [IU] via SUBCUTANEOUS
  Filled 2020-08-07 (×2): qty 1

## 2020-08-07 MED ORDER — INSULIN ASPART 100 UNIT/ML IJ SOLN: 0.0000 [IU] | Freq: Every day | INTRAMUSCULAR | Status: DC

## 2020-08-07 MED ORDER — FUROSEMIDE 10 MG/ML IJ SOLN
40.0000 mg | Freq: Two times a day (BID) | INTRAMUSCULAR | Status: DC
  Administered 2020-08-07: 40 mg via INTRAVENOUS
  Filled 2020-08-07: qty 4

## 2020-08-07 MED ORDER — METHYLPREDNISOLONE SODIUM SUCC 125 MG IJ SOLR
125.0000 mg | Freq: Once | INTRAMUSCULAR | Status: AC
  Administered 2020-08-07: 125 mg via INTRAVENOUS
  Filled 2020-08-07: qty 2

## 2020-08-07 MED ORDER — METHYLPREDNISOLONE SODIUM SUCC 125 MG IJ SOLR
1.0000 mg/kg | Freq: Two times a day (BID) | INTRAMUSCULAR | Status: DC
  Administered 2020-08-07: 80 mg via INTRAVENOUS

## 2020-08-07 NOTE — ED Notes (Signed)
Pt able to urinate on own

## 2020-08-07 NOTE — Progress Notes (Signed)
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 Evaluation in the ED patient was found to have nonsustained run of V. tach she received IV amiodarone.  D-dimer 1.9, chest x-ray showed mild cardiomegaly, probably trace effusion patchy peripheral groundglass opacities with bilateral [place] question atypical versus viral pneumonia.   Assessment & Plan:   Principal Problem:   Bacterial pneumonia Active Problems:   COPD with acute exacerbation (HCC)   Acute on chronic respiratory failure with hypoxia (HCC)   CAD (coronary artery disease)   Elevated troponin   HTN (hypertension)   Crohn's disease (HCC)   Chronic systolic CHF (congestive heart failure) (HCC)   Rheumatoid arthritis (HCC)   Type 2 diabetes mellitus with hyperlipidemia (HCC)   Non-sustained ventricular tachycardia (HCC)   Abnormal liver enzymes  1-acute on chronic respiratory failure with hypoxia: Secondary to CHF exacerbation versus COPD exacerbation less likely COVID worsening infection.  Patient was diagnosed with COVID on 5/15. Continue with IV Lasix, nebulizers and prednisone. Continue with IV antibiotics. Elevated d dimer. VQ scan inconclusive. Plan to proceed with CT angion. Doppler negative for DVT   2-suspect bacterial pneumonia and setting of recent COVID: Continue with IV antibiotics  3-nonsustained V. tach: Received IV amiodarone, on metoprolol.  Cardiology  consulted 4-elevated troponin, history of CAD: Cardiology  consulted.  Plan for echo. Continue with aspirin, Toprol and atorvastatin Acute on chronic systolic heart failure exacerbation: Presented with elevation of BNP 4000, chest x-ray with cardiomegaly effusion. Continue with IV Lasix. Cardiology consulted.  COPD exacerbation: Continue with nebulizer Rheumatoid arthritis Crohn's disease: On Arava and Humira outpatient DM; SSI.    Estimated body mass index is 34.44 kg/m as calculated from the following:   Height as of this encounter: 5' (1.524 m).   Weight as of this encounter: 80 kg.   DVT prophylaxis: Lovenox Code Status: Full code Family Communication: Daughter over phone, sister at bedside.  Disposition Plan:  Status is: Inpatient  Remains inpatient appropriate because:IV treatments appropriate due to intensity of illness or inability to take PO   Dispo: The patient is from: SNF              Anticipated d/c is to: SNF              Patient currently is not medically stable to d/c.   Difficult to place patient; none        Consultants:   Cardiology   Procedures:   VQ scan; inconclusive.  Antimicrobials:    Subjective: She denies chest pain. Dyspnea improved.    Objective: Vitals:   08/25/2020 0845 08/30/2020 0900 08/21/2020 1100 08/15/2020 1400  BP: 126/60 (!) 142/97 (!) 152/73 123/75  Pulse: 77 (!) 117 85 66  Resp: 14 19 20  (!) 22  Temp:      TempSrc:      SpO2: 91% 90% 93% 100%  Weight:      Height:        Intake/Output Summary (Last 24 hours) at 08/13/2020 1623 Last data filed at  08/16/2020 1506 Gross per 24 hour  Intake 529.47 ml  Output 900 ml  Net -370.53 ml   Filed Weights   09/03/2020 0105  Weight: 80 kg    Examination:  General exam: Appears calm and comfortable  Respiratory system: BL crackles, wheezing Cardiovascular system: S1 & S2 heard, tachycardia. . Gastrointestinal system: Abdomen is nondistended, soft and nontender. No  organomegaly or masses felt. Normal bowel sounds heard. Central nervous system: Alert and oriented. No focal neurological deficits. Extremities: Symmetric 5 x 5 power. Skin: No rashes, lesions or ulcers Psychiatry: Judgement and insight appear normal. Mood & affect appropriate.     Data Reviewed: I have personally reviewed following labs and imaging studies  CBC: Recent Labs  Lab 08/14/2020 0134  WBC 11.6*  NEUTROABS 10.6*  HGB 13.6  HCT 44.0  MCV 90.9  PLT 357*   Basic Metabolic Panel: Recent Labs  Lab 08/11/2020 0134 08/31/2020 0136  NA 145  --   K 3.2*  --   CL 99  --   CO2 30  --   GLUCOSE 100*  --   BUN 37*  --   CREATININE 1.11*  --   CALCIUM 8.0*  --   MG  --  1.7   GFR: Estimated Creatinine Clearance: 35.3 mL/min (A) (by C-G formula based on SCr of 1.11 mg/dL (H)). Liver Function Tests: Recent Labs  Lab 08/23/2020 0134  AST 150*  ALT 168*  ALKPHOS 500*  BILITOT 2.1*  PROT 5.5*  ALBUMIN 2.9*   No results for input(s): LIPASE, AMYLASE in the last 168 hours. No results for input(s): AMMONIA in the last 168 hours. Coagulation Profile: No results for input(s): INR, PROTIME in the last 168 hours. Cardiac Enzymes: No results for input(s): CKTOTAL, CKMB, CKMBINDEX, TROPONINI in the last 168 hours. BNP (last 3 results) No results for input(s): PROBNP in the last 8760 hours. HbA1C: No results for input(s): HGBA1C in the last 72 hours. CBG: Recent Labs  Lab 08/17/2020 0725 08/16/2020 1107 08/18/2020 1603  GLUCAP 155* 173* 182*   Lipid Profile: No results for input(s): CHOL, HDL, LDLCALC, TRIG, CHOLHDL, LDLDIRECT in the last 72 hours. Thyroid Function Tests: No results for input(s): TSH, T4TOTAL, FREET4, T3FREE, THYROIDAB in the last 72 hours. Anemia Panel: Recent Labs    09/02/2020 0136  FERRITIN 876*   Sepsis Labs: Recent Labs  Lab 08/10/2020 0136  PROCALCITON 0.79    No results found for this or any previous visit (from the past 240 hour(s)).        Radiology Studies: NM Pulmonary Perfusion  Result Date: 09/04/2020 CLINICAL DATA:  Recent coronavirus infection.  Shortness of breath. EXAM: NUCLEAR MEDICINE PERFUSION LUNG SCAN TECHNIQUE: Perfusion images were obtained in multiple projections after intravenous injection of radiopharmaceutical. Ventilation scans intentionally deferred if perfusion scan and chest x-ray adequate for interpretation during COVID 19 epidemic. RADIOPHARMACEUTICALS:  4.02 mCi Tc-64mMAA IV COMPARISON:  Chest radiography same day. Previous perfusion study 07/09/2020 FINDINGS: Today's study suffers from severe clumping of the MAA. Because of this, the study is nondiagnostic. Consider CT angiography with contrast if concern persists. IMPRESSION: Nondiagnostic study because of severe clumping of the MAA. Consider CT angiography with contrast if concern persists. Electronically Signed   By: MNelson ChimesM.D.   On: 08/21/2020 14:44   UK[place]Venous Img Lower Bilateral (DVT)  Result Date: 08/17/2020 CLINICAL DATA:  COPD.  Respiratory distress.  Leg weakness. EXAM: Bilateral LOWER EXTREMITY VENOUS DOPPLER ULTRASOUND TECHNIQUE: Gray-scale sonography with compression, as  well as color and duplex ultrasound, were performed to evaluate the deep venous system(s) from the level of the common femoral vein through the popliteal and proximal calf veins. COMPARISON:  07/09/2020 FINDINGS: VENOUS Normal compressibility of the common femoral, superficial femoral, and popliteal veins, as well as the visualized calf veins. Visualized portions of profunda femoral vein and great saphenous vein unremarkable. No filling defects to suggest DVT on grayscale or color Doppler imaging. Doppler waveforms show normal direction of venous flow, normal respiratory plasticity and response to augmentation. Limited views of the contralateral common femoral vein are unremarkable. OTHER Baker cyst again demonstrated in the left popliteal fossa. Limitations: none  IMPRESSION: Negative for deep venous thrombosis in either lower extremity. Left Baker cyst as seen previously. Electronically Signed   By: Nelson Chimes M.D.   On: 08/18/2020 10:50   DG Chest Port 1 View  Result Date: 08/28/2020 CLINICAL DATA:  Shortness of breath EXAM: PORTABLE CHEST 1 VIEW COMPARISON:  07/21/2020, CT 07/09/2020, chest x-ray 08/25/2019 FINDINGS: Chronic elevation of the right diaphragm. Probable small bilateral effusions. Mild cardiomegaly with aortic atherosclerosis. Patchy somewhat peripheral ground-glass opacities within the bilateral lungs. IMPRESSION: 1. Interim patchy peripheral ground-glass opacities within the bilateral lungs, question atypical or viral pneumonia. 2. Mild cardiomegaly with probable trace effusions. Underlying emphysematous disease Electronically Signed   By: Donavan Foil M.D.   On: 08/17/2020 01:16        Scheduled Meds: . enoxaparin (LOVENOX) injection  40 mg Subcutaneous Q24H  . furosemide  40 mg Intravenous Q12H  . insulin aspart  0-15 Units Subcutaneous TID WC  . insulin aspart  0-5 Units Subcutaneous QHS  . metoprolol succinate  75 mg Oral Q breakfast  . potassium chloride  40 mEq Oral BID  . predniSONE  20 mg Oral Q breakfast   Continuous Infusions: . azithromycin Stopped (08/10/2020 0656)  . cefTRIAXone (ROCEPHIN)  IV Stopped (08/13/2020 0617)     LOS: 0 days    Time spent: 35 minutes.     Elmarie Shiley, MD [facility]   If 7PM-7AM, please contact night-coverage [email or web address]  08/17/2020, 4:23 PM

## 2020-08-07 NOTE — Progress Notes (Signed)
*  PRELIMINARY RESULTS* Echocardiogram 2D Echocardiogram has been performed.  Raven [name] 09/03/2020, 3:11 PM

## 2020-08-07 NOTE — ED Notes (Signed)
RN aware of bed assignment

## 2020-08-07 NOTE — ED Provider Notes (Signed)
[facility] Emergency Department Provider Note   ____________________________________________   Event Date/Time   First MD Initiated Contact with Patient 08/13/2020 [phone]     (approximate)  I have reviewed the triage vital signs and the nursing notes.   HISTORY  Chief Complaint Shortness of Breath    HPI Raven [name] is a 86 y.o. female brought to the ED via EMS from SNF with a chief complaint of respiratory distress.  Patient has a history of COPD on 3 L continuous oxygen.  She tested positive for COVID-19 07/21/2020.  Nursing home reports hypoxia in the 60 percentile; EMS reports patient was in the upper 80% on her home oxygen.  Patient denies fever, chest pain, abdominal pain, nausea, vomiting or diarrhea.     Past Medical History:  Diagnosis Date  . Anemia   . Arthritis   . Asthma   . CHF (congestive heart failure) ([place])   . Complication of anesthesia   . COPD (chronic obstructive pulmonary disease) ([place])   . Crohn's disease ([place])   . Depression    after death of husband  . Hyperlipidemia   . Hypertension   . Myocardial infarction ([place])   . Osteopenia   . Peptic ulcer disease   . Pneumonia   . PONV (postoperative nausea and vomiting)     Patient Active Problem List   Diagnosis Date Noted  . Non-sustained ventricular tachycardia ([place]) 09/05/2020  . Abnormal liver enzymes 08/25/2020  . Bacterial pneumonia 08/18/2020  . COVID-19 virus detected 07/21/2020  . Acute lower UTI 07/21/2020  . SVT (supraventricular tachycardia) ([place])   . Acute gout of left ankle   . Acute pain of left shoulder   . Type 2 diabetes mellitus with hyperlipidemia ([place])   . Acute kidney injury superimposed on CKD ([place]) 07/04/2020  . Dizziness 07/04/2020  . Dyspnea 03/09/2020  . Nausea vomiting and diarrhea 03/08/2020  . Hyperkalemia   . Hypomagnesemia   . Pain in both lower extremities   . Generalized weakness   . Rheumatoid arthritis ([place])   . Acute on  chronic systolic CHF (congestive heart failure) ([place]) 11/22/2019  . Chronic systolic CHF (congestive heart failure) ([place]) 11/22/2019  . Chronic respiratory failure with hypoxia ([place]) 11/22/2019  . HLD (hyperlipidemia) 08/25/2019  . HTN (hypertension) 08/25/2019  . Depression with anxiety   . CAD (coronary artery disease)   . Elevated troponin   . Hypokalemia   . Crohn's disease ([place])   . Acute on chronic respiratory failure with hypoxia ([place])   . 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 SBO (small bowel obstruction) ([place]) 06/17/2019  . Crohn's disease of small intestine with intestinal obstruction ([place])   . COPD with acute exacerbation ([place]) 10/20/2018  . COPD (chronic obstructive pulmonary disease) ([place]) 04/22/2016  . Acute bronchitis 04/17/2016  . Sepsis ([place]) 04/17/2016  . Peroneal tendonitis 07/18/2015  . Pneumonia 04/19/2015  . CAP (community acquired pneumonia) 11/02/2014    Past Surgical History:  Procedure Laterality Date  . ABDOMINAL SURGERY    . CARDIAC CATHETERIZATION     with stent  . COLONOSCOPY WITH PROPOFOL N/A 09/27/2014   Procedure: COLONOSCOPY WITH PROPOFOL;  Surgeon: [name], MD;  Location: [facility] ENDOSCOPY;  Service: Gastroenterology;  Laterality: N/A;  . COLONOSCOPY WITH PROPOFOL N/A 06/21/2019   Procedure: COLONOSCOPY WITH PROPOFOL;  Surgeon: Lin Landsman, MD;  Location: [facility] ENDOSCOPY;  Service: Gastroenterology;  Laterality: N/A;  . EYE SURGERY     bilateral cataract surgeries  .  SHOULDER SURGERY Right    x 2   . SMALL INTESTINE SURGERY      Prior to Admission medications   Medication Sig Start Date End Date Taking? Authorizing Provider  acetaminophen (TYLENOL) 500 MG tablet Take 500-1,000 mg by mouth every 6 (six) hours as needed for mild pain, moderate pain or fever.   Yes [provider]  Adalimumab 40 MG/0.8ML PSKT Inject 40 mg into the skin every 30 (thirty) days.    Yes [provider]  albuterol (PROVENTIL HFA;VENTOLIN HFA) 108  (90 BASE) MCG/ACT inhaler Inhale 2 puffs into the lungs every 6 (six) hours as needed for wheezing or shortness of breath.   Yes [provider]  allopurinol (ZYLOPRIM) 100 MG tablet Take 1 tablet (100 mg total) by mouth daily. 07/09/20 07/09/21 Yes Wieting, Richard, MD  amitriptyline (ELAVIL) 10 MG tablet Take 20 mg by mouth at bedtime.   Yes [provider]  ANORO ELLIPTA 62.5-25 MCG/INH AEPB Inhale 1 puff into the lungs daily. 05/29/19  Yes [provider]  aspirin EC 81 MG tablet Take 81 mg by mouth daily.   Yes [provider]  atorvastatin (LIPITOR) 80 MG tablet Take 80 mg by mouth daily.   Yes [provider]  Calcium-Phosphorus-Vitamin D (CITRACAL +D3 PO) Take 1 tablet by mouth daily.   Yes [provider]  chlorpheniramine-HYDROcodone (TUSSIONEX) 10-8 MG/5ML SUER Take 5 mLs by mouth every 12 (twelve) hours as needed (cough unrelieved with robitussen). 07/09/20  Yes Wieting, Richard, MD  Cobalamin Combinations (VITAMIN B12-FOLIC ACID) 166-063 MCG TABS Take 1 tablet by mouth as directed.   Yes [provider]  colchicine 0.6 MG tablet Take 1 tablet (0.6 mg total) by mouth daily. 07/10/20  Yes Wieting, Richard, MD  dextromethorphan-guaiFENesin [facility] DM) 30-600 MG 12hr tablet Take 1 tablet by mouth 2 (two) times daily as needed for cough. 03/11/20  Yes Nicole Kindred A, DO  ENTRESTO 24-26 MG TAKE ONE TABLET BY MOUTH TWICE DAILY Patient taking differently: Take 1 tablet by mouth 2 (two) times daily. 02/29/20  Yes Hackney, Otila Kluver A, FNP  esomeprazole (NEXIUM) 40 MG capsule Take 40 mg by mouth daily. 05/29/19  Yes [provider]  folic acid (FOLVITE) 016 MCG tablet Take 400 mcg by mouth daily.   Yes [provider]  furosemide (LASIX) 40 MG tablet Take 40 mg by mouth daily.   Yes [provider]  guaiFENesin (ROBITUSSIN) 100 MG/5ML SOLN Take 5 mLs by mouth every 6 (six) hours as needed for cough or to loosen phlegm.    Yes [provider]  ipratropium-albuterol (DUONEB) 0.5-2.5 (3) MG/3ML SOLN Take 3 mLs by nebulization 4 (four) times daily. 08/06/20 08/12/20 Yes [provider]  leflunomide (ARAVA) 10 MG tablet Take 10 mg by mouth daily. 03/29/19  Yes [provider]  mesalamine (PENTASA) 500 MG CR capsule Take 500 mg by mouth 2 (two) times daily.   Yes [provider]  metoprolol succinate (TOPROL-XL) 25 MG 24 hr tablet Take 3 tablets (75 mg total) by mouth daily. Take with or immediately following a meal. 07/10/20  Yes Wieting, Richard, MD  Multiple Vitamin (MULTIVITAMIN WITH MINERALS) TABS tablet Take 1 tablet by mouth daily.   Yes [provider]  ondansetron (ZOFRAN-ODT) 4 MG disintegrating tablet Take 4 mg by mouth every 12 (twelve) hours as needed for nausea/vomiting. 07/03/20  Yes [provider]  potassium chloride (KLOR-CON) 10 MEQ tablet Take 10 mEq by mouth daily.  Yes [provider]  predniSONE (DELTASONE) 20 MG tablet Take 20 mg by mouth daily with breakfast. 07/30/20 08/09/2020 Yes [provider]  thiamine (VITAMIN B-1) 100 MG tablet Take 100 mg by mouth daily.   Yes [provider]  traZODone (DESYREL) 50 MG tablet Take 1 tablet (50 mg total) by mouth at bedtime as needed for sleep. 07/09/20  Yes Wieting, Richard, MD  vitamin E 400 UNIT capsule Take 400 Units by mouth daily.   Yes [provider]  torsemide (DEMADEX) 20 MG tablet Take 1 tablet (20 mg total) by mouth 2 (two) times daily. Patient not taking: Reported on 08/16/2020 07/09/20   Loletha Grayer, MD    Allergies Penicillins, Augmentin [amoxicillin-pot clavulanate], Indocin [indomethacin], Lodine [etodolac], Methotrexate derivatives, Gold, and Zantac [ranitidine hcl]  Family History  Problem Relation Age of Onset  . Hypertension Other     Social History Social History   Tobacco Use  . Smoking status: Former Smoker    Quit date: 1974    Years since  quitting: 48.4  . Smokeless tobacco: Never Used  Vaping Use  . Vaping Use: Never used  Substance Use Topics  . Alcohol use: Yes    Comment: occassional  . Drug use: No    Review of Systems  Constitutional: No fever/chills Eyes: No visual changes. ENT: No sore throat. Cardiovascular: Denies chest pain. Respiratory: Positive for shortness of breath. Gastrointestinal: No abdominal pain.  No nausea, no vomiting.  No diarrhea.  No constipation. Genitourinary: Negative for dysuria. Musculoskeletal: Negative for back pain. Skin: Negative for rash. Neurological: Negative for headaches, focal weakness or numbness.   ____________________________________________   PHYSICAL EXAM:  VITAL SIGNS: ED Triage Vitals  Enc Vitals Group     BP      Pulse      Resp      Temp      Temp src      SpO2      Weight      Height      Head Circumference      Peak Flow      Pain Score      Pain Loc      Pain Edu?      Excl. in [place]?     Constitutional: Alert and oriented.  Elderly appearing and in moderate acute distress. Eyes: Conjunctivae are normal. PERRL. EOMI. Head: Atraumatic. Nose: No congestion/rhinnorhea. Mouth/Throat: Mucous membranes are mildly dry. Neck: No stridor.   Cardiovascular: Tachycardic rate, regular rhythm. Grossly normal heart sounds.  Good peripheral circulation. Respiratory: Increased respiratory effort.  No retractions. Lungs diminished with scattered rhonchi. Gastrointestinal: Soft and nontender. No distention. No abdominal bruits. No CVA tenderness. Musculoskeletal: No lower extremity tenderness nor edema.  No joint effusions. Neurologic:  Normal speech and language. No gross focal neurologic deficits are appreciated.  Skin:  Skin is warm, dry and intact. No rash noted. Psychiatric: Mood and affect are normal. Speech and behavior are normal.  ____________________________________________   LABS (all labs ordered are listed, but only abnormal results are  displayed)  Labs Reviewed  CBC WITH DIFFERENTIAL/PLATELET - Abnormal; Notable for the following components:      Result Value   WBC 11.6 (*)    RDW 19.0 (*)    Platelets 120 (*)    nRBC 0.7 (*)    Neutro Abs 10.6 (*)    Lymphs Abs 0.6 (*)    nRBC 1 (*)    All other components within normal limits  COMPREHENSIVE METABOLIC PANEL - Abnormal; Notable for the following components:   Potassium 3.2 (*)    Glucose, Bld 100 (*)    BUN 37 (*)    Creatinine, Ser 1.11 (*)    Calcium 8.0 (*)    Total Protein 5.5 (*)    Albumin 2.9 (*)    AST 150 (*)    ALT 168 (*)    Alkaline Phosphatase 500 (*)    Total Bilirubin 2.1 (*)    GFR, Estimated 49 (*)    Anion gap 16 (*)    All other components within normal limits  BRAIN NATRIURETIC PEPTIDE - Abnormal; Notable for the following components:   B Natriuretic Peptide >4,500.0 (*)    All other components within normal limits  D-DIMER, QUANTITATIVE - Abnormal; Notable for the following components:   D-Dimer, Quant 1.92 (*)    All other components within normal limits  FERRITIN - Abnormal; Notable for the following components:   Ferritin 876 (*)    All other components within normal limits  LACTATE DEHYDROGENASE - Abnormal; Notable for the following components:   LDH 719 (*)    All other components within normal limits  TROPONIN I (HIGH SENSITIVITY) - Abnormal; Notable for the following components:   Troponin I (High Sensitivity) 254 (*)    All other components within normal limits  MRSA PCR SCREENING  FIBRINOGEN  PROCALCITONIN  MAGNESIUM  C-REACTIVE PROTEIN  HEPATITIS B SURFACE ANTIGEN  ABO/RH  TYPE AND SCREEN  TROPONIN I (HIGH SENSITIVITY)   ____________________________________________  EKG  ED ECG REPORT I, Jessyka Austria J, the attending physician, personally viewed and interpreted this ECG.   Date: 08/26/2020  EKG Time: 0059  Rate: 121  Rhythm: sinus tachycardia  Axis: LAD  Intervals:nonspecific intraventricular conduction  delay  ST&T Change: Nonspecific  ____________________________________________  RADIOLOGY Cecilie Kicks J, personally viewed and evaluated these images (plain radiographs) as part of my medical decision making, as well as reviewing the written report by the radiologist.  ED MD interpretation:  Patchy infiltrates  Official radiology report(s): DG Chest Port 1 View  Result Date: 09/04/2020 CLINICAL DATA:  Shortness of breath EXAM: PORTABLE CHEST 1 VIEW COMPARISON:  07/21/2020, CT 07/09/2020, chest x-ray 08/25/2019 FINDINGS: Chronic elevation of the right diaphragm. Probable small bilateral effusions. Mild cardiomegaly with aortic atherosclerosis. Patchy somewhat peripheral ground-glass opacities within the bilateral lungs. IMPRESSION: 1. Interim patchy peripheral ground-glass opacities within the bilateral lungs, question atypical or viral pneumonia. 2. Mild cardiomegaly with probable trace effusions. Underlying emphysematous disease Electronically Signed   By: Donavan Foil M.D.   On: 09/03/2020 01:16    ____________________________________________   PROCEDURES  Procedure(s) performed (including Critical Care):  .1-3 Lead EKG Interpretation Performed by: Paulette Blanch, MD Authorized by: Paulette Blanch, MD     Interpretation: abnormal     ECG rate:  116   ECG rate assessment: tachycardic     Rhythm: sinus tachycardia     Ectopy: none     Conduction: normal   Comments:     Patient placed on cardiac monitor to evaluate for arrhythmias    CRITICAL CARE Performed by: Paulette Blanch   Total critical care time: 45 minutes  Critical care time was exclusive of separately billable procedures and treating other patients.  Critical care was necessary to treat or prevent imminent or life-threatening deterioration.  Critical care was time spent personally by me on the following activities: development of treatment plan with patient and/or surrogate as well as nursing, discussions  with  consultants, evaluation of patient's response to treatment, examination of patient, obtaining history from patient or surrogate, ordering and performing treatments and interventions, ordering and review of laboratory studies, ordering and review of radiographic studies, pulse oximetry and re-evaluation of patient's condition.  ____________________________________________   INITIAL IMPRESSION / ASSESSMENT AND PLAN / ED COURSE  As part of my medical decision making, I reviewed the following data within the [place] notes reviewed and incorporated, Labs reviewed, EKG interpreted, Old chart reviewed, Radiograph reviewed, Discussed with admitting physician and Notes from prior ED visits     86 year old female presenting in respiratory distress. Differential includes, but is not limited to, viral syndrome, bronchitis including COPD exacerbation, pneumonia, reactive airway disease including asthma, CHF including exacerbation with or without pulmonary/interstitial edema, pneumothorax, ACS, thoracic trauma, and pulmonary embolism.  Will obtain cardiac panel, chest x-ray.  Initiate IV Solu-Medrol and DuoNeb.  I personally reviewed patient's chart and see that she was hospitalized 07/21/2020 to 07/29/2020 for COVID-pneumonia status post Remdesivir.  Clinical Course as of 09/04/2020 0601  Wed Aug 07, 2020  0151 Nonsustained run of v tach noted. Will administer IV bolus Amiodarone. [JS]  0201 Laboratory results noted.  Looks like patient has had elevated D-dimer for the past couple of weeks.  No elevation in troponin most likely demand ischemia.  Note trending up of LFTs continuing from prior.  Will discuss with hospitalist services for admission [JS]    Clinical Course User Index [JS] Paulette Blanch, MD     ____________________________________________   FINAL CLINICAL IMPRESSION(S) / ED DIAGNOSES  Final diagnoses:  Shortness of breath  COPD exacerbation ([place])  Pneumonia due  to COVID-19 virus  Nonsustained ventricular tachycardia ([place])  AKI (acute kidney injury) [facility])     ED Discharge Orders    None       Note:  This document was prepared using Dragon voice recognition software and may include unintentional dictation errors.   Paulette Blanch, MD 08/27/2020 [phone]

## 2020-08-07 NOTE — ED Notes (Signed)
MD at bedside. 

## 2020-08-07 NOTE — ED Notes (Addendum)
Pt has not had significant amount of urine output, bladder scan measured 370, Dr Deetta Perla Verbal order to encourage pt to try to urinate and in and out if unable

## 2020-08-07 NOTE — Consult Note (Signed)
CARDIOLOGY CONSULT NOTE               Patient ID: Raven [name] MRN: [id number] DOB/AGE: 1934-05-05 86 y.o.  Admit date: 08/18/2020 Referring Physician: Athena Masse., MD  Primary Physician: Baxter Hire., MD  Primary Cardiologist: Daron Offer., MD  Reason for Consultation: NSVT, elevated troponin, Acute on chronic systolic CHF  HPI: Raven [name] is a 86 year old female with PMH significant for CAD, NSTEMI s/p PCI/stent to OM1 (08/10/2013), chronic combined systolic and diastolic CHF (EF= 87-86%), HTN, HLD, DM type II (diet controlled),  COPD (on supplemental oxygen at 2L via [place]), dementia,  rheumatoid arthritis, Crohns and recent Covid-19 pneumonia who presented to [facility] ED due to dyspnea and hypoxia. Patient was found to be in acute on chronic systolic CHF exacerbation with elevated troponin; thus Cardiology was consulted.   ED course: troponin was elevated at 254, BNP elevated at >4,500.0, Potassium decreased at 3.2, ECG revealed ST; however the patient experienced a nonsustained run of Ventricular Tachycardia that was treated with a 167m Amiodarone bolus. CXR reveals interim patchy peripheral ground-glass opacities within the bilateral lungs, questionable for atypical or viral pneumonia, in addition to  mild cardiomegaly with probable trace effusions and  Underlying emphysematous disease. The patient was given IV solu-medrol, oral potassium, duo nebs, IV lasix and IV magnesium.   The patient states that she is "okay" at this time. She denies having any chest pain and states that the dyspnea is slowly improving. The patient reports that she has been compliant with all of her medications that her facility gives to her. She denies having any peripheral edema; however, she does endorse BLE tenderness.  The patient is resting in the ED stretcher and the ultrasound tech is at the bedside assessing for a possible DVT in the LLE. The patient continues to be in NSR on the bedside  monitor, she is on supplemental oxygen via [place] and her vss. She does not appear to be in any apparent distress.   Review of systems complete and found to be negative unless listed above    Past Medical History:  Diagnosis Date  . Anemia   . Arthritis   . Asthma   . CHF (congestive heart failure) ([place]   . Complication of anesthesia   . COPD (chronic obstructive pulmonary disease) ([place]   . Crohn's disease ([place]   . Depression    after death of husband  . Hyperlipidemia   . Hypertension   . Myocardial infarction ([place]   . Osteopenia   . Peptic ulcer disease   . Pneumonia   . PONV (postoperative nausea and vomiting)     Past Surgical History:  Procedure Laterality Date  . ABDOMINAL SURGERY    . CARDIAC CATHETERIZATION     with stent  . COLONOSCOPY WITH PROPOFOL N/A 09/27/2014   Procedure: COLONOSCOPY WITH PROPOFOL;  Surgeon: PHulen Luster MD;  [facility];  Service: Gastroenterology;  Laterality: N/A;  . COLONOSCOPY WITH PROPOFOL N/A 06/21/2019   Procedure: COLONOSCOPY WITH PROPOFOL;  Surgeon: VLin Landsman MD;  [facility];  Service: Gastroenterology;  Laterality: N/A;  . EYE SURGERY     bilateral cataract surgeries  . SHOULDER SURGERY Right    x 2   . SMALL INTESTINE SURGERY      (Not in a hospital admission)  Social History   Socioeconomic History  . Marital status: Widowed    Spouse name: Not on file  . Number of children:  Not on file  . Years of education: Not on file  . Highest education level: Not on file  Occupational History  . Not on file  Tobacco Use  . Smoking status: Former Smoker    Quit date: 1974    Years since quitting: 48.4  . Smokeless tobacco: Never Used  Vaping Use  . Vaping Use: Never used  Substance and Sexual Activity  . Alcohol use: Yes    Comment: occassional  . Drug use: No  . Sexual activity: Never    Birth control/protection: Abstinence  Other Topics Concern  . Not on file  Social History Narrative   . Not on file   Social Determinants of Health   Financial Resource Strain: Not on file  Food Insecurity: Not on file  Transportation Needs: Not on file  Physical Activity: Not on file  Stress: Not on file  Social Connections: Not on file  Intimate Partner Violence: Not on file    Family History  Problem Relation Age of Onset  . Hypertension Other       Review of systems complete and found to be negative unless listed above      PHYSICAL EXAM  General: Well developed, well nourished, in no acute distress HEENT:  Normocephalic and atraumatic, PERRL Neck:  No JVD. Negative for rigidity Lungs: diminished bilaterally to auscultation. Chest expansion symmetrical. No rales, rhonchi or crackles.  Heart: HRRR . Normal S1 and S2 without gallops or murmurs.  Abdomen: Bowel sounds are positive, abdomen soft and non-tender  Msk:  Normal strength and tone for age. Extremities: +1 BLE edema. No clubbing or cyanosis.   Neuro: Alert and oriented X 3. Psych:  Good affect, responds appropriately  Labs:   Lab Results  Component Value Date   WBC 11.6 (H) 08/31/2020   HGB 13.6 08/11/2020   HCT 44.0 08/15/2020   MCV 90.9 08/31/2020   PLT 120 (L) 08/12/2020    Recent Labs  Lab 08/25/2020 0134  NA 145  K 3.2*  CL 99  CO2 30  BUN 37*  CREATININE 1.11*  CALCIUM 8.0*  PROT 5.5*  BILITOT 2.1*  ALKPHOS 500*  ALT 168*  AST 150*  GLUCOSE 100*   Lab Results  Component Value Date   CKTOTAL 71 11/24/2019   TROPONINI 0.03 (HH) 04/22/2016    Lab Results  Component Value Date   CHOL 127 03/09/2020   CHOL 131 08/26/2019   CHOL 129 06/09/2014   Lab Results  Component Value Date   HDL 43 03/09/2020   HDL 53 08/26/2019   HDL 52 06/09/2014   Lab Results  Component Value Date   LDLCALC 63 03/09/2020   LDLCALC 62 08/26/2019   LDLCALC 60 06/09/2014   Lab Results  Component Value Date   TRIG 104 03/09/2020   TRIG 78 08/26/2019   TRIG 87 06/09/2014   Lab Results   Component Value Date   CHOLHDL 3.0 03/09/2020   CHOLHDL 2.5 08/26/2019   No results found for: LDLDIRECT    Radiology: CT Head Wo Contrast  Result Date: 07/21/2020 CLINICAL DATA:  Altered mental status EXAM: CT HEAD WITHOUT CONTRAST TECHNIQUE: Contiguous axial images were obtained from the base of the skull through the vertex without intravenous contrast. COMPARISON:  None. FINDINGS: Brain: No evidence of acute infarction, hemorrhage, hydrocephalus, extra-axial collection or mass lesion/mass effect. Mild periventricular Kalev Temme matter hypodensity. Vascular: No hyperdense vessel or unexpected calcification. Skull: Normal. Negative for fracture or focal lesion. Sinuses/Orbits: No acute finding. Other:  None. IMPRESSION: No acute intracranial pathology. Small-vessel Laurelin Elson matter disease. Electronically Signed   By: Eddie Candle M.D.   On: 07/21/2020 12:33   CT CHEST WO CONTRAST  Result Date: 07/08/2020 CLINICAL DATA:  Persistent cough, short of breath EXAM: CT CHEST WITHOUT CONTRAST TECHNIQUE: Multidetector CT imaging of the chest was performed following the standard protocol without IV contrast. COMPARISON:  07/08/2020, 10/21/2018 FINDINGS: Cardiovascular: Unenhanced imaging of the heart and great vessels demonstrate mild cardiomegaly. No pericardial effusion. Normal caliber of the thoracic aorta. Extensive atherosclerosis of the aorta and coronary vessels. Evaluation of the vascular lumen is limited without IV contrast. Mediastinum/Nodes: No enlarged mediastinal or axillary lymph nodes. Thyroid gland, trachea, and esophagus demonstrate no significant findings. Lungs/Pleura: Upper lobe predominant emphysema is noted. There are small bilateral pleural effusions, right greater than left. Chronic areas of consolidation are seen at the right lung base consistent with atelectasis and scarring. No acute airspace disease. No pneumothorax. The central airways are patent. Upper Abdomen: No acute abnormality.  Musculoskeletal: No acute or destructive bony lesions. Reconstructed images demonstrate no additional findings. IMPRESSION: 1. Small bilateral pleural effusions, right greater than left. 2. Chronic consolidation at the right lung base, favor atelectasis and scarring. 3. Cardiomegaly. 4. Aortic Atherosclerosis (ICD10-I70.0) and Emphysema (ICD10-J43.9). Electronically Signed   By: Randa Ngo M.D.   On: 07/08/2020 19:53   CT ANGIO CHEST PE W OR WO CONTRAST  Result Date: 07/09/2020 CLINICAL DATA:  Cough, shortness of breath, heart failure and elevated D-dimer. EXAM: CT ANGIOGRAPHY CHEST WITH CONTRAST TECHNIQUE: Multidetector CT imaging of the chest was performed using the standard protocol during bolus administration of intravenous contrast. Multiplanar CT image reconstructions and MIPs were obtained to evaluate the vascular anatomy. CONTRAST:  73m OMNIPAQUE IOHEXOL 350 MG/ML SOLN COMPARISON:  CT of the chest without contrast yesterday and nuclear medicine pulmonary perfusion scan yesterday. FINDINGS: Cardiovascular: The pulmonary arteries are very well opacified. There is no evidence of acute pulmonary embolism. Central pulmonary arteries are normal in caliber. Stable cardiac enlargement. Stable calcified mitral annulus. No pericardial fluid identified. Extensive calcified coronary artery plaque in a 3 vessel distribution and probable stent in the distribution of the LAD/diagonal branch. Reflux of contrast into the IVC and hepatic veins is consistent with right heart failure. Stable atherosclerosis of the thoracic aorta and great vessel origins without evidence of aneurysmal disease. Mediastinum/Nodes: No enlarged mediastinal, hilar, or axillary lymph nodes. Thyroid gland, trachea, and esophagus demonstrate no significant findings. Lungs/Pleura: Stable small right pleural effusion and trace left pleural fluid. Lungs demonstrate stable interstitial prominence likely reflecting mild pulmonary interstitial edema.  No overt airspace edema, airspace consolidation or pneumothorax identified. No focal nodules. Mild atelectasis at the right lung base. Upper Abdomen: Stable trace ascites adjacent to the liver. Some degree of cirrhosis cannot be excluded based on the appearance of the visualized liver. Musculoskeletal: No chest wall abnormality. No acute or significant osseous findings. Review of the MIP images confirms the above findings. IMPRESSION: 1. No evidence of pulmonary embolism. 2. Stable evidence of mild pulmonary interstitial edema with small right pleural effusion and trace left pleural effusion. Evidence of right heart failure with reflux of contrast into the IVC and hepatic veins. 3. Extensive calcified coronary artery plaque with prior percutaneous coronary intervention. 4. Trace ascites adjacent to the liver and potential underlying cirrhosis. 5. Aortic atherosclerosis without evidence of aneurysm. Aortic Atherosclerosis (ICD10-I70.0). Electronically Signed   By: GAletta EdouardM.D.   On: 07/09/2020 11:26   NM Pulmonary Perfusion  Result Date: 07/09/2020 CLINICAL DATA:  Suspected.  Negative the diameter. EXAM: NUCLEAR MEDICINE PERFUSION LUNG SCAN TECHNIQUE: Perfusion images were obtained in multiple projections after intravenous injection of radiopharmaceutical. Ventilation scans intentionally deferred if perfusion scan and chest x-ray adequate for interpretation during COVID 19 epidemic. RADIOPHARMACEUTICALS:  4.41 mCi Tc-10mMAA IV COMPARISON:  CT chest 05/08/2020. FINDINGS: Findings consistent elevation right hemidiaphragm again noted. Probable cardiomegaly. Questionable left base perfusion defect. Scan is indeterminate pulmonary embolus. IMPRESSION: Indeterminate scan for pulmonary embolus. Electronically Signed   By: TMarcello Moores Register   On: 07/09/2020 09:07   [place]ous Img Lower Bilateral (DVT)  Result Date: 08/22/2020 CLINICAL DATA:  COPD.  Respiratory distress.  Leg weakness. EXAM: Bilateral LOWER  EXTREMITY VENOUS DOPPLER ULTRASOUND TECHNIQUE: Gray-scale sonography with compression, as well as color and duplex ultrasound, were performed to evaluate the deep venous system(s) from the level of the common femoral vein through the popliteal and proximal calf veins. COMPARISON:  07/09/2020 FINDINGS: VENOUS Normal compressibility of the common femoral, superficial femoral, and popliteal veins, as well as the visualized calf veins. Visualized portions of profunda femoral vein and great saphenous vein unremarkable. No filling defects to suggest DVT on grayscale or color Doppler imaging. Doppler waveforms show normal direction of venous flow, normal respiratory plasticity and response to augmentation. Limited views of the contralateral common femoral vein are unremarkable. OTHER Baker cyst again demonstrated in the left popliteal fossa. Limitations: none IMPRESSION: Negative for deep venous thrombosis in either lower extremity. Left Baker cyst as seen previously. Electronically Signed   By: MNelson ChimesM.D.   On: 08/28/2020 10:50   [place]ous Img Lower Bilateral (DVT)  Result Date: 07/09/2020 CLINICAL DATA:  Bilateral lower extremity swelling for 2-3 years. EXAM: BILATERAL LOWER EXTREMITY VENOUS DOPPLER ULTRASOUND TECHNIQUE: Gray-scale sonography with compression, as well as color and duplex ultrasound, were performed to evaluate the deep venous system(s) from the level of the common femoral vein through the popliteal and proximal calf veins. COMPARISON:  None. FINDINGS: VENOUS Normal compressibility of the common femoral, superficial femoral, and popliteal veins, as well as the visualized calf veins. Visualized portions of profunda femoral vein and great saphenous vein unremarkable. No filling defects to suggest DVT on grayscale or color Doppler imaging. Doppler waveforms show normal direction of venous flow, normal respiratory plasticity and response to augmentation. Limited views of the contralateral common  femoral vein are unremarkable. OTHER The patient has a Baker's cyst on the left measuring 3.4 x 1.0 x 3.4 cm. Limitations: None. IMPRESSION: Negative for DVT. Small Baker's cyst on the left. Electronically Signed   By: TInge RiseM.D.   On: 07/09/2020 11:49   CT ENTERO ABD/PELVIS W CONTAST  Result Date: 07/21/2020 CLINICAL DATA:  Possible Crohn's disease, history of diverticulitis, appendectomy EXAM: CT ABDOMEN AND PELVIS WITH CONTRAST (ENTEROGRAPHY) TECHNIQUE: Multidetector CT of the abdomen and pelvis during bolus administration of intravenous contrast. Negative oral contrast was given. CONTRAST:  841mOMNIPAQUE IOHEXOL 300 MG/ML  SOLN COMPARISON:  03/08/2020 FINDINGS: Lower chest: Small right, trace left pleural effusions and associated atelectasis or consolidation. Elevation of the right hemidiaphragm. Cardiomegaly. Three-vessel coronary artery calcifications. Hepatobiliary: No focal liver abnormality is seen. Status post cholecystectomy. No biliary dilatation. Pancreas: Unremarkable. No pancreatic ductal dilatation or surrounding inflammatory changes. Spleen: Normal in size without significant abnormality. Adrenals/Urinary Tract: Adrenal glands are unremarkable. Kidneys are normal, without renal calculi, solid lesion, or hydronephrosis. Bladder is unremarkable. Stomach/Bowel: Stomach is within normal limits. Evidence of prior distal small bowel  resection and reanastomosis (series 2, image 54). Appendix is surgically absent. Colonic diverticulosis. The colon is fluid-filled to the rectum. No evidence of bowel wall thickening, distention, or inflammatory changes. Vascular/Lymphatic: Aortic atherosclerosis. No enlarged abdominal or pelvic lymph nodes. Reproductive: No mass or other significant abnormality. Other: No abdominal wall hernia or abnormality. Anasarca. Small volume nonspecific free fluid in the low pelvis (series 2, image 80). Musculoskeletal: No acute or significant osseous findings.  IMPRESSION: 1. No acute inflammatory findings of the bowel. No evidence of inflammatory bowel disease. 2. Evidence of prior distal small bowel resection and reanastomosis. 3. Colonic diverticulosis without evidence of acute diverticulitis. 4. The colon is fluid-filled to the rectum, suggestive of diarrhea. 5. Small volume nonspecific free fluid in the low pelvis. 6. Small right, trace left pleural effusions and associated atelectasis or consolidation. Anasarca. 7. Cardiomegaly and coronary artery disease. Aortic Atherosclerosis (ICD10-I70.0). Electronically Signed   By: Eddie Candle M.D.   On: 07/21/2020 12:39   DG Chest Port 1 View  Result Date: 08/30/2020 CLINICAL DATA:  Shortness of breath EXAM: PORTABLE CHEST 1 VIEW COMPARISON:  07/21/2020, CT 07/09/2020, chest x-ray 08/25/2019 FINDINGS: Chronic elevation of the right diaphragm. Probable small bilateral effusions. Mild cardiomegaly with aortic atherosclerosis. Patchy somewhat peripheral ground-glass opacities within the bilateral lungs. IMPRESSION: 1. Interim patchy peripheral ground-glass opacities within the bilateral lungs, question atypical or viral pneumonia. 2. Mild cardiomegaly with probable trace effusions. Underlying emphysematous disease Electronically Signed   By: Donavan Foil M.D.   On: 08/30/2020 01:16   DG Chest Port 1 View  Result Date: 07/21/2020 CLINICAL DATA:  evaluate for sepsis. EXAM: PORTABLE CHEST 1 VIEW COMPARISON:  07/08/2020 FINDINGS: Normal heart size. Unchanged asymmetric elevation of the right hemidiaphragm. Small pleural effusions are noted, right greater than left. Mild diffuse interstitial edema is stable to increased in the interval. IMPRESSION: Bilateral pleural effusions and interstitial edema.  Unchanged. Electronically Signed   By: Kerby Moors M.D.   On: 07/21/2020 11:20   US ABDOMEN LIMITED RUQ (LIVER/GB)  Result Date: 07/21/2020 CLINICAL DATA:  Transaminitis EXAM: ULTRASOUND ABDOMEN LIMITED RIGHT UPPER  QUADRANT COMPARISON:  Abdominal ultrasound 06/13/2020 FINDINGS: Gallbladder: No gallstones or wall thickening visualized. No sonographic Murphy sign noted by sonographer. Common bile duct: Diameter: 0.5 cm, within normal limits Liver: No focal lesion identified. Within normal limits in parenchymal echogenicity. Portal vein is patent on color Doppler imaging with normal direction of blood flow towards the liver. Other: None. IMPRESSION: Unremarkable sonographic exam of the right upper quadrant. Electronically Signed   By: Audie Pinto M.D.   On: 07/21/2020 12:25    EKG: Sinus Tachycardia without evidence of ST changes.  Bedside tele: NSR with HR of 83 bpm.  ASSESSMENT AND PLAN:  Raven [name] is a 86 year old female with PMH significant for CAD, NSTEMI s/p PCI/stent to OM1 (08/10/2013), chronic combined systolic and diastolic CHF (EF= 31-59%), HTN, HLD, DM type II (diet controlled),  COPD (on supplemental oxygen at 2L via [place]), dementia,  rheumatoid arthritis, Crohns and recent Covid-19 pneumonia who presented to [facility] ED due to dyspnea and hypoxia. Initial troponin was elevated at 254 likely due to demand ischemia, BNP elevated at >4,500.0 likely due to CHF exacerbation. Initial ECG revealed ST; however the patient experienced a nonsustained run of Ventricular Tachycardia and successfully converted back to NSR with a 111m Amiodarone bolus. CXR reveals interim patchy peripheral ground-glass opacities within the bilateral lungs, likely due to suscpected bacterial pneumonia in the setting of recent Covid pneumonia in  addition to  mild cardiomegaly with probable trace effusions and underlying emphysematous disease. The patient appears stable at this time, she continues to be in NSR, vss and she is in no apparent distress.   1.  Nonsustained ventricular tachycardia with successful conversion back to normal sinus rhythm with 150 mg IV amiodarone bolus, stable  -Agree with admission to cardiac unit.  -Recommend  continuing electrolyte replacement and trending CMP.   -We will continue metoprolol therapy.  -We will consider amiodarone infusion if SVT recurs.  -Recommend continuous telemetry monitoring until discharge.  2.  Elevated troponin at 254, likely due to demand ischemia  -Recommend trend troponin.  -Recommend continuous telemetry monitoring until discharge.  -Recommend aspirin 81 mg once daily.  -Recommend heart healthy diet.  3.  Acute on chronic systolic and diastolic CHF, uncompensated, patient's BNP is >4500, fairly stable  -Recommend repeat echocardiogram.  -Agree with diuresis with furosemide IV.  -Continue metoprolol therapy.  -Consider restarting Entresto therapy as outpatient.  -Monitor daily weights, strict I's and O's and following low-sodium diet.  4.  CAD, previous NSTEMI s/p PCI/stent to OM1  -Recommend continuing aspirin therapy.  -Recommend heart healthy low-sodium diet.  5.  Acute on chronic respiratory failure with hypoxia, on supplemental oxygen  -RT evaluation and treatment appreciated.  -Agree with current management with duo nebulizers and supplemental oxygen.  6.  Suspected bacterial pneumonia likely due to recent COVID-19 pneumonia  -Agree with empiric antibiotic therapy.  7.  COPD with acute exacerbation, recently stable  -Agree with current management.  8.  Hypertension, reasonable control, patient is normotensive  -Recommend continuing metoprolol therapy.  -Consider restarting Entresto therapy.  9.  DVT prophylaxis  -Agree with Lovenox for DVT prophylaxis.  10.  Diabetes mellitus type 2 diet-controlled  -Agree with sliding scale insulin per protocol.   Signed: Adiyah Lame ACNPC-AG 08/30/2020, 1:55 PM

## 2020-08-07 NOTE — ED Notes (Signed)
Pt provided tissues as requested

## 2020-08-07 NOTE — ED Notes (Signed)
Pt to CT

## 2020-08-07 NOTE — ED Notes (Signed)
RN to bedside d/t alarm. Pt SpO2 64% with good pleth, AND noted. Pt noted to have taken off [place]. Placed back on [place] with no improvement. Placed on NRB with improvement to 94% quickly. Taken off NRB and remains on 3L [place]. NAD noted

## 2020-08-07 NOTE — H&P (Addendum)
History and Physical    KANIA REGNIER EFE:[id number] DOB: [birth year] DOA: 08/21/2020  PCP: Baxter Hire, MD   Patient coming from: SNF  I have personally briefly reviewed patient's old medical records in [place]  Chief Complaint: shortness of breath  HPI: Raven [name] is a 86 y.o. 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  O2 sats 60s by nursing home staff, upper 80s with EMS.  Patient denied chest pain, fever or chills and denies abdominal pain, nausea, vomiting or diarrhea. ED course: On arrival afebrile with soft blood pressure of 95/49 and tachycardic at 117 with respirations 26.  BP improved to 110/98 on its own.  Patient had a nonsustained run of V. tach in the ED treated with an amiodarone bolus.  Blood work returned with troponin of 254, D-dimer 1.92.  WBC 11.6, creatinine 1.11 up from baseline of 0.99.  She had abnormal liver enzymes with AST/ALT of 150/168, alk phos of 500 and total bili of 2.1.  Liver enzymes appear to be slightly worse than baseline. EKG, personally viewed and interpreted: Sinus tach at 121 with no acute ST-T wave changes Chest x-ray with mild cardiomegaly, probable trace effusions, interim patchy peripheral groundglass opacities within bilateral lungs, question atypical or viral pneumonia.  Patient treated with duo nebs x3 and Solu-Medrol and given amiodarone bolus.  Hospitalist consulted for admission. Review of Systems: Limited due to clinical condition   Past Medical History:  Diagnosis Date  . Anemia   . Arthritis   . Asthma   . CHF  (congestive heart failure) ([place])   . Complication of anesthesia   . COPD (chronic obstructive pulmonary disease) ([place])   . Crohn's disease ([place])   . Depression    after death of husband  . Hyperlipidemia   . Hypertension   . Myocardial infarction ([place])   . Osteopenia   . Peptic ulcer disease   . Pneumonia   . PONV (postoperative nausea and vomiting)     Past Surgical History:  Procedure Laterality Date  . ABDOMINAL SURGERY    . CARDIAC CATHETERIZATION     with stent  . COLONOSCOPY WITH PROPOFOL N/A 09/27/2014   Procedure: COLONOSCOPY WITH PROPOFOL;  Surgeon: [name], MD;  Location: [facility] ENDOSCOPY;  Service: Gastroenterology;  Laterality: N/A;  . COLONOSCOPY WITH PROPOFOL N/A 06/21/2019   Procedure: COLONOSCOPY WITH PROPOFOL;  Surgeon: Lin Landsman, MD;  Location: [facility] ENDOSCOPY;  Service: Gastroenterology;  Laterality: N/A;  . EYE SURGERY     bilateral cataract surgeries  . SHOULDER SURGERY Right    x 2   . SMALL INTESTINE SURGERY       reports that she quit smoking about 48 years ago. She has never used smokeless tobacco. She reports current alcohol use. She reports that she does not use drugs.  Allergies  Allergen Reactions  . Penicillins Other (See Comments)    Reaction:  Unknown  Has patient had a PCN reaction causing immediate rash, facial/tongue/throat swelling, SOB or lightheadedness with hypotension: unknown Has patient had a PCN reaction causing severe rash involving mucus membranes or skin necrosis: unknown Has patient had a PCN reaction  that required hospitalization No Has patient had a PCN reaction occurring within the last 10 years: No If all of the above answers are "NO", then may proceed with Cephalosporin use.   . Augmentin [Amoxicillin-Pot Clavulanate] Other (See Comments)    Reaction:  Unknown   . Indocin [Indomethacin] Other (See Comments)    Reaction:  Unknown   . Lodine [Etodolac] Other (See Comments)    Reaction:   GI upset   .  Methotrexate Derivatives Other (See Comments)    Reaction:  GI upset   . Gold Rash  . Zantac [Ranitidine Hcl] Rash    Family History  Problem Relation Age of Onset  . Hypertension Other       Prior to Admission medications   Medication Sig Start Date End Date Taking? Authorizing Provider  acetaminophen (TYLENOL) 500 MG tablet Take 500-1,000 mg by mouth every 6 (six) hours as needed for mild pain, moderate pain or fever.    [provider]  Adalimumab 40 MG/0.8ML PSKT Inject 40 mg into the skin every 30 (thirty) days.     [provider]  albuterol (PROVENTIL HFA;VENTOLIN HFA) 108 (90 BASE) MCG/ACT inhaler Inhale 2 puffs into the lungs every 6 (six) hours as needed for wheezing or shortness of breath.    [provider]  allopurinol (ZYLOPRIM) 100 MG tablet Take 1 tablet (100 mg total) by mouth daily. 07/09/20 07/09/21  Loletha Grayer, MD  amitriptyline (ELAVIL) 10 MG tablet Take 20 mg by mouth at bedtime.    [provider]  ANORO ELLIPTA 62.5-25 MCG/INH AEPB Inhale 1 puff into the lungs daily. 05/29/19   [provider]  aspirin EC 81 MG tablet Take 81 mg by mouth daily.    [provider]  atorvastatin (LIPITOR) 80 MG tablet Take 80 mg by mouth daily.    [provider]  Calcium-Phosphorus-Vitamin D (CITRACAL +D3 PO) Take 1 tablet by mouth daily.    [provider]  chlorpheniramine-HYDROcodone (TUSSIONEX) 10-8 MG/5ML SUER Take 5 mLs by mouth every 12 (twelve) hours as needed (cough unrelieved with robitussen). 07/09/20   Loletha Grayer, MD  Cobalamin Combinations (VITAMIN B12-FOLIC ACID) 782-423 MCG TABS Take 1 tablet by mouth as directed.    [provider]  colchicine 0.6 MG tablet Take 1 tablet (0.6 mg total) by mouth daily. 07/10/20   Loletha Grayer, MD  dextromethorphan-guaiFENesin [facility] DM) 30-600 MG 12hr tablet Take 1 tablet by mouth 2 (two) times daily as needed for cough. 03/11/20   Nicole Kindred A,  DO  ENTRESTO 24-26 MG TAKE ONE TABLET BY MOUTH TWICE DAILY Patient taking differently: Take 1 tablet by mouth 2 (two) times daily. 02/29/20   Alisa Graff, FNP  esomeprazole (NEXIUM) 40 MG capsule Take 40 mg by mouth daily. 05/29/19   [provider]  folic acid (FOLVITE) 536 MCG tablet Take 400 mcg by mouth daily.    [provider]  furosemide (LASIX) 40 MG tablet Take 40 mg by mouth daily.    [provider]  leflunomide (ARAVA) 10 MG tablet Take 10 mg by mouth daily. 03/29/19   [provider]  mesalamine (PENTASA) 500 MG CR capsule Take 500 mg by mouth 2 (two) times daily.    [provider]  metoprolol succinate (TOPROL-XL) 25 MG 24 hr tablet Take 3 tablets (75 mg total) by mouth daily. Take with or immediately following a meal. 07/10/20   Loletha Grayer, MD  Multiple Vitamin (MULTIVITAMIN WITH MINERALS) TABS tablet  Take 1 tablet by mouth daily.    [provider]  ondansetron (ZOFRAN-ODT) 4 MG disintegrating tablet Take 4 mg by mouth every 12 (twelve) hours as needed for nausea/vomiting. 07/03/20   [provider]  potassium chloride (KLOR-CON) 10 MEQ tablet Take 10 mEq by mouth daily.    [provider]  thiamine (VITAMIN B-1) 100 MG tablet Take 100 mg by mouth daily.    [provider]  torsemide (DEMADEX) 20 MG tablet Take 1 tablet (20 mg total) by mouth 2 (two) times daily. 07/09/20   Loletha Grayer, MD  traZODone (DESYREL) 50 MG tablet Take 1 tablet (50 mg total) by mouth at bedtime as needed for sleep. 07/09/20   Loletha Grayer, MD  vitamin E 400 UNIT capsule Take 400 Units by mouth daily.    [provider]    Physical Exam: Vitals:   08/24/2020 0200 08/27/2020 0215 09/02/2020 0225 08/10/2020 0230  BP: (!) 86/56 (!) 131/40 (!) 94/49 (!) 110/98  Pulse: 66 64 81 67  Resp: (!) 23 (!) 25 (!) 24 (!) 25  Temp:      TempSrc:      SpO2: 95% 90% 100% 90%  Weight:      Height:         Vitals:    08/29/2020 0200 08/26/2020 0215 08/13/2020 0225 08/16/2020 0230  BP: (!) 86/56 (!) 131/40 (!) 94/49 (!) 110/98  Pulse: 66 64 81 67  Resp: (!) 23 (!) 25 (!) 24 (!) 25  Temp:      TempSrc:      SpO2: 95% 90% 100% 90%  Weight:      Height:          Constitutional:  Lethargic and oriented x 2 . Not in any apparent distress HEENT:      Head: Normocephalic and atraumatic.         Eyes: PERLA, EOMI, Conjunctivae are normal. Sclera is non-icteric.       Mouth/Throat: Mucous membranes are moist.       Neck: Supple with no signs of meningismus. Cardiovascular: Regular rate and rhythm. No murmurs, gallops, or rubs. 2+ symmetrical distal pulses are present . No JVD.  1 LE edema Respiratory: Respiratory effort increased.Lungs sounds diminished bilaterally. No wheezes, crackles, or rhonchi.  Gastrointestinal: Soft, non tender, and non distended with positive bowel sounds.  Genitourinary: No CVA tenderness. Musculoskeletal: Nontender with normal range of motion in all extremities. No cyanosis, or erythema of extremities. Neurologic:  Face is symmetric. Moving all extremities. No gross focal neurologic deficits . Skin: Skin is warm, dry.  No rash or ulcers Psychiatric: Mood and affect are normal    Labs on Admission: I have personally reviewed following labs and imaging studies  CBC: Recent Labs  Lab 08/11/2020 0134  WBC 11.6*  NEUTROABS 10.6*  HGB 13.6  HCT 44.0  MCV 90.9  PLT 948*   Basic Metabolic Panel: Recent Labs  Lab 08/14/2020 0134  NA 145  K 3.2*  CL 99  CO2 30  GLUCOSE 100*  BUN 37*  CREATININE 1.11*  CALCIUM 8.0*   GFR: Estimated Creatinine Clearance: 35.3 mL/min (A) (by C-G formula based on SCr of 1.11 mg/dL (H)). Liver Function Tests: Recent Labs  Lab 09/01/2020 0134  AST 150*  ALT 168*  ALKPHOS 500*  BILITOT 2.1*  PROT 5.5*  ALBUMIN 2.9*   No results for input(s): LIPASE, AMYLASE in the last 168 hours. No results for input(s): AMMONIA in the last 168  hours. Coagulation Profile: No results for input(s): INR, PROTIME in the last 168 hours. Cardiac Enzymes: No results for input(s): CKTOTAL, CKMB, CKMBINDEX, TROPONINI in the last 168 hours. BNP (last 3 results) No results for input(s): PROBNP in the last 8760 hours. HbA1C: No results for input(s): HGBA1C in the last 72 hours. CBG: No results for input(s): GLUCAP in the last 168 hours. Lipid Profile: No results for input(s): CHOL, HDL, LDLCALC, TRIG, CHOLHDL, LDLDIRECT in the last 72 hours. Thyroid Function Tests: No results for input(s): TSH, T4TOTAL, FREET4, T3FREE, THYROIDAB in the last 72 hours. Anemia Panel: No results for input(s): VITAMINB12, FOLATE, FERRITIN, TIBC, IRON, RETICCTPCT in the last 72 hours. Urine analysis:    Component Value Date/Time   COLORURINE YELLOW (A) 07/21/2020 1315   APPEARANCEUR CLEAR (A) 07/21/2020 1315   APPEARANCEUR Clear 06/08/2014 2052   LABSPEC 1.017 07/21/2020 1315   LABSPEC 1.015 06/08/2014 2052   PHURINE 5.0 07/21/2020 1315   GLUCOSEU NEGATIVE 07/21/2020 1315   GLUCOSEU Negative 06/08/2014 2052   HGBUR MODERATE (A) 07/21/2020 [place] 07/21/2020 1315   BILIRUBINUR Negative 06/08/2014 2052   KETONESUR NEGATIVE 07/21/2020 1315   PROTEINUR NEGATIVE 07/21/2020 1315   NITRITE NEGATIVE 07/21/2020 1315   LEUKOCYTESUR MODERATE (A) 07/21/2020 1315   LEUKOCYTESUR Negative 06/08/2014 2052    Radiological Exams on Admission: DG Chest Port 1 View  Result Date: 08/13/2020 CLINICAL DATA:  Shortness of breath EXAM: PORTABLE CHEST 1 VIEW COMPARISON:  07/21/2020, CT 07/09/2020, chest x-ray 08/25/2019 FINDINGS: Chronic elevation of the right diaphragm. Probable small bilateral effusions. Mild cardiomegaly with aortic atherosclerosis. Patchy somewhat peripheral ground-glass opacities within the bilateral lungs. IMPRESSION: 1. Interim patchy peripheral ground-glass opacities within the bilateral lungs, question atypical or viral pneumonia.  2. Mild cardiomegaly with probable trace effusions. Underlying emphysematous disease Electronically Signed   By: Donavan Foil M.D.   On: 08/26/2020 01:16     Assessment/Plan 86 year old female with history of CAD, diet-controlled DM, systolic CHF last EF 45 to 50% 03/2020, steroid-dependent COPD on home O2 at 2 L, dementia, on chronic immunosuppressive medication for Crohn's and rheumatoid arthritis, recently hospitalized from 5/15-5/23 with COVID-pneumonia s/p remdesivir and steroids and with 2 recent admissions for CHF exacerbation with hypoxia who now presents with a 2-day history of shortness of breath    Acute on chronic respiratory failure with hypoxia  -Patient with shortness of breath and increased work of breathing with O2 sats 60% at the facility -Suspect multifactorial related mainly to bacterial pneumonia as a complication of recent COVID-pneumonia, CHF exacerbation, COPD exacerbation.  Less likely worsening COVID infection given completed treatment a couple weeks prior -O2 sat was in the 67s at nursing home but with upper 80s with EMS - Supplemental O2 to keep sats over 92% - Treat potential etiologies  Suspect bacterial pneumonia in the setting of recent COVID pneumonia - Chest x-ray showing interim patchy peripheral groundglass opacities within bilateral lungs, question atypical or viral pneumonia - Procalcitonin elevated at 0.79 and patient with elevation of inflammatory biomarkers of D-dimer and ferritin.  CRP pending - Rocephin and azithromycin and Vanco given recent hospitalization - Uncertain benefit baricitinib given this far out from recent COVID diagnosis - Follow blood cultures - Consider ID consult in the a.m.    Non-sustained ventricular tachycardia ([place]) - Patient had a 12-15 beat run of V. tach in the emergency room which was treated with a single amiodarone 150 mg IV bolus - Continue to monitor - Continue home Toprol for  now - If episode repeats may consider  repeat bolus followed by infusion - Monitor and correct any electrolyte abnormalities.  Keep potassium above 4 and mag over 2 - Cardiology consult    Elevated troponin   CAD (coronary artery disease) - Troponin 254, suspect related to demand.  During recent past hospitalization troponin also elevated to the 200s - Continue to trend troponins - Continue aspirin, Toprol and atorvastatin  Acute on chronic systolic CHF (congestive heart failure) (HCC) - Patient with shortness of breath and BNP over 4600 With mild cardiomegaly and probable trace effusions on chest x-ray - Last echo January 2022 with EF 45 to 50% - IV Lasix and continue metoprolol and Entresto - Daily weights intake and output monitoring - Consider echocardiogram in the a.m. - Cardiology consult  COPD with acute exacerbation - Patient was treated with DuoNeb in the emergency room - No overt wheezing heard - DuoNeb as needed  Rheumatoid arthritis Crohn's disease - Patient on Arava and Humira - We will hold during this hospitalization  Diabetes Mellitus - Sliding scale insulin coverage    HTN (hypertension)   Crohn's disease (HCC)    Abnormal liver enzymes - Elevation in transaminases and bilirubin likely related to hepatic congestion from heart failure exacerbation    DVT prophylaxis: Lovenox  Code Status: full code  Family Communication:  none  Disposition Plan: Back to previous home environment Consults called: Cardiology Status:At the time of admission, it appears that the appropriate admission status for this patient is INPATIENT. This is judged to be reasonable and necessary in order to provide the required intensity of service to ensure the patient's safety given the presenting symptoms, physical exam findings, and initial radiographic and laboratory data in the context of their  Comorbid conditions.   Patient requires inpatient status due to high intensity of service, high risk for further deterioration  and high frequency of surveillance required.   I certify that at the point of admission it is my clinical judgment that the patient will require inpatient hospital care spanning beyond [place] MD [facility]     08/18/2020, 2:57 AM

## 2020-08-07 NOTE — ED Notes (Signed)
Dr Tyrell Antonio messaged via secure chat to notify of family at bedside requesting to speak to provider regarding tests done today and results.

## 2020-08-07 NOTE — ED Notes (Signed)
Pharmacy contacted for [facility]

## 2020-08-07 NOTE — ED Notes (Signed)
Pt to nuc med

## 2020-08-07 NOTE — ED Triage Notes (Signed)
Pt is from a local nursing home. Pt states that she has been having SOB for the past  Couple days. Nursing home states she was hypoxic 60%, ems states she was in upper 37s

## 2020-08-08 DIAGNOSIS — J9600 Acute respiratory failure, unspecified whether with hypoxia or hypercapnia: Secondary | ICD-10-CM

## 2020-08-08 DIAGNOSIS — I469 Cardiac arrest, cause unspecified: Secondary | ICD-10-CM

## 2020-08-08 LAB — ECHOCARDIOGRAM COMPLETE
AR max vel: 1.55 cm2
AV Area VTI: 1.52 cm2
AV Area mean vel: 1.65 cm2
AV Mean grad: 3 mmHg
AV Peak grad: 6.1 mmHg
Ao pk vel: 1.23 m/s
Area-P 1/2: 4.41 cm2
Height: 60 in
MV VTI: 1.46 cm2
P 1/2 time: 552 msec
S' Lateral: 4.3 cm
Weight: 2821.89 oz

## 2020-08-08 LAB — GLUCOSE, CAPILLARY: Glucose-Capillary: 187 mg/dL — ABNORMAL HIGH (ref 70–99)

## 2020-08-08 MED ORDER — DOPAMINE-DEXTROSE 3.2-5 MG/ML-% IV SOLN: 0.0000 ug/kg/min | INTRAVENOUS | Status: DC

## 2020-08-08 MED ORDER — IPRATROPIUM-ALBUTEROL 20-100 MCG/ACT IN AERS
1.0000 | INHALATION_SPRAY | Freq: Four times a day (QID) | RESPIRATORY_TRACT | Status: DC
  Administered 2020-08-08: 1 via RESPIRATORY_TRACT
  Filled 2020-08-08: qty 4

## 2020-08-09 MED FILL — Medication: Qty: 1 | Status: AC

## 2020-08-12 ENCOUNTER — Ambulatory Visit: Payer: Medicare Other | Admitting: Family

## 2020-09-06 NOTE — Progress Notes (Signed)
Code Blue called rm 253 to ICU 2, patient being cared for by medical staff. Doctor advised family of patient change. Family presence and taken to bedside after being informed of death.

## 2020-09-06 NOTE — Progress Notes (Signed)
Found patient lying in the decrease level of consciousness, then she she became unresponsive  and pulseless , code blue called CPR started , critical care team and MD at bedside patient taken to ICU . Unable  reach patient primary contact, patient son Raven [name] was informed of change of condition by MD

## 2020-09-06 NOTE — Death Summary Note (Signed)
DEATH SUMMARY   Patient Details  Name: Raven [name] MRN: [id number] DOB: 1934-06-08  Admission/Discharge Information   Admit Date:  2020-08-11  Date of Death: Date of Death: 08/12/20  Time of Death: Time of Death: 0620  Length of Stay: 1  Referring Physician: Baxter Hire, MD   Reason(s) for Hospitalization  Acute hypoxic respiratory failure  Diagnoses  Preliminary cause of death:  Secondary Diagnoses (including complications and co-morbidities):  Principal Problem:   Bacterial pneumonia Active Problems:   COPD with acute exacerbation ([place])   Acute on chronic respiratory failure with hypoxia ([facility])   CAD (coronary artery disease)   Elevated troponin   HTN (hypertension)   Crohn's disease ([place])   Chronic systolic CHF (congestive heart failure) ([place])   Rheumatoid arthritis ([place])   Type 2 diabetes mellitus with hyperlipidemia ([place])   Non-sustained ventricular tachycardia ([place])   Abnormal liver enzymes   Brief Hospital Course (including significant findings, care, treatment, and services provided and events leading to death)  DETRIA CUMMINGS is a 86 y.o. 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  ED Course: Evaluation in the ED patient was found to have nonsustained run of V. tach she received IV amiodarone. D-dimer 1.9, chest x-ray showed mild cardiomegaly, probably trace effusion patchy peripheral groundglass opacities with bilateral [place] question atypical versus viral pneumonia.  Hospital Course: During the course of her  hospitalization, cardiology was consulted for elevated troponin and non sustained vtach.VQ scan inconclusive. CT Angio was negative for PE. Doppler negative for DVT. At around 5 am this morning, CODE BLUE was called on the patient for PEA cardiac arrest.  CPR was immediately started and the patient was bagged.  The patient was given 1 mg of IV epinephrine for 3 doses as well as 1 amp of sodium bicarbonate and wide-open IV normal saline and was intubated by Dr. Leonides Schanz with 7.5 cuffed ET tube via portable glidoscope.  She became bradycardic and was given 1 mg of IV atropine. She was immediately transferred to the ICU.  Due to difficulty with IV access, central line was attempted but during the process the patient lost her pulse again and ACLS was resumed.  She received another milligram of IV atropine twice and 1 amp of sodium bicarb and and a milligram of IV atropine.  She regained pulse again and was ordered IV Levophed and given persistent bradycardia additional epi administered via ETT. Patient's family was contacted by Hospitalist, and they made a decision to discontinue further resuscitative efforts.   After total of about 47 minutes of CPR/ACLS the patient was in asystole and was pronounced dead at 6.20 am on 08-12-20.  Pertinent Labs and Studies  Significant Diagnostic Studies CT Head Wo Contrast  Result Date: 07/21/2020 CLINICAL DATA:  Altered mental status EXAM: CT HEAD WITHOUT CONTRAST TECHNIQUE: Contiguous axial images were obtained from the base of the skull through the vertex without intravenous contrast. COMPARISON:  None. FINDINGS: Brain: No evidence of acute infarction, hemorrhage, hydrocephalus, extra-axial collection or mass lesion/mass effect. Mild periventricular white matter hypodensity. Vascular: No hyperdense vessel or unexpected calcification.  Skull: Normal. Negative for fracture or focal lesion. Sinuses/Orbits: No acute finding. Other: None. IMPRESSION: No acute intracranial pathology.  Small-vessel white matter disease. Electronically Signed   By: Eddie Candle M.D.   On: 07/21/2020 12:33   CT ANGIO CHEST PE W OR WO CONTRAST  Result Date: 09/03/2020 CLINICAL DATA:  Respiratory distress, history of recent COVID-19 positivity EXAM: CT ANGIOGRAPHY CHEST WITH CONTRAST TECHNIQUE: Multidetector CT imaging of the chest was performed using the standard protocol during bolus administration of intravenous contrast. Multiplanar CT image reconstructions and MIPs were obtained to evaluate the vascular anatomy. CONTRAST:  60m OMNIPAQUE IOHEXOL 350 MG/ML SOLN COMPARISON:  Chest x-ray from earlier in the same day, CT from 07/09/2020. FINDINGS: Cardiovascular: Atherosclerotic calcifications of the thoracic aorta are noted. No aneurysmal dilatation is seen. Cardiomegaly is noted. Coronary calcifications are noted. Pulmonary artery is well visualized within normal branching pattern. No filling defect to suggest pulmonary embolism is noted. Mediastinum/Nodes: Thoracic inlet is within normal limits. Scattered small hilar and mediastinal lymph nodes are noted similar to that seen on the prior exam. No sizable adenopathy is noted. The esophagus is within normal limits. Lungs/Pleura: Lungs are well aerated bilaterally. Emphysematous changes are noted. Patchy airspace opacity is noted consistent with the given clinical history of COVID-19 positivity. Small right-sided pleural effusion is noted. No sizable parenchymal nodule is noted. Upper Abdomen: Visualized upper abdomen is within normal limits. Musculoskeletal: Degenerative changes of the thoracic spine are noted. Review of the MIP images confirms the above findings. IMPRESSION: Small right-sided pleural effusion. No evidence of pulmonary emboli. Patchy airspace opacity is new from the prior CT examination most likely sequelae from the prior COVID-19 infection. Aortic Atherosclerosis (ICD10-I70.0) and Emphysema (ICD10-J43.9). Electronically Signed   By: MInez Catalina M.D.   On: 08/09/2020 17:05   CT ANGIO CHEST PE W OR WO CONTRAST  Result Date: 07/09/2020 CLINICAL DATA:  Cough, shortness of breath, heart failure and elevated D-dimer. EXAM: CT ANGIOGRAPHY CHEST WITH CONTRAST TECHNIQUE: Multidetector CT imaging of the chest was performed using the standard protocol during bolus administration of intravenous contrast. Multiplanar CT image reconstructions and MIPs were obtained to evaluate the vascular anatomy. CONTRAST:  724mOMNIPAQUE IOHEXOL 350 MG/ML SOLN COMPARISON:  CT of the chest without contrast yesterday and nuclear medicine pulmonary perfusion scan yesterday. FINDINGS: Cardiovascular: The pulmonary arteries are very well opacified. There is no evidence of acute pulmonary embolism. Central pulmonary arteries are normal in caliber. Stable cardiac enlargement. Stable calcified mitral annulus. No pericardial fluid identified. Extensive calcified coronary artery plaque in a 3 vessel distribution and probable stent in the distribution of the LAD/diagonal branch. Reflux of contrast into the IVC and hepatic veins is consistent with right heart failure. Stable atherosclerosis of the thoracic aorta and great vessel origins without evidence of aneurysmal disease. Mediastinum/Nodes: No enlarged mediastinal, hilar, or axillary lymph nodes. Thyroid gland, trachea, and esophagus demonstrate no significant findings. Lungs/Pleura: Stable small right pleural effusion and trace left pleural fluid. Lungs demonstrate stable interstitial prominence likely reflecting mild pulmonary interstitial edema. No overt airspace edema, airspace consolidation or pneumothorax identified. No focal nodules. Mild atelectasis at the right lung base. Upper Abdomen: Stable trace ascites adjacent to the liver. Some degree of cirrhosis cannot be excluded based on the appearance of the visualized liver. Musculoskeletal: No chest wall abnormality. No acute or significant osseous findings. Review of the MIP images  confirms the above findings. IMPRESSION: 1. No evidence of pulmonary embolism. 2. Stable evidence of mild pulmonary interstitial edema  with small right pleural effusion and trace left pleural effusion. Evidence of right heart failure with reflux of contrast into the IVC and hepatic veins. 3. Extensive calcified coronary artery plaque with prior percutaneous coronary intervention. 4. Trace ascites adjacent to the liver and potential underlying cirrhosis. 5. Aortic atherosclerosis without evidence of aneurysm. Aortic Atherosclerosis (ICD10-I70.0). Electronically Signed   By: Aletta Edouard M.D.   On: 07/09/2020 11:26   NM Pulmonary Perfusion  Result Date: 08/28/2020 CLINICAL DATA:  Recent coronavirus infection.  Shortness of breath. EXAM: NUCLEAR MEDICINE PERFUSION LUNG SCAN TECHNIQUE: Perfusion images were obtained in multiple projections after intravenous injection of radiopharmaceutical. Ventilation scans intentionally deferred if perfusion scan and chest x-ray adequate for interpretation during COVID 19 epidemic. RADIOPHARMACEUTICALS:  4.02 mCi Tc-14mMAA IV COMPARISON:  Chest radiography same day. Previous perfusion study 07/09/2020 FINDINGS: Today's study suffers from severe clumping of the MAA. Because of this, the study is nondiagnostic. Consider CT angiography with contrast if concern persists. IMPRESSION: Nondiagnostic study because of severe clumping of the MAA. Consider CT angiography with contrast if concern persists. Electronically Signed   By: MNelson ChimesM.D.   On: 08/16/2020 14:44   NM Pulmonary Perfusion  Result Date: 07/09/2020 CLINICAL DATA:  Suspected.  Negative the diameter. EXAM: NUCLEAR MEDICINE PERFUSION LUNG SCAN TECHNIQUE: Perfusion images were obtained in multiple projections after intravenous injection of radiopharmaceutical. Ventilation scans intentionally deferred if perfusion scan and chest x-ray adequate for interpretation during COVID 19 epidemic. RADIOPHARMACEUTICALS:  4.41  mCi Tc-942mAA IV COMPARISON:  CT chest 05/08/2020. FINDINGS: Findings consistent elevation right hemidiaphragm again noted. Probable cardiomegaly. Questionable left base perfusion defect. Scan is indeterminate pulmonary embolus. IMPRESSION: Indeterminate scan for pulmonary embolus. Electronically Signed   By: ThMarcello MooresRegister   On: 07/09/2020 09:07   [place]ous Img Lower Bilateral (DVT)  Result Date: 08/25/2020 CLINICAL DATA:  COPD.  Respiratory distress.  Leg weakness. EXAM: Bilateral LOWER EXTREMITY VENOUS DOPPLER ULTRASOUND TECHNIQUE: Gray-scale sonography with compression, as well as color and duplex ultrasound, were performed to evaluate the deep venous system(s) from the level of the common femoral vein through the popliteal and proximal calf veins. COMPARISON:  07/09/2020 FINDINGS: VENOUS Normal compressibility of the common femoral, superficial femoral, and popliteal veins, as well as the visualized calf veins. Visualized portions of profunda femoral vein and great saphenous vein unremarkable. No filling defects to suggest DVT on grayscale or color Doppler imaging. Doppler waveforms show normal direction of venous flow, normal respiratory plasticity and response to augmentation. Limited views of the contralateral common femoral vein are unremarkable. OTHER Baker cyst again demonstrated in the left popliteal fossa. Limitations: none IMPRESSION: Negative for deep venous thrombosis in either lower extremity. Left Baker cyst as seen previously. Electronically Signed   By: MaNelson Chimes.D.   On: 08/30/2020 10:50   [place] Img Lower Bilateral (DVT)  Result Date: 07/09/2020 CLINICAL DATA:  Bilateral lower extremity swelling for 2-3 years. EXAM: BILATERAL LOWER EXTREMITY VENOUS DOPPLER ULTRASOUND TECHNIQUE: Gray-scale sonography with compression, as well as color and duplex ultrasound, were performed to evaluate the deep venous system(s) from the level of the common femoral vein through the popliteal and  proximal calf veins. COMPARISON:  None. FINDINGS: VENOUS Normal compressibility of the common femoral, superficial femoral, and popliteal veins, as well as the visualized calf veins. Visualized portions of profunda femoral vein and great saphenous vein unremarkable. No filling defects to suggest DVT on grayscale or color Doppler imaging. Doppler waveforms show normal direction of venous  flow, normal respiratory plasticity and response to augmentation. Limited views of the contralateral common femoral vein are unremarkable. OTHER The patient has a Baker's cyst on the left measuring 3.4 x 1.0 x 3.4 cm. Limitations: None. IMPRESSION: Negative for DVT. Small Baker's cyst on the left. Electronically Signed   By: Inge Rise M.D.   On: 07/09/2020 11:49   CT ENTERO ABD/PELVIS W CONTAST  Result Date: 07/21/2020 CLINICAL DATA:  Possible Crohn's disease, history of diverticulitis, appendectomy EXAM: CT ABDOMEN AND PELVIS WITH CONTRAST (ENTEROGRAPHY) TECHNIQUE: Multidetector CT of the abdomen and pelvis during bolus administration of intravenous contrast. Negative oral contrast was given. CONTRAST:  50m OMNIPAQUE IOHEXOL 300 MG/ML  SOLN COMPARISON:  03/08/2020 FINDINGS: Lower chest: Small right, trace left pleural effusions and associated atelectasis or consolidation. Elevation of the right hemidiaphragm. Cardiomegaly. Three-vessel coronary artery calcifications. Hepatobiliary: No focal liver abnormality is seen. Status post cholecystectomy. No biliary dilatation. Pancreas: Unremarkable. No pancreatic ductal dilatation or surrounding inflammatory changes. Spleen: Normal in size without significant abnormality. Adrenals/Urinary Tract: Adrenal glands are unremarkable. Kidneys are normal, without renal calculi, solid lesion, or hydronephrosis. Bladder is unremarkable. Stomach/Bowel: Stomach is within normal limits. Evidence of prior distal small bowel resection and reanastomosis (series 2, image 54). Appendix is  surgically absent. Colonic diverticulosis. The colon is fluid-filled to the rectum. No evidence of bowel wall thickening, distention, or inflammatory changes. Vascular/Lymphatic: Aortic atherosclerosis. No enlarged abdominal or pelvic lymph nodes. Reproductive: No mass or other significant abnormality. Other: No abdominal wall hernia or abnormality. Anasarca. Small volume nonspecific free fluid in the low pelvis (series 2, image 80). Musculoskeletal: No acute or significant osseous findings. IMPRESSION: 1. No acute inflammatory findings of the bowel. No evidence of inflammatory bowel disease. 2. Evidence of prior distal small bowel resection and reanastomosis. 3. Colonic diverticulosis without evidence of acute diverticulitis. 4. The colon is fluid-filled to the rectum, suggestive of diarrhea. 5. Small volume nonspecific free fluid in the low pelvis. 6. Small right, trace left pleural effusions and associated atelectasis or consolidation. Anasarca. 7. Cardiomegaly and coronary artery disease. Aortic Atherosclerosis (ICD10-I70.0). Electronically Signed   By: AEddie CandleM.D.   On: 07/21/2020 12:39   DG Chest Port 1 View  Result Date: 08/12/2020 CLINICAL DATA:  Shortness of breath EXAM: PORTABLE CHEST 1 VIEW COMPARISON:  07/21/2020, CT 07/09/2020, chest x-ray 08/25/2019 FINDINGS: Chronic elevation of the right diaphragm. Probable small bilateral effusions. Mild cardiomegaly with aortic atherosclerosis. Patchy somewhat peripheral ground-glass opacities within the bilateral lungs. IMPRESSION: 1. Interim patchy peripheral ground-glass opacities within the bilateral lungs, question atypical or viral pneumonia. 2. Mild cardiomegaly with probable trace effusions. Underlying emphysematous disease Electronically Signed   By: KDonavan FoilM.D.   On: 08/21/2020 01:16   DG Chest Port 1 View  Result Date: 07/21/2020 CLINICAL DATA:  evaluate for sepsis. EXAM: PORTABLE CHEST 1 VIEW COMPARISON:  07/08/2020 FINDINGS: Normal  heart size. Unchanged asymmetric elevation of the right hemidiaphragm. Small pleural effusions are noted, right greater than left. Mild diffuse interstitial edema is stable to increased in the interval. IMPRESSION: Bilateral pleural effusions and interstitial edema.  Unchanged. Electronically Signed   By: TKerby MoorsM.D.   On: 07/21/2020 11:20   [place]ABDOMEN LIMITED RUQ (LIVER/GB)  Result Date: 07/21/2020 CLINICAL DATA:  Transaminitis EXAM: ULTRASOUND ABDOMEN LIMITED RIGHT UPPER QUADRANT COMPARISON:  Abdominal ultrasound 06/13/2020 FINDINGS: Gallbladder: No gallstones or wall thickening visualized. No sonographic Murphy sign noted by sonographer. Common bile duct: Diameter: 0.5 cm, within normal limits Liver: No  focal lesion identified. Within normal limits in parenchymal echogenicity. Portal vein is patent on color Doppler imaging with normal direction of blood flow towards the liver. Other: None. IMPRESSION: Unremarkable sonographic exam of the right upper quadrant. Electronically Signed   By: Audie Pinto M.D.   On: 07/21/2020 12:25    Microbiology No results found for this or any previous visit (from the past 240 hour(s)).  Lab Basic Metabolic Panel: Recent Labs  Lab 09/03/2020 0134 08/21/2020 0136  NA 145  --   K 3.2*  --   CL 99  --   CO2 30  --   GLUCOSE 100*  --   BUN 37*  --   CREATININE 1.11*  --   CALCIUM 8.0*  --   MG  --  1.7   Liver Function Tests: Recent Labs  Lab 09/01/2020 0134  AST 150*  ALT 168*  ALKPHOS 500*  BILITOT 2.1*  PROT 5.5*  ALBUMIN 2.9*   No results for input(s): LIPASE, AMYLASE in the last 168 hours. No results for input(s): AMMONIA in the last 168 hours. CBC: Recent Labs  Lab 09/03/2020 0134  WBC 11.6*  NEUTROABS 10.6*  HGB 13.6  HCT 44.0  MCV 90.9  PLT 120*   Cardiac Enzymes: No results for input(s): CKTOTAL, CKMB, CKMBINDEX, TROPONINI in the last 168 hours. Sepsis Labs: Recent Labs  Lab 09/05/2020 0134 09/01/2020 0136  PROCALCITON   --  0.79  WBC 11.6*  --     Procedures/Operations   None    Rufina Falco, DNP, CCRN, FNP-C, AGACNP-BC Acute Care Nurse Practitioner  [facility] Pager: [phone] [place] at [facility]

## 2020-09-06 NOTE — Progress Notes (Signed)
CODE BLUE note:  Date of note: 09-01-20.  CODE BLUE was called on the patient for PEA cardiac arrest.  CPR was immediately started and the patient was bagged.  The patient was given 1 mg of IV epinephrine for 2 doses as well as 1 amp of sodium bicarbonate and wide-open IV normal saline and was intubated by Dr. Leonides Schanz with 7.5 cuffed ET tube via portable glidoscope.  She became bradycardic and was given 1 mg of IV atropine.  She was immediately transferred to the ICU.  Is a central line was being started the patient lost her pulse again and ACLS was followed.  She received another milligram of IV atropine twice and 1 amp of sodium bicarb and and a milligram of IV atropine.  She regained pulse again and was ordered IV Levophed and given persistent bradycardia IV dopamine.  After total of about 40 minutes the patient was in asystole and was pronounced dead.  I discussed with her son over the phone her rapid deterioration and he wanted to keep her full code after initial resuscitation.  The body will be released to the funeral home of the patient's choice.

## 2020-09-06 NOTE — Progress Notes (Signed)
Per Melia pts daughter family are done visiting.

## 2020-09-06 NOTE — Progress Notes (Signed)
   14-Aug-2020 0730  Clinical Encounter Type  Visited With Family  Visit Type Initial;Psychological support;Death  Referral From Family  Consult/Referral To Chaplain   ?Chaplain Mariella Saa was approached by volunteer services that PT's sister could use a visit from a Chaplain. Chaplain entered the [place] with PT's sister who was actively in distress. Her sister passed ICU this morning. She was struggling with a lot of guilt because, "her sister didn't want to die alone." Chaplain supported PT's sister with emotional support and supported her with her lived faith experience. PT's sister stated, "though its hard God does not make mistake. Chaplain ministered with presence and gave space for storytelling, which seemed to be impactful.

## 2020-09-06 NOTE — Procedures (Signed)
Central Venous Catheter Insertion Procedure Note  Raven [name]  [id number]  03-26-1934  Date:08/12/20  Time:7:39 AM   Provider Performing:Bijal Siglin A Adelheid Hoggard   Procedure: Insertion of Non-tunneled Central Venous Catheter(36556) with US guidance (96283)   Indication(s) Medication administration and Difficult access  Consent Unable to obtain consent due to emergent nature of procedure.  Anesthesia no sedation required  Timeout Verified patient identification, verified procedure, site/side was marked, verified correct patient position, special equipment/implants available, medications/allergies/relevant history reviewed, required imaging and test results available.  Sterile Technique Maximal sterile technique including full sterile barrier drape, hand hygiene, sterile gown, sterile gloves, mask, hair covering, sterile ultrasound probe cover (if used).  Procedure Description Area of catheter insertion was cleaned with chlorhexidine and draped in sterile fashion.  With real-time ultrasound guidance a central venous catheter was placed into the left femoral vein. Nonpulsatile blood flow and easy flushing noted in all ports.  The catheter was sutured in place and sterile dressing applied.  Complications/Tolerance None; patient tolerated the procedure well. Chest X-ray is ordered to verify placement for internal jugular or subclavian cannulation.   Chest x-ray is not ordered for femoral cannulation.  EBL Minimal  Specimen(s) None

## 2020-09-06 NOTE — ED Provider Notes (Signed)
[facility] Department of Emergency Medicine   Code Blue CONSULT NOTE  Chief Complaint: Cardiac arrest/unresponsive   Level V Caveat: Unresponsive  History of present illness: I was contacted by the hospital for a CODE BLUE cardiac arrest upstairs and presented to the patient's bedside.  When I arrived at patient's bedside she was actively receiving CPR.  Nursing staff reports patient pulseless, apneic and in PEA.    ROS: Unable to obtain, Level V caveat  Scheduled Meds: . enoxaparin (LOVENOX) injection  40 mg Subcutaneous Q24H  . furosemide  40 mg Intravenous Q12H  . insulin aspart  0-15 Units Subcutaneous TID WC  . insulin aspart  0-5 Units Subcutaneous QHS  . Ipratropium-Albuterol  1 puff Inhalation Q6H  . metoprolol succinate  75 mg Oral Q breakfast  . predniSONE  20 mg Oral Q breakfast   Continuous Infusions: . azithromycin 500 mg (09/03/2020 0428)  . cefTRIAXone (ROCEPHIN)  IV 2 g (09/03/20 0354)   PRN Meds:.acetaminophen **OR** acetaminophen, antiseptic oral rinse, ondansetron **OR** ondansetron (ZOFRAN) IV, traZODone Past Medical History:  Diagnosis Date  . Anemia   . Arthritis   . Asthma   . CHF (congestive heart failure) ([place])   . Complication of anesthesia   . COPD (chronic obstructive pulmonary disease) ([place])   . Crohn's disease ([place])   . Depression    after death of husband  . Hyperlipidemia   . Hypertension   . Myocardial infarction ([place])   . Osteopenia   . Peptic ulcer disease   . Pneumonia   . PONV (postoperative nausea and vomiting)    Past Surgical History:  Procedure Laterality Date  . ABDOMINAL SURGERY    . CARDIAC CATHETERIZATION     with stent  . COLONOSCOPY WITH PROPOFOL N/A 09/27/2014   Procedure: COLONOSCOPY WITH PROPOFOL;  Surgeon: [name], MD;  Location: [facility] ENDOSCOPY;  Service: Gastroenterology;  Laterality: N/A;  . COLONOSCOPY WITH PROPOFOL N/A 06/21/2019   Procedure: COLONOSCOPY WITH PROPOFOL;  Surgeon: Lin Landsman, MD;  Location: [facility] ENDOSCOPY;  Service: Gastroenterology;  Laterality: N/A;  . EYE SURGERY     bilateral cataract surgeries  . SHOULDER SURGERY Right    x 2   . SMALL INTESTINE SURGERY     Social History   Socioeconomic History  . Marital status: Widowed    Spouse name: Not on file  . Number of children: Not on file  . Years of education: Not on file  . Highest education level: Not on file  Occupational History  . Not on file  Tobacco Use  . Smoking status: Former Smoker    Quit date: 1974    Years since quitting: 48.4  . Smokeless tobacco: Never Used  Vaping Use  . Vaping Use: Never used  Substance and Sexual Activity  . Alcohol use: Yes    Comment: occassional  . Drug use: No  . Sexual activity: Never    Birth control/protection: Abstinence  Other Topics Concern  . Not on file  Social History Narrative  . Not on file   Social Determinants of Health   Financial Resource Strain: Not on file  Food Insecurity: Not on file  Transportation Needs: Not on file  Physical Activity: Not on file  Stress: Not on file  Social Connections: Not on file  Intimate Partner Violence: Not on file   Allergies  Allergen Reactions  . Penicillins Other (See Comments)    Reaction:  Unknown  Has patient had a  PCN reaction causing immediate rash, facial/tongue/throat swelling, SOB or lightheadedness with hypotension: unknown Has patient had a PCN reaction causing severe rash involving mucus membranes or skin necrosis: unknown Has patient had a PCN reaction that required hospitalization No Has patient had a PCN reaction occurring within the last 10 years: No If all of the above answers are "NO", then may proceed with Cephalosporin use.   . Augmentin [Amoxicillin-Pot Clavulanate] Other (See Comments)    Reaction:  Unknown   . Indocin [Indomethacin] Other (See Comments)    Reaction:  Unknown   . Lodine [Etodolac] Other (See Comments)    Reaction:   GI upset   .  Methotrexate Derivatives Other (See Comments)    Reaction:  GI upset   . Gold Rash  . Zantac [Ranitidine Hcl] Rash    Last set of Vital Signs (not current) Vitals:   08/19/2020 2103 August 21, 2020 0400  BP: 131/75 117/70  Pulse: 90 96  Resp: 18 18  Temp: 97.8 F (36.6 C) 98.5 F (36.9 C)  SpO2: 90% 97%      Physical Exam  Gen: unresponsive Cardiovascular: pulseless  Resp: apneic. Breath sounds equal bilaterally with bagging  Abd: nondistended  Neuro: GCS 3, unresponsive to pain  HEENT: No blood in posterior pharynx, gag reflex absent  Neck: No crepitus  Musculoskeletal: No deformity  Skin: warm  Procedures (when applicable, including Critical Care time): Procedures   MDM / Assessment and Plan Patient received approximately 7 minutes of CPR, 2 rounds of epinephrine, 1 amp of bicarb and had return of spontaneous circulation.  Blood sugar was 178.  Patient was bradycardic in the 40s.  Given 1 mg of atropine.  Heart rate improved to 61.  Patient taken immediately to ICU where ICU team assumed care of patient for EKG, CXR, central line and pressors.   Cardiopulmonary Resuscitation (CPR) Procedure Note Directed/Performed by: Pryor Curia I personally directed ancillary staff and/or performed CPR in an effort to regain return of spontaneous circulation and to maintain cardiac, neuro and systemic perfusion.   INTUBATION Performed by: Pryor Curia  Required items: required blood products, implants, devices, and special equipment available Patient identity confirmed: provided demographic data and hospital-assigned identification number Time out: Immediately prior to procedure a "time out" was called to verify the correct patient, procedure, equipment, support staff and site/side marked as required.  Indications: Cardiac arrest, respiratory arrest  Intubation method: Direct laryngoscopy   Preoxygenation: BVM  Sedatives: none Paralytic: none  Tube Size: 7.5  cuffed  Post-procedure assessment: chest rise and ETCO2 monitor Breath sounds: equal and absent over the epigastrium Tube secured with: ETT holder Chest x-ray will be obtained and interpreted by ICU provider.  Patient tolerated the procedure well with no immediate complications.   CRITICAL CARE Performed by: Pryor Curia   Total critical care time: 35 minutes  Critical care time was exclusive of separately billable procedures and treating other patients.  Critical care was necessary to treat or prevent imminent or life-threatening deterioration.  Critical care was time spent personally by me on the following activities: development of treatment plan with patient and/or surrogate as well as nursing, discussions with consultants, evaluation of patient's response to treatment, examination of patient, obtaining history from patient or surrogate, ordering and performing treatments and interventions, ordering and review of laboratory studies, ordering and review of radiographic studies, pulse oximetry and re-evaluation of patient's condition.     Mahayla Haddaway, Delice Bison, DO 08-21-20 [phone]

## 2020-09-06 DEATH — deceased

## 2022-06-06 IMAGING — US US EXTREM LOW VENOUS*R*
1 series · 13 of 24 positions shown · non-contrast
Comparison: Contralateral Doppler ultrasound 03/01/2016

CLINICAL DATA: Right lower extremity swelling for 2 days

EXAM:
RIGHT LOWER EXTREMITY VENOUS DOPPLER ULTRASOUND
TECHNIQUE: Gray-scale sonography with compression, as well as color and duplex
ultrasound, were performed to evaluate the deep venous system(s)
from the level of the common femoral vein through the popliteal and
proximal calf veins.

[Series 1: us venous img lower uni right (dvt) · portal-venous · 13 of 33 slices shown]
[im 1/33]
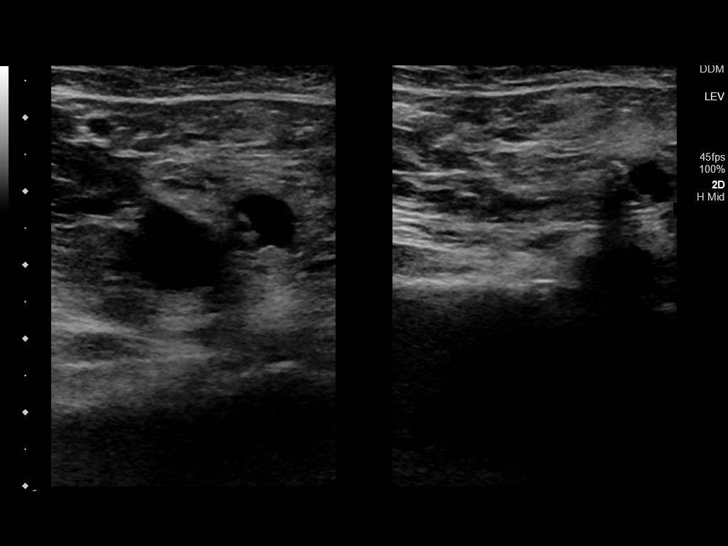
[im 3/33]
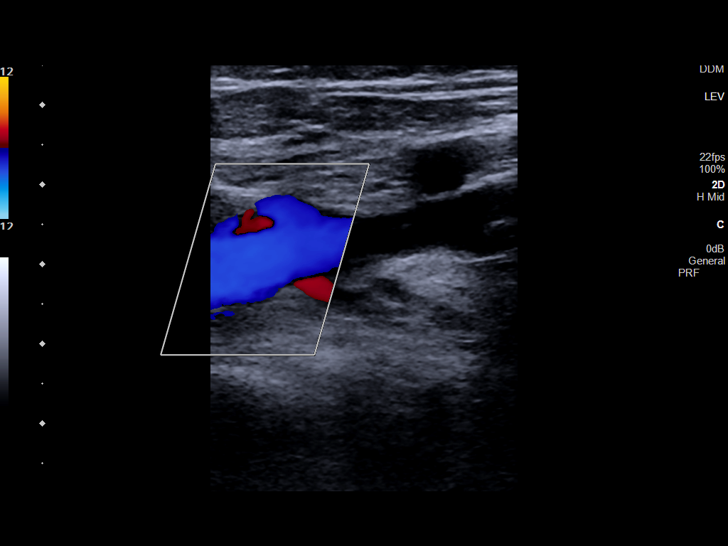
[im 6/33]
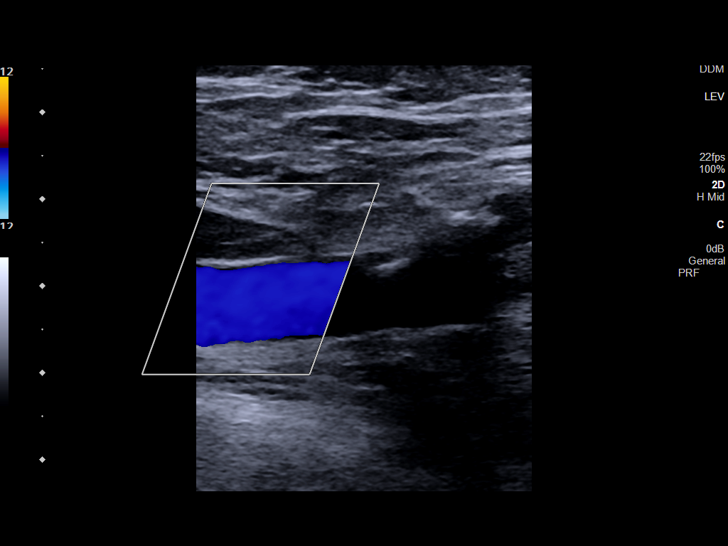
[im 9/33]
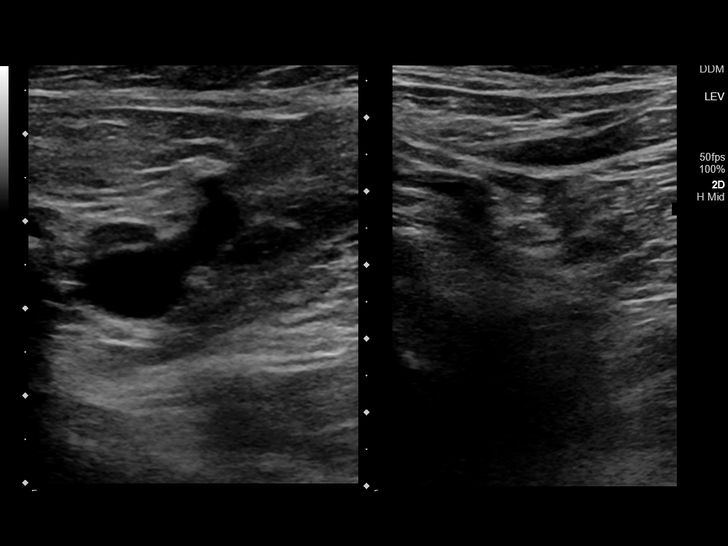
[im 12/33]
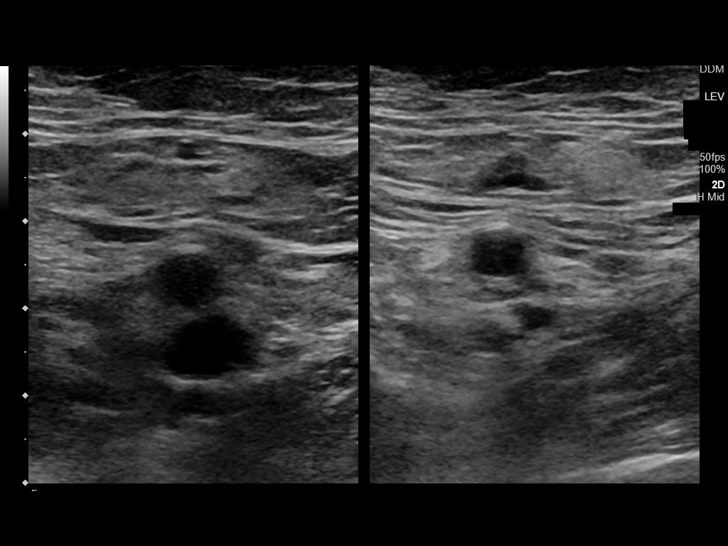
[im 14/33]
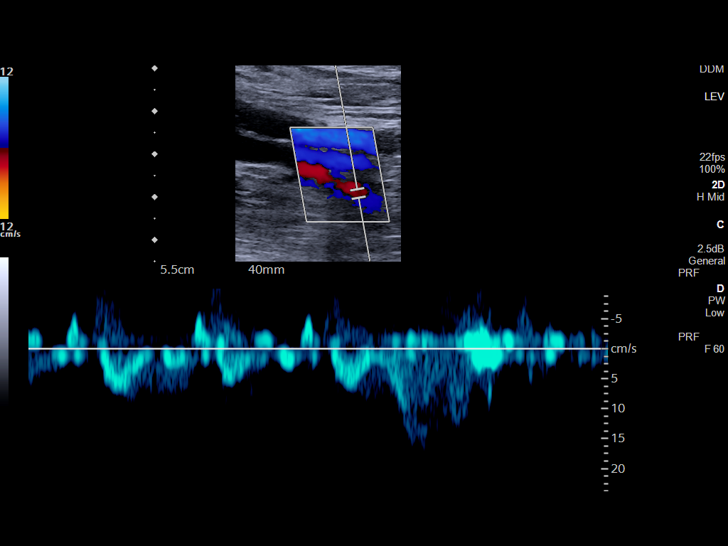
[im 17/33]
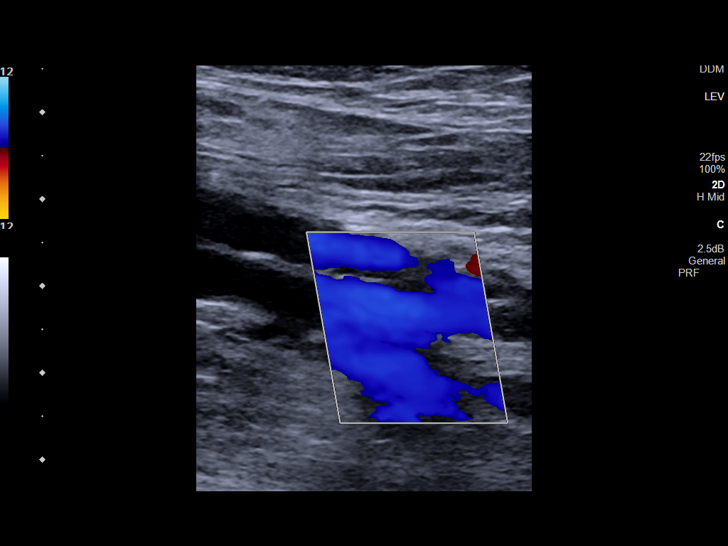
[im 19/33]
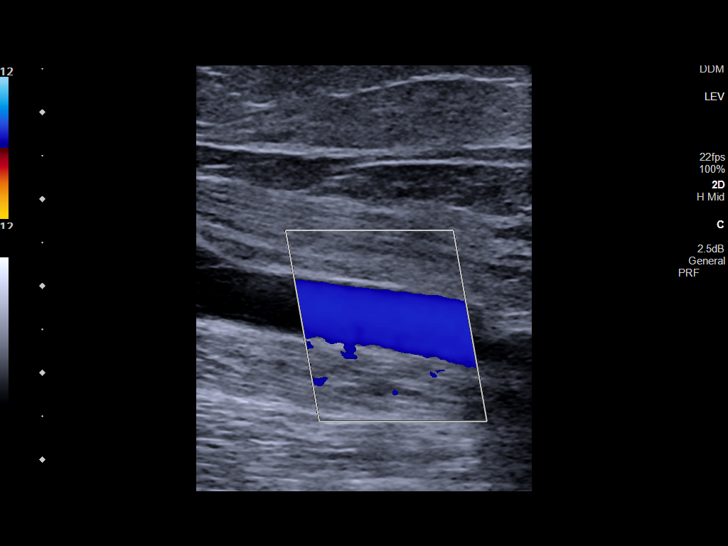
[im 21/33]
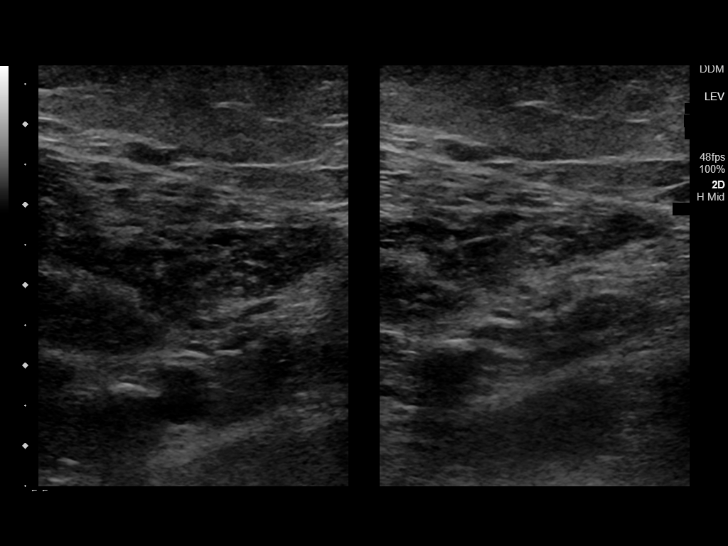
[im 24/33]
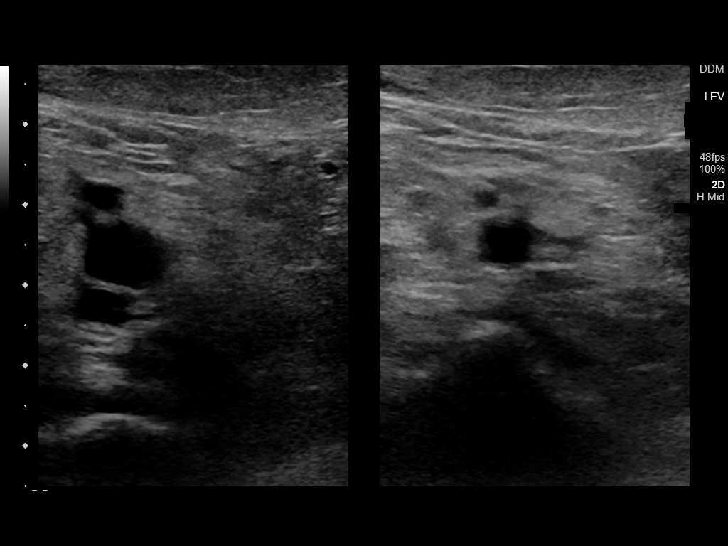
[im 27/33]
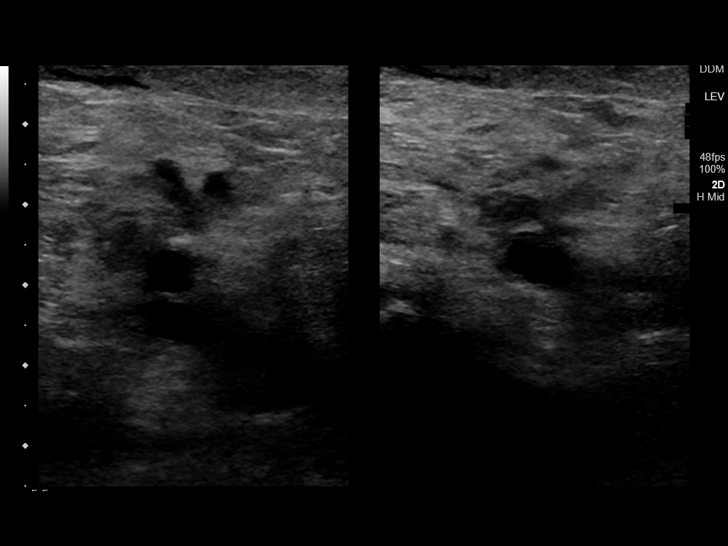
[im 30/33]
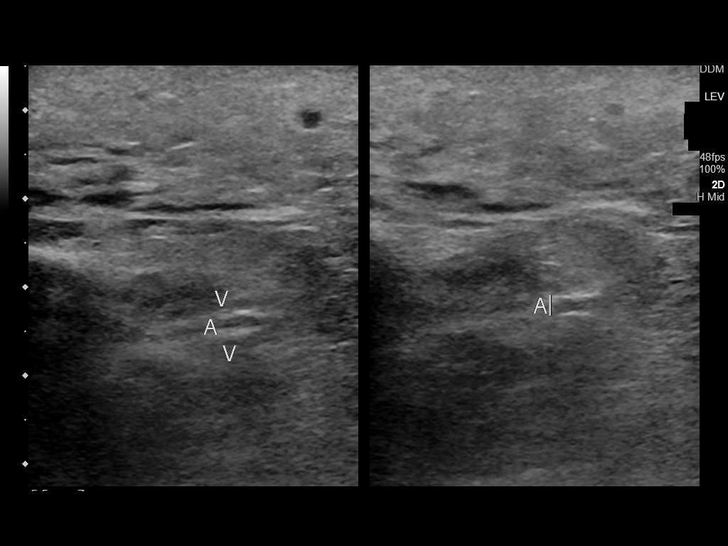
[im 33/33]
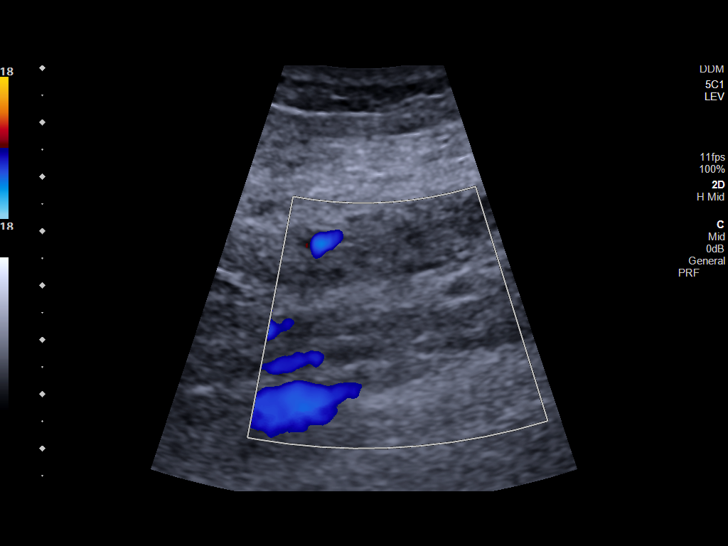

[13 of 24 positions shown; findings below may reference images not displayed]

FINDINGS: VENOUS

Normal compressibility of the common femoral, superficial femoral,
and popliteal veins, as well as the visualized calf veins.
Visualized portions of profunda femoral vein and great saphenous
vein unremarkable. No filling defects to suggest DVT on grayscale or
color Doppler imaging. Doppler waveforms show normal direction of
venous flow, normal respiratory plasticity and response to
augmentation.

Limited views of the contralateral common femoral vein are
unremarkable.

OTHER

None.

Limitations: none
IMPRESSION: No femoropopliteal DVT nor evidence of DVT within the visualized
calf veins. If clinical symptoms are inconsistent or if there are
persistent or worsening symptoms, further imaging (possibly
involving the iliac veins) may be warranted.

## 2023-01-22 IMAGING — NM NM PULMONARY PERF PARTICULATE
1 series · 8 of 8 positions shown · non-contrast
Comparison: CT chest 05/08/2020.

CLINICAL DATA: Suspected.  Negative the diameter.

EXAM:
NUCLEAR MEDICINE PERFUSION LUNG SCAN
TECHNIQUE: Perfusion images were obtained in multiple projections after
intravenous injection of radiopharmaceutical.
Ventilation scans intentionally deferred if perfusion scan and chest
x-ray adequate for interpretation during COVID 19 epidemic.
RADIOPHARMACEUTICALS:  4.41 mCi 2c-JJm MAA IV

[Series 1000: lung perfusion · 1.95mm/px · 4 acquisitions, 8 frames shown]
[im 1/4]
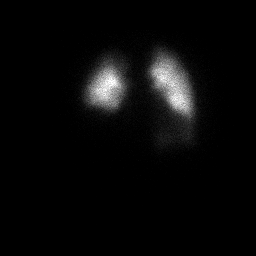
[im 1/4]
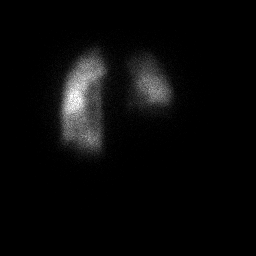
[im 2/4]
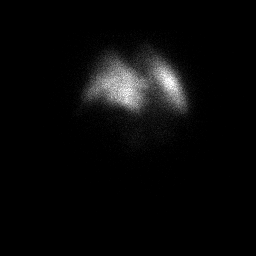
[im 2/4]
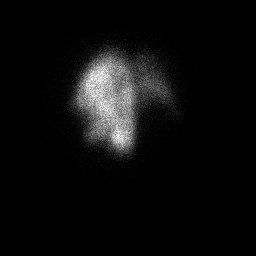
[im 3/4]
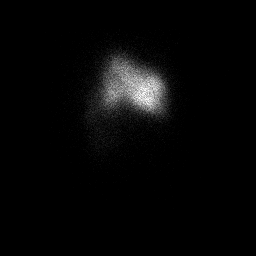
[im 3/4]
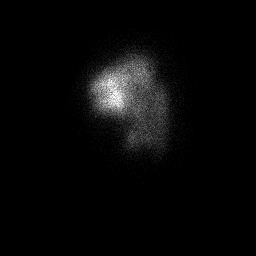
[im 4/4]
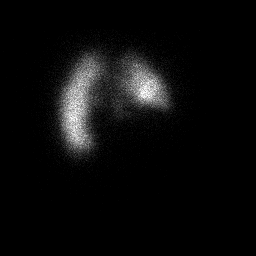
[im 4/4]
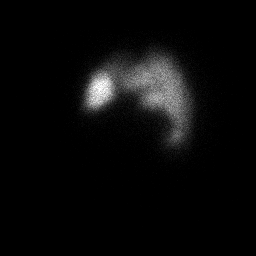

[8 of 8 positions shown; findings below may reference images not displayed]

FINDINGS: Findings consistent elevation right hemidiaphragm again noted.
Probable cardiomegaly. Questionable left base perfusion defect. Scan
is indeterminate pulmonary embolus.
IMPRESSION: Indeterminate scan for pulmonary embolus.
